# Patient Record
Sex: Female | Born: 1945 | Race: Black or African American | Hispanic: No | Smoking: Former smoker
Health system: Southern US, Community
[De-identification: ages and names within clinical notes are randomized; demographics above are authoritative.]

## PROBLEM LIST (undated history)

## (undated) DIAGNOSIS — K219 Gastro-esophageal reflux disease without esophagitis: Secondary | ICD-10-CM

## (undated) DIAGNOSIS — F5102 Adjustment insomnia: Secondary | ICD-10-CM

## (undated) DIAGNOSIS — E785 Hyperlipidemia, unspecified: Secondary | ICD-10-CM

## (undated) DIAGNOSIS — R000 Tachycardia, unspecified: Secondary | ICD-10-CM

## (undated) DIAGNOSIS — R7303 Prediabetes: Secondary | ICD-10-CM

## (undated) DIAGNOSIS — B9689 Other specified bacterial agents as the cause of diseases classified elsewhere: Secondary | ICD-10-CM

## (undated) DIAGNOSIS — N200 Calculus of kidney: Secondary | ICD-10-CM

## (undated) DIAGNOSIS — C50512 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 breast: Secondary | ICD-10-CM

## (undated) DIAGNOSIS — M810 Age-related osteoporosis without current pathological fracture: Secondary | ICD-10-CM

## (undated) DIAGNOSIS — L84 Corns and callosities: Secondary | ICD-10-CM

## (undated) DIAGNOSIS — I700 Atherosclerosis of aorta: Secondary | ICD-10-CM

## (undated) DIAGNOSIS — D1721 Benign lipomatous neoplasm of skin and subcutaneous tissue of right arm: Secondary | ICD-10-CM

## (undated) DIAGNOSIS — F419 Anxiety disorder, unspecified: Secondary | ICD-10-CM

## (undated) DIAGNOSIS — R591 Generalized enlarged lymph nodes: Secondary | ICD-10-CM

## (undated) DIAGNOSIS — I10 Essential (primary) hypertension: Secondary | ICD-10-CM

## (undated) DIAGNOSIS — J302 Other seasonal allergic rhinitis: Secondary | ICD-10-CM

## (undated) DIAGNOSIS — Q669 Congenital deformity of feet, unspecified, unspecified foot: Secondary | ICD-10-CM

## (undated) DIAGNOSIS — M1990 Unspecified osteoarthritis, unspecified site: Secondary | ICD-10-CM

## (undated) DIAGNOSIS — E119 Type 2 diabetes mellitus without complications: Secondary | ICD-10-CM

## (undated) DIAGNOSIS — Z923 Personal history of irradiation: Secondary | ICD-10-CM

## (undated) DIAGNOSIS — Z87442 Personal history of urinary calculi: Secondary | ICD-10-CM

## (undated) DIAGNOSIS — T7840XA Allergy, unspecified, initial encounter: Secondary | ICD-10-CM

## (undated) DIAGNOSIS — K635 Polyp of colon: Secondary | ICD-10-CM

## (undated) DIAGNOSIS — K222 Esophageal obstruction: Secondary | ICD-10-CM

## (undated) DIAGNOSIS — C50919 Malignant neoplasm of unspecified site of unspecified female breast: Secondary | ICD-10-CM

## (undated) HISTORY — DX: Unspecified osteoarthritis, unspecified site: M19.90

## (undated) HISTORY — DX: Allergy, unspecified, initial encounter: T78.40XA

## (undated) HISTORY — PX: INGUINAL HERNIA REPAIR: SUR1180

## (undated) HISTORY — DX: Hyperlipidemia, unspecified: E78.5

## (undated) HISTORY — PX: ABDOMINAL HYSTERECTOMY W/ PARTIAL VAGINACTOMY: SUR660

## (undated) HISTORY — DX: Malignant neoplasm of unspecified site of unspecified female breast: C50.919

## (undated) HISTORY — DX: Type 2 diabetes mellitus without complications: E11.9

## (undated) HISTORY — PX: POLYPECTOMY: SHX149

## (undated) HISTORY — PX: ABDOMINAL HYSTERECTOMY: SHX81

---

## 2006-11-18 ENCOUNTER — Ambulatory Visit: Payer: Self-pay

## 2006-11-27 ENCOUNTER — Ambulatory Visit: Payer: Self-pay

## 2007-06-02 ENCOUNTER — Ambulatory Visit: Payer: Self-pay

## 2007-11-19 ENCOUNTER — Emergency Department: Payer: Self-pay | Admitting: Emergency Medicine

## 2008-02-04 DIAGNOSIS — E7800 Pure hypercholesterolemia, unspecified: Secondary | ICD-10-CM | POA: Insufficient documentation

## 2009-01-31 ENCOUNTER — Ambulatory Visit: Payer: Self-pay

## 2010-05-08 ENCOUNTER — Ambulatory Visit: Payer: Self-pay

## 2011-05-12 ENCOUNTER — Ambulatory Visit: Payer: Self-pay | Admitting: Family Medicine

## 2012-05-27 ENCOUNTER — Ambulatory Visit: Payer: Self-pay | Admitting: Family Medicine

## 2013-04-11 DIAGNOSIS — Z87448 Personal history of other diseases of urinary system: Secondary | ICD-10-CM | POA: Insufficient documentation

## 2013-05-31 ENCOUNTER — Ambulatory Visit: Payer: Self-pay | Admitting: Family Medicine

## 2014-06-01 ENCOUNTER — Ambulatory Visit: Payer: Self-pay | Admitting: Family Medicine

## 2014-06-05 ENCOUNTER — Ambulatory Visit: Payer: Self-pay | Admitting: Family Medicine

## 2014-11-10 DIAGNOSIS — K635 Polyp of colon: Secondary | ICD-10-CM | POA: Insufficient documentation

## 2015-05-03 DIAGNOSIS — L84 Corns and callosities: Secondary | ICD-10-CM | POA: Insufficient documentation

## 2015-05-14 ENCOUNTER — Other Ambulatory Visit: Payer: Self-pay | Admitting: Family Medicine

## 2015-05-14 DIAGNOSIS — Z1231 Encounter for screening mammogram for malignant neoplasm of breast: Secondary | ICD-10-CM

## 2015-07-11 ENCOUNTER — Encounter: Payer: Self-pay | Admitting: *Deleted

## 2015-07-12 ENCOUNTER — Ambulatory Visit
Admission: RE | Admit: 2015-07-12 | Payer: Medicaid Other | Source: Ambulatory Visit | Admitting: Unknown Physician Specialty

## 2015-07-12 ENCOUNTER — Encounter: Admission: RE | Payer: Self-pay | Source: Ambulatory Visit

## 2015-07-12 HISTORY — DX: Essential (primary) hypertension: I10

## 2015-07-12 SURGERY — COLONOSCOPY
Anesthesia: General

## 2015-07-31 ENCOUNTER — Other Ambulatory Visit: Payer: Self-pay | Admitting: Family Medicine

## 2015-07-31 ENCOUNTER — Ambulatory Visit
Admission: RE | Admit: 2015-07-31 | Discharge: 2015-07-31 | Disposition: A | Discharge status: Home / Self Care | Payer: Medicare Other | Source: Ambulatory Visit | Attending: Family Medicine | Admitting: Family Medicine

## 2015-07-31 DIAGNOSIS — Z1231 Encounter for screening mammogram for malignant neoplasm of breast: Secondary | ICD-10-CM | POA: Diagnosis present

## 2015-08-07 ENCOUNTER — Other Ambulatory Visit: Payer: Self-pay | Admitting: Family Medicine

## 2015-08-07 DIAGNOSIS — Z13820 Encounter for screening for osteoporosis: Secondary | ICD-10-CM

## 2015-09-10 ENCOUNTER — Other Ambulatory Visit: Payer: Self-pay | Admitting: Family Medicine

## 2015-09-10 DIAGNOSIS — Z13820 Encounter for screening for osteoporosis: Secondary | ICD-10-CM

## 2015-09-14 ENCOUNTER — Other Ambulatory Visit: Payer: Self-pay | Admitting: Family Medicine

## 2015-09-14 DIAGNOSIS — Z13820 Encounter for screening for osteoporosis: Secondary | ICD-10-CM

## 2015-10-17 ENCOUNTER — Encounter: Payer: Self-pay | Admitting: *Deleted

## 2015-10-18 ENCOUNTER — Ambulatory Visit
Admission: RE | Admit: 2015-10-18 | Discharge: 2015-10-18 | Disposition: A | Discharge status: Home / Self Care | Payer: Medicare Other | Source: Ambulatory Visit | Attending: Unknown Physician Specialty | Admitting: Unknown Physician Specialty

## 2015-10-18 ENCOUNTER — Ambulatory Visit: Payer: Medicare Other | Admitting: Anesthesiology

## 2015-10-18 ENCOUNTER — Encounter: Payer: Self-pay | Admitting: *Deleted

## 2015-10-18 ENCOUNTER — Encounter
Admission: RE | Disposition: A | Discharge status: Home / Self Care | Payer: Self-pay | Source: Ambulatory Visit | Attending: Unknown Physician Specialty

## 2015-10-18 DIAGNOSIS — K640 First degree hemorrhoids: Secondary | ICD-10-CM | POA: Insufficient documentation

## 2015-10-18 DIAGNOSIS — D123 Benign neoplasm of transverse colon: Secondary | ICD-10-CM | POA: Diagnosis not present

## 2015-10-18 DIAGNOSIS — Z79899 Other long term (current) drug therapy: Secondary | ICD-10-CM | POA: Diagnosis not present

## 2015-10-18 DIAGNOSIS — Z86010 Personal history of colonic polyps: Secondary | ICD-10-CM | POA: Diagnosis present

## 2015-10-18 DIAGNOSIS — D122 Benign neoplasm of ascending colon: Secondary | ICD-10-CM | POA: Diagnosis not present

## 2015-10-18 DIAGNOSIS — I10 Essential (primary) hypertension: Secondary | ICD-10-CM | POA: Insufficient documentation

## 2015-10-18 DIAGNOSIS — K5730 Diverticulosis of large intestine without perforation or abscess without bleeding: Secondary | ICD-10-CM | POA: Diagnosis not present

## 2015-10-18 HISTORY — DX: Other seasonal allergic rhinitis: J30.2

## 2015-10-18 HISTORY — PX: COLONOSCOPY WITH PROPOFOL: SHX5780

## 2015-10-18 SURGERY — COLONOSCOPY WITH PROPOFOL
Anesthesia: General

## 2015-10-18 MED ORDER — PROPOFOL 10 MG/ML IV BOLUS
INTRAVENOUS | Status: DC | PRN
  Administered 2015-10-18: 10 mg via INTRAVENOUS
  Administered 2015-10-18: 30 mg via INTRAVENOUS

## 2015-10-18 MED ORDER — MIDAZOLAM HCL 5 MG/5ML IJ SOLN
INTRAMUSCULAR | Status: DC | PRN
  Administered 2015-10-18: 1 mg via INTRAVENOUS
  Administered 2015-10-18 (×2): 0.5 mg via INTRAVENOUS

## 2015-10-18 MED ORDER — SODIUM CHLORIDE 0.9 % IV SOLN
INTRAVENOUS | Status: DC
  Administered 2015-10-18: 1000 mL via INTRAVENOUS

## 2015-10-18 MED ORDER — SODIUM CHLORIDE 0.9 % IV SOLN: INTRAVENOUS | Status: DC

## 2015-10-18 MED ORDER — EPHEDRINE SULFATE 50 MG/ML IJ SOLN
INTRAMUSCULAR | Status: DC | PRN
  Administered 2015-10-18: 5 mg via INTRAVENOUS
  Administered 2015-10-18: 10 mg via INTRAVENOUS

## 2015-10-18 MED ORDER — PROPOFOL 500 MG/50ML IV EMUL
INTRAVENOUS | Status: DC | PRN
  Administered 2015-10-18: 100 ug/kg/min via INTRAVENOUS

## 2015-10-18 MED ORDER — LIDOCAINE HCL (PF) 2 % IJ SOLN
INTRAMUSCULAR | Status: DC | PRN
  Administered 2015-10-18: 50 mg

## 2015-10-18 MED ORDER — FENTANYL CITRATE (PF) 100 MCG/2ML IJ SOLN
INTRAMUSCULAR | Status: DC | PRN
  Administered 2015-10-18: 50 ug via INTRAVENOUS

## 2015-10-18 NOTE — H&P (Signed)
   Primary Care Physician:  Marguerita Merles, MD Primary Gastroenterologist:  Dr. Vira Agar  Pre-Procedure History & Physical: HPI:  Virginia Warner is a 69 y.o. female is here for an colonoscopy.   Past Medical History  Diagnosis Date  . Hypertension   . Seasonal allergies     Past Surgical History  Procedure Laterality Date  . Abdominal hysterectomy    . Polypectomy      Prior to Admission medications   Medication Sig Start Date End Date Taking? Authorizing Provider  amLODipine (NORVASC) 5 MG tablet Take 5 mg by mouth daily.   Yes Historical Provider, MD  aspirin EC 81 MG tablet Take 81 mg by mouth daily.   Yes Historical Provider, MD  fluticasone (FLONASE) 50 MCG/ACT nasal spray Place into both nostrils daily.   Yes Historical Provider, MD  geriatric multivitamins-minerals (ELDERTONIC/GEVRABON) ELIX Take 15 mLs by mouth daily.   Yes Historical Provider, MD  losartan-hydrochlorothiazide (HYZAAR) 50-12.5 MG per tablet Take 1 tablet by mouth daily.   Yes Historical Provider, MD    Allergies as of 08/20/2015 - Review Complete 07/31/2015  Allergen Reaction Noted  . Aspirin  07/11/2015  . Atorvastatin  07/11/2015    History reviewed. No pertinent family history.  Social History   Social History  . Marital Status: Divorced    Spouse Name: N/A  . Number of Children: N/A  . Years of Education: N/A   Occupational History  . Not on file.   Social History Main Topics  . Smoking status: Not on file  . Smokeless tobacco: Not on file  . Alcohol Use: Not on file  . Drug Use: Not on file  . Sexual Activity: Not on file   Other Topics Concern  . Not on file   Social History Narrative    Review of Systems: See HPI, otherwise negative ROS  Physical Exam: BP 141/76 mmHg  Pulse 105  Temp(Src) 99.5 F (37.5 C) (Tympanic)  Resp 16  Ht 5\' 5"  (1.651 m)  Wt 70.308 kg (155 lb)  BMI 25.79 kg/m2  SpO2 98% General:   Alert,  pleasant and cooperative in NAD Head:  Normocephalic  and atraumatic. Neck:  Supple; no masses or thyromegaly. Lungs:  Clear throughout to auscultation.    Heart:  Regular rate and rhythm. Abdomen:  Soft, nontender and nondistended. Normal bowel sounds, without guarding, and without rebound.   Neurologic:  Alert and  oriented x4;  grossly normal neurologically.  Impression/Plan: Virginia Warner is here for an colonoscopy to be performed for [facility] colon polyps  Risks, benefits, limitations, and alternatives regarding  colonoscopy have been reviewed with the patient.  Questions have been answered.  All parties agreeable.   Gaylyn Cheers, MD  10/18/2015, 2:03 PM

## 2015-10-18 NOTE — Anesthesia Preprocedure Evaluation (Signed)
Anesthesia Evaluation  Patient identified by MRN, date of birth, ID band Patient awake    Reviewed: Allergy & Precautions, H&P , NPO status , Patient's Chart, lab work & pertinent test results  History of Anesthesia Complications Negative for: history of anesthetic complications  Airway Mallampati: III  TM Distance: >3 FB Neck ROM: limited    Dental  (+) Poor Dentition, Missing, Upper Dentures   Pulmonary neg pulmonary ROS,    Pulmonary exam normal breath sounds clear to auscultation       Cardiovascular Exercise Tolerance: Good hypertension, (-) angina(-) Past MI and (-) DOE Normal cardiovascular exam Rhythm:regular Rate:Normal     Neuro/Psych negative neurological ROS  negative psych ROS   GI/Hepatic negative GI ROS, Neg liver ROS, neg GERD  ,  Endo/Other  negative endocrine ROS  Renal/GU negative Renal ROS  negative genitourinary   Musculoskeletal   Abdominal   Peds  Hematology negative hematology ROS (+)   Anesthesia Other Findings Past Medical History:   Hypertension                                                 Seasonal allergies                                          Past Surgical History:   ABDOMINAL HYSTERECTOMY                                        POLYPECTOMY                                                  BMI    Body Mass Index   25.79 kg/m 2      Reproductive/Obstetrics negative OB ROS                             Anesthesia Physical Anesthesia Plan  ASA: III  Anesthesia Plan: General   Post-op Pain Management:    Induction:   Airway Management Planned:   Additional Equipment:   Intra-op Plan:   Post-operative Plan:   Informed Consent: I have reviewed the patients History and Physical, chart, labs and discussed the procedure including the risks, benefits and alternatives for the proposed anesthesia with the patient or authorized representative who  has indicated his/her understanding and acceptance.   Dental Advisory Given  Plan Discussed with: Anesthesiologist, CRNA and Surgeon  Anesthesia Plan Comments:         Anesthesia Quick Evaluation

## 2015-10-18 NOTE — Anesthesia Postprocedure Evaluation (Signed)
  Anesthesia Post-op Note  Patient: Virginia Warner  Procedure(s) Performed: Procedure(s): COLONOSCOPY WITH PROPOFOL (N/A)  Anesthesia type:General  Patient location: PACU  Post pain: Pain level controlled  Post assessment: Post-op Vital signs reviewed, Patient's Cardiovascular Status Stable, Respiratory Function Stable, Patent Airway and No signs of Nausea or vomiting  Post vital signs: Reviewed and stable  Last Vitals:  Filed Vitals:   10/18/15 1510  BP: 106/63  Pulse: 81  Temp:   Resp: 13    Level of consciousness: awake, alert  and patient cooperative  Complications: No apparent anesthesia complications

## 2015-10-18 NOTE — Transfer of Care (Signed)
Immediate Anesthesia Transfer of Care Note  Patient: Virginia Warner  Procedure(s) Performed: Procedure(s): COLONOSCOPY WITH PROPOFOL (N/A)  Patient Location: PACU  Anesthesia Type:General  Level of Consciousness: sedated  Airway & Oxygen Therapy: Patient Spontanous Breathing and Patient connected to nasal cannula oxygen  Post-op Assessment: Report given to RN and Post -op Vital signs reviewed and stable  Post vital signs: Reviewed and stable  Last Vitals:  Filed Vitals:   10/18/15 1311  BP: 141/76  Pulse: 105  Temp: 37.5 C  Resp: 16    Complications: No apparent anesthesia complications

## 2015-10-18 NOTE — Op Note (Signed)
[facility] Gastroenterology Patient Name: Virginia Warner Procedure Date: 10/18/2015 2:03 PM MRN: [id number] Account #: [id number] Date of Birth: September 30, 1946 Admit Type: Outpatient Age: 69 Room: [facility] ENDO ROOM 1 Gender: Female Note Status: Finalized Procedure:         Colonoscopy Indications:       High risk colon cancer surveillance: Personal history of                     colonic polyps Providers:         Manya Silvas, MD Referring MD:      Marguerita Merles, MD (Referring MD) Medicines:         Propofol per Anesthesia Complications:     No immediate complications. Procedure:         Pre-Anesthesia Assessment:                    - After reviewing the risks and benefits, the patient was                     deemed in satisfactory condition to undergo the procedure.                    After obtaining informed consent, the colonoscope was                     passed under direct vision. Throughout the procedure, the                     patient's blood pressure, pulse, and oxygen saturations                     were monitored continuously. The Olympus PCF-H180AL                     colonoscope ( S#: [id number] ) was introduced through the                     anus and advanced to the the cecum, identified by                     appendiceal orifice and ileocecal valve. The colonoscopy                     was performed without difficulty. The patient tolerated                     the procedure well. The quality of the bowel preparation                     was good. Findings:      Two sessile polyps were found in the ascending colon. The polyps were       small in size. These polyps were removed with a jumbo cold forceps.       Resection and retrieval were complete.      A diminutive polyp was found at the hepatic flexure. The polyp was       sessile. The polyp was removed with a jumbo cold forceps. Resection and       retrieval were complete.      Many small-mouthed  diverticula were found in the sigmoid colon and in       the descending colon.      Internal hemorrhoids were found during endoscopy. The hemorrhoids were  small and Grade I (internal hemorrhoids that do not prolapse).      The exam was otherwise without abnormality. Impression:        - Two small polyps in the ascending colon. Resected and                     retrieved.                    - One diminutive polyp at the hepatic flexure. Resected                     and retrieved.                    - Diverticulosis in the sigmoid colon and in the                     descending colon.                    - Internal hemorrhoids.                    - The examination was otherwise normal. Recommendation:    - Await pathology results. Manya Silvas, MD 10/18/2015 2:39:35 PM This report has been signed electronically. Number of Addenda: 0 Note Initiated On: 10/18/2015 2:03 PM Scope Withdrawal Time: 0 hours 13 minutes 17 seconds  Total Procedure Duration: 0 hours 27 minutes 59 seconds       [facility]

## 2015-10-19 ENCOUNTER — Encounter: Payer: Self-pay | Admitting: Unknown Physician Specialty

## 2015-10-22 LAB — SURGICAL PATHOLOGY

## 2015-12-12 ENCOUNTER — Other Ambulatory Visit: Payer: Self-pay | Admitting: Family Medicine

## 2015-12-12 DIAGNOSIS — Z13820 Encounter for screening for osteoporosis: Secondary | ICD-10-CM

## 2016-01-04 DIAGNOSIS — Q6689 Other  specified congenital deformities of feet: Secondary | ICD-10-CM | POA: Insufficient documentation

## 2016-02-05 ENCOUNTER — Encounter: Payer: Self-pay | Admitting: Sports Medicine

## 2016-02-05 ENCOUNTER — Ambulatory Visit (INDEPENDENT_AMBULATORY_CARE_PROVIDER_SITE_OTHER): Payer: Medicare Other | Admitting: Sports Medicine

## 2016-02-05 DIAGNOSIS — L909 Atrophic disorder of skin, unspecified: Secondary | ICD-10-CM

## 2016-02-05 DIAGNOSIS — R29898 Other symptoms and signs involving the musculoskeletal system: Secondary | ICD-10-CM

## 2016-02-05 DIAGNOSIS — L603 Nail dystrophy: Secondary | ICD-10-CM | POA: Diagnosis not present

## 2016-02-05 DIAGNOSIS — M898X9 Other specified disorders of bone, unspecified site: Secondary | ICD-10-CM | POA: Diagnosis not present

## 2016-02-05 NOTE — Progress Notes (Signed)
Patient ID: Virginia Warner, female   DOB: Jan 25, 1946, 70 y.o.   MRN: [id number] Subjective: Virginia Warner is a 70 y.o. female patient seen today in office for advice on appearance of bilateral hallux nails, right foot bump, and fat pad feeling like its bunching up that is painless or symptom free. Patient denies history of Diabetes, Neuropathy, or Vascular disease. Patient has no other pedal complaints at this time.   Review of Systems  All other systems reviewed and are negative.  There are no active problems to display for this patient.  Current Outpatient Prescriptions on File Prior to Visit  Medication Sig Dispense Refill  . amLODipine (NORVASC) 5 MG tablet Take 5 mg by mouth daily.    Marland Kitchen aspirin EC 81 MG tablet Take 81 mg by mouth daily.    . fluticasone (FLONASE) 50 MCG/ACT nasal spray Place into both nostrils daily.    Marland Kitchen geriatric multivitamins-minerals (ELDERTONIC/GEVRABON) ELIX Take 15 mLs by mouth daily.    Marland Kitchen losartan-hydrochlorothiazide (HYZAAR) 50-12.5 MG per tablet Take 1 tablet by mouth daily.     No current facility-administered medications on file prior to visit.   Allergies  Allergen Reactions  . Aspirin Other (See Comments)    Cannot tolerate unless enteric coated  . Atorvastatin Other (See Comments)   Objective: Physical Exam  General: Well developed, nourished, no acute distress, awake, alert and oriented x 3  Vascular: Dorsalis pedis artery 2/4 bilateral, Posterior tibial artery 2/4 bilateral, skin temperature warm to warm proximal to distal bilateral lower extremities, no varicosities, pedal hair present bilateral.  Neurological: Gross sensation present via light touch bilateral.   Dermatological: Skin is warm, dry, and supple bilateral, Nails 1-10 are short mildly thick, and discolored with mild subungal debris at bilateral hallux nails, all other nails within normal limits, no webspace macerations present bilateral, no open lesions present bilateral, no  callus/corns/hyperkeratotic tissue present bilateral. No signs of infection bilateral.  Musculoskeletal: + midfoot bony exostosis on right with no pain to palpation and fat pad atrophy bilateral that is also asymptomatic. Muscular strength within normal limits without pain or limitation on range of motion. No pain with calf compression bilateral.  Assessment and Plan:  Problem List Items Addressed This Visit    None    Visit Diagnoses    Nail dystrophy    -  Primary    Fat pad atrophy of foot        asymptomatic    Bony exostosis        Right dorsal midfoot      -Examined patient.  -Discussed treatment options for dystrophic nails; patient states that she just wanted to make sure that it's not harmful and wants to continue with pedicures. Advised patient to be careful and to choose a spa that is sanitary and with trained technicians  -Recommend good supportive shoes and inserts daily for foot type and alternate lacing to avoid irritation at exostosis site on right -Patient to return in as needed for follow up evaluation or sooner if symptoms worsen.  Landis Martins, DPM

## 2016-02-05 NOTE — Progress Notes (Deleted)
   Subjective:    Patient ID: Virginia Warner, female    DOB: Sep 09, 1946, 70 y.o.   MRN: [id number]  HPI    Review of Systems  All other systems reviewed and are negative.      Objective:   Physical Exam        Assessment & Plan:

## 2016-02-05 NOTE — Patient Instructions (Signed)
[employer]

## 2016-07-16 ENCOUNTER — Other Ambulatory Visit: Payer: Self-pay | Admitting: Family Medicine

## 2016-07-16 DIAGNOSIS — Z1231 Encounter for screening mammogram for malignant neoplasm of breast: Secondary | ICD-10-CM

## 2016-07-31 ENCOUNTER — Ambulatory Visit: Payer: Medicare Other

## 2016-08-15 ENCOUNTER — Other Ambulatory Visit: Payer: Self-pay | Admitting: Family Medicine

## 2016-08-15 ENCOUNTER — Ambulatory Visit
Admission: RE | Admit: 2016-08-15 | Discharge: 2016-08-15 | Disposition: A | Discharge status: Home / Self Care | Payer: Medicare Other | Source: Ambulatory Visit | Attending: Family Medicine | Admitting: Family Medicine

## 2016-08-15 DIAGNOSIS — Z1231 Encounter for screening mammogram for malignant neoplasm of breast: Secondary | ICD-10-CM | POA: Insufficient documentation

## 2017-02-03 DIAGNOSIS — Z1389 Encounter for screening for other disorder: Secondary | ICD-10-CM | POA: Diagnosis not present

## 2017-02-03 DIAGNOSIS — Z0000 Encounter for general adult medical examination without abnormal findings: Secondary | ICD-10-CM | POA: Diagnosis not present

## 2017-02-04 ENCOUNTER — Other Ambulatory Visit: Payer: Self-pay | Admitting: Family Medicine

## 2017-02-04 DIAGNOSIS — Z13820 Encounter for screening for osteoporosis: Secondary | ICD-10-CM

## 2017-03-05 DIAGNOSIS — I10 Essential (primary) hypertension: Secondary | ICD-10-CM | POA: Diagnosis not present

## 2017-03-05 DIAGNOSIS — E782 Mixed hyperlipidemia: Secondary | ICD-10-CM | POA: Diagnosis not present

## 2017-03-17 ENCOUNTER — Ambulatory Visit: Payer: Medicare Other

## 2017-03-17 DIAGNOSIS — H40153 Residual stage of open-angle glaucoma, bilateral: Secondary | ICD-10-CM | POA: Diagnosis not present

## 2017-04-27 ENCOUNTER — Other Ambulatory Visit: Payer: Medicare Other

## 2017-07-09 DIAGNOSIS — I10 Essential (primary) hypertension: Secondary | ICD-10-CM | POA: Diagnosis not present

## 2017-07-09 DIAGNOSIS — E782 Mixed hyperlipidemia: Secondary | ICD-10-CM | POA: Diagnosis not present

## 2017-07-30 ENCOUNTER — Other Ambulatory Visit: Payer: Self-pay | Admitting: Family Medicine

## 2017-07-30 DIAGNOSIS — Z1231 Encounter for screening mammogram for malignant neoplasm of breast: Secondary | ICD-10-CM

## 2017-08-04 ENCOUNTER — Ambulatory Visit: Payer: Medicare Other

## 2017-08-24 ENCOUNTER — Encounter: Payer: Self-pay | Admitting: Medical Oncology

## 2017-08-24 ENCOUNTER — Emergency Department
Admission: EM | Admit: 2017-08-24 | Discharge: 2017-08-24 | Disposition: A | Discharge status: Home / Self Care | Payer: Medicare Other | Attending: Emergency Medicine | Admitting: Emergency Medicine

## 2017-08-24 DIAGNOSIS — I10 Essential (primary) hypertension: Secondary | ICD-10-CM | POA: Insufficient documentation

## 2017-08-24 DIAGNOSIS — J0100 Acute maxillary sinusitis, unspecified: Secondary | ICD-10-CM | POA: Diagnosis not present

## 2017-08-24 DIAGNOSIS — Z79899 Other long term (current) drug therapy: Secondary | ICD-10-CM | POA: Diagnosis not present

## 2017-08-24 DIAGNOSIS — R040 Epistaxis: Secondary | ICD-10-CM | POA: Diagnosis not present

## 2017-08-24 DIAGNOSIS — Z7982 Long term (current) use of aspirin: Secondary | ICD-10-CM | POA: Diagnosis not present

## 2017-08-24 MED ORDER — AZITHROMYCIN 250 MG PO TABS: ORAL_TABLET | ORAL | 0 refills | Status: AC

## 2017-08-24 NOTE — ED Provider Notes (Signed)
[facility] Emergency Department Provider Note   ____________________________________________   I have reviewed the triage vital signs and the nursing notes.   HISTORY  Chief Complaint Epistaxis     Virginia Warner is a 71 y.o. female Presents to the emergency department with maxillary sinus pressure and left nostril nosebleed. Patient reports using Flonase regularly. She notes since beginning Flonase the nostrils have become dry and  Irritated which resulted in more frequent nosebleeds. Over the last day patient has transitioned to saline nasal spray. Patient denies being on aspirin or blood thinners contributing to nosebleeds. Patient has a long history of seasonal allergies. Patient denies fever, chills, headache, vision changes, chest pain, chest tightness, shortness of breath, abdominal pain, nausea and vomiting.  Past Medical History:  Diagnosis Date  . Hypertension   . Seasonal allergies     There are no active problems to display for this patient.   Past Surgical History:  Procedure Laterality Date  . ABDOMINAL HYSTERECTOMY    . COLONOSCOPY WITH PROPOFOL N/A 10/18/2015   Procedure: COLONOSCOPY WITH PROPOFOL;  Surgeon: Manya Silvas, MD;  Location: [facility] ENDOSCOPY;  Service: Endoscopy;  Laterality: N/A;  . POLYPECTOMY      Prior to Admission medications   Medication Sig Start Date End Date Taking? Authorizing Provider  amLODipine (NORVASC) 5 MG tablet Take 5 mg by mouth daily.    [provider]  aspirin EC 81 MG tablet Take 81 mg by mouth daily.    [provider]  azithromycin (ZITHROMAX Z-PAK) 250 MG tablet Take 2 tablets (500 mg) on  Day 1,  followed by 1 tablet (250 mg) once daily on Days 2 through 5. 08/24/17 08/29/17  Meena Barrantes M, PA-C  fluticasone (FLONASE) 50 MCG/ACT nasal spray Place into both nostrils daily.    [provider]  geriatric multivitamins-minerals (ELDERTONIC/GEVRABON) ELIX Take 15 mLs by  mouth daily.    [provider]  losartan-hydrochlorothiazide (HYZAAR) 50-12.5 MG per tablet Take 1 tablet by mouth daily.    [provider]  polyethylene glycol powder (GLYCOLAX/MIRALAX) powder Take as directed for colon prep 01/29/15   [provider]    Allergies Aspirin and Atorvastatin  Family History  Problem Relation Age of Onset  . Cancer Brother     Social History Social History  Substance Use Topics  . Smoking status: Unknown If Ever Smoked  . Smokeless tobacco: Never Used  . Alcohol use Not on file    Review of Systems Constitutional: Negative for fever/chills Eyes: No visual changes. ENT:  Negative for sore throat and for difficulty swallowing. Left [place] nosebleed. Cardiovascular: Denies chest pain. Respiratory: Denies cough. Denies shortness of breath. Gastrointestinal: No abdominal pain.  No nausea, vomiting, diarrhea. Genitourinary: Negative for dysuria. Musculoskeletal: Negative for back pain. Skin: Negative for rash. Neurological: Negative for headaches.  Negative focal weakness or numbness. Negative for loss of consciousness. Able to ambulate. ____________________________________________   PHYSICAL EXAM:  VITAL SIGNS: ED Triage Vitals  Enc Vitals Group     BP 08/24/17 0847 (!) 168/97     Pulse Rate 08/24/17 0847 (!) 110     Resp 08/24/17 0847 18     Temp 08/24/17 0847 98.8 F (37.1 C)     Temp Source 08/24/17 0847 Oral     SpO2 08/24/17 0847 99 %     Weight 08/24/17 0848 140 lb (63.5 kg)     Height 08/24/17 0848 5\' 5"  (1.651 m)  Head Circumference --      Peak Flow --      Pain Score 08/24/17 1112 0     Pain Loc --      Pain Edu? --      Excl. in [place]? --     Constitutional: Alert and oriented. Well appearing and in no acute distress.  Eyes: Conjunctivae are normal. PERRL. EOMI  Head: Normocephalic and atraumatic. ENT:      Ears: Canals clear. TMs intact bilaterally.      Nose: No congestion/rhinnorhea. Left  nostril with dried blood. Mucosa erythematous and abraded from recent bleeding. Now hemostatic      Mouth/Throat: Mucous membranes are moist.  Neck:Supple. No thyromegaly. No stridor.  Cardiovascular: Normal rate, regular rhythm. Normal S1 and S2.  Good peripheral circulation. Respiratory: Normal respiratory effort without tachypnea or retractions. Lungs CTAB. No wheezes/rales/rhonchi. Good air entry to the bases with no decreased or absent breath sounds. Hematological/Lymphatic/Immunological: No cervical lymphadenopathy. Cardiovascular: Normal rate, regular rhythm. Normal distal pulses. Gastrointestinal: Bowel sounds 4 quadrants. Soft and nontender to palpation. No guarding or rigidity. No palpable masses. No distention. No CVA tenderness. Musculoskeletal: Nontender with normal range of motion in all extremities. Neurologic: Normal speech and language.  Skin:  Skin is warm, dry and intact. No rash noted. Psychiatric: Mood and affect are normal. Speech and behavior are normal. Patient exhibits appropriate insight and judgement.  ____________________________________________   LABS (all labs ordered are listed, but only abnormal results are displayed)  Labs Reviewed - No data to display ____________________________________________  EKG none ____________________________________________  RADIOLOGY none ____________________________________________   PROCEDURES  Procedure(s) performed: no    Critical Care performed: no ____________________________________________   INITIAL IMPRESSION / ASSESSMENT AND PLAN / ED COURSE  Pertinent labs & imaging results that were available during my care of the patient were reviewed by me and considered in my medical decision making (see chart for details).  Patient presents to emergency department with left nostril epistaxis and maxillary sinus pressure. History and physical exam findings are reassuring no further bleeding is present. Symptoms  are consistent with nasal irritation from nasal sinus products, in addition to, maxillary sinus pressure associated likely sinusitis. Patient will be given a prescription for azithromycin for an about a coverage and encourage to continue saline nasal to moisturize the nasal mucosa. Patient informed of clinical course, understand medical decision-making process, and agree with plan. Patient was advised to follow up with PCP and was also advised to return to the emergency department for symptoms that change or worsen.    ____________________________________________   FINAL CLINICAL IMPRESSION(S) / ED DIAGNOSES  Final diagnoses:  Epistaxis  Acute maxillary sinusitis, recurrence not specified       NEW MEDICATIONS STARTED DURING THIS VISIT:  Discharge Medication List as of 08/24/2017 11:06 AM    START taking these medications   Details  azithromycin (ZITHROMAX Z-PAK) 250 MG tablet Take 2 tablets (500 mg) on  Day 1,  followed by 1 tablet (250 mg) once daily on Days 2 through 5., Print         Note:  This document was prepared using Dragon voice recognition software and may include unintentional dictation errors.    Alric Quan 08/24/17 1630    Lavonia Drafts, MD 08/25/17 [phone]

## 2017-08-24 NOTE — ED Notes (Signed)
See triage note  States she has been having sinus drainage and pressure for several days  States she use her nasal spray and developed a nose bleed  No bleeding at present

## 2017-08-24 NOTE — ED Triage Notes (Signed)
Pt reports she has been having lots of sinus drainage and this am she used a nasal spray and afterwards had a left sided nose bleed. Bleeding has now stopped. Pt denies use of blood thinner.

## 2017-08-24 NOTE — Discharge Instructions (Signed)
Take medication as prescribed. Return to emergency department if symptoms worsen and follow-up with PCP as needed.   °

## 2017-09-14 DIAGNOSIS — E782 Mixed hyperlipidemia: Secondary | ICD-10-CM | POA: Diagnosis not present

## 2017-09-14 DIAGNOSIS — I10 Essential (primary) hypertension: Secondary | ICD-10-CM | POA: Diagnosis not present

## 2017-09-15 ENCOUNTER — Other Ambulatory Visit: Payer: Medicare Other

## 2017-09-23 DIAGNOSIS — H40153 Residual stage of open-angle glaucoma, bilateral: Secondary | ICD-10-CM | POA: Diagnosis not present

## 2017-10-21 ENCOUNTER — Other Ambulatory Visit: Payer: Medicare Other

## 2017-10-23 DIAGNOSIS — Z23 Encounter for immunization: Secondary | ICD-10-CM | POA: Diagnosis not present

## 2017-11-23 ENCOUNTER — Other Ambulatory Visit: Payer: Medicare Other

## 2017-11-23 ENCOUNTER — Ambulatory Visit
Admission: RE | Admit: 2017-11-23 | Discharge: 2017-11-23 | Disposition: A | Discharge status: Home / Self Care | Payer: Medicare Other | Source: Ambulatory Visit | Attending: Family Medicine | Admitting: Family Medicine

## 2017-11-23 DIAGNOSIS — Z1231 Encounter for screening mammogram for malignant neoplasm of breast: Secondary | ICD-10-CM | POA: Insufficient documentation

## 2017-12-14 ENCOUNTER — Ambulatory Visit: Payer: Self-pay | Admitting: Nurse Practitioner

## 2017-12-16 ENCOUNTER — Other Ambulatory Visit: Payer: Self-pay | Admitting: Nurse Practitioner

## 2017-12-16 MED ORDER — AMLODIPINE BESYLATE 5 MG PO TABS: 5.0000 mg | ORAL_TABLET | Freq: Every day | ORAL | 1 refills | Status: DC

## 2017-12-31 ENCOUNTER — Other Ambulatory Visit: Payer: Self-pay

## 2017-12-31 MED ORDER — LISINOPRIL-HYDROCHLOROTHIAZIDE 20-12.5 MG PO TABS: 1.0000 | ORAL_TABLET | Freq: Two times a day (BID) | ORAL | 3 refills | Status: DC

## 2018-01-18 ENCOUNTER — Ambulatory Visit (INDEPENDENT_AMBULATORY_CARE_PROVIDER_SITE_OTHER): Payer: Medicare Other | Admitting: Nurse Practitioner

## 2018-01-18 ENCOUNTER — Other Ambulatory Visit: Payer: Self-pay

## 2018-01-18 ENCOUNTER — Encounter: Payer: Self-pay | Admitting: Nurse Practitioner

## 2018-01-18 VITALS — BP 152/96 | HR 91 | Resp 16 | Ht 65.0 in | Wt 149.0 lb

## 2018-01-18 DIAGNOSIS — J019 Acute sinusitis, unspecified: Secondary | ICD-10-CM

## 2018-01-18 DIAGNOSIS — E782 Mixed hyperlipidemia: Secondary | ICD-10-CM | POA: Diagnosis not present

## 2018-01-18 DIAGNOSIS — I10 Essential (primary) hypertension: Secondary | ICD-10-CM | POA: Insufficient documentation

## 2018-01-18 MED ORDER — AZITHROMYCIN 250 MG PO TABS: ORAL_TABLET | ORAL | 0 refills | Status: DC

## 2018-01-18 MED ORDER — LOVASTATIN 20 MG PO TABS: 20.0000 mg | ORAL_TABLET | Freq: Every day | ORAL | 3 refills | Status: DC

## 2018-01-18 MED ORDER — LOSARTAN POTASSIUM-HCTZ 50-12.5 MG PO TABS: 1.0000 | ORAL_TABLET | Freq: Every day | ORAL | 3 refills | Status: DC

## 2018-01-18 MED ORDER — LISINOPRIL-HYDROCHLOROTHIAZIDE 20-12.5 MG PO TABS: 1.0000 | ORAL_TABLET | Freq: Two times a day (BID) | ORAL | 3 refills | Status: DC

## 2018-01-18 MED ORDER — AMLODIPINE BESYLATE 5 MG PO TABS: 5.0000 mg | ORAL_TABLET | Freq: Every day | ORAL | 3 refills | Status: DC

## 2018-01-18 NOTE — Progress Notes (Signed)
[facility] [place], [place] [address]  Internal MEDICINE  Office Visit Note  Patient Name: Virginia Warner  [id number]  [id number]  Date of Service: 01/18/2018  No chief complaint on file.   Hypertension  This is a chronic problem. The current episode started more than 1 month ago. The problem has been gradually improving since onset. The problem is controlled. Associated symptoms include headaches. There are no associated agents to hypertension. Risk factors for coronary artery disease include smoking/tobacco exposure. Past treatments include calcium channel blockers. The current treatment provides mild improvement. There are no compliance problems.   URI   The current episode started in the past 7 days. The problem has been unchanged. There has been no fever. Associated symptoms include congestion, coughing, headaches, rhinorrhea and a sore throat. Pertinent negatives include no nausea or vomiting. She has tried antihistamine (OTC cough suppressant.) for the symptoms. The treatment provided mild relief.    Pt is here for routine follow up.    Current Medication: Outpatient Encounter Medications as of 01/18/2018  Medication Sig Note  . amLODipine (NORVASC) 5 MG tablet Take 1 tablet (5 mg total) by mouth daily.   Marland Kitchen aspirin EC 81 MG tablet Take 81 mg by mouth daily.   . fluticasone (FLONASE) 50 MCG/ACT nasal spray Place into both nostrils daily.   Marland Kitchen geriatric multivitamins-minerals (ELDERTONIC/GEVRABON) ELIX Take 15 mLs by mouth daily.   Marland Kitchen latanoprost (XALATAN) 0.005 % ophthalmic solution 1 drop at bedtime.   Marland Kitchen lisinopril-hydrochlorothiazide (PRINZIDE,ZESTORETIC) 20-12.5 MG tablet Take 1 tablet by mouth 2 (two) times daily.   Marland Kitchen lovastatin (MEVACOR) 20 MG tablet Take 20 mg by mouth at bedtime.   Marland Kitchen losartan-hydrochlorothiazide (HYZAAR) 50-12.5 MG per tablet Take 1 tablet by mouth daily.   . polyethylene glycol powder (GLYCOLAX/MIRALAX) powder Take as directed for  colon prep 02/05/2016: Received from: [place]   No facility-administered encounter medications on file as of 01/18/2018.     Surgical History: Past Surgical History:  Procedure Laterality Date  . ABDOMINAL HYSTERECTOMY    . COLONOSCOPY WITH PROPOFOL N/A 10/18/2015   Procedure: COLONOSCOPY WITH PROPOFOL;  Surgeon: Manya Silvas, MD;  Location: [facility] ENDOSCOPY;  Service: Endoscopy;  Laterality: N/A;  . POLYPECTOMY      Medical History: Past Medical History:  Diagnosis Date  . Hyperlipidemia   . Hypertension   . Seasonal allergies     Family History: Family History  Problem Relation Age of Onset  . Cancer Brother   . Breast cancer Neg Hx     Social History   Socioeconomic History  . Marital status: Divorced    Spouse name: Not on file  . Number of children: Not on file  . Years of education: Not on file  . Highest education level: Not on file  Social Needs  . Financial resource strain: Not on file  . Food insecurity - worry: Not on file  . Food insecurity - inability: Not on file  . Transportation needs - medical: Not on file  . Transportation needs - non-medical: Not on file  Occupational History  . Not on file  Tobacco Use  . Smoking status: Former Research scientist (life sciences)  . Smokeless tobacco: Never Used  Substance and Sexual Activity  . Alcohol use: Yes    Alcohol/week: 0.0 oz    Comment: ocassionally   . Drug use: No  . Sexual activity: Not on file  Other Topics Concern  . Not on file  Social  History Narrative  . Not on file      Review of Systems  Constitutional: Negative for fatigue and fever.  HENT: Positive for congestion, rhinorrhea, sore throat and voice change.   Eyes: Negative.   Respiratory: Positive for cough.   Cardiovascular:       Blood pressure generally stable.   Gastrointestinal: Negative for constipation, nausea and vomiting.  Endocrine: Negative for cold intolerance, heat intolerance, polydipsia, polyphagia and polyuria.   Genitourinary: Negative for flank pain.  Musculoskeletal: Negative.   Allergic/Immunologic: Positive for environmental allergies.  Neurological: Positive for headaches.  Hematological: Negative.   Psychiatric/Behavioral: Negative.    Today's Vitals   01/18/18 1402  BP: (!) 152/96  Pulse: 91  Resp: 16  SpO2: 99%  Weight: 149 lb (67.6 kg)  Height: 5\' 5"  (1.651 m)    Physical Exam  Constitutional: She is oriented to person, place, and time. She appears well-developed and well-nourished. No distress.  HENT:  Head: Normocephalic and atraumatic.  Right Ear: External ear normal.  Left Ear: External ear normal.  Nose: Rhinorrhea present. Right sinus exhibits frontal sinus tenderness. Left sinus exhibits frontal sinus tenderness.  Mouth/Throat: Posterior oropharyngeal edema present. No oropharyngeal exudate.  Post-nasal drip present   Eyes: EOM are normal. Pupils are equal, round, and reactive to light.  Neck: Normal range of motion. Neck supple. No JVD present. No tracheal deviation present. No thyromegaly present.  Cardiovascular: Normal rate, regular rhythm and normal heart sounds. Exam reveals no gallop and no friction rub.  No murmur heard. Pulmonary/Chest: Effort normal and breath sounds normal. No respiratory distress. She has no wheezes. She has no rales. She exhibits no tenderness.  Abdominal: Soft. Bowel sounds are normal. There is no tenderness.  Musculoskeletal: Normal range of motion.  Lymphadenopathy:    She has no cervical adenopathy.  Neurological: She is alert and oriented to person, place, and time. No cranial nerve deficit.  Skin: Skin is warm and dry. She is not diaphoretic.  Psychiatric: She has a normal mood and affect. Her behavior is normal. Judgment and thought content normal.  Nursing note and vitals reviewed.   Assessment/Plan:  1. Acute non-recurrent sinusitis, unspecified location - azithromycin (ZITHROMAX) 250 MG tablet; z-pack - take by mouth as  directed for 5 days.  Dispense: 6 tablet; Refill: 0 Sample sinus rinse given to patient. Se should continue OTC delsmy BID when needed for cough.   2. Essential (primary) hypertension - amLODipine (NORVASC) 5 MG tablet; Take 1 tablet (5 mg total) by mouth daily.  Dispense: 30 tablet; Refill: 3 - losartan-hydrochlorothiazide (HYZAAR) 50-12.5 MG tablet; Take 1 tablet by mouth daily.  Dispense: 30 tablet; Refill: 3  3. Mixed hyperlipidemia - lovastatin (MEVACOR) 20 MG tablet; Take 1 tablet (20 mg total) by mouth at bedtime.  Dispense: 30 tablet; Refill: 3  Check fasting lipid panel prior to next visit and adjust as indicated.   She should return to the office in 4 months and sooner if needed   General Counseling: felisia balcom understanding of the findings of todays visit and agrees with plan of treatment. I have discussed any further diagnostic evaluation that may be needed or ordered today. We also reviewed her medications today. she has been encouraged to call the office with any questions or concerns that should arise related to todays visit.   This patient was seen by Leretha Pol, FNP- C in Collaboration with Dr [name] as a part of collaborative care agreement   Time spent:  [facility]

## 2018-01-26 ENCOUNTER — Other Ambulatory Visit: Payer: Self-pay | Admitting: Nurse Practitioner

## 2018-01-26 DIAGNOSIS — E782 Mixed hyperlipidemia: Secondary | ICD-10-CM | POA: Diagnosis not present

## 2018-01-26 DIAGNOSIS — R7301 Impaired fasting glucose: Secondary | ICD-10-CM | POA: Diagnosis not present

## 2018-01-26 DIAGNOSIS — I10 Essential (primary) hypertension: Secondary | ICD-10-CM | POA: Diagnosis not present

## 2018-01-27 LAB — CBC
Hematocrit: 37.2 % (ref 34.0–46.6)
Hemoglobin: 12.8 g/dL (ref 11.1–15.9)
MCH: 28.1 pg (ref 26.6–33.0)
MCHC: 34.4 g/dL (ref 31.5–35.7)
MCV: 82 fL (ref 79–97)
Platelets: 457 10*3/uL — ABNORMAL HIGH (ref 150–379)
RBC: 4.56 x10E6/uL (ref 3.77–5.28)
RDW: 14.3 % (ref 12.3–15.4)
WBC: 5.5 10*3/uL (ref 3.4–10.8)

## 2018-01-27 LAB — COMPREHENSIVE METABOLIC PANEL
ALBUMIN: 4.7 g/dL (ref 3.5–4.8)
ALT: 21 IU/L (ref 0–32)
AST: 17 IU/L (ref 0–40)
Albumin/Globulin Ratio: 1.9 (ref 1.2–2.2)
Alkaline Phosphatase: 57 IU/L (ref 39–117)
BILIRUBIN TOTAL: 0.4 mg/dL (ref 0.0–1.2)
BUN / CREAT RATIO: 17 (ref 12–28)
BUN: 12 mg/dL (ref 8–27)
CO2: 23 mmol/L (ref 20–29)
Calcium: 9.4 mg/dL (ref 8.7–10.3)
Chloride: 102 mmol/L (ref 96–106)
Creatinine, Ser: 0.69 mg/dL (ref 0.57–1.00)
GFR calc Af Amer: 101 mL/min/{1.73_m2} (ref >59)
GFR calc non Af Amer: 87 mL/min/{1.73_m2} (ref >59)
GLOBULIN, TOTAL: 2.5 g/dL (ref 1.5–4.5)
Glucose: 110 mg/dL — ABNORMAL HIGH (ref 65–99)
Potassium: 4.3 mmol/L (ref 3.5–5.2)
SODIUM: 143 mmol/L (ref 134–144)
Total Protein: 7.2 g/dL (ref 6.0–8.5)

## 2018-01-27 LAB — HGB A1C W/O EAG: Hgb A1c MFr Bld: 6.1 % — ABNORMAL HIGH (ref 4.8–5.6)

## 2018-01-27 LAB — LIPID PANEL W/O CHOL/HDL RATIO
Cholesterol, Total: 246 mg/dL — ABNORMAL HIGH (ref 100–199)
HDL: 44 mg/dL (ref >39)
LDL Calculated: 172 mg/dL — ABNORMAL HIGH (ref 0–99)
TRIGLYCERIDES: 149 mg/dL (ref 0–149)
VLDL Cholesterol Cal: 30 mg/dL (ref 5–40)

## 2018-01-27 LAB — T4, FREE: FREE T4: 1.08 ng/dL (ref 0.82–1.77)

## 2018-01-27 LAB — TSH: TSH: 1.95 u[IU]/mL (ref 0.450–4.500)

## 2018-01-28 ENCOUNTER — Other Ambulatory Visit: Payer: Self-pay

## 2018-01-28 MED ORDER — LISINOPRIL-HYDROCHLOROTHIAZIDE 20-12.5 MG PO TABS: 1.0000 | ORAL_TABLET | Freq: Two times a day (BID) | ORAL | 3 refills | Status: DC

## 2018-02-11 ENCOUNTER — Telehealth: Payer: Self-pay

## 2018-02-11 NOTE — Telephone Encounter (Signed)
-----   Message from Ronnell Freshwater, NP sent at 02/11/2018  8:40 AM EST ----- Ingeneral, her labs were good. Bad and total cholesterol were a little high. Make sure she is taking her cholesterol medication. Limit fried and fatty foods in her diet and increase regular exercise. We also checked diabetes, which she does not have, however, she is at risk of developing diabetes in the future. Watching her diet and increasing her exercise will also help control this. We can discuss labs in more detail at her next visit. thanks

## 2018-02-11 NOTE — Telephone Encounter (Signed)
Pt advised for labs see reminder

## 2018-02-17 DIAGNOSIS — H40153 Residual stage of open-angle glaucoma, bilateral: Secondary | ICD-10-CM | POA: Diagnosis not present

## 2018-04-01 ENCOUNTER — Other Ambulatory Visit: Payer: Self-pay | Admitting: Nurse Practitioner

## 2018-04-01 DIAGNOSIS — I10 Essential (primary) hypertension: Secondary | ICD-10-CM

## 2018-04-01 MED ORDER — AMLODIPINE BESYLATE 5 MG PO TABS: 5.0000 mg | ORAL_TABLET | Freq: Every day | ORAL | 3 refills | Status: DC

## 2018-04-20 ENCOUNTER — Ambulatory Visit: Payer: Self-pay | Admitting: Nurse Practitioner

## 2018-05-03 ENCOUNTER — Ambulatory Visit: Payer: Self-pay | Admitting: Nurse Practitioner

## 2018-05-27 ENCOUNTER — Ambulatory Visit: Payer: Self-pay | Admitting: Nurse Practitioner

## 2018-06-04 ENCOUNTER — Other Ambulatory Visit: Payer: Self-pay

## 2018-06-04 MED ORDER — LISINOPRIL-HYDROCHLOROTHIAZIDE 20-12.5 MG PO TABS: 1.0000 | ORAL_TABLET | Freq: Two times a day (BID) | ORAL | 3 refills | Status: DC

## 2018-06-15 ENCOUNTER — Ambulatory Visit (INDEPENDENT_AMBULATORY_CARE_PROVIDER_SITE_OTHER): Payer: Medicare Other | Admitting: Nurse Practitioner

## 2018-06-15 ENCOUNTER — Encounter: Payer: Self-pay | Admitting: Nurse Practitioner

## 2018-06-15 VITALS — BP 119/75 | HR 109 | Resp 16 | Ht 65.0 in | Wt 150.0 lb

## 2018-06-15 DIAGNOSIS — J019 Acute sinusitis, unspecified: Secondary | ICD-10-CM | POA: Diagnosis not present

## 2018-06-15 DIAGNOSIS — I10 Essential (primary) hypertension: Secondary | ICD-10-CM

## 2018-06-15 DIAGNOSIS — E782 Mixed hyperlipidemia: Secondary | ICD-10-CM

## 2018-06-15 MED ORDER — AZITHROMYCIN 250 MG PO TABS: ORAL_TABLET | ORAL | 0 refills | Status: DC

## 2018-06-15 NOTE — Progress Notes (Signed)
[facility] [place], [place] [address]  Internal MEDICINE  Office Visit Note  Patient Name: Virginia Warner  [id number]  [id number]  Date of Service: 07/05/2018    Pt is here for routine follow up.  Chief Complaint  Patient presents with  . Hypertension  . Sinusitis    Hypertension  This is a chronic problem. The current episode started more than 1 year ago. The problem is unchanged. The problem is controlled. Associated symptoms include headaches. Pertinent negatives include no chest pain or palpitations. There are no associated agents to hypertension. Risk factors for coronary artery disease include dyslipidemia and stress. Past treatments include ACE inhibitors, calcium channel blockers and diuretics. The current treatment provides moderate improvement. There are no compliance problems.         Current Medication: Outpatient Encounter Medications as of 06/15/2018  Medication Sig Note  . amLODipine (NORVASC) 5 MG tablet Take 1 tablet (5 mg total) by mouth daily.   Marland Kitchen aspirin EC 81 MG tablet Take 81 mg by mouth daily.   Marland Kitchen azithromycin (ZITHROMAX) 250 MG tablet z-pack - take by mouth as directed for 5 days.   . fluticasone (FLONASE) 50 MCG/ACT nasal spray Place into both nostrils daily.   Marland Kitchen geriatric multivitamins-minerals (ELDERTONIC/GEVRABON) ELIX Take 15 mLs by mouth daily.   Marland Kitchen latanoprost (XALATAN) 0.005 % ophthalmic solution 1 drop at bedtime.   Marland Kitchen lisinopril-hydrochlorothiazide (PRINZIDE,ZESTORETIC) 20-12.5 MG tablet Take 1 tablet by mouth 2 (two) times daily.   Marland Kitchen lovastatin (MEVACOR) 20 MG tablet Take 1 tablet (20 mg total) by mouth at bedtime.   . polyethylene glycol powder (GLYCOLAX/MIRALAX) powder Take as directed for colon prep 02/05/2016: Received from: [facility]  . [DISCONTINUED] azithromycin (ZITHROMAX) 250 MG tablet z-pack - take by mouth as directed for 5 days.    No facility-administered encounter medications on file as of  06/15/2018.     Surgical History: Past Surgical History:  Procedure Laterality Date  . ABDOMINAL HYSTERECTOMY    . COLONOSCOPY WITH PROPOFOL N/A 10/18/2015   Procedure: COLONOSCOPY WITH PROPOFOL;  Surgeon: Manya Silvas, MD;  Location: [facility] ENDOSCOPY;  Service: Endoscopy;  Laterality: N/A;  . POLYPECTOMY      Medical History: Past Medical History:  Diagnosis Date  . Hyperlipidemia   . Hypertension   . Seasonal allergies     Family History: Family History  Problem Relation Age of Onset  . Cancer Brother   . Breast cancer Neg Hx     Social History   Socioeconomic History  . Marital status: Divorced    Spouse name: Not on file  . Number of children: Not on file  . Years of education: Not on file  . Highest education level: Not on file  Occupational History  . Not on file  Social Needs  . Financial resource strain: Not on file  . Food insecurity:    Worry: Not on file    Inability: Not on file  . Transportation needs:    Medical: Not on file    Non-medical: Not on file  Tobacco Use  . Smoking status: Former Research scientist (life sciences)  . Smokeless tobacco: Never Used  Substance and Sexual Activity  . Alcohol use: Yes    Alcohol/week: 0.0 oz    Comment: ocassionally   . Drug use: No  . Sexual activity: Not on file  Lifestyle  . Physical activity:    Days per week: Not on file    Minutes per session:  Not on file  . Stress: Not on file  Relationships  . Social connections:    Talks on phone: Not on file    Gets together: Not on file    Attends religious service: Not on file    Active member of club or organization: Not on file    Attends meetings of clubs or organizations: Not on file    Relationship status: Not on file  . Intimate partner violence:    Fear of current or ex partner: Not on file    Emotionally abused: Not on file    Physically abused: Not on file    Forced sexual activity: Not on file  Other Topics Concern  . Not on file  Social History Narrative  .  Not on file      Review of Systems  Constitutional: Negative for fatigue, fever and unexpected weight change.  HENT: Positive for congestion and rhinorrhea. Negative for sore throat and voice change.   Eyes: Negative.   Respiratory: Negative for cough and wheezing.   Cardiovascular: Negative for chest pain and palpitations.       Blood pressure generally stable.   Gastrointestinal: Negative for constipation, nausea and vomiting.  Endocrine: Negative for cold intolerance, heat intolerance, polydipsia, polyphagia and polyuria.  Genitourinary: Negative for flank pain.  Musculoskeletal: Negative for arthralgias, back pain and myalgias.  Allergic/Immunologic: Positive for environmental allergies.  Neurological: Positive for headaches.  Hematological: Negative for adenopathy.  Psychiatric/Behavioral: The patient is nervous/anxious.     Today's Vitals   06/15/18 1113 06/15/18 1145  BP: (!) 145/82 119/75  Pulse: (!) 109   Resp: 16   SpO2: 96%   Weight: 150 lb (68 kg)   Height: 5\' 5"  (1.651 m)     Physical Exam  Constitutional: She is oriented to person, place, and time. She appears well-developed and well-nourished. No distress.  HENT:  Head: Normocephalic and atraumatic.  Right Ear: External ear normal.  Left Ear: External ear normal.  Nose: Rhinorrhea present. Right sinus exhibits frontal sinus tenderness. Left sinus exhibits frontal sinus tenderness.  Mouth/Throat: Posterior oropharyngeal edema present. No oropharyngeal exudate.  Post-nasal drip present   Eyes: Pupils are equal, round, and reactive to light. Conjunctivae and EOM are normal.  Neck: Normal range of motion. Neck supple. No JVD present. No tracheal deviation present. No thyromegaly present.  Cardiovascular: Normal rate, regular rhythm and normal heart sounds. Exam reveals no gallop and no friction rub.  No murmur heard. Pulmonary/Chest: Effort normal and breath sounds normal. No respiratory distress. She has no  wheezes. She has no rales. She exhibits no tenderness.  Abdominal: Soft. Bowel sounds are normal. There is no tenderness.  Musculoskeletal: Normal range of motion.  Lymphadenopathy:    She has no cervical adenopathy.  Neurological: She is alert and oriented to person, place, and time. No cranial nerve deficit.  Skin: Skin is warm and dry. She is not diaphoretic.  Psychiatric: She has a normal mood and affect. Her behavior is normal. Judgment and thought content normal.  Nursing note and vitals reviewed.  Assessment/Plan: 1. Acute non-recurrent sinusitis, unspecified location Start z-pack. Take as directed for 5 days. Rest. Use OTC medication to treat symptoms.  - azithromycin (ZITHROMAX) 250 MG tablet; z-pack - take by mouth as directed for 5 days.  Dispense: 6 tablet; Refill: 0  2. Essential (primary) hypertension Stable. Continue bp medication as prescribed   3. Mixed hyperlipidemia Continue lovastatin as prescribed.   General Counseling: Sandy Salaam understanding of  the findings of todays visit and agrees with plan of treatment. I have discussed any further diagnostic evaluation that may be needed or ordered today. We also reviewed her medications today. she has been encouraged to call the office with any questions or concerns that should arise related to todays visit.    Counseling:  This patient was seen by Leretha Pol, FNP- C in Collaboration with Dr [name] as a part of collaborative care agreement    Meds ordered this encounter  Medications  . azithromycin (ZITHROMAX) 250 MG tablet    Sig: z-pack - take by mouth as directed for 5 days.    Dispense:  6 tablet    Refill:  0    Order Specific Question:   Supervising Provider    Answer:   [name] [[id number]]    Time spent: 32 Minutes     Dr [name] Internal medicine

## 2018-07-05 DIAGNOSIS — J019 Acute sinusitis, unspecified: Secondary | ICD-10-CM | POA: Insufficient documentation

## 2018-07-28 DIAGNOSIS — H40153 Residual stage of open-angle glaucoma, bilateral: Secondary | ICD-10-CM | POA: Diagnosis not present

## 2018-08-02 ENCOUNTER — Other Ambulatory Visit: Payer: Self-pay

## 2018-08-02 DIAGNOSIS — I10 Essential (primary) hypertension: Secondary | ICD-10-CM

## 2018-08-02 MED ORDER — AMLODIPINE BESYLATE 5 MG PO TABS: 5.0000 mg | ORAL_TABLET | Freq: Every day | ORAL | 3 refills | Status: DC

## 2018-09-22 ENCOUNTER — Ambulatory Visit: Payer: Self-pay

## 2018-09-27 ENCOUNTER — Ambulatory Visit: Payer: Self-pay

## 2018-10-05 DIAGNOSIS — Z23 Encounter for immunization: Secondary | ICD-10-CM | POA: Diagnosis not present

## 2018-10-20 DIAGNOSIS — H40153 Residual stage of open-angle glaucoma, bilateral: Secondary | ICD-10-CM | POA: Diagnosis not present

## 2018-10-28 ENCOUNTER — Other Ambulatory Visit: Payer: Self-pay | Admitting: Family Medicine

## 2018-10-28 DIAGNOSIS — Z1231 Encounter for screening mammogram for malignant neoplasm of breast: Secondary | ICD-10-CM

## 2018-11-01 ENCOUNTER — Other Ambulatory Visit: Payer: Self-pay | Admitting: Nurse Practitioner

## 2018-11-01 DIAGNOSIS — Z1231 Encounter for screening mammogram for malignant neoplasm of breast: Secondary | ICD-10-CM

## 2018-11-01 DIAGNOSIS — E2839 Other primary ovarian failure: Secondary | ICD-10-CM

## 2018-11-15 ENCOUNTER — Other Ambulatory Visit: Payer: Self-pay

## 2018-11-15 MED ORDER — FLUTICASONE PROPIONATE 50 MCG/ACT NA SUSP: 2.0000 | Freq: Every day | NASAL | 3 refills | Status: DC

## 2018-11-29 ENCOUNTER — Other Ambulatory Visit: Payer: Self-pay

## 2018-11-29 MED ORDER — LISINOPRIL-HYDROCHLOROTHIAZIDE 20-12.5 MG PO TABS: 1.0000 | ORAL_TABLET | Freq: Two times a day (BID) | ORAL | 3 refills | Status: DC

## 2018-12-14 ENCOUNTER — Other Ambulatory Visit: Payer: Medicare Other

## 2018-12-24 ENCOUNTER — Ambulatory Visit (INDEPENDENT_AMBULATORY_CARE_PROVIDER_SITE_OTHER): Payer: Medicare Other | Admitting: Nurse Practitioner

## 2018-12-24 ENCOUNTER — Encounter: Payer: Self-pay | Admitting: Nurse Practitioner

## 2018-12-24 VITALS — BP 150/70 | HR 113 | Resp 16 | Ht 65.0 in | Wt 149.2 lb

## 2018-12-24 DIAGNOSIS — J0141 Acute recurrent pansinusitis: Secondary | ICD-10-CM

## 2018-12-24 DIAGNOSIS — I10 Essential (primary) hypertension: Secondary | ICD-10-CM

## 2018-12-24 DIAGNOSIS — R591 Generalized enlarged lymph nodes: Secondary | ICD-10-CM | POA: Diagnosis not present

## 2018-12-24 MED ORDER — AMOXICILLIN-POT CLAVULANATE 875-125 MG PO TABS: 1.0000 | ORAL_TABLET | Freq: Two times a day (BID) | ORAL | 0 refills | Status: DC

## 2018-12-24 NOTE — Progress Notes (Signed)
[facility] [place], [place] [address]  Internal MEDICINE  Office Visit Note  Patient Name: Virginia Warner  [id number]  [id number]  Date of Service: 12/24/2018    Pt is here for a sick visit.  Chief Complaint  Patient presents with  . Edema    around jaw line on the left side, no fever, scratchy throat  . Pain    legs go numb occasionally  . Ear Problem    left ear pain comes and go pt have had some sinus drainage recently     The patient is here for sick visit. She has been having nasal and sinus congestion, along with sore throat and headache for past few weeks. Today, she noted swelling in the neck, just below the left ear. This is slightly tender. Got her very worried. Blood pressure moderately elevated. She believes it is because she has been worrying about this all day. Denies fever, chills, body aches, nausea, or vomiting.       Current Medication:  Outpatient Encounter Medications as of 12/24/2018  Medication Sig Note  . amLODipine (NORVASC) 5 MG tablet Take 1 tablet (5 mg total) by mouth daily.   Marland Kitchen aspirin EC 81 MG tablet Take 81 mg by mouth daily.   . fluticasone (FLONASE) 50 MCG/ACT nasal spray Place 2 sprays into both nostrils daily.   Marland Kitchen geriatric multivitamins-minerals (ELDERTONIC/GEVRABON) ELIX Take 15 mLs by mouth daily.   Marland Kitchen latanoprost (XALATAN) 0.005 % ophthalmic solution 1 drop at bedtime.   Marland Kitchen lisinopril-hydrochlorothiazide (PRINZIDE,ZESTORETIC) 20-12.5 MG tablet Take 1 tablet by mouth 2 (two) times daily.   Marland Kitchen lovastatin (MEVACOR) 20 MG tablet Take 1 tablet (20 mg total) by mouth at bedtime.   . polyethylene glycol powder (GLYCOLAX/MIRALAX) powder Take as directed for colon prep 02/05/2016: Received from: [facility]  . amoxicillin-clavulanate (AUGMENTIN) 875-125 MG tablet Take 1 tablet by mouth 2 (two) times daily.   Marland Kitchen azithromycin (ZITHROMAX) 250 MG tablet z-pack - take by mouth as directed for 5 days. (Patient  not taking: Reported on 12/24/2018)    No facility-administered encounter medications on file as of 12/24/2018.       Medical History: Past Medical History:  Diagnosis Date  . Hyperlipidemia   . Hypertension   . Seasonal allergies    Today's Vitals   12/24/18 1145  BP: (!) 150/70  Pulse: (!) 113  Resp: 16  SpO2: 99%  Weight: 149 lb 3.2 oz (67.7 kg)  Height: 5\' 5"  (1.651 m)   Review of Systems  Constitutional: Negative for appetite change, chills, diaphoresis, fatigue and fever.  HENT: Positive for congestion, ear pain, postnasal drip, rhinorrhea, sinus pressure, sinus pain and sore throat. Negative for voice change.   Respiratory: Negative for cough, chest tightness and wheezing.   Cardiovascular: Negative for chest pain and palpitations.       Blood pressure elevated today.  Gastrointestinal: Negative for nausea and vomiting.  Endocrine: Negative.   Musculoskeletal: Negative.   Skin: Negative.   Allergic/Immunologic: Positive for environmental allergies.  Neurological: Positive for headaches.  Hematological: Positive for adenopathy.  Psychiatric/Behavioral: The patient is nervous/anxious.     Physical Exam Vitals signs and nursing note reviewed.  Constitutional:      General: She is not in acute distress.    Appearance: Normal appearance. She is well-developed. She is ill-appearing. She is not diaphoretic.  HENT:     Head: Normocephalic and atraumatic.     Right Ear: Ear canal  and external ear normal.     Left Ear: Tympanic membrane is erythematous and bulging.     Nose: Congestion and rhinorrhea present. Rhinorrhea is clear.     Right Turbinates: Swollen.     Left Turbinates: Swollen.     Right Sinus: Maxillary sinus tenderness and frontal sinus tenderness present.     Left Sinus: Maxillary sinus tenderness and frontal sinus tenderness present.     Mouth/Throat:     Pharynx: Posterior oropharyngeal erythema present. No oropharyngeal exudate.  Eyes:      Pupils: Pupils are equal, round, and reactive to light.  Neck:     Musculoskeletal: Normal range of motion and neck supple.     Thyroid: No thyromegaly.     Vascular: No JVD.     Trachea: No tracheal deviation.     Comments: Large, palpable preauricular lymph node present.  Cardiovascular:     Rate and Rhythm: Regular rhythm. Tachycardia present.     Heart sounds: Normal heart sounds. No murmur. No friction rub. No gallop.   Pulmonary:     Effort: Pulmonary effort is normal. No respiratory distress.     Breath sounds: Normal breath sounds. No wheezing or rales.  Chest:     Chest wall: No tenderness.  Abdominal:     General: Bowel sounds are normal.     Palpations: Abdomen is soft.     Tenderness: There is no abdominal tenderness.  Musculoskeletal: Normal range of motion.  Lymphadenopathy:     Cervical: Cervical adenopathy present.  Skin:    General: Skin is warm and dry.  Neurological:     General: No focal deficit present.     Mental Status: She is alert and oriented to person, place, and time.     Cranial Nerves: No cranial nerve deficit.  Psychiatric:        Attention and Perception: Attention and perception normal.        Mood and Affect: Mood is anxious.        Speech: Speech normal.        Behavior: Behavior normal.        Thought Content: Thought content normal.        Cognition and Memory: Cognition and memory normal.        Judgment: Judgment normal.   Assessment/Plan: 1. Acute recurrent pansinusitis Start augmentin 875mg  twice daily for 10 days. Rest and increase fluids. Recommend OTC medication to treat symptoms.  - amoxicillin-clavulanate (AUGMENTIN) 875-125 MG tablet; Take 1 tablet by mouth 2 (two) times daily.  Dispense: 20 tablet; Refill: 0  2. Lymphadenopathy Likely from infection. Reassess at visit next Monday.   3. Essential (primary) hypertension Generally stable. No changes in bp medication today.   General Counseling: Virginia Warner understanding  of the findings of todays visit and agrees with plan of treatment. I have discussed any further diagnostic evaluation that may be needed or ordered today. We also reviewed her medications today. she has been encouraged to call the office with any questions or concerns that should arise related to todays visit.    Counseling:  Rest and increase fluids. Continue using OTC medication to control symptoms.   This patient was seen by [place] with Dr [name] as a part of collaborative care agreement  Meds ordered this encounter  Medications  . amoxicillin-clavulanate (AUGMENTIN) 875-125 MG tablet    Sig: Take 1 tablet by mouth 2 (two) times daily.    Dispense:  20 tablet  Refill:  0    Order Specific Question:   Supervising Provider    Answer:   [name] [[id number]]    Time spent: 15 Minutes

## 2018-12-27 ENCOUNTER — Ambulatory Visit (INDEPENDENT_AMBULATORY_CARE_PROVIDER_SITE_OTHER): Payer: Medicare Other | Admitting: Nurse Practitioner

## 2018-12-27 ENCOUNTER — Encounter: Payer: Self-pay | Admitting: Nurse Practitioner

## 2018-12-27 VITALS — BP 142/78 | HR 104 | Resp 16 | Ht 65.0 in | Wt 148.8 lb

## 2018-12-27 DIAGNOSIS — E559 Vitamin D deficiency, unspecified: Secondary | ICD-10-CM

## 2018-12-27 DIAGNOSIS — J019 Acute sinusitis, unspecified: Secondary | ICD-10-CM

## 2018-12-27 DIAGNOSIS — R300 Dysuria: Secondary | ICD-10-CM | POA: Diagnosis not present

## 2018-12-27 DIAGNOSIS — E782 Mixed hyperlipidemia: Secondary | ICD-10-CM

## 2018-12-27 DIAGNOSIS — I10 Essential (primary) hypertension: Secondary | ICD-10-CM | POA: Diagnosis not present

## 2018-12-27 DIAGNOSIS — J309 Allergic rhinitis, unspecified: Secondary | ICD-10-CM | POA: Diagnosis not present

## 2018-12-27 DIAGNOSIS — Z0001 Encounter for general adult medical examination with abnormal findings: Secondary | ICD-10-CM | POA: Diagnosis not present

## 2018-12-27 NOTE — Progress Notes (Signed)
[facility] [place], [place] [address]  Internal MEDICINE  Office Visit Note  Patient Name: Virginia Warner  [id number]  [id number]  Date of Service: 12/27/2018  Chief Complaint  Patient presents with  . Medicare Wellness    6 month well visit     The patient is here for annual health maintenance exam. She does have high blood pressure. Much better today than at her last visit lat week. She has brought log of her blood pressures from over the weekend which are all around 120s/80s. She denies chest pain or pressure. She has no headache.  She was seen last week for uri. Treated with amoxicillin BID for 10 days. She states that congestion is better. Swollen lymph nodes are improved as well.   Pt is here for routine health maintenance examination  Current Medication: Outpatient Encounter Medications as of 12/27/2018  Medication Sig Note  . amLODipine (NORVASC) 5 MG tablet Take 1 tablet (5 mg total) by mouth daily.   Marland Kitchen amoxicillin-clavulanate (AUGMENTIN) 875-125 MG tablet Take 1 tablet by mouth 2 (two) times daily.   Marland Kitchen aspirin EC 81 MG tablet Take 81 mg by mouth daily.   Marland Kitchen azithromycin (ZITHROMAX) 250 MG tablet z-pack - take by mouth as directed for 5 days.   . fluticasone (FLONASE) 50 MCG/ACT nasal spray Place 2 sprays into both nostrils daily.   Marland Kitchen geriatric multivitamins-minerals (ELDERTONIC/GEVRABON) ELIX Take 15 mLs by mouth daily.   Marland Kitchen latanoprost (XALATAN) 0.005 % ophthalmic solution 1 drop at bedtime.   Marland Kitchen lisinopril-hydrochlorothiazide (PRINZIDE,ZESTORETIC) 20-12.5 MG tablet Take 1 tablet by mouth 2 (two) times daily.   Marland Kitchen lovastatin (MEVACOR) 20 MG tablet Take 1 tablet (20 mg total) by mouth at bedtime.   . polyethylene glycol powder (GLYCOLAX/MIRALAX) powder Take as directed for colon prep 02/05/2016: Received from: [place]   No facility-administered encounter medications on file as of 12/27/2018.     Surgical History: Past  Surgical History:  Procedure Laterality Date  . ABDOMINAL HYSTERECTOMY    . COLONOSCOPY WITH PROPOFOL N/A 10/18/2015   Procedure: COLONOSCOPY WITH PROPOFOL;  Surgeon: Manya Silvas, MD;  Location: [facility] ENDOSCOPY;  Service: Endoscopy;  Laterality: N/A;  . POLYPECTOMY      Medical History: Past Medical History:  Diagnosis Date  . Hyperlipidemia   . Hypertension   . Seasonal allergies     Family History: Family History  Problem Relation Age of Onset  . Cancer Brother   . Breast cancer Neg Hx       Review of Systems  Constitutional: Negative for activity change, fatigue, fever and unexpected weight change.  HENT: Positive for congestion and rhinorrhea. Negative for sore throat and voice change.        Improved since last visit .  Eyes: Negative.   Respiratory: Positive for cough. Negative for wheezing.   Cardiovascular: Negative for chest pain and palpitations.       Blood pressure generally stable.   Gastrointestinal: Negative for constipation, nausea and vomiting.  Endocrine: Negative for cold intolerance, heat intolerance, polydipsia, polyphagia and polyuria.  Genitourinary: Negative for flank pain.  Musculoskeletal: Negative for arthralgias, back pain and myalgias.  Skin: Negative for rash.  Allergic/Immunologic: Positive for environmental allergies.  Neurological: Positive for headaches.  Hematological: Negative for adenopathy.  Psychiatric/Behavioral: The patient is nervous/anxious.      Vital Signs: BP (!) 142/78 (BP Location: Left Arm, Patient Position: Sitting, Cuff Size: Normal)   Pulse (!) 104  Resp 16   Ht 5\' 5"  (1.651 m)   Wt 148 lb 12.8 oz (67.5 kg)   SpO2 98%   BMI 24.76 kg/m    Physical Exam Vitals signs and nursing note reviewed.  Constitutional:      General: She is not in acute distress.    Appearance: Normal appearance. She is well-developed. She is not ill-appearing or diaphoretic.  HENT:     Head: Normocephalic and atraumatic.      Right Ear: Ear canal and external ear normal.     Left Ear: Ear canal and external ear normal. Tympanic membrane is not erythematous or bulging.     Nose: No congestion or rhinorrhea.     Right Turbinates: Not swollen.     Left Turbinates: Not swollen.     Right Sinus: No maxillary sinus tenderness or frontal sinus tenderness.     Left Sinus: No maxillary sinus tenderness or frontal sinus tenderness.     Mouth/Throat:     Pharynx: No oropharyngeal exudate or posterior oropharyngeal erythema.  Eyes:     Pupils: Pupils are equal, round, and reactive to light.  Neck:     Musculoskeletal: Normal range of motion and neck supple.     Thyroid: No thyromegaly.     Vascular: No carotid bruit or JVD.     Trachea: No tracheal deviation.     Comments: Lymph node enlargement improved since visit last Friday.  Cardiovascular:     Rate and Rhythm: Normal rate and regular rhythm.     Pulses: Normal pulses.     Heart sounds: Normal heart sounds. No murmur. No friction rub. No gallop.   Pulmonary:     Effort: Pulmonary effort is normal. No respiratory distress.     Breath sounds: Normal breath sounds. No wheezing or rales.     Comments: Scant wheezing throughout the lung fields. Clear with cough.  Chest:     Chest wall: No tenderness.     Breasts:        Right: Normal. No swelling, bleeding, inverted nipple, mass, nipple discharge, skin change or tenderness.        Left: Normal. No swelling, bleeding, inverted nipple, mass, nipple discharge, skin change or tenderness.  Abdominal:     General: Bowel sounds are normal.     Palpations: Abdomen is soft.     Tenderness: There is no abdominal tenderness.  Musculoskeletal: Normal range of motion.  Lymphadenopathy:     Cervical: Cervical adenopathy present.  Skin:    General: Skin is warm and dry.  Neurological:     General: No focal deficit present.     Mental Status: She is alert and oriented to person, place, and time. Mental status is at baseline.      Cranial Nerves: No cranial nerve deficit.  Psychiatric:        Attention and Perception: Attention and perception normal.        Mood and Affect: Mood is anxious.        Speech: Speech normal.        Behavior: Behavior normal.        Thought Content: Thought content normal.        Cognition and Memory: Cognition and memory normal.        Judgment: Judgment normal.    Depression screen [facility] 2/9 12/27/2018 06/15/2018 01/18/2018  Decreased Interest 0 0 0  Down, Depressed, Hopeless 0 0 0  PHQ - 2 Score 0 0 0  Functional Status Survey: Is the patient deaf or have difficulty hearing?: No Does the patient have difficulty seeing, even when wearing glasses/contacts?: Yes(cataract) Does the patient have difficulty concentrating, remembering, or making decisions?: No Does the patient have difficulty walking or climbing stairs?: Yes(due to knee) Does the patient have difficulty dressing or bathing?: No Does the patient have difficulty doing errands alone such as visiting a doctor's office or shopping?: No  MMSE - [place] Exam 12/27/2018  Orientation to time 5  Orientation to Place 5  Registration 3  Attention/ Calculation 5  Recall 3  Language- name 2 objects 2  Language- repeat 1  Language- follow 3 step command 3  Language- read & follow direction 1  Write a sentence 1  Copy design 1  Total score 30    Fall Risk  12/27/2018 06/15/2018 01/18/2018  Falls in the past year? 0 No No   Assessment/Plan: 1. Encounter for general adult medical examination with abnormal findings Annual health maintenance exam today - CBC with Differential/Platelet - Comprehensive metabolic panel - T4, free - TSH  2. Essential (primary) hypertension Stable. Continue blood pressure medications as prescribed  - CBC with Differential/Platelet - Comprehensive metabolic panel - T4, free - TSH  3. Chronic allergic rhinitis Refer to Dr. Devona Konig for allergy testing and treatment.  -  Ambulatory referral to Pulmonology - Allergy Test  4. Mixed hyperlipidemia Check fasting lipid panel and treat as indicated . - Lipid panel  5. Acute non-recurrent sinusitis, unspecified location Improving. Continue antibiotics until complete.   6. Vitamin D deficiency - Vitamin D 1,25 dihydroxy  General Counseling: Manika verbalizes understanding of the findings of todays visit and agrees with plan of treatment. I have discussed any further diagnostic evaluation that may be needed or ordered today. We also reviewed her medications today. she has been encouraged to call the office with any questions or concerns that should arise related to todays visit.    Counseling:  Hypertension Counseling:   The following hypertensive lifestyle modification were recommended and discussed:  1. Limiting alcohol intake to less than 1 oz/day of ethanol:(24 oz of beer or 8 oz of wine or 2 oz of 100-proof whiskey). 2. Take baby ASA 81 mg daily. 3. Importance of regular aerobic exercise and losing weight. 4. Reduce dietary saturated fat and cholesterol intake for overall cardiovascular health. 5. Maintaining adequate dietary potassium, calcium, and magnesium intake. 6. Regular monitoring of the blood pressure. 7. Reduce sodium intake to less than 100 mmol/day (less than 2.3 gm of sodium or less than 6 gm of sodium choride)   This patient was seen by [place] with Dr [name] as a part of collaborative care agreement  Orders Placed This Encounter  Procedures  . Allergy Test  . CBC with Differential/Platelet  . Comprehensive metabolic panel  . T4, free  . TSH  . Lipid panel  . Vitamin D 1,25 dihydroxy  . Ambulatory referral to Pulmonology     Time spent: [place], MD  Internal Medicine

## 2018-12-27 NOTE — Addendum Note (Signed)
Addended by: Radene Knee on: 12/27/2018 09:31 AM   Modules accepted: Orders

## 2018-12-28 ENCOUNTER — Other Ambulatory Visit: Payer: Self-pay

## 2018-12-28 DIAGNOSIS — I10 Essential (primary) hypertension: Secondary | ICD-10-CM

## 2018-12-28 MED ORDER — AMLODIPINE BESYLATE 5 MG PO TABS: 5.0000 mg | ORAL_TABLET | Freq: Every day | ORAL | 3 refills | Status: DC

## 2019-01-03 ENCOUNTER — Ambulatory Visit
Admission: RE | Admit: 2019-01-03 | Discharge: 2019-01-03 | Disposition: A | Discharge status: Home / Self Care | Payer: Medicare Other | Source: Ambulatory Visit | Attending: Nurse Practitioner | Admitting: Nurse Practitioner

## 2019-01-03 DIAGNOSIS — Z1231 Encounter for screening mammogram for malignant neoplasm of breast: Secondary | ICD-10-CM | POA: Insufficient documentation

## 2019-01-06 ENCOUNTER — Other Ambulatory Visit: Payer: Self-pay | Admitting: Nurse Practitioner

## 2019-01-06 DIAGNOSIS — R7309 Other abnormal glucose: Secondary | ICD-10-CM | POA: Diagnosis not present

## 2019-01-06 DIAGNOSIS — E0781 Sick-euthyroid syndrome: Secondary | ICD-10-CM | POA: Diagnosis not present

## 2019-01-06 DIAGNOSIS — I10 Essential (primary) hypertension: Secondary | ICD-10-CM | POA: Diagnosis not present

## 2019-01-06 DIAGNOSIS — Z0001 Encounter for general adult medical examination with abnormal findings: Secondary | ICD-10-CM | POA: Diagnosis not present

## 2019-01-06 DIAGNOSIS — Z13220 Encounter for screening for lipoid disorders: Secondary | ICD-10-CM | POA: Diagnosis not present

## 2019-01-06 DIAGNOSIS — D691 Qualitative platelet defects: Secondary | ICD-10-CM | POA: Diagnosis not present

## 2019-01-06 DIAGNOSIS — R7301 Impaired fasting glucose: Secondary | ICD-10-CM | POA: Diagnosis not present

## 2019-01-06 DIAGNOSIS — R5383 Other fatigue: Secondary | ICD-10-CM | POA: Diagnosis not present

## 2019-01-07 LAB — LIPID PANEL W/O CHOL/HDL RATIO
CHOLESTEROL TOTAL: 230 mg/dL — AB (ref 100–199)
HDL: 48 mg/dL (ref >39)
LDL Calculated: 155 mg/dL — ABNORMAL HIGH (ref 0–99)
Triglycerides: 137 mg/dL (ref 0–149)
VLDL Cholesterol Cal: 27 mg/dL (ref 5–40)

## 2019-01-07 LAB — CBC
Hematocrit: 38.5 % (ref 34.0–46.6)
Hemoglobin: 12.6 g/dL (ref 11.1–15.9)
MCH: 27.8 pg (ref 26.6–33.0)
MCHC: 32.7 g/dL (ref 31.5–35.7)
MCV: 85 fL (ref 79–97)
Platelets: 480 10*3/uL — ABNORMAL HIGH (ref 150–450)
RBC: 4.54 x10E6/uL (ref 3.77–5.28)
RDW: 13.1 % (ref 11.7–15.4)
WBC: 5.5 10*3/uL (ref 3.4–10.8)

## 2019-01-07 LAB — COMPREHENSIVE METABOLIC PANEL
ALT: 21 IU/L (ref 0–32)
AST: 18 IU/L (ref 0–40)
Albumin/Globulin Ratio: 1.9 (ref 1.2–2.2)
Albumin: 4.7 g/dL (ref 3.5–4.8)
Alkaline Phosphatase: 53 IU/L (ref 39–117)
BUN/Creatinine Ratio: 12 (ref 12–28)
BUN: 8 mg/dL (ref 8–27)
Bilirubin Total: 0.4 mg/dL (ref 0.0–1.2)
CALCIUM: 9.7 mg/dL (ref 8.7–10.3)
CO2: 26 mmol/L (ref 20–29)
Chloride: 102 mmol/L (ref 96–106)
Creatinine, Ser: 0.66 mg/dL (ref 0.57–1.00)
GFR calc Af Amer: 102 mL/min/{1.73_m2} (ref >59)
GFR calc non Af Amer: 89 mL/min/{1.73_m2} (ref >59)
Globulin, Total: 2.5 g/dL (ref 1.5–4.5)
Glucose: 106 mg/dL — ABNORMAL HIGH (ref 65–99)
Potassium: 3.8 mmol/L (ref 3.5–5.2)
Sodium: 144 mmol/L (ref 134–144)
Total Protein: 7.2 g/dL (ref 6.0–8.5)

## 2019-01-07 LAB — T4, FREE: Free T4: 1.21 ng/dL (ref 0.82–1.77)

## 2019-01-07 LAB — TSH: TSH: 1.65 u[IU]/mL (ref 0.450–4.500)

## 2019-01-07 LAB — HGB A1C W/O EAG: Hgb A1c MFr Bld: 6 % — ABNORMAL HIGH (ref 4.8–5.6)

## 2019-01-07 LAB — VITAMIN D 25 HYDROXY (VIT D DEFICIENCY, FRACTURES): Vit D, 25-Hydroxy: 31.3 ng/mL (ref 30.0–100.0)

## 2019-01-07 LAB — T3: T3, Total: 121 ng/dL (ref 71–180)

## 2019-01-13 ENCOUNTER — Ambulatory Visit (INDEPENDENT_AMBULATORY_CARE_PROVIDER_SITE_OTHER): Payer: Medicare Other | Admitting: Internal Medicine

## 2019-01-13 ENCOUNTER — Encounter: Payer: Self-pay | Admitting: Internal Medicine

## 2019-01-13 VITALS — BP 128/82 | HR 108 | Resp 16 | Ht 65.0 in | Wt 149.0 lb

## 2019-01-13 DIAGNOSIS — J309 Allergic rhinitis, unspecified: Secondary | ICD-10-CM | POA: Diagnosis not present

## 2019-01-13 DIAGNOSIS — J301 Allergic rhinitis due to pollen: Secondary | ICD-10-CM | POA: Diagnosis not present

## 2019-01-13 DIAGNOSIS — R0602 Shortness of breath: Secondary | ICD-10-CM | POA: Diagnosis not present

## 2019-01-13 DIAGNOSIS — J329 Chronic sinusitis, unspecified: Secondary | ICD-10-CM

## 2019-01-13 NOTE — Patient Instructions (Signed)

## 2019-01-13 NOTE — Progress Notes (Signed)
[facility] [place], [place] [address]  Pulmonary Sleep Medicine   Office Visit Note  Patient Name: Virginia Warner DOB: 03-12-1946 MRN [id number]  Date of Service: 01/13/2019  Complaints/HPI: allergies wheeze former smoker quit 12 years ago patient was referred for evaluation of allergies.  She has a lot of sneezing rhinitis.  Seems to be mostly seasonal.  The patient states that she gets congestion.  She does experience some wheezing also.  Patient has not been tested for allergies.  I suggested that we do get this evaluated to see if indeed there are allergies and then also discussed the treatment options that would be available to her once the studies were done and confirmed.  Denies having any headaches there is no chest pains no palpitations.  She gets a cough.  She does also experience shortness of breath no constant wheeze but occasionally she does admit to having wheezing.  She does have some drainage  ROS  General: (-) fever, (-) chills, (-) night sweats, (-) weakness Skin: (-) rashes, (-) itching,. Eyes: (-) visual changes, (-) redness, (-) itching. Nose and Sinuses: +nasal stuffiness or itchiness, +postnasal drip, (-) nosebleeds, (-) sinus trouble. Mouth and Throat: (-) sore throat, (-) hoarseness. Neck: (-) swollen glands, (-) enlarged thyroid, (-) neck pain. Respiratory: + cough, (-) bloody sputum, + shortness of breath, - wheezing. Cardiovascular: - ankle swelling, (-) chest pain. Lymphatic: (-) lymph node enlargement. Neurologic: (-) numbness, (-) tingling. Psychiatric: (-) anxiety, (-) depression   Current Medication: Outpatient Encounter Medications as of 01/13/2019  Medication Sig Note  . amLODipine (NORVASC) 5 MG tablet Take 1 tablet (5 mg total) by mouth daily.   Marland Kitchen amoxicillin-clavulanate (AUGMENTIN) 875-125 MG tablet Take 1 tablet by mouth 2 (two) times daily.   Marland Kitchen aspirin EC 81 MG tablet Take 81 mg by mouth daily.   Marland Kitchen azithromycin  (ZITHROMAX) 250 MG tablet z-pack - take by mouth as directed for 5 days.   . fluticasone (FLONASE) 50 MCG/ACT nasal spray Place 2 sprays into both nostrils daily.   Marland Kitchen geriatric multivitamins-minerals (ELDERTONIC/GEVRABON) ELIX Take 15 mLs by mouth daily.   Marland Kitchen latanoprost (XALATAN) 0.005 % ophthalmic solution 1 drop at bedtime.   Marland Kitchen lisinopril-hydrochlorothiazide (PRINZIDE,ZESTORETIC) 20-12.5 MG tablet Take 1 tablet by mouth 2 (two) times daily.   Marland Kitchen lovastatin (MEVACOR) 20 MG tablet Take 1 tablet (20 mg total) by mouth at bedtime.   . polyethylene glycol powder (GLYCOLAX/MIRALAX) powder Take as directed for colon prep 02/05/2016: Received from: [place]   No facility-administered encounter medications on file as of 01/13/2019.     Surgical History: Past Surgical History:  Procedure Laterality Date  . ABDOMINAL HYSTERECTOMY    . COLONOSCOPY WITH PROPOFOL N/A 10/18/2015   Procedure: COLONOSCOPY WITH PROPOFOL;  Surgeon: Manya Silvas, MD;  Location: [facility] ENDOSCOPY;  Service: Endoscopy;  Laterality: N/A;  . POLYPECTOMY      Medical History: Past Medical History:  Diagnosis Date  . Hyperlipidemia   . Hypertension   . Seasonal allergies     Family History: Family History  Problem Relation Age of Onset  . Cancer Brother   . Breast cancer Neg Hx     Social History: Social History   Socioeconomic History  . Marital status: Divorced    Spouse name: Not on file  . Number of children: Not on file  . Years of education: Not on file  . Highest education level: Not on file  Occupational History  .  Not on file  Social Needs  . Financial resource strain: Not on file  . Food insecurity:    Worry: Not on file    Inability: Not on file  . Transportation needs:    Medical: Not on file    Non-medical: Not on file  Tobacco Use  . Smoking status: Former Research scientist (life sciences)  . Smokeless tobacco: Never Used  Substance and Sexual Activity  . Alcohol use: Yes    Alcohol/week: 0.0  standard drinks    Comment: ocassionally   . Drug use: No  . Sexual activity: Not on file  Lifestyle  . Physical activity:    Days per week: Not on file    Minutes per session: Not on file  . Stress: Not on file  Relationships  . Social connections:    Talks on phone: Not on file    Gets together: Not on file    Attends religious service: Not on file    Active member of club or organization: Not on file    Attends meetings of clubs or organizations: Not on file    Relationship status: Not on file  . Intimate partner violence:    Fear of current or ex partner: Not on file    Emotionally abused: Not on file    Physically abused: Not on file    Forced sexual activity: Not on file  Other Topics Concern  . Not on file  Social History Narrative  . Not on file    Vital Signs: Blood pressure 128/82, pulse (!) 108, resp. rate 16, height 5\' 5"  (1.651 m), weight 149 lb (67.6 kg), SpO2 96 %.  Examination: General Appearance: The patient is well-developed, well-nourished, and in no distress. Skin: Gross inspection of skin unremarkable. Head: normocephalic, no gross deformities. Eyes: no gross deformities noted. ENT: ears appear grossly normal no exudates. Neck: Supple. No thyromegaly. No LAD. Respiratory: no rhonchi noted at this time. Cardiovascular: Normal S1 and S2 without murmur or rub. Extremities: No cyanosis. pulses are equal. Neurologic: Alert and oriented. No involuntary movements.  LABS: Recent Results (from the past 2160 hour(s))  Comprehensive metabolic panel     Status: Abnormal   Collection Time: 01/06/19  8:30 AM  Result Value Ref Range   Glucose 106 (H) 65 - 99 mg/dL   BUN 8 8 - 27 mg/dL   Creatinine, Ser 0.66 0.57 - 1.00 mg/dL   GFR calc non Af Amer 89 >59 mL/min/1.73   GFR calc Af Amer 102 >59 mL/min/1.73   BUN/Creatinine Ratio 12 12 - 28   Sodium 144 134 - 144 mmol/L   Potassium 3.8 3.5 - 5.2 mmol/L   Chloride 102 96 - 106 mmol/L   CO2 26 20 - 29 mmol/L    Calcium 9.7 8.7 - 10.3 mg/dL   Total Protein 7.2 6.0 - 8.5 g/dL   Albumin 4.7 3.5 - 4.8 g/dL    Comment:     **Effective January 17, 2019 Albumin reference**       interval will be changing to:              Age                Female          Female           0 -  7 days        3.6 - 4.9      3.6 - 4.9  8 - 30 days        3.4 - 4.7      3.4 - 4.7           1 -  6 month       3.7 - 4.8      3.7 - 4.8    7 months -  2 years       3.9 - 5.0      3.9 - 5.0           3 -  5 years       4.0 - 5.0      4.0 - 5.0           6 - 12 years       4.1 - 5.0      4.0 - 5.0          13 - 30 years       4.1 - 5.2      3.9 - 5.0          31 - 50 years       4.0 - 5.0      3.8 - 4.8          51 - 60 years       3.8 - 4.9      3.8 - 4.9          61 - 70 years       3.8 - 4.8      3.8 - 4.8          71 - 80 years       3.7 - 4.7      3.7 - 4.7          81 - 89 years       3.6 - 4.6      3.6 - 4.6              >89 years       3.5 - 4.6      3.5 - 4.6    Globulin, Total 2.5 1.5 - 4.5 g/dL   Albumin/Globulin Ratio 1.9 1.2 - 2.2   Bilirubin Total 0.4 0.0 - 1.2 mg/dL   Alkaline Phosphatase 53 39 - 117 IU/L   AST 18 0 - 40 IU/L   ALT 21 0 - 32 IU/L  CBC     Status: Abnormal   Collection Time: 01/06/19  8:30 AM  Result Value Ref Range   WBC 5.5 3.4 - 10.8 x10E3/uL   RBC 4.54 3.77 - 5.28 x10E6/uL   Hemoglobin 12.6 11.1 - 15.9 g/dL   Hematocrit 38.5 34.0 - 46.6 %   MCV 85 79 - 97 fL   MCH 27.8 26.6 - 33.0 pg   MCHC 32.7 31.5 - 35.7 g/dL   RDW 13.1 11.7 - 15.4 %    Comment:               **Please note reference interval change**   Platelets 480 (H) 150 - 450 x10E3/uL  Lipid Panel w/o Chol/HDL Ratio     Status: Abnormal   Collection Time: 01/06/19  8:30 AM  Result Value Ref Range   Cholesterol, Total 230 (H) 100 - 199 mg/dL   Triglycerides 137 0 - 149 mg/dL   HDL 48 >39 mg/dL   VLDL Cholesterol Cal 27 5 - 40 mg/dL   LDL Calculated 155 (H) 0 - 99 mg/dL  Hgb A1c w/o eAG  Status: Abnormal    Collection Time: 01/06/19  8:30 AM  Result Value Ref Range   Hgb A1c MFr Bld 6.0 (H) 4.8 - 5.6 %    Comment:          Prediabetes: 5.7 - 6.4          Diabetes: >6.4          Glycemic control for adults with diabetes: <7.0   T4, free     Status: None   Collection Time: 01/06/19  8:30 AM  Result Value Ref Range   Free T4 1.21 0.82 - 1.77 ng/dL  TSH     Status: None   Collection Time: 01/06/19  8:30 AM  Result Value Ref Range   TSH 1.650 0.450 - 4.500 uIU/mL  VITAMIN D 25 Hydroxy (Vit-D Deficiency, Fractures)     Status: None   Collection Time: 01/06/19  8:30 AM  Result Value Ref Range   Vit D, 25-Hydroxy 31.3 30.0 - 100.0 ng/mL    Comment: Vitamin D deficiency has been defined by the [place] and an Endocrine Society practice guideline as a level of serum 25-OH vitamin D less than 20 ng/mL (1,2). The Endocrine Society went on to further define vitamin D insufficiency as a level between 21 and 29 ng/mL (2). 1. IOM (Institute of Medicine). 2010. Dietary reference    intakes for calcium and D. [place]: The    [employer]. 2. Holick MF, [place], Bischoff-Ferrari HA, et al.    Evaluation, treatment, and prevention of vitamin D    deficiency: an Endocrine Society clinical practice    guideline. JCEM. 2011 Jul; 96(7):1911-30.   T3     Status: None   Collection Time: 01/06/19  8:30 AM  Result Value Ref Range   T3, Total 121 71 - 180 ng/dL    Radiology: Mm 3d Screen Breast Bilateral  Result Date: 01/03/2019 CLINICAL DATA:  Screening. EXAM: DIGITAL SCREENING BILATERAL MAMMOGRAM WITH TOMO AND CAD COMPARISON:  Previous exam(s). ACR Breast Density Category b: There are scattered areas of fibroglandular density. FINDINGS: There are no findings suspicious for malignancy. Images were processed with CAD. IMPRESSION: No mammographic evidence of malignancy. A result letter of this screening mammogram will be mailed directly to the patient. RECOMMENDATION:  Screening mammogram in one year. (Code:SM-B-01Y) BI-RADS CATEGORY  1: Negative. Electronically Signed   By: Fidela Salisbury M.D.   On: 01/03/2019 12:55    No results found.  Mm 3d Screen Breast Bilateral  Result Date: 01/03/2019 CLINICAL DATA:  Screening. EXAM: DIGITAL SCREENING BILATERAL MAMMOGRAM WITH TOMO AND CAD COMPARISON:  Previous exam(s). ACR Breast Density Category b: There are scattered areas of fibroglandular density. FINDINGS: There are no findings suspicious for malignancy. Images were processed with CAD. IMPRESSION: No mammographic evidence of malignancy. A result letter of this screening mammogram will be mailed directly to the patient. RECOMMENDATION: Screening mammogram in one year. (Code:SM-B-01Y) BI-RADS CATEGORY  1: Negative. Electronically Signed   By: Fidela Salisbury M.D.   On: 01/03/2019 12:55      Assessment and Plan: Patient Active Problem List   Diagnosis Date Noted  . Encounter for general adult medical examination with abnormal findings 12/27/2018  . Chronic allergic rhinitis 12/27/2018  . Lymphadenopathy 12/24/2018  . Acute non-recurrent sinusitis 07/05/2018  . Essential (primary) hypertension 01/18/2018  . Mixed hyperlipidemia 01/18/2018    1. Allergic rhinitis appears to be a chronic issue she does have significant symptoms I think she would benefit  from allergy testing once this is done we will have a better idea of exactly what is she allergic to them and then we can approach treatment accordingly.  The risks and benefits of the allergy test were discussed with her at length. 2. Recurring sinusitis again this may be related to above so we will once we get the allergy testing done then hopefully if she is started on immunotherapy that this would result in improvement of her general symptoms. 3. Shortness of breath she may actually also benefit from evaluation of pulmonary functions to make sure that she does not have a reversible airway component to her  symptoms.  General Counseling: I have discussed the findings of the evaluation and examination with Vaughan Basta.  I have also discussed any further diagnostic evaluation thatmay be needed or ordered today. Ornella verbalizes understanding of the findings of todays visit. We also reviewed her medications today and discussed drug interactions and side effects including but not limited excessive drowsiness and altered mental states. We also discussed that there is always a risk not just to her but also people around her. she has been encouraged to call the office with any questions or concerns that should arise related to todays visit.    Time spent: 40 minutes  I have personally obtained a history, examined the patient, evaluated laboratory and imaging results, formulated the assessment and plan and placed orders.    Allyne Gee, MD [facility] Pulmonary and Critical Care Sleep medicine

## 2019-01-31 ENCOUNTER — Ambulatory Visit (INDEPENDENT_AMBULATORY_CARE_PROVIDER_SITE_OTHER): Payer: Medicare Other | Admitting: Adult Health

## 2019-01-31 ENCOUNTER — Encounter: Payer: Self-pay | Admitting: Adult Health

## 2019-01-31 VITALS — BP 173/85 | HR 128 | Temp 96.1°F | Resp 16 | Ht 65.0 in | Wt 149.0 lb

## 2019-01-31 DIAGNOSIS — J309 Allergic rhinitis, unspecified: Secondary | ICD-10-CM | POA: Diagnosis not present

## 2019-01-31 DIAGNOSIS — R000 Tachycardia, unspecified: Secondary | ICD-10-CM | POA: Diagnosis not present

## 2019-01-31 DIAGNOSIS — I10 Essential (primary) hypertension: Secondary | ICD-10-CM

## 2019-01-31 NOTE — Progress Notes (Signed)
[facility] [place],  [address]  Internal MEDICINE  Office Visit Note  Patient Name: Virginia Warner  [id number]  [id number]  Date of Service: 02/21/2019  Chief Complaint  Patient presents with  . Cough  . Allergies  . Tachycardia     HPI Pt is here for a sick visit.  Patient is here reporting rhinorrhea, cough, sinus pressure, eye itching, tachycardia as well as hypertension.  Patient reports some dyspnea on exertion and some chest tightness at times.  Patient is very anxious upon exam in room.  Will get EKG at this time.     Current Medication:  Outpatient Encounter Medications as of 01/31/2019  Medication Sig Note  . amLODipine (NORVASC) 5 MG tablet Take 1 tablet (5 mg total) by mouth daily.   Marland Kitchen amoxicillin-clavulanate (AUGMENTIN) 875-125 MG tablet Take 1 tablet by mouth 2 (two) times daily.   Marland Kitchen aspirin EC 81 MG tablet Take 81 mg by mouth daily.   Marland Kitchen azithromycin (ZITHROMAX) 250 MG tablet z-pack - take by mouth as directed for 5 days.   . fluticasone (FLONASE) 50 MCG/ACT nasal spray Place 2 sprays into both nostrils daily.   Marland Kitchen geriatric multivitamins-minerals (ELDERTONIC/GEVRABON) ELIX Take 15 mLs by mouth daily.   Marland Kitchen latanoprost (XALATAN) 0.005 % ophthalmic solution 1 drop at bedtime.   Marland Kitchen lisinopril-hydrochlorothiazide (PRINZIDE,ZESTORETIC) 20-12.5 MG tablet Take 1 tablet by mouth 2 (two) times daily.   Marland Kitchen lovastatin (MEVACOR) 20 MG tablet Take 1 tablet (20 mg total) by mouth at bedtime.   . polyethylene glycol powder (GLYCOLAX/MIRALAX) powder Take as directed for colon prep 02/05/2016: Received from: [place]   No facility-administered encounter medications on file as of 01/31/2019.       Medical History: Past Medical History:  Diagnosis Date  . Hyperlipidemia   . Hypertension   . Seasonal allergies      Vital Signs: BP (!) 173/85   Pulse (!) 128   Temp (!) 96.1 F (35.6 C)   Resp 16   Ht 5\' 5"  (1.651 m)    Wt 149 lb (67.6 kg)   SpO2 97%   BMI 24.79 kg/m    Review of Systems  Constitutional: Negative for chills, fatigue and unexpected weight change.  HENT: Negative for congestion, rhinorrhea, sneezing and sore throat.   Eyes: Negative for photophobia, pain and redness.  Respiratory: Negative for cough, chest tightness and shortness of breath.   Cardiovascular: Negative for chest pain and palpitations.  Gastrointestinal: Negative for abdominal pain, constipation, diarrhea, nausea and vomiting.  Endocrine: Negative.   Genitourinary: Negative for dysuria and frequency.  Musculoskeletal: Negative for arthralgias, back pain, joint swelling and neck pain.  Skin: Negative for rash.  Allergic/Immunologic: Negative.   Neurological: Negative for tremors and numbness.  Hematological: Negative for adenopathy. Does not bruise/bleed easily.  Psychiatric/Behavioral: Negative for behavioral problems and sleep disturbance. The patient is not nervous/anxious.     Physical Exam Vitals signs and nursing note reviewed.  Constitutional:      General: She is not in acute distress.    Appearance: She is well-developed. She is not diaphoretic.  HENT:     Head: Normocephalic and atraumatic.     Mouth/Throat:     Pharynx: No oropharyngeal exudate.  Eyes:     Pupils: Pupils are equal, round, and reactive to light.  Neck:     Musculoskeletal: Normal range of motion and neck supple.     Thyroid: No thyromegaly.  Vascular: No JVD.     Trachea: No tracheal deviation.  Cardiovascular:     Rate and Rhythm: Normal rate and regular rhythm.     Heart sounds: Normal heart sounds. No murmur. No friction rub. No gallop.   Pulmonary:     Effort: Pulmonary effort is normal. No respiratory distress.     Breath sounds: Normal breath sounds. No wheezing or rales.  Chest:     Chest wall: No tenderness.  Abdominal:     Palpations: Abdomen is soft.     Tenderness: There is no abdominal tenderness. There is no  guarding.  Musculoskeletal: Normal range of motion.  Lymphadenopathy:     Cervical: No cervical adenopathy.  Skin:    General: Skin is warm and dry.  Neurological:     Mental Status: She is alert and oriented to person, place, and time.     Cranial Nerves: No cranial nerve deficit.  Psychiatric:        Behavior: Behavior normal.        Thought Content: Thought content normal.        Judgment: Judgment normal.    Assessment/Plan: 1. Essential (primary) hypertension Patient's blood pressure elevated however I believe it is due to being in the office as well as her anxiety and taking of O2 medications for her allergies.  Will have patient take her blood pressure over next day or 2 days if it is different at home.  2. Rapid heart rate Patient tachycardic however very anxious this time.  Continue to monitor future visits.  Will have patient call with blood sugar and pulse rates over the next 2 days.  EKG normal by my read.  I believe at this time her rapid heart rate is due to anxiety and OT medication she has been taking for her chronic allergic rhinitis.  - EKG 12-Lead  3. Chronic allergic rhinitis Continue using medications as prescribed.  General Counseling: yacine droz understanding of the findings of todays visit and agrees with plan of treatment. I have discussed any further diagnostic evaluation that may be needed or ordered today. We also reviewed her medications today. she has been encouraged to call the office with any questions or concerns that should arise related to todays visit.   Orders Placed This Encounter  Procedures  . EKG 12-Lead    No orders of the defined types were placed in this encounter.   Time spent: 25 Minutes  This patient was seen by Orson Gear AGNP-C in Collaboration with Dr [name] as a part of collaborative care agreement.  Kendell Bane AGNP-C Internal Medicine

## 2019-02-01 ENCOUNTER — Other Ambulatory Visit: Payer: Medicare Other

## 2019-02-01 ENCOUNTER — Telehealth: Payer: Self-pay | Admitting: Adult Health

## 2019-02-14 ENCOUNTER — Ambulatory Visit: Payer: Self-pay | Admitting: Internal Medicine

## 2019-02-16 ENCOUNTER — Ambulatory Visit: Payer: Self-pay | Admitting: Adult Health

## 2019-02-21 ENCOUNTER — Encounter: Payer: Self-pay | Admitting: Adult Health

## 2019-02-21 ENCOUNTER — Ambulatory Visit (INDEPENDENT_AMBULATORY_CARE_PROVIDER_SITE_OTHER): Payer: Medicare Other | Admitting: Adult Health

## 2019-02-21 VITALS — BP 158/83 | HR 116 | Temp 96.7°F | Resp 16 | Ht 65.0 in | Wt 149.0 lb

## 2019-02-21 DIAGNOSIS — J301 Allergic rhinitis due to pollen: Secondary | ICD-10-CM

## 2019-02-21 DIAGNOSIS — R000 Tachycardia, unspecified: Secondary | ICD-10-CM

## 2019-02-21 DIAGNOSIS — K219 Gastro-esophageal reflux disease without esophagitis: Secondary | ICD-10-CM | POA: Diagnosis not present

## 2019-02-21 DIAGNOSIS — I10 Essential (primary) hypertension: Secondary | ICD-10-CM

## 2019-02-21 MED ORDER — ESOMEPRAZOLE MAGNESIUM 20 MG PO CPDR: 20.0000 mg | DELAYED_RELEASE_CAPSULE | Freq: Every day | ORAL | 1 refills | Status: DC

## 2019-02-21 NOTE — Progress Notes (Signed)
[facility] [place],  [address]  Internal MEDICINE  Office Visit Note  Patient Name: Virginia Warner  [id number]  [id number]  Date of Service: 02/21/2019  Chief Complaint  Patient presents with  . Hypertension    Pt pulse was still high and also Bp high and stated that she had glass of wine at lunch  . Sinusitis    HPI  Pt is here for follow up on BP.  She reports she gets very anxious just from the idea of coming to the doctor.  She was so nervous today she had a glass of wine at lunch.  Her BP initially was 158/83.  Rechecked it was found to be 150/70. Her pulse was initially 116, and now is 90 bpm.  Patient is very anxious about coming to the doctor's office for no apparent reason.  At last visit she was hypertensive also and called the next day after checking her blood pressure at home with a normal result.  I encouraged her to take her blood pressure at [place] twice a day and document this in a log and bring to her next visit.  She is adamant that her pressure is fine at home and we will verify log.   Current Medication: Outpatient Encounter Medications as of 02/21/2019  Medication Sig Note  . amLODipine (NORVASC) 5 MG tablet Take 1 tablet (5 mg total) by mouth daily.   Marland Kitchen amoxicillin-clavulanate (AUGMENTIN) 875-125 MG tablet Take 1 tablet by mouth 2 (two) times daily.   Marland Kitchen aspirin EC 81 MG tablet Take 81 mg by mouth daily.   Marland Kitchen azithromycin (ZITHROMAX) 250 MG tablet z-pack - take by mouth as directed for 5 days.   . fluticasone (FLONASE) 50 MCG/ACT nasal spray Place 2 sprays into both nostrils daily.   Marland Kitchen geriatric multivitamins-minerals (ELDERTONIC/GEVRABON) ELIX Take 15 mLs by mouth daily.   Marland Kitchen latanoprost (XALATAN) 0.005 % ophthalmic solution 1 drop at bedtime.   Marland Kitchen lisinopril-hydrochlorothiazide (PRINZIDE,ZESTORETIC) 20-12.5 MG tablet Take 1 tablet by mouth 2 (two) times daily.   Marland Kitchen lovastatin (MEVACOR) 20 MG tablet Take 1 tablet (20 mg total) by mouth  at bedtime.   . polyethylene glycol powder (GLYCOLAX/MIRALAX) powder Take as directed for colon prep 02/05/2016: Received from: [facility]  . esomeprazole (NEXIUM) 20 MG capsule Take 1 capsule (20 mg total) by mouth daily at 12 noon.    No facility-administered encounter medications on file as of 02/21/2019.     Surgical History: Past Surgical History:  Procedure Laterality Date  . ABDOMINAL HYSTERECTOMY    . COLONOSCOPY WITH PROPOFOL N/A 10/18/2015   Procedure: COLONOSCOPY WITH PROPOFOL;  Surgeon: Manya Silvas, MD;  Location: [facility] ENDOSCOPY;  Service: Endoscopy;  Laterality: N/A;  . POLYPECTOMY      Medical History: Past Medical History:  Diagnosis Date  . Hyperlipidemia   . Hypertension   . Seasonal allergies     Family History: Family History  Problem Relation Age of Onset  . Cancer Brother   . Breast cancer Neg Hx     Social History   Socioeconomic History  . Marital status: Divorced    Spouse name: Not on file  . Number of children: Not on file  . Years of education: Not on file  . Highest education level: Not on file  Occupational History  . Not on file  Social Needs  . Financial resource strain: Not on file  . Food insecurity:    Worry: Not  on file    Inability: Not on file  . Transportation needs:    Medical: Not on file    Non-medical: Not on file  Tobacco Use  . Smoking status: Former Research scientist (life sciences)  . Smokeless tobacco: Never Used  Substance and Sexual Activity  . Alcohol use: Yes    Alcohol/week: 0.0 standard drinks    Comment: ocassionally   . Drug use: No  . Sexual activity: Not on file  Lifestyle  . Physical activity:    Days per week: Not on file    Minutes per session: Not on file  . Stress: Not on file  Relationships  . Social connections:    Talks on phone: Not on file    Gets together: Not on file    Attends religious service: Not on file    Active member of club or organization: Not on file    Attends meetings of  clubs or organizations: Not on file    Relationship status: Not on file  . Intimate partner violence:    Fear of current or ex partner: Not on file    Emotionally abused: Not on file    Physically abused: Not on file    Forced sexual activity: Not on file  Other Topics Concern  . Not on file  Social History Narrative  . Not on file      Review of Systems  Constitutional: Negative for chills, fatigue and unexpected weight change.  HENT: Negative for congestion, rhinorrhea, sneezing and sore throat.   Eyes: Negative for photophobia, pain and redness.  Respiratory: Negative for cough, chest tightness and shortness of breath.   Cardiovascular: Negative for chest pain and palpitations.  Gastrointestinal: Negative for abdominal pain, constipation, diarrhea, nausea and vomiting.  Endocrine: Negative.   Genitourinary: Negative for dysuria and frequency.  Musculoskeletal: Negative for arthralgias, back pain, joint swelling and neck pain.  Skin: Negative for rash.  Allergic/Immunologic: Negative.   Neurological: Negative for tremors and numbness.  Hematological: Negative for adenopathy. Does not bruise/bleed easily.  Psychiatric/Behavioral: Negative for behavioral problems and sleep disturbance. The patient is not nervous/anxious.     Vital Signs: BP (!) 158/83   Pulse (!) 116   Temp (!) 96.7 F (35.9 C)   Resp 16   Ht 5\' 5"  (1.651 m)   Wt 149 lb (67.6 kg)   SpO2 99%   BMI 24.79 kg/m    Physical Exam Vitals signs and nursing note reviewed.  Constitutional:      General: She is not in acute distress.    Appearance: She is well-developed. She is not diaphoretic.  HENT:     Head: Normocephalic and atraumatic.     Mouth/Throat:     Pharynx: No oropharyngeal exudate.  Eyes:     Pupils: Pupils are equal, round, and reactive to light.  Neck:     Musculoskeletal: Normal range of motion and neck supple.     Thyroid: No thyromegaly.     Vascular: No JVD.     Trachea: No  tracheal deviation.  Cardiovascular:     Rate and Rhythm: Normal rate and regular rhythm.     Heart sounds: Normal heart sounds. No murmur. No friction rub. No gallop.   Pulmonary:     Effort: Pulmonary effort is normal. No respiratory distress.     Breath sounds: Normal breath sounds. No wheezing or rales.  Chest:     Chest wall: No tenderness.  Abdominal:     Palpations: Abdomen is soft.  Tenderness: There is no abdominal tenderness. There is no guarding.  Musculoskeletal: Normal range of motion.  Lymphadenopathy:     Cervical: No cervical adenopathy.  Skin:    General: Skin is warm and dry.  Neurological:     Mental Status: She is alert and oriented to person, place, and time.     Cranial Nerves: No cranial nerve deficit.  Psychiatric:        Behavior: Behavior normal.        Thought Content: Thought content normal.        Judgment: Judgment normal.    Assessment/Plan: 1. Non-seasonal allergic rhinitis due to pollen Patient continues to report allergy symptoms.  Encourage patient to continue to use over-the-counter medications such as Allegra to help with her sinus issues as a result of pollen.  She will return to clinic if she gets a fever or any other signs and symptoms of infection.  2. Gastroesophageal reflux disease without esophagitis Patient will start taking Nexium for GERD symptoms.  We discussed that the symptoms could be contributing to some cough and sore throat.  Follow-up as scheduled and we will see how her results of the medication are doing. - esomeprazole (NEXIUM) 20 MG capsule; Take 1 capsule (20 mg total) by mouth daily at 12 noon.  Dispense: 30 capsule; Refill: 1  3. Rapid heart rate Patient tachycardic at this visit.  Initially she is 116 bpm however after sitting in exam room for a while and talking with me her pulse went down to 90.  Will be to monitor future.  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  4. Essential (primary) hypertension As above patient's been over 158/83.  Slightly elevated, most likely due to coming into the office.  Patient is getting normal blood pressures at home and we will address via a log at her next visit.  General Counseling: davona kinoshita understanding of the findings of todays visit and agrees with plan of treatment. I have discussed any further diagnostic evaluation that may be needed or ordered today. We also reviewed her medications today. she has been encouraged to call the office with any questions or concerns that should arise related to todays visit.    No orders of the defined types were placed in this encounter.   Meds ordered this encounter  Medications  . esomeprazole (NEXIUM) 20 MG capsule    Sig: Take 1 capsule (20 mg total) by mouth daily at 12 noon.    Dispense:  30 capsule    Refill:  1    Time spent: 25 Minutes   This patient was seen by Orson Gear AGNP-C in Collaboration with Dr [name] as a part of collaborative care agreement     Kendell Bane AGNP-C Internal medicine

## 2019-02-21 NOTE — Patient Instructions (Signed)
Allergies, Adult  An allergy means that your body reacts to something that bothers it (allergen). It is not a normal reaction. This can happen from something that you:   Eat.   Breathe in.   Touch.  You can have an allergy (be allergic) to:   Outdoor things, like:  ? Pollen.  ? Grass.  ? Weeds.   Indoor things, like:  ? Dust.  ? Smoke.  ? Pet dander.   Foods.   Medicines.   Things that bother your skin, like:  ? Detergents.  ? Chemicals.  ? Latex.   Perfume.   Bugs.  An allergy cannot spread from person to person (is not contagious).  Follow these instructions at home:               Stay away from things that you know you are allergic to.   If you have allergies to things in the air, wash out your nose each day. Do it with one of these:  ? A salt-water (saline) spray.  ? A container (neti pot).   Take over-the-counter and prescription medicines only as told by your doctor.   Keep all follow-up visits as told by your doctor. This is important.   If you are at risk for a very bad allergy reaction (anaphylaxis), keep an auto-injector with you all the time. This is called an epinephrine injection.  ? This is pre-measured medicine with a needle. You can put it into your skin by yourself.  ? Right after you have a very bad allergy reaction, you or a person with you must give the medicine in less than a few minutes. This is an emergency.   If you have ever had a very bad allergy reaction, wear a medical alert bracelet or necklace. Your very bad allergy should be written on it.  Contact a health care provider if:   Your symptoms do not get better with treatment.  Get help right away if:   You have symptoms of a very bad allergy reaction. These include:  ? A swollen mouth, tongue, or throat.  ? Pain or tightness in your chest.  ? Trouble breathing.  ? Being short of breath.  ? Dizziness.  ? Fainting.  ? Very bad pain in your belly (abdomen).  ? Throwing up (vomiting).  ? Watery poop  (diarrhea).  Summary   An allergy means that your body reacts to something that bothers it (allergen). It is not a normal reaction.   Stay away from things that make your body react.   Take over-the-counter and prescription medicines only as told by your doctor.   If you are at risk for a very bad allergy reaction, carry an auto-injector (epinephrine injection) all the time. Also, wear a medical alert bracelet or necklace so people know about your allergy.  This information is not intended to replace advice given to you by your health care provider. Make sure you discuss any questions you have with your health care provider.  Document Released: 04/11/2013 Document Revised: 03/30/2017 Document Reviewed: 03/30/2017  Elsevier Interactive Patient Education  2019 Elsevier Inc[employer]

## 2019-02-22 DIAGNOSIS — H40153 Residual stage of open-angle glaucoma, bilateral: Secondary | ICD-10-CM | POA: Diagnosis not present

## 2019-03-02 ENCOUNTER — Inpatient Hospital Stay: Admission: RE | Admit: 2019-03-02 | Payer: Medicare Other | Source: Ambulatory Visit

## 2019-03-15 ENCOUNTER — Other Ambulatory Visit: Payer: Self-pay | Admitting: Adult Health

## 2019-03-15 DIAGNOSIS — K219 Gastro-esophageal reflux disease without esophagitis: Secondary | ICD-10-CM

## 2019-03-15 DIAGNOSIS — I10 Essential (primary) hypertension: Secondary | ICD-10-CM

## 2019-03-15 MED ORDER — LISINOPRIL-HYDROCHLOROTHIAZIDE 20-12.5 MG PO TABS: 1.0000 | ORAL_TABLET | Freq: Two times a day (BID) | ORAL | 3 refills | Status: DC

## 2019-03-15 MED ORDER — AMLODIPINE BESYLATE 5 MG PO TABS: 5.0000 mg | ORAL_TABLET | Freq: Every day | ORAL | 3 refills | Status: DC

## 2019-03-15 MED ORDER — FLUTICASONE PROPIONATE 50 MCG/ACT NA SUSP: 2.0000 | Freq: Every day | NASAL | 3 refills | Status: DC

## 2019-04-13 ENCOUNTER — Other Ambulatory Visit: Payer: Medicare Other

## 2019-06-20 ENCOUNTER — Inpatient Hospital Stay: Admission: RE | Admit: 2019-06-20 | Payer: Medicare Other | Source: Ambulatory Visit

## 2019-06-27 ENCOUNTER — Ambulatory Visit: Payer: Self-pay | Admitting: Nurse Practitioner

## 2019-07-15 ENCOUNTER — Ambulatory Visit: Payer: Medicare Other | Admitting: Nurse Practitioner

## 2019-07-15 ENCOUNTER — Other Ambulatory Visit: Payer: Self-pay

## 2019-07-18 ENCOUNTER — Ambulatory Visit: Payer: Medicare Other | Admitting: Adult Health

## 2019-07-18 ENCOUNTER — Other Ambulatory Visit: Payer: Self-pay

## 2019-07-18 ENCOUNTER — Encounter: Payer: Self-pay | Admitting: Adult Health

## 2019-07-18 DIAGNOSIS — K219 Gastro-esophageal reflux disease without esophagitis: Secondary | ICD-10-CM | POA: Diagnosis not present

## 2019-07-18 DIAGNOSIS — J301 Allergic rhinitis due to pollen: Secondary | ICD-10-CM

## 2019-07-18 DIAGNOSIS — I10 Essential (primary) hypertension: Secondary | ICD-10-CM

## 2019-07-18 NOTE — Progress Notes (Signed)
[facility] [place], [place] [address]  Internal MEDICINE  Telephone Visit  Patient Name: Virginia Warner  [id number]  [id number]  Date of Service: 07/18/2019  I connected with the patient at 1540 by telephone and verified the patients identity using two identifiers.   I discussed the limitations, risks, security and privacy concerns of performing an evaluation and management service by telephone and the availability of in person appointments. I also discussed with the patient that there may be a patient responsible charge related to the service.  The patient expressed understanding and agrees to proceed.    Chief Complaint  Patient presents with  . Telephone Screen  . Hypertension  . Sinusitis  . Telephone Assessment    HPI  PT is seen via telephone for htn.  She reports her bp has been good at home.  She also has not had any episodes of tachycardia. She has a little anxiety surrounding the covid situation.  She is also reporting allergy symptoms that are controlled with allegra.  She is also using Flonase.  She says if she misses a few doses her sinus pain and pressure returns.      Current Medication: Outpatient Encounter Medications as of 07/18/2019  Medication Sig Note  . amLODipine (NORVASC) 5 MG tablet Take 1 tablet (5 mg total) by mouth daily.   Marland Kitchen aspirin EC 81 MG tablet Take 81 mg by mouth daily.   Marland Kitchen esomeprazole (NEXIUM) 20 MG capsule TAKE 1 CAPSULE EVERY DAY AT 12 NOON   . fluticasone (FLONASE) 50 MCG/ACT nasal spray Place 2 sprays into both nostrils daily.   Marland Kitchen geriatric multivitamins-minerals (ELDERTONIC/GEVRABON) ELIX Take 15 mLs by mouth daily.   Marland Kitchen latanoprost (XALATAN) 0.005 % ophthalmic solution 1 drop at bedtime.   Marland Kitchen lisinopril-hydrochlorothiazide (PRINZIDE,ZESTORETIC) 20-12.5 MG tablet Take 1 tablet by mouth 2 (two) times daily.   Marland Kitchen lovastatin (MEVACOR) 20 MG tablet Take 1 tablet (20 mg total) by mouth at bedtime.   . [DISCONTINUED]  amoxicillin-clavulanate (AUGMENTIN) 875-125 MG tablet Take 1 tablet by mouth 2 (two) times daily. (Patient not taking: Reported on 07/18/2019)   . [DISCONTINUED] azithromycin (ZITHROMAX) 250 MG tablet z-pack - take by mouth as directed for 5 days. (Patient not taking: Reported on 07/18/2019)   . [DISCONTINUED] polyethylene glycol powder (GLYCOLAX/MIRALAX) powder Take as directed for colon prep 02/05/2016: Received from: [place]   No facility-administered encounter medications on file as of 07/18/2019.     Surgical History: Past Surgical History:  Procedure Laterality Date  . ABDOMINAL HYSTERECTOMY    . COLONOSCOPY WITH PROPOFOL N/A 10/18/2015   Procedure: COLONOSCOPY WITH PROPOFOL;  Surgeon: Manya Silvas, MD;  Location: [facility] ENDOSCOPY;  Service: Endoscopy;  Laterality: N/A;  . POLYPECTOMY      Medical History: Past Medical History:  Diagnosis Date  . Hyperlipidemia   . Hypertension   . Seasonal allergies     Family History: Family History  Problem Relation Age of Onset  . Cancer Brother   . Breast cancer Neg Hx     Social History   Socioeconomic History  . Marital status: Divorced    Spouse name: Not on file  . Number of children: Not on file  . Years of education: Not on file  . Highest education level: Not on file  Occupational History  . Not on file  Social Needs  . Financial resource strain: Not on file  . Food insecurity    Worry: Not on  file    Inability: Not on file  . Transportation needs    Medical: Not on file    Non-medical: Not on file  Tobacco Use  . Smoking status: Former Research scientist (life sciences)  . Smokeless tobacco: Never Used  Substance and Sexual Activity  . Alcohol use: Yes    Alcohol/week: 0.0 standard drinks    Comment: ocassionally   . Drug use: No  . Sexual activity: Not on file  Lifestyle  . Physical activity    Days per week: Not on file    Minutes per session: Not on file  . Stress: Not on file  Relationships  . Social  Herbalist on phone: Not on file    Gets together: Not on file    Attends religious service: Not on file    Active member of club or organization: Not on file    Attends meetings of clubs or organizations: Not on file    Relationship status: Not on file  . Intimate partner violence    Fear of current or ex partner: Not on file    Emotionally abused: Not on file    Physically abused: Not on file    Forced sexual activity: Not on file  Other Topics Concern  . Not on file  Social History Narrative  . Not on file      Review of Systems  Constitutional: Negative for chills, fatigue and unexpected weight change.  HENT: Negative for congestion, rhinorrhea, sneezing and sore throat.   Eyes: Negative for photophobia, pain and redness.  Respiratory: Negative for cough, chest tightness and shortness of breath.   Cardiovascular: Negative for chest pain and palpitations.  Gastrointestinal: Negative for abdominal pain, constipation, diarrhea, nausea and vomiting.  Endocrine: Negative.   Genitourinary: Negative for dysuria and frequency.  Musculoskeletal: Negative for arthralgias, back pain, joint swelling and neck pain.  Skin: Negative for rash.  Allergic/Immunologic: Negative.   Neurological: Negative for tremors and numbness.  Hematological: Negative for adenopathy. Does not bruise/bleed easily.  Psychiatric/Behavioral: Negative for behavioral problems and sleep disturbance. The patient is not nervous/anxious.     Vital Signs: There were no vitals taken for this visit.   Observation/Objective: Well appearing, NAD noted at this time.    Assessment/Plan: 1. Essential (primary) hypertension Stable, continue current regimen.   2. Gastroesophageal reflux disease without esophagitis Continue GERD medicine.   3. Non-seasonal allergic rhinitis due to pollen Stable, continue Flonase and allegra.   General Counseling: delila kuklinski understanding of the findings of  today's phone visit and agrees with plan of treatment. I have discussed any further diagnostic evaluation that may be needed or ordered today. We also reviewed her medications today. she has been encouraged to call the office with any questions or concerns that should arise related to todays visit.    No orders of the defined types were placed in this encounter.   No orders of the defined types were placed in this encounter.   Time spent: [facility] Internal medicine

## 2019-08-08 ENCOUNTER — Other Ambulatory Visit: Payer: Self-pay | Admitting: Nurse Practitioner

## 2019-08-08 DIAGNOSIS — I10 Essential (primary) hypertension: Secondary | ICD-10-CM

## 2019-08-08 MED ORDER — LISINOPRIL-HYDROCHLOROTHIAZIDE 20-12.5 MG PO TABS: 1.0000 | ORAL_TABLET | Freq: Two times a day (BID) | ORAL | 5 refills | Status: DC

## 2019-08-08 MED ORDER — AMLODIPINE BESYLATE 5 MG PO TABS: 5.0000 mg | ORAL_TABLET | Freq: Every day | ORAL | 5 refills | Status: DC

## 2019-09-07 NOTE — Telephone Encounter (Signed)
DONE

## 2019-09-08 ENCOUNTER — Ambulatory Visit: Payer: Medicare Other | Admitting: Adult Health

## 2019-09-13 ENCOUNTER — Ambulatory Visit (INDEPENDENT_AMBULATORY_CARE_PROVIDER_SITE_OTHER): Payer: Medicare Other | Admitting: Nurse Practitioner

## 2019-09-13 ENCOUNTER — Encounter: Payer: Self-pay | Admitting: Nurse Practitioner

## 2019-09-13 ENCOUNTER — Other Ambulatory Visit: Payer: Self-pay

## 2019-09-13 VITALS — BP 140/70 | HR 102 | Temp 97.7°F | Resp 16 | Ht 65.0 in | Wt 143.0 lb

## 2019-09-13 DIAGNOSIS — K219 Gastro-esophageal reflux disease without esophagitis: Secondary | ICD-10-CM

## 2019-09-13 DIAGNOSIS — E782 Mixed hyperlipidemia: Secondary | ICD-10-CM

## 2019-09-13 DIAGNOSIS — I10 Essential (primary) hypertension: Secondary | ICD-10-CM

## 2019-09-13 NOTE — Progress Notes (Signed)
[facility] [place], [place] [address]  Internal MEDICINE the [facility] Office Visit Note  Patient Name: Virginia Warner  [id number]  [id number]  Date of Service: 09/22/2019  Chief Complaint  Patient presents with  . Hypertension  . Hyperlipidemia  . Quality Metric Gaps    pt has not gotten her dexa scan    The patient is here for follow up visit. Blood pressure is a bit elevated today. She states that her blood pressure is generally elevated when she first gets to the office, but at home, the blood pressure is generally normal. She states that she worries a little bit more now. She has had several friends who have passed away recently. She states that she is worrying about every little thing which is popping up now. She has a few bruises which are showing up and they are in normal places without other abnormalities.       Current Medication: Outpatient Encounter Medications as of 09/13/2019  Medication Sig Note  . amLODipine (NORVASC) 5 MG tablet Take 1 tablet (5 mg total) by mouth daily.   Marland Kitchen aspirin EC 81 MG tablet Take 81 mg by mouth daily.   Marland Kitchen esomeprazole (NEXIUM) 20 MG capsule TAKE 1 CAPSULE EVERY DAY AT 12 NOON   . fluticasone (FLONASE) 50 MCG/ACT nasal spray Place 2 sprays into both nostrils daily.   Marland Kitchen geriatric multivitamins-minerals (ELDERTONIC/GEVRABON) ELIX Take 15 mLs by mouth daily.   Marland Kitchen latanoprost (XALATAN) 0.005 % ophthalmic solution 1 drop at bedtime.   Marland Kitchen lisinopril-hydrochlorothiazide (ZESTORETIC) 20-12.5 MG tablet Take 1 tablet by mouth 2 (two) times daily.   . [DISCONTINUED] lovastatin (MEVACOR) 20 MG tablet Take 1 tablet (20 mg total) by mouth at bedtime. 09/13/2019: patient has not been taking this for some time. hurt her legs and muscles.    No facility-administered encounter medications on file as of 09/13/2019.     Surgical History: Past Surgical History:  Procedure Laterality Date  . ABDOMINAL HYSTERECTOMY    . COLONOSCOPY WITH  PROPOFOL N/A 10/18/2015   Procedure: COLONOSCOPY WITH PROPOFOL;  Surgeon: Manya Silvas, MD;  Location: [facility] ENDOSCOPY;  Service: Endoscopy;  Laterality: N/A;  . POLYPECTOMY      Medical History: Past Medical History:  Diagnosis Date  . Hyperlipidemia   . Hypertension   . Seasonal allergies     Family History: Family History  Problem Relation Age of Onset  . Cancer Brother   . Breast cancer Neg Hx     Social History   Socioeconomic History  . Marital status: Divorced    Spouse name: Not on file  . Number of children: Not on file  . Years of education: Not on file  . Highest education level: Not on file  Occupational History  . Not on file  Social Needs  . Financial resource strain: Not on file  . Food insecurity    Worry: Not on file    Inability: Not on file  . Transportation needs    Medical: Not on file    Non-medical: Not on file  Tobacco Use  . Smoking status: Former Research scientist (life sciences)  . Smokeless tobacco: Never Used  Substance and Sexual Activity  . Alcohol use: Yes    Alcohol/week: 0.0 standard drinks    Comment: ocassionally   . Drug use: No  . Sexual activity: Not on file  Lifestyle  . Physical activity    Days per week: Not on file    Minutes per  session: Not on file  . Stress: Not on file  Relationships  . Social Herbalist on phone: Not on file    Gets together: Not on file    Attends religious service: Not on file    Active member of club or organization: Not on file    Attends meetings of clubs or organizations: Not on file    Relationship status: Not on file  . Intimate partner violence    Fear of current or ex partner: Not on file    Emotionally abused: Not on file    Physically abused: Not on file    Forced sexual activity: Not on file  Other Topics Concern  . Not on file  Social History Narrative  . Not on file      Review of Systems  Constitutional: Negative for chills, fatigue and unexpected weight change.  HENT:  Negative for congestion, rhinorrhea, sneezing and sore throat.   Respiratory: Negative for cough, chest tightness and shortness of breath.   Cardiovascular: Negative for chest pain and palpitations.  Gastrointestinal: Negative for abdominal pain, constipation, diarrhea, nausea and vomiting.  Endocrine: Negative for cold intolerance, heat intolerance, polydipsia and polyuria.  Musculoskeletal: Negative for arthralgias, back pain, joint swelling and neck pain.  Skin: Negative for rash.  Allergic/Immunologic: Negative.   Neurological: Negative for tremors and numbness.  Hematological: Negative for adenopathy. Does not bruise/bleed easily.  Psychiatric/Behavioral: Negative for behavioral problems and sleep disturbance. The patient is not nervous/anxious.     Today's Vitals   09/13/19 1440  BP: 140/70  Pulse: (!) 102  Resp: 16  Temp: 97.7 F (36.5 C)  SpO2: 98%  Weight: 143 lb (64.9 kg)  Height: 5\' 5"  (1.651 m)   Body mass index is 23.8 kg/m.  Physical Exam Vitals signs and nursing note reviewed.  Constitutional:      General: She is not in acute distress.    Appearance: Normal appearance. She is well-developed. She is not diaphoretic.  HENT:     Head: Normocephalic and atraumatic.     Mouth/Throat:     Pharynx: No oropharyngeal exudate.  Eyes:     Pupils: Pupils are equal, round, and reactive to light.  Neck:     Musculoskeletal: Normal range of motion and neck supple.     Thyroid: No thyromegaly.     Vascular: No carotid bruit or JVD.     Trachea: No tracheal deviation.  Cardiovascular:     Rate and Rhythm: Normal rate and regular rhythm.     Heart sounds: Normal heart sounds. No murmur. No friction rub. No gallop.   Pulmonary:     Effort: Pulmonary effort is normal. No respiratory distress.     Breath sounds: Wheezing present. No rales.     Comments: Scant wheezing present throughout the lower lung fields.  Chest:     Chest wall: No tenderness.  Abdominal:      Palpations: Abdomen is soft.  Musculoskeletal: Normal range of motion.  Lymphadenopathy:     Cervical: No cervical adenopathy.  Skin:    General: Skin is warm and dry.  Neurological:     Mental Status: She is alert and oriented to person, place, and time.     Cranial Nerves: No cranial nerve deficit.  Psychiatric:        Behavior: Behavior normal.        Thought Content: Thought content normal.        Judgment: Judgment normal.  Assessment/Plan: 1. Essential (primary) hypertension Stable. Continue bp medication as prescribed   2. Gastroesophageal reflux disease without esophagitis May take nexium as prescribed   3. Mixed hyperlipidemia Patient controlling cholesterol through diet and exercise. Stopped lovastatin because it was causing joint and muscle pain.   General Counseling: sylinda contino understanding of the findings of todays visit and agrees with plan of treatment. I have discussed any further diagnostic evaluation that may be needed or ordered today. We also reviewed her medications today. she has been encouraged to call the office with any questions or concerns that should arise related to todays visit.   Hypertension Counseling:   The following hypertensive lifestyle modification were recommended and discussed:  1. Limiting alcohol intake to less than 1 oz/day of ethanol:(24 oz of beer or 8 oz of wine or 2 oz of 100-proof whiskey). 2. Take baby ASA 81 mg daily. 3. Importance of regular aerobic exercise and losing weight. 4. Reduce dietary saturated fat and cholesterol intake for overall cardiovascular health. 5. Maintaining adequate dietary potassium, calcium, and magnesium intake. 6. Regular monitoring of the blood pressure. 7. Reduce sodium intake to less than 100 mmol/day (less than 2.3 gm of sodium or less than 6 gm of sodium choride)   This patient was seen by [place] with Dr [name] as a part of collaborative care  agreement  Time spent: 25 Minutes      Dr [name] Internal medicine

## 2019-09-22 DIAGNOSIS — K219 Gastro-esophageal reflux disease without esophagitis: Secondary | ICD-10-CM | POA: Insufficient documentation

## 2019-10-12 DIAGNOSIS — Z23 Encounter for immunization: Secondary | ICD-10-CM | POA: Diagnosis not present

## 2019-12-28 ENCOUNTER — Telehealth: Payer: Self-pay

## 2019-12-28 NOTE — Telephone Encounter (Signed)
CONFIRMED AND SCREENED FOR 01-02-20 OV.

## 2020-01-02 ENCOUNTER — Ambulatory Visit (INDEPENDENT_AMBULATORY_CARE_PROVIDER_SITE_OTHER): Payer: Medicare Other | Admitting: Nurse Practitioner

## 2020-01-02 ENCOUNTER — Encounter: Payer: Self-pay | Admitting: Nurse Practitioner

## 2020-01-02 VITALS — Ht 65.0 in

## 2020-01-02 DIAGNOSIS — E782 Mixed hyperlipidemia: Secondary | ICD-10-CM | POA: Diagnosis not present

## 2020-01-02 DIAGNOSIS — J019 Acute sinusitis, unspecified: Secondary | ICD-10-CM | POA: Diagnosis not present

## 2020-01-02 DIAGNOSIS — J309 Allergic rhinitis, unspecified: Secondary | ICD-10-CM

## 2020-01-02 DIAGNOSIS — I10 Essential (primary) hypertension: Secondary | ICD-10-CM | POA: Diagnosis not present

## 2020-01-02 DIAGNOSIS — K219 Gastro-esophageal reflux disease without esophagitis: Secondary | ICD-10-CM

## 2020-01-02 MED ORDER — LISINOPRIL-HYDROCHLOROTHIAZIDE 20-12.5 MG PO TABS: 1.0000 | ORAL_TABLET | Freq: Two times a day (BID) | ORAL | 3 refills | Status: DC

## 2020-01-02 MED ORDER — FLUTICASONE PROPIONATE 50 MCG/ACT NA SUSP: 2.0000 | Freq: Every day | NASAL | 3 refills | Status: DC

## 2020-01-02 MED ORDER — AMLODIPINE BESYLATE 5 MG PO TABS: 5.0000 mg | ORAL_TABLET | Freq: Every day | ORAL | 5 refills | Status: DC

## 2020-01-02 MED ORDER — ESOMEPRAZOLE MAGNESIUM 20 MG PO CPDR: 20.0000 mg | DELAYED_RELEASE_CAPSULE | Freq: Every day | ORAL | 1 refills | Status: DC

## 2020-01-02 MED ORDER — AMOXICILLIN 875 MG PO TABS: 875.0000 mg | ORAL_TABLET | Freq: Two times a day (BID) | ORAL | 0 refills | Status: DC

## 2020-01-02 NOTE — Progress Notes (Signed)
[facility] [place], [place] [address]  Internal MEDICINE  Telephone Visit  Patient Name: Virginia Warner  [id number]  [id number]  Date of Service: 01/02/2020  I connected with the patient at 9:57am by telephone and verified the patients identity using two identifiers.   I discussed the limitations, risks, security and privacy concerns of performing an evaluation and management service by telephone and the availability of in person appointments. I also discussed with the patient that there may be a patient responsible charge related to the service.  The patient expressed understanding and agrees to proceed.    Chief Complaint  Patient presents with  . Hypertension  . Allergic Rhinitis     req abx, one side of nose is congested     The patient has been contacted via telephone for follow up visit due to concerns for spread of novel coronavirus. The patient presents for follow up visit. The patient states her blood pressure is doing pretty well. She states that the last time she took her blood pressure, it was 112/77. She states she worries quite a bit, especially since onset of COVID 19. She feels very isolated. Has not had anyone in her home for the past six months.  She states that her sinuses have been acting up. She has noted this has worsened over the past few weeks. Right nare is also more narrow than the left. She has had dry eye on the right eye. She states that she has a lot of post nasal drip. She has scratchy throat and headache.       Current Medication: Outpatient Encounter Medications as of 01/02/2020  Medication Sig  . aspirin EC 81 MG tablet Take 81 mg by mouth daily.  Marland Kitchen geriatric multivitamins-minerals (ELDERTONIC/GEVRABON) ELIX Take 15 mLs by mouth daily.  Marland Kitchen latanoprost (XALATAN) 0.005 % ophthalmic solution 1 drop at bedtime.  . [DISCONTINUED] amLODipine (NORVASC) 5 MG tablet Take 1 tablet (5 mg total) by mouth daily.  . [DISCONTINUED]  esomeprazole (NEXIUM) 20 MG capsule TAKE 1 CAPSULE EVERY DAY AT 12 NOON  . [DISCONTINUED] fluticasone (FLONASE) 50 MCG/ACT nasal spray Place 2 sprays into both nostrils daily.  . [DISCONTINUED] lisinopril-hydrochlorothiazide (ZESTORETIC) 20-12.5 MG tablet Take 1 tablet by mouth 2 (two) times daily.  Marland Kitchen amLODipine (NORVASC) 5 MG tablet Take 1 tablet (5 mg total) by mouth daily.  Marland Kitchen amoxicillin (AMOXIL) 875 MG tablet Take 1 tablet (875 mg total) by mouth 2 (two) times daily.  Marland Kitchen esomeprazole (NEXIUM) 20 MG capsule Take 1 capsule (20 mg total) by mouth daily.  . fluticasone (FLONASE) 50 MCG/ACT nasal spray Place 2 sprays into both nostrils daily.  Marland Kitchen lisinopril-hydrochlorothiazide (ZESTORETIC) 20-12.5 MG tablet Take 1 tablet by mouth 2 (two) times daily.   No facility-administered encounter medications on file as of 01/02/2020.    Surgical History: Past Surgical History:  Procedure Laterality Date  . ABDOMINAL HYSTERECTOMY    . COLONOSCOPY WITH PROPOFOL N/A 10/18/2015   Procedure: COLONOSCOPY WITH PROPOFOL;  Surgeon: Manya Silvas, MD;  Location: [facility] ENDOSCOPY;  Service: Endoscopy;  Laterality: N/A;  . POLYPECTOMY      Medical History: Past Medical History:  Diagnosis Date  . Hyperlipidemia   . Hypertension   . Seasonal allergies     Family History: Family History  Problem Relation Age of Onset  . Cancer Brother   . Breast cancer Neg Hx     Social History   Socioeconomic History  . Marital status: Divorced  Spouse name: Not on file  . Number of children: Not on file  . Years of education: Not on file  . Highest education level: Not on file  Occupational History  . Not on file  Tobacco Use  . Smoking status: Former Research scientist (life sciences)  . Smokeless tobacco: Never Used  Substance and Sexual Activity  . Alcohol use: Yes    Alcohol/week: 0.0 standard drinks    Comment: ocassionally   . Drug use: No  . Sexual activity: Not on file  Other Topics Concern  . Not on file  Social  History Narrative  . Not on file   Social Determinants of Health   Financial Resource Strain:   . Difficulty of Paying Living Expenses: Not on file  Food Insecurity:   . Worried About Charity fundraiser in the Last Year: Not on file  . Ran Out of Food in the Last Year: Not on file  Transportation Needs:   . Lack of Transportation (Medical): Not on file  . Lack of Transportation (Non-Medical): Not on file  Physical Activity:   . Days of Exercise per Week: Not on file  . Minutes of Exercise per Session: Not on file  Stress:   . Feeling of Stress : Not on file  Social Connections:   . Frequency of Communication with Friends and Family: Not on file  . Frequency of Social Gatherings with Friends and Family: Not on file  . Attends Religious Services: Not on file  . Active Member of Clubs or Organizations: Not on file  . Attends Archivist Meetings: Not on file  . Marital Status: Not on file  Intimate Partner Violence:   . Fear of Current or Ex-Partner: Not on file  . Emotionally Abused: Not on file  . Physically Abused: Not on file  . Sexually Abused: Not on file      Review of Systems  Constitutional: Positive for fatigue. Negative for chills, fever and unexpected weight change.  HENT: Positive for congestion, postnasal drip, rhinorrhea, sinus pressure and sore throat. Negative for sneezing.   Respiratory: Negative for cough, chest tightness, shortness of breath and wheezing.   Cardiovascular: Negative for chest pain and palpitations.  Gastrointestinal: Negative for abdominal pain, constipation, diarrhea, nausea and vomiting.  Musculoskeletal: Negative for arthralgias, back pain, joint swelling and neck pain.  Skin: Negative for rash.  Allergic/Immunologic: Positive for environmental allergies.  Neurological: Negative for dizziness, tremors, numbness and headaches.  Hematological: Negative for adenopathy. Does not bruise/bleed easily.  Psychiatric/Behavioral:  Negative for behavioral problems (Depression), dysphoric mood, sleep disturbance and suicidal ideas. The patient is nervous/anxious.     Today's Vitals   01/02/20 0941  Height: 5\' 5"  (1.651 m)   Body mass index is 23.8 kg/m.  Observation/Objective:   The patient is alert and oriented. She is pleasant and answers all questions appropriately. Breathing is non-labored. She is in no acute distress at this time.    Assessment/Plan:  1. Acute non-recurrent sinusitis, unspecified location Start amoxicillin 875mg  twice daily for 10 days. Rest and increase fluids. Recommend use of IOTC medication as needed and as indicated  - amoxicillin (AMOXIL) 875 MG tablet; Take 1 tablet (875 mg total) by mouth 2 (two) times daily.  Dispense: 20 tablet; Refill: 0  2. Chronic allergic rhinitis Continue OTC allegra. Use flonase and saline nasal sprays daily.  - fluticasone (FLONASE) 50 MCG/ACT nasal spray; Place 2 sprays into both nostrils daily.  Dispense: 16 g; Refill: 3  3. Essential (  primary) hypertension Stable. Continue bp medication as prescribed  - amLODipine (NORVASC) 5 MG tablet; Take 1 tablet (5 mg total) by mouth daily.  Dispense: 30 tablet; Refill: 5 - lisinopril-hydrochlorothiazide (ZESTORETIC) 20-12.5 MG tablet; Take 1 tablet by mouth 2 (two) times daily.  Dispense: 180 tablet; Refill: 3  4. Gastroesophageal reflux disease without esophagitis Take nexium 20mg  as needed and as indicated.  - esomeprazole (NEXIUM) 20 MG capsule; Take 1 capsule (20 mg total) by mouth daily.  Dispense: 90 capsule; Refill: 1  General Counseling: Jaysa verbalizes understanding of the findings of today's phone visit and agrees with plan of treatment. I have discussed any further diagnostic evaluation that may be needed or ordered today. We also reviewed her medications today. she has been encouraged to call the office with any questions or concerns that should arise related to todays visit.   Hypertension  Counseling:   The following hypertensive lifestyle modification were recommended and discussed:  1. Limiting alcohol intake to less than 1 oz/day of ethanol:(24 oz of beer or 8 oz of wine or 2 oz of 100-proof whiskey). 2. Take baby ASA 81 mg daily. 3. Importance of regular aerobic exercise and losing weight. 4. Reduce dietary saturated fat and cholesterol intake for overall cardiovascular health. 5. Maintaining adequate dietary potassium, calcium, and magnesium intake. 6. Regular monitoring of the blood pressure. 7. Reduce sodium intake to less than 100 mmol/day (less than 2.3 gm of sodium or less than 6 gm of sodium choride)   This patient was seen by [place] with Dr [name] as a part of collaborative care agreement  Meds ordered this encounter  Medications  . amoxicillin (AMOXIL) 875 MG tablet    Sig: Take 1 tablet (875 mg total) by mouth 2 (two) times daily.    Dispense:  20 tablet    Refill:  0    Order Specific Question:   Supervising Provider    Answer:   [name] [id number]  . amLODipine (NORVASC) 5 MG tablet    Sig: Take 1 tablet (5 mg total) by mouth daily.    Dispense:  30 tablet    Refill:  5    Order Specific Question:   Supervising Provider    Answer:   [name] [id number]  . esomeprazole (NEXIUM) 20 MG capsule    Sig: Take 1 capsule (20 mg total) by mouth daily.    Dispense:  90 capsule    Refill:  1    PT REQUESTS 90 DAY SUPPLIES PLEASE, [place] YOU    Order Specific Question:   Supervising Provider    Answer:   [name] [id number]  . fluticasone (FLONASE) 50 MCG/ACT nasal spray    Sig: Place 2 sprays into both nostrils daily.    Dispense:  16 g    Refill:  3    Order Specific Question:   Supervising Provider    Answer:   [name] [id number]  . lisinopril-hydrochlorothiazide (ZESTORETIC) 20-12.5 MG tablet    Sig: Take 1 tablet by mouth 2 (two) times daily.    Dispense:  180 tablet    Refill:  3    Order Specific Question:    Supervising Provider    Answer:   [name] [id number]    Time spent: 60 Minutes    Dr [name] Internal medicine

## 2020-02-06 ENCOUNTER — Ambulatory Visit: Payer: Medicare Other | Admitting: Nurse Practitioner

## 2020-02-13 ENCOUNTER — Ambulatory Visit: Payer: Medicare Other | Admitting: Nurse Practitioner

## 2020-02-14 ENCOUNTER — Ambulatory Visit: Payer: Medicare Other | Admitting: Nurse Practitioner

## 2020-02-27 DIAGNOSIS — Z23 Encounter for immunization: Secondary | ICD-10-CM | POA: Diagnosis not present

## 2020-03-01 ENCOUNTER — Ambulatory Visit (INDEPENDENT_AMBULATORY_CARE_PROVIDER_SITE_OTHER): Payer: Medicare Other | Admitting: Nurse Practitioner

## 2020-03-01 ENCOUNTER — Other Ambulatory Visit: Payer: Self-pay

## 2020-03-01 ENCOUNTER — Encounter: Payer: Self-pay | Admitting: Nurse Practitioner

## 2020-03-01 VITALS — BP 152/90 | HR 105 | Temp 97.8°F | Resp 16 | Ht 65.0 in | Wt 143.4 lb

## 2020-03-01 DIAGNOSIS — Z1231 Encounter for screening mammogram for malignant neoplasm of breast: Secondary | ICD-10-CM | POA: Diagnosis not present

## 2020-03-01 DIAGNOSIS — I10 Essential (primary) hypertension: Secondary | ICD-10-CM | POA: Diagnosis not present

## 2020-03-01 DIAGNOSIS — E782 Mixed hyperlipidemia: Secondary | ICD-10-CM

## 2020-03-01 DIAGNOSIS — R300 Dysuria: Secondary | ICD-10-CM | POA: Diagnosis not present

## 2020-03-01 DIAGNOSIS — Z0001 Encounter for general adult medical examination with abnormal findings: Secondary | ICD-10-CM | POA: Diagnosis not present

## 2020-03-01 DIAGNOSIS — Z13820 Encounter for screening for osteoporosis: Secondary | ICD-10-CM

## 2020-03-01 NOTE — Progress Notes (Signed)
[facility] [place], [place] [address]  Internal MEDICINE  Office Visit Note  Patient Name: Virginia Warner  [id number]  NM:[id number]  Date of Service: 03/04/2020   Pt is here for routine health maintenance examination  Chief Complaint  Patient presents with  . Medicare Wellness  . Hypertension    stopped taking bp meds for 2 weeks due to bp being low   . Gastroesophageal Reflux  . Sinusitis  . Quality Metric Gaps    dexa scan and pna vacc      The patient is here for health maintenance exam. Blood pressure is moderately elevated. She has cut back lisinopril/HCTZ 20/12.5mg  to once daily. She continues to take amlodipine 5mg  daily. At home, her blood pressure was running under [id number] with diastolic pressure around 60. She has not concerns or complaints other than blood pressure. She is due to have routine, fasting labs. She is also due to have bone density test and mammogram. She needs to have pneumonia vaccine, however, she just got first [place] 19 vaccine and is due to have second one later this month. She needs to wait to get this until after she finishes the covid vaccine series.     Current Medication: Outpatient Encounter Medications as of 03/01/2020  Medication Sig  . amLODipine (NORVASC) 5 MG tablet Take 1 tablet (5 mg total) by mouth daily.  Marland Kitchen amoxicillin (AMOXIL) 875 MG tablet Take 1 tablet (875 mg total) by mouth 2 (two) times daily.  Marland Kitchen aspirin EC 81 MG tablet Take 81 mg by mouth daily.  Marland Kitchen esomeprazole (NEXIUM) 20 MG capsule Take 1 capsule (20 mg total) by mouth daily.  . fluticasone (FLONASE) 50 MCG/ACT nasal spray Place 2 sprays into both nostrils daily.  Marland Kitchen geriatric multivitamins-minerals (ELDERTONIC/GEVRABON) ELIX Take 15 mLs by mouth daily.  Marland Kitchen latanoprost (XALATAN) 0.005 % ophthalmic solution 1 drop at bedtime.  Marland Kitchen lisinopril-hydrochlorothiazide (ZESTORETIC) 20-12.5 MG tablet Take 1 tablet by mouth 2 (two) times daily.   No  facility-administered encounter medications on file as of 03/01/2020.    Surgical History: Past Surgical History:  Procedure Laterality Date  . ABDOMINAL HYSTERECTOMY    . COLONOSCOPY WITH PROPOFOL N/A 10/18/2015   Procedure: COLONOSCOPY WITH PROPOFOL;  Surgeon: Manya Silvas, MD;  Location: [facility] ENDOSCOPY;  Service: Endoscopy;  Laterality: N/A;  . POLYPECTOMY      Medical History: Past Medical History:  Diagnosis Date  . Hyperlipidemia   . Hypertension   . Seasonal allergies     Family History: Family History  Problem Relation Age of Onset  . Cancer Brother   . Breast cancer Neg Hx       Review of Systems  Constitutional: Negative for activity change, chills, fatigue and unexpected weight change.  HENT: Negative for congestion, postnasal drip, rhinorrhea, sneezing and sore throat.   Respiratory: Negative for cough, chest tightness and shortness of breath.   Cardiovascular: Negative for chest pain and palpitations.       Blood pressure moderately elevated at beginning of visit.   Gastrointestinal: Negative for abdominal pain, constipation, diarrhea, nausea and vomiting.  Endocrine: Negative for cold intolerance, heat intolerance, polydipsia and polyuria.  Genitourinary: Negative for dysuria and frequency.  Musculoskeletal: Negative for arthralgias, back pain, joint swelling and neck pain.  Skin: Negative for rash.  Allergic/Immunologic: Negative for environmental allergies.  Neurological: Negative for dizziness, tremors, numbness and headaches.  Hematological: Negative for adenopathy. Does not bruise/bleed easily.  Psychiatric/Behavioral: Negative for behavioral problems (  Depression), dysphoric mood, sleep disturbance and suicidal ideas. The patient is nervous/anxious.      Today's Vitals   03/01/20 1442  BP: (!) 152/90  Pulse: (!) 105  Resp: 16  Temp: 97.8 F (36.6 C)  SpO2: 99%  Weight: 143 lb 6.4 oz (65 kg)  Height: 5\' 5"  (1.651 m)   Body mass index is  23.86 kg/m.  Physical Exam Vitals and nursing note reviewed.  Constitutional:      General: She is not in acute distress.    Appearance: Normal appearance. She is well-developed. She is not diaphoretic.  HENT:     Head: Normocephalic and atraumatic.     Nose: Nose normal.     Mouth/Throat:     Pharynx: No oropharyngeal exudate.  Eyes:     Conjunctiva/sclera: Conjunctivae normal.     Pupils: Pupils are equal, round, and reactive to light.  Neck:     Thyroid: No thyromegaly.     Vascular: No carotid bruit or JVD.     Trachea: No tracheal deviation.  Cardiovascular:     Rate and Rhythm: Normal rate and regular rhythm.     Pulses: Normal pulses.     Heart sounds: Normal heart sounds. No murmur. No friction rub. No gallop.   Pulmonary:     Effort: Pulmonary effort is normal. No respiratory distress.     Breath sounds: Normal breath sounds. No wheezing or rales.  Chest:     Chest wall: No tenderness.  Abdominal:     General: Bowel sounds are normal.     Palpations: Abdomen is soft.     Tenderness: There is no abdominal tenderness.  Musculoskeletal:        General: Normal range of motion.     Cervical back: Normal range of motion and neck supple.  Lymphadenopathy:     Cervical: No cervical adenopathy.  Skin:    General: Skin is warm and dry.  Neurological:     General: No focal deficit present.     Mental Status: She is alert and oriented to person, place, and time.     Cranial Nerves: No cranial nerve deficit.  Psychiatric:        Mood and Affect: Mood normal.        Behavior: Behavior normal.        Thought Content: Thought content normal.        Judgment: Judgment normal.    Depression screen [facility] 2/9 03/01/2020 01/02/2020 07/18/2019 12/27/2018 06/15/2018  Decreased Interest 0 0 0 0 0  Down, Depressed, Hopeless 0 0 0 0 0  PHQ - 2 Score 0 0 0 0 0    Functional Status Survey: Is the patient deaf or have difficulty hearing?: No Does the patient have difficulty seeing,  even when wearing glasses/contacts?: No Does the patient have difficulty concentrating, remembering, or making decisions?: No Does the patient have difficulty walking or climbing stairs?: No Does the patient have difficulty dressing or bathing?: No Does the patient have difficulty doing errands alone such as visiting a doctor's office or shopping?: No  MMSE - [place] Exam 03/01/2020 12/27/2018  Orientation to time 5 5  Orientation to Place 5 5  Registration 3 3  Attention/ Calculation 5 5  Recall 3 3  Language- name 2 objects 2 2  Language- repeat 1 1  Language- follow 3 step command 3 3  Language- read & follow direction 1 1  Write a sentence 1 1  Copy design 1  1  Total score 30 30    Fall Risk  03/01/2020 01/02/2020 07/18/2019 12/27/2018 06/15/2018  Falls in the past year? 0 0 0 0 No  Number falls in past yr: - - 0 - -  Injury with Fall? - - 0 - -      LABS: Recent Results (from the past 2160 hour(s))  UA/M w/rflx Culture, Routine     Status: None   Collection Time: 03/01/20 12:00 AM   Specimen: Urine   URINE  Result Value Ref Range   Specific Gravity, UA 1.006 1.005 - 1.030   pH, UA 7.5 5.0 - 7.5   Color, UA Yellow Yellow   Appearance Ur Clear Clear   Leukocytes,UA Negative Negative   Protein,UA Negative Negative/Trace   Glucose, UA Negative Negative   Ketones, UA Negative Negative   RBC, UA Negative Negative   Bilirubin, UA Negative Negative   Urobilinogen, Ur 0.2 0.2 - 1.0 mg/dL   Nitrite, UA Negative Negative   Microscopic Examination Comment     Comment: Microscopic follows if indicated.   Microscopic Examination See below:     Comment: Microscopic was indicated and was performed.   Urinalysis Reflex Comment     Comment: This specimen will not reflex to a Urine Culture.  Microscopic Examination     Status: None   Collection Time: 03/01/20 12:00 AM   URINE  Result Value Ref Range   WBC, UA 0-5 0 - 5 /hpf   RBC None seen 0 - 2 /hpf   Epithelial Cells  (non renal) 0-10 0 - 10 /hpf   Casts None seen None seen /lpf   Mucus, UA Present Not Estab.   Bacteria, UA None seen None seen/Few    Assessment/Plan:  1. Encounter for general adult medical examination with abnormal findings Annual health maintenance exam today.   2. Essential (primary) hypertension Generally stable. Continue bp medication as prescribed. Patient will monitor blood pressure closely at home.   3. Mixed hyperlipidemia Check fasting lipid panel and treat as indicated   4. Encounter for screening mammogram for malignant neoplasm of breast Screening mammogram to be ordered.   5. Screening for osteoporosis Bone density test ordered   6. Dysuria - UA/M w/rflx Culture, Routine  General Counseling: Sandy Salaam understanding of the findings of todays visit and agrees with plan of treatment. I have discussed any further diagnostic evaluation that may be needed or ordered today. We also reviewed her medications today. she has been encouraged to call the office with any questions or concerns that should arise related to todays visit.    Counseling:  Hypertension Counseling:   The following hypertensive lifestyle modification were recommended and discussed:  1. Limiting alcohol intake to less than 1 oz/day of ethanol:(24 oz of beer or 8 oz of wine or 2 oz of 100-proof whiskey). 2. Take baby ASA 81 mg daily. 3. Importance of regular aerobic exercise and losing weight. 4. Reduce dietary saturated fat and cholesterol intake for overall cardiovascular health. 5. Maintaining adequate dietary potassium, calcium, and magnesium intake. 6. Regular monitoring of the blood pressure. 7. Reduce sodium intake to less than 100 mmol/day (less than 2.3 gm of sodium or less than 6 gm of sodium choride)   This patient was seen by [place] with Dr [name] as a part of collaborative care agreement  Orders Placed This Encounter  Procedures  . Microscopic  Examination  . UA/M w/rflx Culture, Routine  Total time spent: 16 Minutes  Time spent includes review of chart, medications, test results, and follow up plan with the patient.     [name], MD  Internal Medicine

## 2020-03-02 LAB — UA/M W/RFLX CULTURE, ROUTINE
Bilirubin, UA: NEGATIVE
Glucose, UA: NEGATIVE
Ketones, UA: NEGATIVE
Leukocytes,UA: NEGATIVE
Nitrite, UA: NEGATIVE
Protein,UA: NEGATIVE
RBC, UA: NEGATIVE
Specific Gravity, UA: 1.006 (ref 1.005–1.030)
Urobilinogen, Ur: 0.2 mg/dL (ref 0.2–1.0)
pH, UA: 7.5 (ref 5.0–7.5)

## 2020-03-02 LAB — MICROSCOPIC EXAMINATION
Bacteria, UA: NONE SEEN
Casts: NONE SEEN /lpf
RBC, Urine: NONE SEEN /hpf (ref 0–2)

## 2020-03-04 DIAGNOSIS — R300 Dysuria: Secondary | ICD-10-CM | POA: Insufficient documentation

## 2020-03-04 DIAGNOSIS — Z13820 Encounter for screening for osteoporosis: Secondary | ICD-10-CM | POA: Insufficient documentation

## 2020-03-04 DIAGNOSIS — Z1231 Encounter for screening mammogram for malignant neoplasm of breast: Secondary | ICD-10-CM | POA: Insufficient documentation

## 2020-03-05 ENCOUNTER — Other Ambulatory Visit
Admission: RE | Admit: 2020-03-05 | Discharge: 2020-03-05 | Disposition: A | Discharge status: Home / Self Care | Payer: Medicare Other | Source: Ambulatory Visit | Attending: Nurse Practitioner | Admitting: Nurse Practitioner

## 2020-03-05 ENCOUNTER — Other Ambulatory Visit: Payer: Self-pay | Admitting: Nurse Practitioner

## 2020-03-05 DIAGNOSIS — E782 Mixed hyperlipidemia: Secondary | ICD-10-CM | POA: Diagnosis not present

## 2020-03-05 DIAGNOSIS — Z0001 Encounter for general adult medical examination with abnormal findings: Secondary | ICD-10-CM | POA: Diagnosis not present

## 2020-03-05 DIAGNOSIS — E0781 Sick-euthyroid syndrome: Secondary | ICD-10-CM | POA: Insufficient documentation

## 2020-03-05 DIAGNOSIS — I10 Essential (primary) hypertension: Secondary | ICD-10-CM | POA: Diagnosis not present

## 2020-03-05 DIAGNOSIS — E559 Vitamin D deficiency, unspecified: Secondary | ICD-10-CM | POA: Diagnosis not present

## 2020-03-05 DIAGNOSIS — M810 Age-related osteoporosis without current pathological fracture: Secondary | ICD-10-CM

## 2020-03-05 DIAGNOSIS — Z13820 Encounter for screening for osteoporosis: Secondary | ICD-10-CM

## 2020-03-05 DIAGNOSIS — Z1231 Encounter for screening mammogram for malignant neoplasm of breast: Secondary | ICD-10-CM

## 2020-03-05 LAB — CBC WITH DIFFERENTIAL/PLATELET
Abs Immature Granulocytes: 0.01 10*3/uL (ref 0.00–0.07)
Basophils Absolute: 0.1 10*3/uL (ref 0.0–0.1)
Basophils Relative: 1 %
Eosinophils Absolute: 0.2 10*3/uL (ref 0.0–0.5)
Eosinophils Relative: 3 %
HCT: 41 % (ref 36.0–46.0)
Hemoglobin: 13.3 g/dL (ref 12.0–15.0)
Immature Granulocytes: 0 %
Lymphocytes Relative: 44 %
Lymphs Abs: 2.4 10*3/uL (ref 0.7–4.0)
MCH: 28.3 pg (ref 26.0–34.0)
MCHC: 32.4 g/dL (ref 30.0–36.0)
MCV: 87.2 fL (ref 80.0–100.0)
Monocytes Absolute: 0.5 10*3/uL (ref 0.1–1.0)
Monocytes Relative: 10 %
Neutro Abs: 2.3 10*3/uL (ref 1.7–7.7)
Neutrophils Relative %: 42 %
Platelets: 426 10*3/uL — ABNORMAL HIGH (ref 150–400)
RBC: 4.7 MIL/uL (ref 3.87–5.11)
RDW: 13.8 % (ref 11.5–15.5)
WBC: 5.6 10*3/uL (ref 4.0–10.5)
nRBC: 0 % (ref 0.0–0.2)

## 2020-03-05 LAB — COMPREHENSIVE METABOLIC PANEL
ALT: 32 U/L (ref 0–44)
AST: 25 U/L (ref 15–41)
Albumin: 4 g/dL (ref 3.5–5.0)
Alkaline Phosphatase: 45 U/L (ref 38–126)
Anion gap: 7 (ref 5–15)
BUN: 8 mg/dL (ref 8–23)
CO2: 29 mmol/L (ref 22–32)
Calcium: 8.8 mg/dL — ABNORMAL LOW (ref 8.9–10.3)
Chloride: 102 mmol/L (ref 98–111)
Creatinine, Ser: 0.49 mg/dL (ref 0.44–1.00)
GFR calc Af Amer: >60 mL/min (ref >60)
GFR calc non Af Amer: >60 mL/min (ref >60)
Glucose, Bld: 101 mg/dL — ABNORMAL HIGH (ref 70–99)
Potassium: 3.5 mmol/L (ref 3.5–5.1)
Sodium: 138 mmol/L (ref 135–145)
Total Bilirubin: 0.7 mg/dL (ref 0.3–1.2)
Total Protein: 7.3 g/dL (ref 6.5–8.1)

## 2020-03-05 LAB — LIPID PANEL
Cholesterol: 216 mg/dL — ABNORMAL HIGH (ref 0–200)
HDL: 58 mg/dL (ref >40)
LDL Cholesterol: 128 mg/dL — ABNORMAL HIGH (ref 0–99)
Total CHOL/HDL Ratio: 3.7 RATIO
Triglycerides: 149 mg/dL (ref <150)
VLDL: 30 mg/dL (ref 0–40)

## 2020-03-05 LAB — TSH: TSH: 1.543 u[IU]/mL (ref 0.350–4.500)

## 2020-03-05 LAB — T4, FREE: Free T4: 0.68 ng/dL (ref 0.61–1.12)

## 2020-03-05 LAB — VITAMIN D 25 HYDROXY (VIT D DEFICIENCY, FRACTURES): Vit D, 25-Hydroxy: 32.7 ng/mL (ref 30–100)

## 2020-03-05 NOTE — Progress Notes (Signed)
Overall, these look good. Waiting for remainder of results.

## 2020-03-06 ENCOUNTER — Telehealth: Payer: Self-pay

## 2020-03-06 LAB — HCV INTERPRETATION

## 2020-03-06 LAB — HCV AB W REFLEX TO QUANT PCR: HCV Ab: <0.1 s/co ratio (ref 0.0–0.9)

## 2020-03-06 NOTE — Progress Notes (Signed)
Please let the patient know that, overall, her labs look good. Total and LDL cholesterol were just a little high. This can be better controlled through diet and exercise. I also recommend she take calcium supplement every day. Other labs were great. Thanks.

## 2020-03-06 NOTE — Telephone Encounter (Signed)
Pt was notified.  

## 2020-03-06 NOTE — Telephone Encounter (Signed)
-----   Message from Ronnell Freshwater, NP sent at 03/06/2020  8:22 AM EST ----- Please let the patient know that, overall, her labs look good. Total and LDL cholesterol were just a little high. This can be better controlled through diet and exercise. I also recommend she take calcium supplement every day. Other labs were great.  Thanks.

## 2020-03-07 ENCOUNTER — Other Ambulatory Visit: Payer: Self-pay | Admitting: Nurse Practitioner

## 2020-03-07 DIAGNOSIS — Z1231 Encounter for screening mammogram for malignant neoplasm of breast: Secondary | ICD-10-CM

## 2020-03-27 DIAGNOSIS — Z23 Encounter for immunization: Secondary | ICD-10-CM | POA: Diagnosis not present

## 2020-05-02 ENCOUNTER — Other Ambulatory Visit: Payer: Medicare Other

## 2020-05-05 DIAGNOSIS — R319 Hematuria, unspecified: Secondary | ICD-10-CM | POA: Diagnosis not present

## 2020-05-05 DIAGNOSIS — R350 Frequency of micturition: Secondary | ICD-10-CM | POA: Diagnosis not present

## 2020-05-07 ENCOUNTER — Ambulatory Visit (INDEPENDENT_AMBULATORY_CARE_PROVIDER_SITE_OTHER): Payer: Medicare Other | Admitting: Adult Health

## 2020-05-07 ENCOUNTER — Other Ambulatory Visit: Payer: Self-pay

## 2020-05-07 ENCOUNTER — Encounter: Payer: Self-pay | Admitting: Adult Health

## 2020-05-07 VITALS — BP 166/79 | HR 105 | Temp 98.8°F | Resp 16 | Ht 65.0 in | Wt 140.8 lb

## 2020-05-07 DIAGNOSIS — N3001 Acute cystitis with hematuria: Secondary | ICD-10-CM | POA: Diagnosis not present

## 2020-05-07 DIAGNOSIS — R300 Dysuria: Secondary | ICD-10-CM

## 2020-05-07 LAB — POCT URINALYSIS DIPSTICK
Bilirubin, UA: NEGATIVE
Glucose, UA: NEGATIVE
Ketones, UA: NEGATIVE
Nitrite, UA: NEGATIVE
Protein, UA: NEGATIVE
Spec Grav, UA: <=1.005 — AB (ref 1.010–1.025)
Urobilinogen, UA: 0.2 E.U./dL
pH, UA: 7 (ref 5.0–8.0)

## 2020-05-07 MED ORDER — SULFAMETHOXAZOLE-TRIMETHOPRIM 400-80 MG PO TABS: 1.0000 | ORAL_TABLET | Freq: Two times a day (BID) | ORAL | 0 refills | Status: DC

## 2020-05-07 NOTE — Progress Notes (Signed)
[facility] [place], [place] [address]  Internal MEDICINE  Office Visit Note  Patient Name: Virginia Warner  [id number]  NM:[id number]  Date of Service: 05/07/2020  Chief Complaint  Patient presents with  . Urinary Tract Infection    went to walk in saturday and had urinary frequency and blood in urine, pt was prescribed sulfameth/trimethoprim, pt states shes been drinking a lot beer since the pandemic, pt states physician stated that there are no "cells" in reading      HPI  Pt reports 2 days ago she noticed blood in her urine.  She went to the walk in clinic and was prescribed bactrim BID x 3 days.  Her urine dip today shows large RBC, and small leukocytes. She is currently on Bactrim.  She denies any visible blood at this time.  She denies any pain or dysuria.    Current Medication: Outpatient Encounter Medications as of 05/07/2020  Medication Sig  . amLODipine (NORVASC) 5 MG tablet Take 1 tablet (5 mg total) by mouth daily.  Marland Kitchen amoxicillin (AMOXIL) 875 MG tablet Take 1 tablet (875 mg total) by mouth 2 (two) times daily.  Marland Kitchen aspirin EC 81 MG tablet Take 81 mg by mouth daily.  Marland Kitchen esomeprazole (NEXIUM) 20 MG capsule Take 1 capsule (20 mg total) by mouth daily.  . fluticasone (FLONASE) 50 MCG/ACT nasal spray Place 2 sprays into both nostrils daily.  Marland Kitchen geriatric multivitamins-minerals (ELDERTONIC/GEVRABON) ELIX Take 15 mLs by mouth daily.  Marland Kitchen latanoprost (XALATAN) 0.005 % ophthalmic solution 1 drop at bedtime.  Marland Kitchen lisinopril-hydrochlorothiazide (ZESTORETIC) 20-12.5 MG tablet Take 1 tablet by mouth 2 (two) times daily.   No facility-administered encounter medications on file as of 05/07/2020.    Surgical History: Past Surgical History:  Procedure Laterality Date  . ABDOMINAL HYSTERECTOMY    . COLONOSCOPY WITH PROPOFOL N/A 10/18/2015   Procedure: COLONOSCOPY WITH PROPOFOL;  Surgeon: Manya Silvas, MD;  Location: [facility] ENDOSCOPY;  Service: Endoscopy;  Laterality:  N/A;  . POLYPECTOMY      Medical History: Past Medical History:  Diagnosis Date  . Hyperlipidemia   . Hypertension   . Seasonal allergies     Family History: Family History  Problem Relation Age of Onset  . Cancer Brother   . Breast cancer Neg Hx     Social History   Socioeconomic History  . Marital status: Divorced    Spouse name: Not on file  . Number of children: Not on file  . Years of education: Not on file  . Highest education level: Not on file  Occupational History  . Not on file  Tobacco Use  . Smoking status: Former Research scientist (life sciences)  . Smokeless tobacco: Never Used  Substance and Sexual Activity  . Alcohol use: Yes    Alcohol/week: 7.0 standard drinks    Types: 7 Cans of beer per week    Comment: everyday  . Drug use: No  . Sexual activity: Not on file  Other Topics Concern  . Not on file  Social History Narrative  . Not on file   Social Determinants of Health   Financial Resource Strain:   . Difficulty of Paying Living Expenses:   Food Insecurity:   . Worried About Charity fundraiser in the Last Year:   . Arboriculturist in the Last Year:   Transportation Needs:   . Film/video editor (Medical):   Marland Kitchen Lack of Transportation (Non-Medical):   Physical Activity:   .  Days of Exercise per Week:   . Minutes of Exercise per Session:   Stress:   . Feeling of Stress :   Social Connections:   . Frequency of Communication with Friends and Family:   . Frequency of Social Gatherings with Friends and Family:   . Attends Religious Services:   . Active Member of Clubs or Organizations:   . Attends Archivist Meetings:   Marland Kitchen Marital Status:   Intimate Partner Violence:   . Fear of Current or Ex-Partner:   . Emotionally Abused:   Marland Kitchen Physically Abused:   . Sexually Abused:       Review of Systems  Constitutional: Negative for chills, fatigue and unexpected weight change.  HENT: Negative for congestion, rhinorrhea, sneezing and sore throat.   Eyes:  Negative for photophobia, pain and redness.  Respiratory: Negative for cough, chest tightness and shortness of breath.   Cardiovascular: Negative for chest pain and palpitations.  Gastrointestinal: Negative for abdominal pain, constipation, diarrhea, nausea and vomiting.  Endocrine: Negative.   Genitourinary: Negative for dysuria and frequency.  Musculoskeletal: Negative for arthralgias, back pain, joint swelling and neck pain.  Skin: Negative for rash.  Allergic/Immunologic: Negative.   Neurological: Negative for tremors and numbness.  Hematological: Negative for adenopathy. Does not bruise/bleed easily.  Psychiatric/Behavioral: Negative for behavioral problems and sleep disturbance. The patient is not nervous/anxious.     Vital Signs: BP (!) 166/79   Pulse (!) 105   Temp 98.8 F (37.1 C)   Resp 16   Ht 5\' 5"  (1.651 m)   Wt 140 lb 12.8 oz (63.9 kg)   SpO2 98%   BMI 23.43 kg/m    Physical Exam Vitals and nursing note reviewed.  Constitutional:      General: She is not in acute distress.    Appearance: She is well-developed. She is not diaphoretic.  HENT:     Head: Normocephalic and atraumatic.     Mouth/Throat:     Pharynx: No oropharyngeal exudate.  Eyes:     Pupils: Pupils are equal, round, and reactive to light.  Neck:     Thyroid: No thyromegaly.     Vascular: No JVD.     Trachea: No tracheal deviation.  Cardiovascular:     Rate and Rhythm: Normal rate and regular rhythm.     Heart sounds: Normal heart sounds. No murmur. No friction rub. No gallop.   Pulmonary:     Effort: Pulmonary effort is normal. No respiratory distress.     Breath sounds: Normal breath sounds. No wheezing or rales.  Chest:     Chest wall: No tenderness.  Abdominal:     Palpations: Abdomen is soft.     Tenderness: There is no abdominal tenderness. There is no guarding.  Musculoskeletal:        General: Normal range of motion.     Cervical back: Normal range of motion and neck supple.   Lymphadenopathy:     Cervical: No cervical adenopathy.  Skin:    General: Skin is warm and dry.  Neurological:     Mental Status: She is alert and oriented to person, place, and time.     Cranial Nerves: No cranial nerve deficit.  Psychiatric:        Behavior: Behavior normal.        Thought Content: Thought content normal.        Judgment: Judgment normal.    Assessment/Plan: 1. Acute cystitis with hematuria Complete Bactrim as ordered. Recheck  urine for blood at next visit.   2. Dysuria - POCT Urinalysis Dipstick  General Counseling: Talah verbalizes understanding of the findings of todays visit and agrees with plan of treatment. I have discussed any further diagnostic evaluation that may be needed or ordered today. We also reviewed her medications today. she has been encouraged to call the office with any questions or concerns that should arise related to todays visit.    Orders Placed This Encounter  Procedures  . POCT Urinalysis Dipstick    No orders of the defined types were placed in this encounter.   Time spent: 25 Minutes   This patient was seen by Orson Gear AGNP-C in Collaboration with Dr [name] as a part of collaborative care agreement     Kendell Bane AGNP-C Internal medicine

## 2020-05-11 ENCOUNTER — Encounter: Payer: Self-pay | Admitting: Nurse Practitioner

## 2020-05-11 ENCOUNTER — Ambulatory Visit (INDEPENDENT_AMBULATORY_CARE_PROVIDER_SITE_OTHER): Payer: Medicare Other | Admitting: Nurse Practitioner

## 2020-05-11 VITALS — BP 155/75 | HR 99 | Temp 97.4°F | Resp 16 | Ht 65.0 in | Wt 140.0 lb

## 2020-05-11 DIAGNOSIS — R319 Hematuria, unspecified: Secondary | ICD-10-CM | POA: Insufficient documentation

## 2020-05-11 DIAGNOSIS — N390 Urinary tract infection, site not specified: Secondary | ICD-10-CM | POA: Diagnosis not present

## 2020-05-11 DIAGNOSIS — R300 Dysuria: Secondary | ICD-10-CM | POA: Diagnosis not present

## 2020-05-11 DIAGNOSIS — F5102 Adjustment insomnia: Secondary | ICD-10-CM | POA: Diagnosis not present

## 2020-05-11 LAB — POCT URINALYSIS DIPSTICK
Bilirubin, UA: NEGATIVE
Glucose, UA: NEGATIVE
Ketones, UA: NEGATIVE
Leukocytes, UA: NEGATIVE
Nitrite, UA: NEGATIVE
Protein, UA: NEGATIVE
Spec Grav, UA: 1.015 (ref 1.010–1.025)
Urobilinogen, UA: 0.2 E.U./dL
pH, UA: 7 (ref 5.0–8.0)

## 2020-05-11 MED ORDER — CEPHALEXIN 500 MG PO CAPS: 500.0000 mg | ORAL_CAPSULE | Freq: Three times a day (TID) | ORAL | 0 refills | Status: DC

## 2020-05-11 MED ORDER — TRAZODONE HCL 50 MG PO TABS: ORAL_TABLET | ORAL | 2 refills | Status: DC

## 2020-05-11 NOTE — Progress Notes (Signed)
[facility] [place], [place] [address]  Internal MEDICINE  Office Visit Note  Patient Name: Virginia Warner  [id number]  [id number]  Date of Service: 05/11/2020  Chief Complaint  Patient presents with  . Acute Visit    blood in urintation      The patient presents for acute visit. She continues to have blood in the urine as well as urinary frequency. She was seen in Urgent Care this past Saturday. Was told that there was no problem with urine but was put on bactrim. When blood did not resolve, she came in office and was seen. She had large amount blood and small WBC. She was to continue on Bactrim. She thought this was getting better. She finished the antibiotics. She was moderately active since then. States that she has lots of energy. She continues to have blood in the urine and a great deal of urinary frequency. She knows blood is coming from urinary tract. She did have hysterectomy at age 74. She denies abdominal pain. She does have moderate lower back pain which is intermittent. Denies nausea, vomiting, or fever. When she had CPE, her renal functions were normal and there was no blood or abnormalities in her urine. She does state that in recent weeks she has been drinking more beet than usual. States that she was drinking four to five cans of beer per day. Since these symptoms started, she has stopped drinking beer and is now drinking a lot of water and cranberry juice.  The patient does state that she is having a lot of nervous energy. She is having trouble sleeping. She is getting up frequently to use the bathroom, but she is also having trouble falling asleep and staying asleep.  Pt is here for a sick visit.     Current Medication:  Outpatient Encounter Medications as of 05/11/2020  Medication Sig  . amLODipine (NORVASC) 5 MG tablet Take 1 tablet (5 mg total) by mouth daily.  Marland Kitchen amoxicillin (AMOXIL) 875 MG tablet Take 1 tablet (875 mg total) by mouth 2  (two) times daily.  Marland Kitchen aspirin EC 81 MG tablet Take 81 mg by mouth daily.  Marland Kitchen esomeprazole (NEXIUM) 20 MG capsule Take 1 capsule (20 mg total) by mouth daily.  . fluticasone (FLONASE) 50 MCG/ACT nasal spray Place 2 sprays into both nostrils daily.  Marland Kitchen geriatric multivitamins-minerals (ELDERTONIC/GEVRABON) ELIX Take 15 mLs by mouth daily.  Marland Kitchen latanoprost (XALATAN) 0.005 % ophthalmic solution 1 drop at bedtime.  Marland Kitchen lisinopril-hydrochlorothiazide (ZESTORETIC) 20-12.5 MG tablet Take 1 tablet by mouth 2 (two) times daily.  Marland Kitchen sulfamethoxazole-trimethoprim (BACTRIM) 400-80 MG tablet Take 1 tablet by mouth 2 (two) times daily.  . cephALEXin (KEFLEX) 500 MG capsule Take 1 capsule (500 mg total) by mouth 3 (three) times daily.  . traZODone (DESYREL) 50 MG tablet Take 1/2 to 1 tablet po QHS prn anxiety/insomnia   No facility-administered encounter medications on file as of 05/11/2020.      Medical History: Past Medical History:  Diagnosis Date  . Hyperlipidemia   . Hypertension   . Seasonal allergies      Today's Vitals   05/11/20 1022  BP: (!) 155/75  Pulse: 99  Resp: 16  Temp: (!) 97.4 F (36.3 C)  SpO2: 98%  Weight: 140 lb (63.5 kg)  Height: 5\' 5"  (1.651 m)   Body mass index is 23.3 kg/m.  Review of Systems  Constitutional: Negative for chills, fatigue and unexpected weight change.  HENT: Negative for  congestion, postnasal drip, rhinorrhea, sneezing and sore throat.   Respiratory: Negative for cough, chest tightness and shortness of breath.   Cardiovascular: Negative for chest pain and palpitations.       Elevated blood pressure in the office  Gastrointestinal: Negative for abdominal pain, constipation, diarrhea, nausea and vomiting.  Endocrine: Negative for cold intolerance, heat intolerance, polydipsia and polyuria.  Genitourinary: Positive for flank pain, frequency and hematuria. Negative for dysuria.  Musculoskeletal: Negative for arthralgias, back pain, joint swelling and neck  pain.  Skin: Negative for rash.  Allergic/Immunologic: Negative for environmental allergies.  Neurological: Negative for dizziness, tremors, numbness and headaches.  Hematological: Negative for adenopathy. Does not bruise/bleed easily.  Psychiatric/Behavioral: Positive for sleep disturbance. Negative for behavioral problems (Depression) and suicidal ideas. The patient is nervous/anxious.     Physical Exam Vitals and nursing note reviewed.  Constitutional:      General: She is not in acute distress.    Appearance: Normal appearance. She is well-developed. She is not diaphoretic.  HENT:     Head: Normocephalic and atraumatic.     Mouth/Throat:     Pharynx: No oropharyngeal exudate.  Eyes:     Pupils: Pupils are equal, round, and reactive to light.  Neck:     Thyroid: No thyromegaly.     Vascular: No JVD.     Trachea: No tracheal deviation.  Cardiovascular:     Rate and Rhythm: Normal rate and regular rhythm.     Heart sounds: Normal heart sounds. No murmur. No friction rub. No gallop.   Pulmonary:     Effort: Pulmonary effort is normal. No respiratory distress.     Breath sounds: Normal breath sounds. No wheezing or rales.  Chest:     Chest wall: No tenderness.  Abdominal:     General: Bowel sounds are normal.     Palpations: Abdomen is soft.     Tenderness: There is no abdominal tenderness.  Genitourinary:    Comments: Urine sample positive for large blood.  Musculoskeletal:        General: Normal range of motion.     Cervical back: Normal range of motion and neck supple.  Lymphadenopathy:     Cervical: No cervical adenopathy.  Skin:    General: Skin is warm and dry.  Neurological:     Mental Status: She is alert and oriented to person, place, and time.     Cranial Nerves: No cranial nerve deficit.  Psychiatric:        Attention and Perception: Attention and perception normal.        Mood and Affect: Affect normal. Mood is anxious.        Speech: Speech normal.         Behavior: Behavior normal. Behavior is cooperative.        Thought Content: Thought content normal.        Cognition and Memory: Cognition and memory normal.        Judgment: Judgment normal.    Assessment/Plan: 1. Urinary tract infection with hematuria, site unspecified Urine sample still positive for large blood. Will start cephalexin 500mg  TID for next 10 days. Recommend she take with food. She should increase water intake. Recommend cranberry juice as well. Avoid alcohol. Send urine for culture and sensitivity and adjust antibiotics as indicated.  - cephALEXin (KEFLEX) 500 MG capsule; Take 1 capsule (500 mg total) by mouth 3 (three) times daily.  Dispense: 30 capsule; Refill: 0  2. Hematuria, unspecified type Persistent blood in  urine. Will get renal ultrasound for further evaluation.  - US Renal; Future  3. Adjustment insomnia Start trazodone 50mg , taking 1/2 to 1 tablet at bedtime as needed. Advised her to avoid alcohol when taking this medication.  - traZODone (DESYREL) 50 MG tablet; Take 1/2 to 1 tablet po QHS prn anxiety/insomnia  Dispense: 30 tablet; Refill: 2  4. Dysuria - POCT Urinalysis Dipstick - CULTURE, URINE COMPREHENSIVE  General Counseling: Virginia Warner verbalizes understanding of the findings of todays visit and agrees with plan of treatment. I have discussed any further diagnostic evaluation that may be needed or ordered today. We also reviewed her medications today. she has been encouraged to call the office with any questions or concerns that should arise related to todays visit.    Counseling:  This patient was seen by Leretha Pol FNP Collaboration with Dr [name] as a part of collaborative care agreement  Orders Placed This Encounter  Procedures  . CULTURE, URINE COMPREHENSIVE  . US Renal  . POCT Urinalysis Dipstick    Meds ordered this encounter  Medications  . cephALEXin (KEFLEX) 500 MG capsule    Sig: Take 1 capsule (500 mg total) by mouth 3  (three) times daily.    Dispense:  30 capsule    Refill:  0    Order Specific Question:   Supervising Provider    Answer:   [name] [id number]  . traZODone (DESYREL) 50 MG tablet    Sig: Take 1/2 to 1 tablet po QHS prn anxiety/insomnia    Dispense:  30 tablet    Refill:  2    Order Specific Question:   Supervising Provider    Answer:   [name] [id number]    Time spent: 45 Minutes

## 2020-05-14 LAB — CULTURE, URINE COMPREHENSIVE

## 2020-05-14 NOTE — Progress Notes (Signed)
Patient started on keflex TID at time of visit. Waiting on results of culture and sensitivity.

## 2020-05-15 NOTE — Progress Notes (Signed)
Patient scheduled for US renal 06/01/2020

## 2020-05-25 DIAGNOSIS — H2513 Age-related nuclear cataract, bilateral: Secondary | ICD-10-CM | POA: Diagnosis not present

## 2020-05-25 DIAGNOSIS — H40003 Preglaucoma, unspecified, bilateral: Secondary | ICD-10-CM | POA: Diagnosis not present

## 2020-05-29 ENCOUNTER — Ambulatory Visit
Admission: RE | Admit: 2020-05-29 | Discharge: 2020-05-29 | Disposition: A | Discharge status: Home / Self Care | Payer: Medicare Other | Source: Ambulatory Visit | Attending: Nurse Practitioner | Admitting: Nurse Practitioner

## 2020-05-29 DIAGNOSIS — Z1231 Encounter for screening mammogram for malignant neoplasm of breast: Secondary | ICD-10-CM

## 2020-05-29 DIAGNOSIS — M810 Age-related osteoporosis without current pathological fracture: Secondary | ICD-10-CM

## 2020-05-29 NOTE — Progress Notes (Signed)
Patient to have additional images

## 2020-05-29 NOTE — Progress Notes (Signed)
Osteoporosis. Will discuss with patient at visit 06/08/2020

## 2020-05-30 ENCOUNTER — Other Ambulatory Visit: Payer: Self-pay | Admitting: Nurse Practitioner

## 2020-05-30 DIAGNOSIS — R928 Other abnormal and inconclusive findings on diagnostic imaging of breast: Secondary | ICD-10-CM

## 2020-05-30 DIAGNOSIS — N6320 Unspecified lump in the left breast, unspecified quadrant: Secondary | ICD-10-CM

## 2020-06-01 ENCOUNTER — Other Ambulatory Visit: Payer: Self-pay

## 2020-06-01 ENCOUNTER — Ambulatory Visit (INDEPENDENT_AMBULATORY_CARE_PROVIDER_SITE_OTHER): Payer: Medicare Other

## 2020-06-01 DIAGNOSIS — R319 Hematuria, unspecified: Secondary | ICD-10-CM | POA: Diagnosis not present

## 2020-06-06 ENCOUNTER — Telehealth: Payer: Self-pay

## 2020-06-06 NOTE — Telephone Encounter (Signed)
Confirmed and screened for 06-08-20 ov.

## 2020-06-06 NOTE — Progress Notes (Signed)
Two small, non-obstructing stones and complicated cyst all in left kidney. Refer to urology at visit 06/08/2020.

## 2020-06-07 ENCOUNTER — Ambulatory Visit
Admission: RE | Admit: 2020-06-07 | Discharge: 2020-06-07 | Disposition: A | Discharge status: Home / Self Care | Payer: Medicare Other | Source: Ambulatory Visit | Attending: Nurse Practitioner | Admitting: Nurse Practitioner

## 2020-06-07 ENCOUNTER — Other Ambulatory Visit: Payer: Self-pay | Admitting: Nurse Practitioner

## 2020-06-07 DIAGNOSIS — R928 Other abnormal and inconclusive findings on diagnostic imaging of breast: Secondary | ICD-10-CM

## 2020-06-07 DIAGNOSIS — N6320 Unspecified lump in the left breast, unspecified quadrant: Secondary | ICD-10-CM | POA: Insufficient documentation

## 2020-06-07 DIAGNOSIS — N6489 Other specified disorders of breast: Secondary | ICD-10-CM | POA: Diagnosis not present

## 2020-06-08 ENCOUNTER — Ambulatory Visit (INDEPENDENT_AMBULATORY_CARE_PROVIDER_SITE_OTHER): Payer: Medicare Other | Admitting: Nurse Practitioner

## 2020-06-08 ENCOUNTER — Encounter: Payer: Self-pay | Admitting: Nurse Practitioner

## 2020-06-08 ENCOUNTER — Other Ambulatory Visit: Payer: Self-pay

## 2020-06-08 ENCOUNTER — Ambulatory Visit
Admission: RE | Admit: 2020-06-08 | Discharge: 2020-06-08 | Disposition: A | Discharge status: Home / Self Care | Payer: Medicare Other | Attending: Nurse Practitioner | Admitting: Nurse Practitioner

## 2020-06-08 ENCOUNTER — Ambulatory Visit
Admission: RE | Admit: 2020-06-08 | Discharge: 2020-06-08 | Disposition: A | Discharge status: Home / Self Care | Payer: Medicare Other | Source: Ambulatory Visit | Attending: Nurse Practitioner | Admitting: Nurse Practitioner

## 2020-06-08 VITALS — BP 122/70 | HR 97 | Resp 16 | Ht 65.0 in | Wt 141.0 lb

## 2020-06-08 DIAGNOSIS — M25552 Pain in left hip: Secondary | ICD-10-CM | POA: Insufficient documentation

## 2020-06-08 DIAGNOSIS — N200 Calculus of kidney: Secondary | ICD-10-CM | POA: Diagnosis not present

## 2020-06-08 DIAGNOSIS — M810 Age-related osteoporosis without current pathological fracture: Secondary | ICD-10-CM

## 2020-06-08 DIAGNOSIS — R928 Other abnormal and inconclusive findings on diagnostic imaging of breast: Secondary | ICD-10-CM

## 2020-06-08 DIAGNOSIS — R319 Hematuria, unspecified: Secondary | ICD-10-CM

## 2020-06-08 DIAGNOSIS — S79912A Unspecified injury of left hip, initial encounter: Secondary | ICD-10-CM | POA: Diagnosis not present

## 2020-06-08 MED ORDER — METHYLPREDNISOLONE 4 MG PO TBPK: ORAL_TABLET | ORAL | 0 refills | Status: DC

## 2020-06-08 MED ORDER — ALENDRONATE SODIUM 70 MG PO TABS: 70.0000 mg | ORAL_TABLET | ORAL | 3 refills | Status: DC

## 2020-06-08 NOTE — Progress Notes (Signed)
Highly suggestive of malignancy

## 2020-06-08 NOTE — Progress Notes (Signed)
Suspicious mass in left breast.

## 2020-06-08 NOTE — Progress Notes (Signed)
[facility] [place], [place] [address]  Internal MEDICINE  Office Visit Note  Patient Name: Virginia Warner  [id number]  [id number]  Date of Service: 06/17/2020  Chief Complaint  Patient presents with  . Follow-up    review ultrasound   . Hypertension    The patient is here for follow up of renal ultrasound. She has had persistent hematuria. Renal ultrasound shows two stones in left stone - one measuring 1.1cm in diameter the second is 0.8cm in diameter. She also has complex cyst in left kidney measuring 0.8cm in diameter. She has persistent hematuria.  Recent mammogram is showing mass in left breast which is highly suspicious for malignancy. She will have a biopsy for further evaluation Bone density wa also done showing generalized osteoporosis.  Did have a fall a few weeks ago. She did fall onto the left hip. This is causing her a great deal of pain. Hard to walk, especially when she has been sitting for a long period of time.       Current Medication: Outpatient Encounter Medications as of 06/08/2020  Medication Sig  . amLODipine (NORVASC) 5 MG tablet Take 1 tablet (5 mg total) by mouth daily.  Marland Kitchen aspirin EC 81 MG tablet Take 81 mg by mouth daily.  . cephALEXin (KEFLEX) 500 MG capsule Take 1 capsule (500 mg total) by mouth 3 (three) times daily.  Marland Kitchen esomeprazole (NEXIUM) 20 MG capsule Take 1 capsule (20 mg total) by mouth daily.  . fluticasone (FLONASE) 50 MCG/ACT nasal spray Place 2 sprays into both nostrils daily.  Marland Kitchen geriatric multivitamins-minerals (ELDERTONIC/GEVRABON) ELIX Take 15 mLs by mouth daily.  Marland Kitchen latanoprost (XALATAN) 0.005 % ophthalmic solution 1 drop at bedtime.  Marland Kitchen lisinopril-hydrochlorothiazide (ZESTORETIC) 20-12.5 MG tablet Take 1 tablet by mouth 2 (two) times daily.  . traZODone (DESYREL) 50 MG tablet Take 1/2 to 1 tablet po QHS prn anxiety/insomnia  . alendronate (FOSAMAX) 70 MG tablet Take 1 tablet (70 mg total) by mouth once a week.  Take with a full glass of water on an empty stomach.  . methylPREDNISolone (MEDROL) 4 MG TBPK tablet Take by mouth as directed for 6 days  . [DISCONTINUED] amoxicillin (AMOXIL) 875 MG tablet Take 1 tablet (875 mg total) by mouth 2 (two) times daily. (Patient not taking: Reported on 06/08/2020)  . [DISCONTINUED] sulfamethoxazole-trimethoprim (BACTRIM) 400-80 MG tablet Take 1 tablet by mouth 2 (two) times daily. (Patient not taking: Reported on 06/08/2020)   No facility-administered encounter medications on file as of 06/08/2020.    Surgical History: Past Surgical History:  Procedure Laterality Date  . ABDOMINAL HYSTERECTOMY    . BREAST BIOPSY Left 06/12/2020   [place] Bx, Ribbon Clip, path pending   . COLONOSCOPY WITH PROPOFOL N/A 10/18/2015   Procedure: COLONOSCOPY WITH PROPOFOL;  Surgeon: Manya Silvas, MD;  [facility]: [facility] END[facility]COPY;  Service: Endoscopy;  Laterality: N/A;  . POLYPECTOMY      Medical History: Past Medical History:  Diagnosis Date  . Hyperlipidemia   . Hypertension   . Seasonal allergies     Family History: Family History  Problem Relation Age of Onset  . Cancer Brother   . Breast cancer Neg Hx     Social History   Socioeconomic History  . Marital status: Divorced    Spouse name: Not on file  . Number of children: Not on file  . Years of education: Not on file  . Highest education level: Not on file  Occupational History  .  Not on file  Tobacco Use  . Smoking status: Former Research scientist (life sciences)  . Smokeless tobacco: Never Used  Vaping Use  . Vaping Use: Never used  Substance and Sexual Activity  . Alcohol use: Yes    Alcohol/week: 7.0 standard drinks    Types: 7 Cans of beer per week    Comment: everyday  . Drug use: No  . Sexual activity: Not on file  Other Topics Concern  . Not on file  Social History Narrative  . Not on file   Social Determinants of Health   Financial Resource Strain:   . Difficulty of Paying Living Expenses:   Food Insecurity:   .  Worried About Charity fundraiser in the Last Year:   . Arboriculturist in the Last Year:   Transportation Needs:   . Film/video editor (Medical):   Marland Kitchen Lack of Transportation (Non-Medical):   Physical Activity:   . Days of Exercise per Week:   . Minutes of Exercise per Session:   Stress:   . Feeling of Stress :   Social Connections:   . Frequency of Communication with Friends and Family:   . Frequency of Social Gatherings with Friends and Family:   . Attends Religious Services:   . Active Member of Clubs or Organizations:   . Attends Archivist Meetings:   Marland Kitchen Marital Status:   Intimate Partner Violence:   . Fear of Current or Ex-Partner:   . Emotionally Abused:   Marland Kitchen Physically Abused:   . Sexually Abused:       Review of Systems  Constitutional: Negative for activity change, chills, fatigue and unexpected weight change.  HENT: Negative for congestion, postnasal drip, rhinorrhea, sneezing and sore throat.   Respiratory: Negative for cough, chest tightness and shortness of breath.   Cardiovascular: Negative for chest pain and palpitations.  Gastrointestinal: Negative for abdominal pain, constipation, diarrhea, nausea and vomiting.  Endocrine: Negative for cold intolerance, heat intolerance, polydipsia and polyuria.  Genitourinary: Positive for flank pain, frequency and hematuria. Negative for dysuria.  Musculoskeletal: Positive for arthralgias. Negative for back pain, joint swelling and neck pain.       Left hip pain.   Skin: Negative for rash.  Allergic/Immunologic: Negative for environmental allergies.  Neurological: Negative for dizziness, tremors, numbness and headaches.  Hematological: Negative for adenopathy. Does not bruise/bleed easily.  Psychiatric/Behavioral: Positive for sleep disturbance. Negative for behavioral problems (Depression) and suicidal ideas. The patient is nervous/anxious.     Today's Vitals   06/08/20 0920  BP: 122/70  Pulse: 97  Resp:  16  SpO2: 96%  Weight: 141 lb (64 kg)  Height: 5\' 5"  (1.651 m)   Body mass index is 23.46 kg/m.  Physical Exam Vitals and nursing note reviewed.  Constitutional:      General: She is not in acute distress.    Appearance: Normal appearance. She is well-developed. She is not diaphoretic.  HENT:     Head: Normocephalic and atraumatic.     Mouth/Throat:     Pharynx: No oropharyngeal exudate.  Eyes:     Pupils: Pupils are equal, round, and reactive to light.  Neck:     Thyroid: No thyromegaly.     Vascular: No JVD.     Trachea: No tracheal deviation.  Cardiovascular:     Rate and Rhythm: Normal rate and regular rhythm.     Heart sounds: Normal heart sounds. No murmur heard.  No friction rub. No gallop.   Pulmonary:  Effort: Pulmonary effort is normal. No respiratory distress.     Breath sounds: Normal breath sounds. No wheezing or rales.  Chest:     Chest wall: No tenderness.  Abdominal:     Palpations: Abdomen is soft.  Musculoskeletal:        General: Normal range of motion.     Cervical back: Normal range of motion and neck supple.     Comments: Left hip tenderness. Worse with sitting or standing for long periods of time. There are no palpable abnormalities or deformities noted.   Lymphadenopathy:     Cervical: No cervical adenopathy.  Skin:    General: Skin is warm and dry.  Neurological:     Mental Status: She is alert and oriented to person, place, and time.     Cranial Nerves: No cranial nerve deficit.  Psychiatric:        Attention and Perception: Attention and perception normal.        Mood and Affect: Affect normal. Mood is anxious.        Speech: Speech normal.        Behavior: Behavior normal. Behavior is cooperative.        Thought Content: Thought content normal.        Cognition and Memory: Cognition and memory normal.        Judgment: Judgment normal.    Assessment/Plan: 1. Hematuria, unspecified type Persistent hematuria. Reviewed ultrasound of  kidneys and bladder with the patient. Shows two renal calculi and complicated renal cyst in left kidney. Refer to urology for further evaluation and treatment.  - Ambulatory referral to Urology  2. Renal calculi Reviewed ultrasound of kidneys and bladder with the patient. Shows two renal calculi and complicated renal cyst in left kidney. Refer to urology for further evaluation and treatment.  - Ambulatory referral to Urology  3. Age-related osteoporosis without current pathological fracture Reviewed bone density showing osteoporosis. Start fosamax 70mg  weekly. Retest in two years.  - alendronate (FOSAMAX) 70 MG tablet; Take 1 tablet (70 mg total) by mouth once a week. Take with a full glass of water on an empty stomach.  Dispense: 4 tablet; Refill: 3  4. Left hip pain Start medrol dose pack. Take as directed for 6 days. Will get x-ray of left hip for further evaluation and treatment.  - methylPREDNISolone (MEDROL) 4 MG TBPK tablet; Take by mouth as directed for 6 days  Dispense: 21 tablet; Refill: 0 - DG Hip Unilat W OR W/O Pelvis 2-3 Views Left; Future  5. Abnormal mammogram of left breast Reviewed mammogram and left breast ultrasound showing suspicious mass of left breast at 7 o'clock position. Strongly encouraged her to go for biopsy appointment for further evaluation.   General Counseling: vinetta brach understanding of the findings of todays visit and agrees with plan of treatment. I have discussed any further diagnostic evaluation that may be needed or ordered today. We also reviewed her medications today. she has been encouraged to call the office with any questions or concerns that should arise related to todays visit.  This patient was seen by Leretha Pol FNP Collaboration with Dr [name] as a part of collaborative care agreement  Orders Placed This Encounter  Procedures  . DG Hip Unilat W OR W/O Pelvis 2-3 Views Left  . Ambulatory referral to Urology    Meds ordered  this encounter  Medications  . alendronate (FOSAMAX) 70 MG tablet    Sig: Take 1 tablet (70 mg total)  by mouth once a week. Take with a full glass of water on an empty stomach.    Dispense:  4 tablet    Refill:  3    Order Specific Question:   Supervising Provider    Answer:   [name] [[id number]]  . methylPREDNISolone (MEDROL) 4 MG TBPK tablet    Sig: Take by mouth as directed for 6 days    Dispense:  21 tablet    Refill:  0    Order Specific Question:   Supervising Provider    Answer:   [name] [[id number]]    Total time spent: 30 Minutes Time spent includes review of chart, medications, test results, and follow up plan with the patient.      Dr [name] Internal medicine

## 2020-06-12 ENCOUNTER — Ambulatory Visit
Admission: RE | Admit: 2020-06-12 | Discharge: 2020-06-12 | Disposition: A | Discharge status: Home / Self Care | Payer: Medicare Other | Source: Ambulatory Visit | Attending: Nurse Practitioner | Admitting: Nurse Practitioner

## 2020-06-12 DIAGNOSIS — N6320 Unspecified lump in the left breast, unspecified quadrant: Secondary | ICD-10-CM | POA: Diagnosis present

## 2020-06-12 DIAGNOSIS — R928 Other abnormal and inconclusive findings on diagnostic imaging of breast: Secondary | ICD-10-CM

## 2020-06-12 DIAGNOSIS — N6324 Unspecified lump in the left breast, lower inner quadrant: Secondary | ICD-10-CM | POA: Diagnosis not present

## 2020-06-12 HISTORY — PX: BREAST BIOPSY: SHX20

## 2020-06-14 ENCOUNTER — Ambulatory Visit: Payer: Medicare Other | Admitting: Nurse Practitioner

## 2020-06-14 ENCOUNTER — Encounter: Payer: Self-pay | Admitting: *Deleted

## 2020-06-14 DIAGNOSIS — C50912 Malignant neoplasm of unspecified site of left female breast: Secondary | ICD-10-CM

## 2020-06-14 NOTE — Progress Notes (Signed)
Called patient to establish navigation services.  Patient states she is depressed and lives alone.  She does not have any children.  Has her cousins for support.  I have scheduled her to see Dr. Dahlia Byes on Monday at 1:45 and Dr. Janese Banks on Tuesday.  Offered support.  Will give educational literature at that time.

## 2020-06-15 ENCOUNTER — Ambulatory Visit: Payer: Medicare Other | Admitting: Nurse Practitioner

## 2020-06-17 DIAGNOSIS — M25552 Pain in left hip: Secondary | ICD-10-CM | POA: Insufficient documentation

## 2020-06-17 DIAGNOSIS — R928 Other abnormal and inconclusive findings on diagnostic imaging of breast: Secondary | ICD-10-CM | POA: Insufficient documentation

## 2020-06-17 DIAGNOSIS — N200 Calculus of kidney: Secondary | ICD-10-CM | POA: Insufficient documentation

## 2020-06-17 DIAGNOSIS — M810 Age-related osteoporosis without current pathological fracture: Secondary | ICD-10-CM | POA: Insufficient documentation

## 2020-06-18 ENCOUNTER — Other Ambulatory Visit: Payer: Self-pay

## 2020-06-18 ENCOUNTER — Encounter: Payer: Self-pay | Admitting: Surgery

## 2020-06-18 ENCOUNTER — Ambulatory Visit (INDEPENDENT_AMBULATORY_CARE_PROVIDER_SITE_OTHER): Payer: Medicare Other | Admitting: Surgery

## 2020-06-18 VITALS — BP 119/79 | HR 111 | Temp 93.2°F | Ht 65.0 in | Wt 136.4 lb

## 2020-06-18 DIAGNOSIS — C50912 Malignant neoplasm of unspecified site of left female breast: Secondary | ICD-10-CM

## 2020-06-18 NOTE — Patient Instructions (Addendum)
Dr.Pabon discussed with patient the two different treatment options at today's visit. Dr.Pabon is awaiting patient's results from the Mammogram.  Below I have attached the diagnosis and the two different options as far as the treatment. Patient will follow up with Dr.Pabon on Monday 06/25/20 at 2:00pm.  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 It is the earliest form of breast cancer. The abnormal cells are located only in the tubes that carry milk to the nipple (milk ducts) and have not spread to other areas. What are the causes? The exact cause of ductal carcinoma in situ is not known. What increases the risk? The following factors increase the risk of developing ductal carcinoma in situ:  Being older than 74 years of age.  Being female.  Having a family history of breast cancer.  Current or past hormone use, such as: ? Using birth control. ? Taking hormone therapy after menopause.  Starting menopause after age 40.  A personal history of: ? Breast cancer. ? Dense breasts. ? Radiation treatments to the breasts or chest area. ? Having the BRCA1 and BRCA2 genes.  Drinking more than 1 alcoholic beverage a day.  Starting your menstrual periods before age 15.  Having never been pregnant or having your first child after age 85.  Having never breastfed.  Having an inactive (sedentary) lifestyle.  Exposure to the drug DES, which was given to pregnant women from the 1940s to the 1970s. What are the signs or symptoms? Ductal carcinoma in situ does not cause any symptoms. How is this diagnosed? Ductal carcinoma in situ is usually discovered during a routine X-ray of the breasts to check for abnormal changes (mammogram). To diagnose the condition, your health care provider may do an ultrasound and remove a tissue sample from your breast so it can be examined under a microscope (breast biopsy). Your health care provider may  also remove one or more lymph nodes from under your arm to check if the abnormal cells have spread to your lymph nodes (sentinel lymph node biopsy). Lymph nodes are part of the body's disease-fighting (immune) system. They are located throughout the body. The lymph nodes under the arms are usually the first place where abnormal cells spread. How is this treated? Ductal carcinoma in situ treatment may include:  A lumpectomy. This is surgery to remove the area of abnormal cells, along with a ring of normal tissue. This may also be called breast-conserving surgery.  Simple mastectomy. This is surgery to remove breast tissue, the nipple, and the circle of colored tissue around the nipple (areola). Sometimes, one or more lymph nodes from under the arm are also removed and tested for cancer cells.  Preventive mastectomy. This is the removal of both breasts. This is usually done only if you have a very high risk of developing breast cancer.  Radiation. This is the use of high-energy rays to kill cancer cells.  Medicines (hormone therapy) to keep the abnormal cells from spreading. Follow these instructions at home:  Take over-the-counter and prescription medicines only as told by your health care provider.  Eat a healthy diet. A healthy diet includes lots of fruits and vegetables, low-fat dairy products, lean meats, and fiber. ? Make sure half your plate is filled with fruits or vegetables. ? Choose high-fiber foods such as whole-grain breads and cereals.  Limit alcohol intake to no more than 1 drink a day for women (no drinks if you are  pregnant) and 2 drinks a day for men. One drink equals 12 oz of beer, 5 oz of wine, or 1 oz of hard liquor.  Do not use any products that contain nicotine or tobacco, such as cigarettes and e-cigarettes. If you need help quitting, ask your health care provider.  Keep all follow-up visits as told by your health care provider. This is important. Where to find more  information  American Cancer Society: www.cancer.[place]: www.cancer.gov Contact a health care provider if:  You have a fever.  You notice a new lump in either breast or under your arm.  You have any symptoms or changes that concern you. Get help right away if:  You have chest pain or trouble breathing. Summary  Ductal carcinoma in situ is the presence of abnormal cells in the breast. It is the earliest form of breast cancer.  The exact cause of ductal carcinoma in situ is not known. The risk increases with age and with current or past hormone use.  To diagnose the condition, your health care provider will remove a tissue sample from your breast so it can be examined under a microscope (breast biopsy). This information is not intended to replace advice given to you by your health care provider. Make sure you discuss any questions you have with your health care provider. Document Revised: 11/27/2017 Document Reviewed: 09/22/2017 Elsevier Patient Education  2020 [employer].  Total or Modified Radical Mastectomy A total mastectomy and a modified radical mastectomy are surgeries that are done as part of treatment for breast cancer. You will have one of those types of surgery. Both types involve removing a breast.  In a total mastectomy (simple mastectomy), all breast tissue including the nipple will be removed.  In a modified radical mastectomy, lymph nodes under the arm will be removed along with the breast and nipple. Some of the lining over the muscle tissues under the breast may also be removed. These procedures may also be used to help prevent breast cancer. A preventive (prophylactic) mastectomy may be done if you are at an increased risk of breast cancer due to harmful changes (mutations) in certain genes (BRCA genes). In that case, the procedure involves removing both of your breasts. This can reduce your risk of developing breast cancer in the  future. For a transgender person, a total mastectomy may be done as part of a surgical transition from female to female. Let your health care provider know about:  Any allergies you have.  All medicines you are taking, including vitamins, herbs, eye drops, creams, and over-the-counter medicines.  Any problems you or family members have had with anesthetic medicines.  Any blood disorders you have.  Any surgeries you have had.  Any medical conditions you have.  Whether you are pregnant or may be pregnant. What are the risks? Generally, this is a safe procedure. However, problems may occur, including:  Pain.  Infection.  Bleeding.  Allergic reactions to medicines.  Scar tissue.  Chest numbness on the side of the surgery.  Fluid buildup under the skin flaps where your breast was removed (seroma).  Sensation of throbbing or tingling.  Stress or sadness from losing your breast. If you have the lymph nodes under your arm removed, you may have arm swelling, weakness, or numbness on the same side of your body as your surgery. What happens before the procedure? Staying hydrated Follow instructions from your health care provider about hydration, which may include:  Up to  2 hours before the procedure - you may continue to drink clear liquids, such as water, clear fruit juice, black coffee, and plain tea. Eating and drinking restrictions Follow instructions from your health care provider about eating and drinking, which may include:  8 hours before the procedure - stop eating heavy meals or foods such as meat, fried foods, or fatty foods.  6 hours before the procedure - stop eating light meals or foods, such as toast or cereal.  6 hours before the procedure - stop drinking milk or drinks that contain milk.  2 hours before the procedure - stop drinking clear liquids. Medicines  Ask your health care provider about: ? Changing or stopping your regular medicines. This is  especially important if you are taking diabetes medicines or blood thinners. ? Taking medicines such as aspirin and ibuprofen. These medicines can thin your blood. Do not take these medicines unless your health care provider tells you to take them. ? Taking over-the-counter medicines, vitamins, herbs, and supplements.  Your health care team may give you antibiotic medicine to help prevent infection. General instructions  You may be checked for extra fluid around your lymph nodes (lymphedema).  Plan to have someone take you home from the hospital or clinic.  Plan to have a responsible adult care for you for at least 24 hours after you leave the hospital or clinic. This is important.  Ask your health care provider how your surgical site will be marked or identified.  You may be asked to shower with a germ-killing soap. What happens during the procedure?   To lower your risk of infection: ? Your health care team will wash or sanitize their hands. ? Your skin will be washed with soap.  An IV will be inserted into one of your veins.  You will be given a medicine to make you fall asleep (general anesthetic).  A wide incision will be made around your nipple. The skin and nipple inside the incision will be removed along with all breast tissue.  If you are having a modified radical mastectomy: ? The lining over your chest muscles will be removed. ? The incision may be extended to reach the lymph nodes under your arm, or a second incision may be made. ? Lymph nodes will be removed.  Breast tissue and lymph nodes that are removed will be sent to the lab for testing.  You may have a drainage tube inserted into your incision to collect fluid that builds up after surgery. This tube will be connected to a suction bulb on the outside of your body to remove the fluid.  Your incision or incisions will be closed with stitches (sutures).  A bandage (dressing) will be placed over your breast area.  If lymph nodes were removed, a dressing will also be placed under your arm. The procedure may vary among health care providers and hospitals. What happens after the procedure?  Your blood pressure, heart rate, breathing rate, and blood oxygen level will be monitored until the medicines you were given have worn off.  You will be given pain medicine as needed.  You will be encouraged to get up and walk as soon as you can.  Your IV can be removed when you are able to eat and drink.  You may have a drainage tube in place for 2-3 days to prevent a collection of blood (hematoma) from developing in the breast area. You will be given instructions about caring for the drain before you  go home.  A pressure bandage may be applied for 1-2 days to prevent bleeding or swelling. Ask your health care provider how to care for your pressure bandage at home. Summary  In a total mastectomy (simple mastectomy), all breast tissue including the nipple will be removed. In a modified radical mastectomy, the lymph nodes under the arm will be removed along with the breast and nipple.  Before the procedure, follow instructions from your health care provider about eating and drinking, and ask about changing or stopping your regular medicines.  You will be given a medicine to make you fall asleep (general anesthetic) during the procedure. This information is not intended to replace advice given to you by your health care provider. Make sure you discuss any questions you have with your health care provider. Document Revised: 02/18/2019 Document Reviewed: 09/18/2017 Elsevier Patient Education  [place]  A lumpectomy, sometimes called a partial mastectomy, is surgery to remove a cancerous tumor or mass (the lump) from a breast. It is a form of breast-conserving or breast-preservation surgery. This means that the cancerous tissue is removed but the breast remains intact. During a lumpectomy, the  portion of the breast that contains the tumor is removed. Some normal tissue around the lump may be taken out to make sure that all of the tumor has been removed. Lymph nodes under your arm may also be removed and tested to find out if the cancer has spread. Lymph nodes are part of the body's disease-fighting system (immune system) and are usually the first place where breast cancer spreads. Tell a health care provider about:  Any allergies you have.  All medicines you are taking, including vitamins, herbs, eye drops, creams, and over-the-counter medicines.  Any problems you or family members have had with anesthetic medicines.  Any blood disorders you have.  Any surgeries you have had.  Any medical conditions you have.  Whether you are pregnant or may be pregnant. What are the risks? Generally, this is a safe procedure. However, problems may occur, including:  Bleeding.  Infection.  Allergic reaction to medicines.  Pain, swelling, weakness, or numbness in the arm on the side of your surgery.  Temporary swelling.  Change in the shape of the breast, particularly if a large portion is removed.  Scar tissue that forms at the surgical site and feels hard to the touch.  Blood clots. What happens before the procedure? Staying hydrated Follow instructions from your health care provider about hydration, which may include:  Up to 2 hours before the procedure - you may continue to drink clear liquids, such as water, clear fruit juice, black coffee, and plain tea.  Eating and drinking restrictions Follow instructions from your health care provider about eating and drinking, which may include:  8 hours before the procedure - stop eating heavy meals or foods, such as meat, fried foods, or fatty foods.  6 hours before the procedure - stop eating light meals or foods, such as toast or cereal.  6 hours before the procedure - stop drinking milk or drinks that contain milk.  2 hours  before the procedure - stop drinking clear liquids. Medicines Ask your health care provider about:  Changing or stopping your regular medicines. This is especially important if you are taking diabetes medicines or blood thinners.  Taking medicines such as aspirin and ibuprofen. These medicines can thin your blood. Do not take these medicines unless your health care provider tells you to take  them.  Taking over-the-counter medicines, vitamins, herbs, and supplements. General instructions  Prior to surgery, your health care provider may do a procedure to locate and mark the tumor area in your breast (localization). This will help guide your surgeon to where the incision will be made. This may be done with: ? Imaging, such as a mammogram, ultrasound, or MRI. ? Insertion of a small wire, clip, or seed, or an implant that will reflect a radar signal.  You may have screening tests or exams to get baseline measurements of your arm. These can be compared to measurements done after surgery to monitor for swelling (lymphedema) that can develop after having lymph nodes removed.  Ask your health care provider: ? How your surgery site will be marked. ? What steps will be taken to help prevent infection. These may include:  Washing skin with a germ-killing soap.  Taking antibiotic medicine.  Plan to have someone take you home from the hospital or clinic.  Plan to have a responsible adult care for you for at least 24 hours after you leave the hospital or clinic. This is important. What happens during the procedure?   An IV will be inserted into one of your veins.  You will be given one or more of the following: ? A medicine to help you relax (sedative). ? A medicine to numb the area (local anesthetic). ? A medicine to make you fall asleep (general anesthetic).  Your health care provider will use a kind of electric scalpel that uses heat to reduce bleeding (electrocautery knife). A curved  incision that follows the natural curve of your breast will be made. This type of incision will allow for minimal scarring and better healing.  The tumor will be removed along with some of the tissue around it. This will be sent to the lab for testing. Your health care provider may also remove lymph nodes at this time if needed.  If the tumor is close to the muscles over your chest, some muscle tissue may also be removed.  A small drain tube may be inserted into your breast area or armpit to collect fluid that may build up after surgery. This tube will be connected to a suction bulb on the outside of your body to remove the fluid.  The incision will be closed with stitches (sutures).  A bandage (dressing) may be placed over the incision. The procedure may vary among health care providers and hospitals. What happens after the procedure?  Your blood pressure, heart rate, breathing rate, and blood oxygen level will be monitored until you leave the hospital or clinic.  You will be given medicine for pain as needed.  Your IV will be removed when you are able to eat and drink by mouth.  You will be encouraged to get up and walk as soon as you can. This is important to improve blood flow and breathing. Ask for help if you feel weak or unsteady.  You may have: ? A drain tube in place for 2-3 days to prevent a collection of blood (hematoma) from developing in the breast. You will be given instructions about caring for the drain before you go home. ? A pressure bandage applied for 1-2 days to prevent bleeding or swelling. Your pressure bandage may look like a thick piece of fabric or an elastic wrap. Ask your health care provider how to care for your bandage at home.  You may be given a tight sleeve to wear over your arm  on the side of your surgery. You should wear this sleeve as told by your health care provider.  Do not drive for 24 hours if you were given a sedative during your  procedure. Summary  A lumpectomy, sometimes called a partial mastectomy, is surgery to remove a cancerous tumor or mass (the lump) from a breast.  During a lumpectomy, the portion of the breast that contains the tumor is removed. Lymph nodes under your arm may also be removed and tested to find out if the cancer has spread.  Plan to have someone take you home from the hospital or clinic.  You may have a drain tube in place for 2-3 days to prevent a collection of blood (hematoma) from developing in the breast. You will be given instructions about caring for the drain before you go home. This information is not intended to replace advice given to you by your health care provider. Make sure you discuss any questions you have with your health care provider. Document Revised: 06/20/2019 Document Reviewed: 06/20/2019 Elsevier Patient Education  [place].

## 2020-06-18 NOTE — Progress Notes (Signed)
Patient ID: Virginia Warner, female   DOB: 1946-11-03, 74 y.o.   MRN: [id number]  HPI Virginia Warner is a 74 y.o. 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  She was found to have an abnormality on the left side on mammogram.  Please note that I have personally reviewed the images.  She underwent core needle biopsy of the left breast under ultrasound guidance.  Pathology reviewed and discussed with the patient in detail.  Still pending markers.  No evidence of axillary involvement.  Patient measured 7 mm.  CBC and CMP were completely normal. She has a history of abdominal hysterectomy.  Family did have breast cyst on the right side several years ago. She is able to perform more than 4 METS of activity without any shortness of breath or chest pain. He did have birth control therapy.  She had a partial hysterectomy when she was 29.  He did have causing an aunt with a history of breast cancer.  Her menarche was at age 14.  Reports that this was findings on her mammogram and she did not have any symptoms  HPI  Past Medical History:  Diagnosis Date  . Hyperlipidemia   . Hypertension   . Seasonal allergies     Past Surgical History:  Procedure Laterality Date  . ABDOMINAL HYSTERECTOMY    . BREAST BIOPSY Left 06/12/2020   [place] Bx, Ribbon Clip, path pending   . COLONOSCOPY WITH PROPOFOL N/A 10/18/2015   Procedure: COLONOSCOPY WITH PROPOFOL;  Surgeon: Manya Silvas, MD;  Location: [facility] ENDOSCOPY;  Service: Endoscopy;  Laterality: N/A;  . POLYPECTOMY      Family History  Problem Relation Age of Onset  . Cancer Brother   . Breast cancer Neg Hx     Social History Social History   Tobacco Use  . Smoking status: Former Research scientist (life sciences)  . Smokeless tobacco: Never Used  Vaping Use  . Vaping Use: Never used  Substance Use Topics  . Alcohol use: Yes    Alcohol/week: 7.0 standard drinks    Types: 7 Cans of beer per week    Comment: everyday  . Drug use: No     Allergies  Allergen Reactions  . Aspirin Other (See Comments)    Cannot tolerate unless enteric coated  . Atorvastatin Other (See Comments)    Current Outpatient Medications  Medication Sig Dispense Refill  . amLODipine (NORVASC) 5 MG tablet Take 1 tablet (5 mg total) by mouth daily. 30 tablet 5  . fluticasone (FLONASE) 50 MCG/ACT nasal spray Place 2 sprays into both nostrils daily. 16 g 3  . geriatric multivitamins-minerals (ELDERTONIC/GEVRABON) ELIX Take 15 mLs by mouth daily.    Marland Kitchen latanoprost (XALATAN) 0.005 % ophthalmic solution 1 drop at bedtime.    Marland Kitchen lisinopril-hydrochlorothiazide (ZESTORETIC) 20-12.5 MG tablet Take 1 tablet by mouth 2 (two) times daily. 180 tablet 3  . alendronate (FOSAMAX) 70 MG tablet Take 1 tablet (70 mg total) by mouth once a week. Take with a full glass of water on an empty stomach. (Patient not taking: Reported on 06/18/2020) 4 tablet 3  . aspirin EC 81 MG tablet Take 81 mg by mouth daily. (Patient not taking: Reported on 06/18/2020)    . cephALEXin (KEFLEX) 500 MG capsule Take 1 capsule (500 mg total) by mouth 3 (three) times daily. (Patient not taking: Reported on 06/18/2020) 30 capsule 0  . esomeprazole (NEXIUM) 20 MG capsule Take 1 capsule (20 mg total) by  mouth daily. (Patient not taking: Reported on 06/18/2020) 90 capsule 1  . methylPREDNISolone (MEDROL) 4 MG TBPK tablet Take by mouth as directed for 6 days (Patient not taking: Reported on 06/18/2020) 21 tablet 0  . traZODone (DESYREL) 50 MG tablet Take 1/2 to 1 tablet po QHS prn anxiety/insomnia (Patient not taking: Reported on 06/18/2020) 30 tablet 2   No current facility-administered medications for this visit.     Review of Systems Full ROS  was asked and was negative except for the information on the HPI  Physical Exam Blood pressure 119/79, pulse (!) 111, temperature (!) 93.2 F (34 C), temperature source Temporal, height 5\' 5"  (1.651 m), weight 136 lb 6.4 oz (61.9 kg), SpO2 95  %. CONSTITUTIONAL: NAD. EYES: Pupils are equal, round, , Sclera are non-icteric. EARS, NOSE, MOUTH AND THROAT: He is wearing a mask hearing is intact to voice. LYMPH NODES:  Lymph nodes in the neck are normal. RESPIRATORY:  Lungs are clear. There is normal respiratory effort, with equal breath sounds bilaterally, and without pathologic use of accessory muscles. CARDIOVASCULAR: Heart is regular without murmurs, gallops, or rubs. BREAST: There is no evidence of palpable masses.  There is evidence of ecchymosis of the left breast and there is tenderness to palpation in the left breast due to her recent biopsy.  There is no evidence of lymphadenopathy on either breast.  There is no evidence of infection related to the biopsy site GI: The abdomen is soft, nontender, and nondistended. There are no palpable masses. There is no hepatosplenomegaly. There are normal bowel sounds in all quadrants. GU: Rectal deferred.   MUSCULOSKELETAL: Normal muscle strength and tone. No cyanosis or edema.   SKIN: Turgor is good and there are no pathologic skin lesions or ulcers. NEUROLOGIC: Motor and sensation is grossly normal. Cranial nerves are grossly intact. PSYCH:  Oriented to person, place and time. Affect is normal.  Data Reviewed  I have personally reviewed the patient's imaging, laboratory findings and medical records.    Assessment/Plan 74 year old female recently diagnosed with invasive mammary carcinoma of the left breast located on the inner lower quadrant.  Pending ER/PR Her receptors. I had an extensive discussion with the patient regarding the diagnosis of breast cancer.  I did explain to her that this is a multimodal therapy that will require coordination of multiple doctors. She Does have an appointment with oncology tomorrow.  Markers are pending at this time.  I did discuss with her regarding surgical options to include simple mastectomy versus lumpectomy and radiation therapy.  She is interested in  breast conservation surgery however wishes to think about it and also wants to talk to Dr. Janese Banks tomorrow. SHe did have a lot of questions and was very nervous.  I provided positive reinforcement and some cognitive behavioral therapy. I discussed with her that this is definitely a treatable disease .  SHe is very appreciative We will see her next week and at that time we will determine next steps.  I think that since it is early upcoming surgery probably be in her best interest depending on final pathology  Time spent with the patient was 60 minutes, with more than 50% of the time spent in face-to-face education, counseling and care coordination.     Caroleen Hamman, MD FACS General Surgeon 06/18/2020, 2:36 PM

## 2020-06-19 ENCOUNTER — Other Ambulatory Visit: Payer: Self-pay

## 2020-06-19 ENCOUNTER — Encounter: Payer: Self-pay | Admitting: *Deleted

## 2020-06-19 ENCOUNTER — Inpatient Hospital Stay: Payer: Medicare Other | Attending: Oncology | Admitting: Oncology

## 2020-06-19 ENCOUNTER — Encounter: Payer: Self-pay | Admitting: Oncology

## 2020-06-19 ENCOUNTER — Inpatient Hospital Stay: Payer: Medicare Other

## 2020-06-19 VITALS — BP 133/77 | HR 106 | Temp 97.6°F | Resp 16 | Wt 136.0 lb

## 2020-06-19 DIAGNOSIS — I10 Essential (primary) hypertension: Secondary | ICD-10-CM | POA: Diagnosis not present

## 2020-06-19 DIAGNOSIS — M47816 Spondylosis without myelopathy or radiculopathy, lumbar region: Secondary | ICD-10-CM | POA: Diagnosis not present

## 2020-06-19 DIAGNOSIS — E782 Mixed hyperlipidemia: Secondary | ICD-10-CM | POA: Diagnosis not present

## 2020-06-19 DIAGNOSIS — Z7982 Long term (current) use of aspirin: Secondary | ICD-10-CM | POA: Insufficient documentation

## 2020-06-19 DIAGNOSIS — C50512 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 breast: Secondary | ICD-10-CM | POA: Insufficient documentation

## 2020-06-19 DIAGNOSIS — Z7952 Long term (current) use of systemic steroids: Secondary | ICD-10-CM | POA: Diagnosis not present

## 2020-06-19 DIAGNOSIS — Z79899 Other long term (current) drug therapy: Secondary | ICD-10-CM | POA: Insufficient documentation

## 2020-06-19 DIAGNOSIS — Z87891 Personal history of nicotine dependence: Secondary | ICD-10-CM | POA: Diagnosis not present

## 2020-06-19 DIAGNOSIS — K219 Gastro-esophageal reflux disease without esophagitis: Secondary | ICD-10-CM | POA: Insufficient documentation

## 2020-06-19 DIAGNOSIS — Z7189 Other specified counseling: Secondary | ICD-10-CM | POA: Insufficient documentation

## 2020-06-19 LAB — SURGICAL PATHOLOGY

## 2020-06-19 NOTE — Progress Notes (Signed)
Met patient today during her initial medical oncology consult with Dr. Janese Banks.  Patient's ER/PR Her2 results are not available today. Patient is to proceed with surgery and follow up with Dr. Janese Banks in 3 weeks.  Gave patient breast cancer educational literature, "My Breast Cancer Treatment Handbook" by Josephine Igo, RN.  She is to call with any questions or needs.

## 2020-06-19 NOTE — Progress Notes (Signed)
Hematology/Oncology Consult note [facility] Telephone:([phone] [phone]  Patient Care Team: Ronnell Freshwater, NP as PCP - General (Family Medicine) Rico Junker, RN as Registered Nurse   Name of the patient: Virginia Warner  [id number]  Jan 29, 1946    Reason for referral-history of breast cancer   Referring physician-Heather Junius Creamer NP  Date of visit: 06/19/20   History of presenting illness- Patient is a 74 year old female with a past medical history significant for osteoporosis hematuria who recently underwent a screening bilateral mammogram on 05/29/2020 which showed a possible mass in the left breast.  This was followed by diagnostic mammogram and ultrasound which showed a 7 x 7 x 4 mm mass 3 cm from the nipple at the 7 o'clock position.  Normal-appearing left axillary lymph nodes.  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  ER/PR and HER-2 is currently pending.  Overall grade 2.  Patient had a partial hysterectomy at the age of 37.  Menarche at the age of 50.  Maternal aunt with breast cancer.  ECOG PS- 1  Pain scale- 0   Review of systems- Review of Systems  Constitutional: Negative for chills, fever, malaise/fatigue and weight loss.  HENT: Negative for congestion, ear discharge and nosebleeds.   Eyes: Negative for blurred vision.  Respiratory: Negative for cough, hemoptysis, sputum production, shortness of breath and wheezing.   Cardiovascular: Negative for chest pain, palpitations, orthopnea and claudication.  Gastrointestinal: Negative for abdominal pain, blood in stool, constipation, diarrhea, heartburn, melena, nausea and vomiting.  Genitourinary: Negative for dysuria, flank pain, frequency, hematuria and urgency.  Musculoskeletal: Negative for back pain, joint pain and myalgias.  Skin: Negative for rash.  Neurological: Negative for dizziness, tingling, focal  weakness, seizures, weakness and headaches.  Endo/Heme/Allergies: Does not bruise/bleed easily.  Psychiatric/Behavioral: Negative for depression and suicidal ideas. The patient does not have insomnia.     Allergies  Allergen Reactions  . Aspirin Other (See Comments)    Cannot tolerate unless enteric coated  . Atorvastatin Other (See Comments)    Patient Active Problem List   Diagnosis Date Noted  . Renal calculi 06/17/2020  . Age-related osteoporosis without current pathological fracture 06/17/2020  . Left hip pain 06/17/2020  . Abnormal mammogram of left breast 06/17/2020  . Urinary tract infection with hematuria 05/11/2020  . Hematuria 05/11/2020  . Adjustment insomnia 05/11/2020  . Encounter for screening mammogram for malignant neoplasm of breast 03/04/2020  . Screening for osteoporosis 03/04/2020  . Dysuria 03/04/2020  . Gastroesophageal reflux disease without esophagitis 09/22/2019  . Encounter for general adult medical examination with abnormal findings 12/27/2018  . Chronic allergic rhinitis 12/27/2018  . Lymphadenopathy 12/24/2018  . Acute non-recurrent sinusitis 07/05/2018  . Essential (primary) hypertension 01/18/2018  . Mixed hyperlipidemia 01/18/2018     Past Medical History:  Diagnosis Date  . Hyperlipidemia   . Hypertension   . Seasonal allergies      Past Surgical History:  Procedure Laterality Date  . ABDOMINAL HYSTERECTOMY    . BREAST BIOPSY Left 06/12/2020   [place] Bx, Ribbon Clip, path pending   . COLONOSCOPY WITH PROPOFOL N/A 10/18/2015   Procedure: COLONOSCOPY WITH PROPOFOL;  Surgeon: Manya Silvas, MD;  Location: [facility] ENDOSCOPY;  Service: Endoscopy;  Laterality: N/A;  . POLYPECTOMY      Social History   Socioeconomic History  . Marital status: Divorced    Spouse name: Not on file  . Number of children:  Not on file  . Years of education: Not on file  . Highest education level: Not on file  Occupational History  . Not on file  Tobacco  Use  . Smoking status: Former Research scientist (life sciences)  . Smokeless tobacco: Never Used  Vaping Use  . Vaping Use: Never used  Substance and Sexual Activity  . Alcohol use: Yes    Alcohol/week: 7.0 standard drinks    Types: 7 Cans of beer per week    Comment: everyday  . Drug use: No  . Sexual activity: Not on file  Other Topics Concern  . Not on file  Social History Narrative  . Not on file   Social Determinants of Health   Financial Resource Strain:   . Difficulty of Paying Living Expenses:   Food Insecurity:   . Worried About Charity fundraiser in the Last Year:   . Arboriculturist in the Last Year:   Transportation Needs:   . Film/video editor (Medical):   Marland Kitchen Lack of Transportation (Non-Medical):   Physical Activity:   . Days of Exercise per Week:   . Minutes of Exercise per Session:   Stress:   . Feeling of Stress :   Social Connections:   . Frequency of Communication with Friends and Family:   . Frequency of Social Gatherings with Friends and Family:   . Attends Religious Services:   . Active Member of Clubs or Organizations:   . Attends Archivist Meetings:   Marland Kitchen Marital Status:   Intimate Partner Violence:   . Fear of Current or Ex-Partner:   . Emotionally Abused:   Marland Kitchen Physically Abused:   . Sexually Abused:      Family History  Problem Relation Age of Onset  . Cancer Brother   . Breast cancer Neg Hx      Current Outpatient Medications:  .  alendronate (FOSAMAX) 70 MG tablet, Take 1 tablet (70 mg total) by mouth once a week. Take with a full glass of water on an empty stomach. (Patient not taking: Reported on 06/18/2020), Disp: 4 tablet, Rfl: 3 .  amLODipine (NORVASC) 5 MG tablet, Take 1 tablet (5 mg total) by mouth daily., Disp: 30 tablet, Rfl: 5 .  aspirin EC 81 MG tablet, Take 81 mg by mouth daily. (Patient not taking: Reported on 06/18/2020), Disp: , Rfl:  .  cephALEXin (KEFLEX) 500 MG capsule, Take 1 capsule (500 mg total) by mouth 3 (three) times daily.  (Patient not taking: Reported on 06/18/2020), Disp: 30 capsule, Rfl: 0 .  esomeprazole (NEXIUM) 20 MG capsule, Take 1 capsule (20 mg total) by mouth daily. (Patient not taking: Reported on 06/18/2020), Disp: 90 capsule, Rfl: 1 .  fluticasone (FLONASE) 50 MCG/ACT nasal spray, Place 2 sprays into both nostrils daily., Disp: 16 g, Rfl: 3 .  geriatric multivitamins-minerals (ELDERTONIC/GEVRABON) ELIX, Take 15 mLs by mouth daily., Disp: , Rfl:  .  latanoprost (XALATAN) 0.005 % ophthalmic solution, 1 drop at bedtime., Disp: , Rfl:  .  lisinopril-hydrochlorothiazide (ZESTORETIC) 20-12.5 MG tablet, Take 1 tablet by mouth 2 (two) times daily., Disp: 180 tablet, Rfl: 3 .  methylPREDNISolone (MEDROL) 4 MG TBPK tablet, Take by mouth as directed for 6 days (Patient not taking: Reported on 06/18/2020), Disp: 21 tablet, Rfl: 0 .  traZODone (DESYREL) 50 MG tablet, Take 1/2 to 1 tablet po QHS prn anxiety/insomnia (Patient not taking: Reported on 06/18/2020), Disp: 30 tablet, Rfl: 2   Physical exam:  Vitals:  06/19/20 1531  BP: 133/77  Pulse: (!) 106  Resp: 16  Temp: 97.6 F (36.4 C)  TempSrc: Tympanic  Weight: 136 lb (61.7 kg)   Physical Exam Constitutional:      General: She is not in acute distress. Cardiovascular:     Rate and Rhythm: Normal rate and regular rhythm.     Heart sounds: Normal heart sounds.  Pulmonary:     Effort: Pulmonary effort is normal.     Breath sounds: Normal breath sounds.  Abdominal:     General: Bowel sounds are normal.     Palpations: Abdomen is soft.  Skin:    General: Skin is warm and dry.  Neurological:     Mental Status: She is alert and oriented to person, place, and time.   Breast exam performed in sitting and lying down position.  No palpable bilateral axillary adenopathy.  No palpable masses in the right breast.  No palpable masses in the left breast.  Induration and ecchymosis noted at the site of biopsy.    CMP Latest Ref Rng & Units 03/05/2020  Glucose 70  - 99 mg/dL 101(H)  BUN 8 - 23 mg/dL 8  Creatinine 0.44 - 1.00 mg/dL 0.49  Sodium 135 - 145 mmol/L 138  Potassium 3.5 - 5.1 mmol/L 3.5  Chloride 98 - 111 mmol/L 102  CO2 22 - 32 mmol/L 29  Calcium 8.9 - 10.3 mg/dL 8.8(L)  Total Protein 6.5 - 8.1 g/dL 7.3  Total Bilirubin 0.3 - 1.2 mg/dL 0.7  Alkaline Phos 38 - 126 U/L 45  AST 15 - 41 U/L 25  ALT 0 - 44 U/L 32   CBC Latest Ref Rng & Units 03/05/2020  WBC 4.0 - 10.5 K/uL 5.6  Hemoglobin 12.0 - 15.0 g/dL 13.3  Hematocrit 36 - 46 % 41.0  Platelets 150 - 400 K/uL 426(H)    No images are attached to the encounter.  DG Bone Density  Result Date: 05/29/2020 EXAM: DUAL X-RAY ABSORPTIOMETRY (DXA) FOR BONE MINERAL DENSITY IMPRESSION: Your patient Terrance Lanahan completed a BMD test on 05/29/2020 using the [employer] (software version: 14.10) manufactured by [employer]. The following summarizes the results of our evaluation. Technologist: vlm PATIENT BIOGRAPHICAL: Name: Sami, Roes Patient ID: [id number] Birth Date: 02/02/1946 Height:     62.5 in. Gender: Female Exam Date: 05/29/2020 Weight:     136.0 lbs. Indications: Height Loss, Hysterectomy, Oophorectomy Unilateral Fractures: Treatments: calcium w/ vit D DENSITOMETRY RESULTS: Site         Region      Measured Date Measured Age WHO Classification Young Adult T-score BMD         %Change vs. Previous Significant Change (*) DualFemur Total Right 05/29/[age].3 Osteoporosis -2.6 0.679 g/cm2 - - Left Forearm Radius 33% 05/29/2020 74.3 Osteopenia -1.9 0.708 g/cm2 - - ASSESSMENT: The BMD measured at Femur Total Right is 0.679 g/cm2 with a T-score of -2.6. This patient is considered OSTEOPOROTIC according to [facility]) criteria. Lumbar spine was not utilized due to scoliosis. The scan quality was limited due to patient mobility. World Pharmacologist [facility]) criteria for post-menopausal, Caucasian Women: Normal:                   T-score at or above -1 SD  Osteopenia/low bone mass: T-score between -1 and -2.5 SD Osteoporosis:             T-score at or below -2.5 SD RECOMMENDATIONS: 1. All  patients should optimize calcium and vitamin D intake. 2. Consider FDA-approved medical therapies in postmenopausal women and men aged 47 years and older, based on the following: a. A hip or vertebral(clinical or morphometric) fracture b. T-score < -2.5 at the femoral neck or spine after appropriate evaluation to exclude secondary causes c. Low bone mass (T-score between -1.0 and -2.5 at the femoral neck or spine) and a 10-year probability of a hip fracture > 3% or a 10-year probability of a major osteoporosis-related fracture > 20% based on the [place]-adapted WHO algorithm 3. Clinician judgment and/or patient preferences may indicate treatment for people with 10-year fracture probabilities above or below these levels FOLLOW-UP: People with diagnosed cases of osteoporosis or at high risk for fracture should have regular bone mineral density tests. For patients eligible for Medicare, routine testing is allowed once every 2 years. The testing frequency can be increased to one year for patients who have rapidly progressing disease, those who are receiving or discontinuing medical therapy to restore bone mass, or have additional risk factors. I have reviewed this report, and agree with the above findings. Mark A. Thornton Papas, M.D. [facility] Electronically Signed   By: Lavonia Dana M.D.   On: 05/29/2020 12:04   US BREAST LTD UNI LEFT INC AXILLA  Result Date: 06/07/2020 CLINICAL DATA:  Screening recall for a possible left breast mass. EXAM: DIGITAL DIAGNOSTIC LEFT MAMMOGRAM WITH CAD AND TOMO ULTRASOUND LEFT BREAST COMPARISON:  Previous exam(s). ACR Breast Density Category b: There are scattered areas of fibroglandular density. FINDINGS: Spot compression tomosynthesis images reveal a persistent irregular mass/focal asymmetry in the lower inner posterior left breast. Mammographic  images were processed with CAD. Ultrasound targeted to the left breast at 7 o'clock, 3 cm from the nipple demonstrates a subtle shadowing mass measuring 7 x 4 x 7 mm. There is a prominent blood vessel running adjacent to the mass. Ultrasound of the left axilla demonstrates multiple normal-appearing lymph nodes. IMPRESSION: 1.  There is a suspicious mass in the left breast at 7 o'clock. 2.  No evidence of left axillary lymphadenopathy. RECOMMENDATION: Ultrasound guided biopsy is recommended for the left breast mass. I have discussed the findings and recommendations with the patient. If applicable, a reminder letter will be sent to the patient regarding the next appointment. BI-RADS CATEGORY  5: Highly suggestive of malignancy. Electronically Signed   By: Ammie Ferrier M.D.   On: 06/07/2020 14:01   MM DIAG BREAST TOMO UNI LEFT  Result Date: 06/07/2020 CLINICAL DATA:  Screening recall for a possible left breast mass. EXAM: DIGITAL DIAGNOSTIC LEFT MAMMOGRAM WITH CAD AND TOMO ULTRASOUND LEFT BREAST COMPARISON:  Previous exam(s). ACR Breast Density Category b: There are scattered areas of fibroglandular density. FINDINGS: Spot compression tomosynthesis images reveal a persistent irregular mass/focal asymmetry in the lower inner posterior left breast. Mammographic images were processed with CAD. Ultrasound targeted to the left breast at 7 o'clock, 3 cm from the nipple demonstrates a subtle shadowing mass measuring 7 x 4 x 7 mm. There is a prominent blood vessel running adjacent to the mass. Ultrasound of the left axilla demonstrates multiple normal-appearing lymph nodes. IMPRESSION: 1.  There is a suspicious mass in the left breast at 7 o'clock. 2.  No evidence of left axillary lymphadenopathy. RECOMMENDATION: Ultrasound guided biopsy is recommended for the left breast mass. I have discussed the findings and recommendations with the patient. If applicable, a reminder letter will be sent to the patient regarding the  next appointment. BI-RADS  CATEGORY  5: Highly suggestive of malignancy. Electronically Signed   By: Ammie Ferrier M.D.   On: 06/07/2020 14:01   MM 3D SCREEN BREAST BILATERAL  Result Date: 05/29/2020 CLINICAL DATA:  Screening. EXAM: DIGITAL SCREENING BILATERAL MAMMOGRAM WITH TOMO AND CAD COMPARISON:  Previous exam(s). ACR Breast Density Category b: There are scattered areas of fibroglandular density. FINDINGS: In the left breast, a possible mass warrants further evaluation. In the right breast, no findings suspicious for malignancy. Images were processed with CAD. IMPRESSION: Further evaluation is suggested for possible mass in the left breast. RECOMMENDATION: Diagnostic mammogram and possibly ultrasound of the left breast. (Code:[id number]) The patient will be contacted regarding the findings, and additional imaging will be scheduled. BI-RADS CATEGORY  0: Incomplete. Need additional imaging evaluation and/or prior mammograms for comparison. Electronically Signed   By: Dorise Bullion III M.D   On: 05/29/2020 16:42   MM CLIP PLACEMENT LEFT  Result Date: 06/12/2020 CLINICAL DATA:  Post ultrasound-guided biopsy of a mass in the left breast at the 7 o'clock position. EXAM: DIAGNOSTIC LEFT MAMMOGRAM POST ULTRASOUND BIOPSY COMPARISON:  Previous exams. FINDINGS: Mammographic images were obtained following ultrasound guided biopsy of a mass in the left breast at the 7 o'clock position. A ribbon shaped biopsy marking clip is present and located at the anterior margin of the biopsied mass in the left breast at the 7 o'clock position. There is post biopsy change/hematoma obscuring visualization of the mass on the ML tomograms. IMPRESSION: Ribbon shaped biopsy marking clip at anterior margin of biopsied mass in the left breast at the 7 o'clock position. Final Assessment: Post Procedure Mammograms for Marker Placement Electronically Signed   By: Everlean Alstrom M.D.   On: 06/12/2020 09:47   DG Hip Unilat W OR W/O  Pelvis 2-3 Views Left  Result Date: 06/08/2020 CLINICAL DATA:  Left hip pain after fall. EXAM: DG HIP (WITH OR WITHOUT PELVIS) 2-3V LEFT COMPARISON:  None. FINDINGS: Hip joints and SI joints are symmetric and unremarkable. No acute bony abnormality. Specifically, no fracture, subluxation, or dislocation. Degenerative changes and scoliosis seen in the visualized lower lumbar spine. IMPRESSION: No acute bony abnormality. Electronically Signed   By: Rolm Baptise M.D.   On: 06/08/2020 21:05   [place] LT BREAST BX W LOC DEV 1ST LESION IMG BX SPEC US GUIDE  Addendum Date: 06/14/2020   ADDENDUM REPORT: 06/14/2020 13:15 ADDENDUM: PATHOLOGY revealed: A. BREAST, LEFT 7:00 3 CM FN; ULTRASOUND-GUIDED BIOPSY: - INVASIVE MAMMARY CARCINOMA WITH MUCINOUS AND MICROPAPILLARY FEATURES. 5 mm in this sample. Grade 2. Ductal carcinoma in situ: Not identified. Lymphovascular invasion: Not identified. Pathology results are CONCORDANT with imaging findings, per Dr. Everlean Alstrom. Pathology results and recommendations below were discussed with patient by telephone on 06/13/2020. Patient reported biopsy site doing well with slight tenderness at the site. Post biopsy care instructions were reviewed and questions were answered. Patient was instructed to call [facility] if any concerns or questions arise related to the biopsy. Recommendation: Surgical referral. Request for surgical referral was relayed to [place] and Tanya Nones RN at [facility] by Electa Sniff RN on 06/13/2020. Addendum by Electa Sniff RN on 06/14/2020. Electronically Signed   By: Everlean Alstrom M.D.   On: 06/14/2020 13:15   Result Date: 06/14/2020 CLINICAL DATA:  74 year old female with a suspicious mass in the left breast at the 7 o'clock position. EXAM: ULTRASOUND GUIDED LEFT BREAST CORE NEEDLE BIOPSY COMPARISON:  Previous exam(s). PROCEDURE: I met with the  patient and we discussed the procedure of ultrasound-guided biopsy,  including benefits and alternatives. We discussed the high likelihood of a successful procedure. We discussed the risks of the procedure, including infection, bleeding, tissue injury, clip migration, and inadequate sampling. Informed written consent was given. The usual time-out protocol was performed immediately prior to the procedure. Lesion quadrant: Lower inner Using sterile technique and 1% Lidocaine as local anesthetic, under direct ultrasound visualization, a 14 gauge spring-loaded device was used to perform biopsy of the mass in the left breast at the 7 o'clock position using a medial to lateral approach. At the conclusion of the procedure a ribbon shaped tissue marker clip was deployed into the biopsy cavity. Follow up 2 view mammogram was performed and dictated separately. IMPRESSION: Ultrasound guided biopsy of the mass in the left breast at the 7 position. No apparent complications. Electronically Signed: By: Everlean Alstrom M.D. On: 06/12/2020 09:48    Assessment and plan- Patient is a 74 y.o. 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  Overall she has a small 7 mm grade 2 invasive mammary carcinoma.  ER/PR and HER-2 status is currently pending.  No abnormal appearing left axillary lymph nodes.  At this time I would recommend upfront lumpectomy and sentinel lymph node biopsy.  I will see her back after surgery to discuss final pathology results and further management.  If she has ER/PR positive and HER-2 negative tumor there would be role for endocrine therapy for 5 years.  She would also benefit from postlumpectomy radiation. Treatment will be given with a curative intent   Thank you for this kind referral and the opportunity to participate in the care of this patient   Visit Diagnosis 1. Malignant neoplasm of  lower-outer quadrant of left female breast, unspecified estrogen receptor status ([place])   2. Goals of care, counseling/discussion     Dr. Randa Evens, MD, MPH [facility] [phone] 06/23/2020 7:17 AM

## 2020-06-19 NOTE — Progress Notes (Signed)
Pt with left breast cancer and she is hear to see what to do about it , she is anxious about dx. And

## 2020-06-21 ENCOUNTER — Telehealth: Payer: Self-pay

## 2020-06-21 NOTE — Telephone Encounter (Signed)
BILLED MISSED APPOINTMENT FEE 06/15/2020

## 2020-06-25 ENCOUNTER — Ambulatory Visit (INDEPENDENT_AMBULATORY_CARE_PROVIDER_SITE_OTHER): Payer: Medicare Other | Admitting: Surgery

## 2020-06-25 ENCOUNTER — Encounter: Payer: Self-pay | Admitting: Surgery

## 2020-06-25 ENCOUNTER — Other Ambulatory Visit: Payer: Self-pay | Admitting: Surgery

## 2020-06-25 ENCOUNTER — Other Ambulatory Visit: Payer: Self-pay

## 2020-06-25 VITALS — BP 147/88 | HR 99 | Temp 97.9°F | Ht 65.0 in | Wt 138.0 lb

## 2020-06-25 DIAGNOSIS — C50012 Malignant neoplasm of nipple and areola, left female breast: Secondary | ICD-10-CM

## 2020-06-25 DIAGNOSIS — C50912 Malignant neoplasm of unspecified site of left female breast: Secondary | ICD-10-CM | POA: Diagnosis not present

## 2020-06-25 NOTE — Patient Instructions (Signed)
We have spoken today about removing a lump in your breast. This will be done by Dr. Dahlia Byes at [facility].  You will most likely be able to leave the hospital several hours after your surgery. Rarely, a patient needs to stay over night but this is a possibility.  Plan to tenatively be off work for 1-2 weeks following the surgery and may return with approximately 4 more weeks of a lifting restriction, no greater than 15 lbs.  Our surgery scheduler will contact you to look at surgery dates and go over information.    Lumpectomy A lumpectomy is a form of "breast conserving" or "breast preservation" surgery. It may also be referred to as a partial mastectomy. During a lumpectomy, the portion of the breast that contains the cancerous tumor or breast mass (the lump) is removed. Some normal tissue around the lump may also be removed to make sure all of the tumor has been removed.  LET [facility] CARE PROVIDER KNOW ABOUT:  Any allergies you have.  All medicines you are taking, including vitamins, herbs, eye drops, creams, and over-the-counter medicines.  Previous problems you or members of your family have had with the use of anesthetics.  Any blood disorders you have.  Previous surgeries you have had.  Medical conditions you have. RISKS AND COMPLICATIONS Generally, this is a safe procedure. However, problems can occur and include:  Bleeding.  Infection.  Pain.  Temporary swelling.  Change in the shape of the breast, particularly if a large portion is removed. BEFORE THE PROCEDURE  Ask your health care provider about changing or stopping your regular medicines. This is especially important if you are taking diabetes medicines or blood thinners.  Do not eat or drink anything after midnight on the night before the procedure or as directed by your health care provider. Ask your health care provider if you can take a sip of water with any approved medicines.  On the day of surgery, your health  care provider will use a mammogram or ultrasound to locate and mark the tumor in your breast. These markings on your breast will show where the cut (incision) will be made. PROCEDURE   An IV tube will be put into one of your veins.  You may be given medicine to help you relax before the surgery (sedative). You will be given one of the following:  A medicine that numbs the area (local anesthetic).  A medicine that makes you fall asleep (general anesthetic).  Your health care provider will use a kind of electric scalpel that uses heat to minimize bleeding (electrocautery knife).  A curved incision (like a smile or frown) that follows the natural curve of your breast is made, to allow for minimal scarring and better healing.  The tumor will be removed with some of the surrounding tissue. This will be sent to the lab for analysis. Your health care provider may also remove your lymph nodes at this time if needed.  Sometimes, but not always, a rubber tube called a drain will be surgically inserted into your breast area or armpit to collect excess fluid that may accumulate in the space where the tumor was. This drain is connected to a plastic bulb on the outside of your body. This drain creates suction to help remove the fluid.  The incisions will be closed with stitches (sutures).  A bandage may be placed over the incisions. AFTER THE PROCEDURE  You will be taken to the recovery area.  You will be  given medicine for pain.  A small rubber drain may be placed in the breast for 2-3 days to prevent a collection of blood (hematoma) from developing in the breast. You will be given instructions on caring for the drain before you go home.  A pressure bandage (dressing) will be applied for 1-2 days to prevent bleeding. Ask your health care provider how to care for your bandage at home.   This information is not intended to replace advice given to you by your health care provider. Make sure you  discuss any questions you have with your health care provider.   Document Released: 01/26/2007 Document Revised: 01/05/2015 Document Reviewed: 05/20/2013 Elsevier Interactive Patient Education [employer].

## 2020-06-26 NOTE — H&P (View-Only) (Signed)
Outpatient Surgical Follow Up  06/26/2020  Virginia Warner is an 74 y.o. female.   Chief Complaint  Patient presents with  . Follow-up    DCIS    HPI: Virginia Warner is a 74 y.o. 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  She was found to have an abnormality on the left side on mammogram.  Please note that I have personally reviewed the images.  She underwent core needle biopsy of the left breast under ultrasound guidance.  Pathology reviewed and discussed with the patient in detail.  Triple negative breast cancer..  No evidence of axillary involvement.  Patient measured 7 mm.  CBC and CMP were completely normal. She has a history of abdominal hysterectomy.  Family did have breast cyst on the right side several years ago. She is able to perform more than 4 METS of activity without any shortness of breath or chest pain. Most recent biopsy shows evidence of ER/PR positive and HER-2 negative.  She is interested in breast conservation therapy.  Case  discussed with Dr. Janese Banks  Past Medical History:  Diagnosis Date  . Allergy   . Breast cancer ([place])   . Hyperlipidemia   . Hypertension   . Seasonal allergies     Past Surgical History:  Procedure Laterality Date  . ABDOMINAL HYSTERECTOMY     partial  . BREAST BIOPSY Left 06/12/2020   [place] Bx, Ribbon Clip, path pending   . COLONOSCOPY WITH PROPOFOL N/A 10/18/2015   Procedure: COLONOSCOPY WITH PROPOFOL;  Surgeon: Manya Silvas, MD;  Location: [facility] ENDOSCOPY;  Service: Endoscopy;  Laterality: N/A;  . INGUINAL HERNIA REPAIR     age 36's  . POLYPECTOMY      Family History  Problem Relation Age of Onset  . Cancer Brother   . Breast cancer Paternal Grandmother     Social History:  reports that she quit smoking about 12 years ago. She has never used smokeless tobacco. She reports current alcohol use. She reports that she does not use drugs.  Allergies:  Allergies  Allergen Reactions  .  Aspirin Other (See Comments)    Cannot tolerate unless enteric coated  . Atorvastatin Other (See Comments)    Medications reviewed.    ROS Full ROS performed and is otherwise negative other than what is stated in HPI   BP (!) 147/88   Pulse 99   Temp 97.9 F (36.6 C)   Ht _0  (1.651 m)   Wt 138 lb (62.6 kg)   SpO2 94%   BMI 22.96 kg/m   Physical Exam Vitals and nursing note reviewed. Exam conducted with a chaperone present.  Constitutional:      General: She is not in acute distress.    Appearance: Normal appearance. She is normal weight.  Eyes:     General: No scleral icterus.       Right eye: No discharge.        Left eye: No discharge.  Cardiovascular:     Rate and Rhythm: Normal rate and regular rhythm.     Heart sounds: No murmur heard.   Pulmonary:     Effort: Pulmonary effort is normal. No respiratory distress.     Breath sounds: Normal breath sounds. No stridor. No wheezing or rhonchi.     Comments: BREAST: Left breast with previous biopsy scar, resolving hematoma.  I am unable to palpate any definitive masses in either breast.  No evidence of lymphadenopathy. Abdominal:  General: Abdomen is flat. There is no distension.     Palpations: Abdomen is soft. There is no mass.     Tenderness: There is no abdominal tenderness. There is no guarding or rebound.     Hernia: No hernia is present.  Genitourinary:    General: Normal vulva.     Rectum: Normal.  Musculoskeletal:        General: Normal range of motion.     Cervical back: Normal range of motion and neck supple. No rigidity or tenderness.  Skin:    General: Skin is warm and dry.     Capillary Refill: Capillary refill takes less than 2 seconds.  Neurological:     General: No focal deficit present.     Mental Status: She is alert and oriented to person, place, and time.  Psychiatric:        Mood and Affect: Mood normal.        Behavior: Behavior normal.        Thought Content: Thought content  normal.        Judgment: Judgment normal.      Assessment/Plan: 74 year old female with a recently diagnosed left breast cancer.  Discussed with the patient detail the options from a surgical perspective.  She chooses to proceed with left lumpectomy and sentinel lymph node biopsy.  Procedure discussed with the patient in detail.  Risks, benefits and possible complications including but not limited to: Bleeding, infection, seromas, lymphedema, nerve injuries, re-excision ( positive margins) and possible re interventions.  She understands and wished to proceed.  Greater than 50% of the 45 minutes  visit was spent in counseling/coordination of care   Caroleen Hamman, MD [facility]

## 2020-06-26 NOTE — Progress Notes (Signed)
Outpatient Surgical Follow Up  06/26/2020  Virginia Warner is an 74 y.o. female.   Chief Complaint  Patient presents with  . Follow-up    DCIS    HPI: Virginia Warner is a 74 y.o. 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  She was found to have an abnormality on the left side on mammogram.  Please note that I have personally reviewed the images.  She underwent core needle biopsy of the left breast under ultrasound guidance.  Pathology reviewed and discussed with the patient in detail.  Triple negative breast cancer..  No evidence of axillary involvement.  Patient measured 7 mm.  CBC and CMP were completely normal. She has a history of abdominal hysterectomy.  Family did have breast cyst on the right side several years ago. She is able to perform more than 4 METS of activity without any shortness of breath or chest pain. Most recent biopsy shows evidence of ER/PR positive and HER-2 negative.  She is interested in breast conservation therapy.  Case  discussed with Dr. Janese Banks  Past Medical History:  Diagnosis Date  . Allergy   . Breast cancer ([place])   . Hyperlipidemia   . Hypertension   . Seasonal allergies     Past Surgical History:  Procedure Laterality Date  . ABDOMINAL HYSTERECTOMY     partial  . BREAST BIOPSY Left 06/12/2020   [place] Bx, Ribbon Clip, path pending   . COLONOSCOPY WITH PROPOFOL N/A 10/18/2015   Procedure: COLONOSCOPY WITH PROPOFOL;  Surgeon: Manya Silvas, MD;  Location: [facility] ENDOSCOPY;  Service: Endoscopy;  Laterality: N/A;  . INGUINAL HERNIA REPAIR     age 36's  . POLYPECTOMY      Family History  Problem Relation Age of Onset  . Cancer Brother   . Breast cancer Paternal Grandmother     Social History:  reports that she quit smoking about 12 years ago. She has never used smokeless tobacco. She reports current alcohol use. She reports that she does not use drugs.  Allergies:  Allergies  Allergen Reactions  .  Aspirin Other (See Comments)    Cannot tolerate unless enteric coated  . Atorvastatin Other (See Comments)    Medications reviewed.    ROS Full ROS performed and is otherwise negative other than what is stated in HPI   BP (!) 147/88   Pulse 99   Temp 97.9 F (36.6 C)   Ht _0  (1.651 m)   Wt 138 lb (62.6 kg)   SpO2 94%   BMI 22.96 kg/m   Physical Exam Vitals and nursing note reviewed. Exam conducted with a chaperone present.  Constitutional:      General: She is not in acute distress.    Appearance: Normal appearance. She is normal weight.  Eyes:     General: No scleral icterus.       Right eye: No discharge.        Left eye: No discharge.  Cardiovascular:     Rate and Rhythm: Normal rate and regular rhythm.     Heart sounds: No murmur heard.   Pulmonary:     Effort: Pulmonary effort is normal. No respiratory distress.     Breath sounds: Normal breath sounds. No stridor. No wheezing or rhonchi.     Comments: BREAST: Left breast with previous biopsy scar, resolving hematoma.  I am unable to palpate any definitive masses in either breast.  No evidence of lymphadenopathy. Abdominal:  General: Abdomen is flat. There is no distension.     Palpations: Abdomen is soft. There is no mass.     Tenderness: There is no abdominal tenderness. There is no guarding or rebound.     Hernia: No hernia is present.  Genitourinary:    General: Normal vulva.     Rectum: Normal.  Musculoskeletal:        General: Normal range of motion.     Cervical back: Normal range of motion and neck supple. No rigidity or tenderness.  Skin:    General: Skin is warm and dry.     Capillary Refill: Capillary refill takes less than 2 seconds.  Neurological:     General: No focal deficit present.     Mental Status: She is alert and oriented to person, place, and time.  Psychiatric:        Mood and Affect: Mood normal.        Behavior: Behavior normal.        Thought Content: Thought content  normal.        Judgment: Judgment normal.      Assessment/Plan: 74 year old female with a recently diagnosed left breast cancer.  Discussed with the patient detail the options from a surgical perspective.  She chooses to proceed with left lumpectomy and sentinel lymph node biopsy.  Procedure discussed with the patient in detail.  Risks, benefits and possible complications including but not limited to: Bleeding, infection, seromas, lymphedema, nerve injuries, re-excision ( positive margins) and possible re interventions.  She understands and wished to proceed.  Greater than 50% of the 45 minutes  visit was spent in counseling/coordination of care   Caroleen Hamman, MD [facility]

## 2020-06-27 ENCOUNTER — Telehealth: Payer: Self-pay | Admitting: Surgery

## 2020-06-27 NOTE — Telephone Encounter (Signed)
Pt has been advised of Pre-Admission date/time, COVID Testing date and Surgery date.  Surgery Date: 07/10/20 Preadmission Testing Date: 07/04/20 (phone 8a-1p) Covid Testing Date: 07/06/20 - patient advised to go to the [facility] between 8a-1p   Patient also having SLN Bx done prior to surgery same day to arrive at 10:15 am at the [facility]  Patient voices understanding with all.

## 2020-06-29 ENCOUNTER — Telehealth: Payer: Self-pay

## 2020-06-29 NOTE — Telephone Encounter (Signed)
Lmom to confirm and screen for 07-03-20 ov. 

## 2020-07-03 ENCOUNTER — Ambulatory Visit (INDEPENDENT_AMBULATORY_CARE_PROVIDER_SITE_OTHER): Payer: Medicare Other | Admitting: Nurse Practitioner

## 2020-07-03 ENCOUNTER — Encounter: Payer: Self-pay | Admitting: Nurse Practitioner

## 2020-07-03 ENCOUNTER — Other Ambulatory Visit: Payer: Self-pay

## 2020-07-03 VITALS — BP 135/74 | HR 101 | Temp 97.6°F | Resp 16 | Ht 65.0 in | Wt 144.8 lb

## 2020-07-03 DIAGNOSIS — M25552 Pain in left hip: Secondary | ICD-10-CM | POA: Diagnosis not present

## 2020-07-03 DIAGNOSIS — F5102 Adjustment insomnia: Secondary | ICD-10-CM | POA: Diagnosis not present

## 2020-07-03 DIAGNOSIS — I10 Essential (primary) hypertension: Secondary | ICD-10-CM | POA: Diagnosis not present

## 2020-07-03 DIAGNOSIS — C50512 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 breast: Secondary | ICD-10-CM

## 2020-07-03 NOTE — Progress Notes (Signed)
[facility] [place], [place] [address]  Internal MEDICINE  Office Visit Note  Patient Name: Virginia Warner  [id number]  [id number]  Date of Service: 07/03/2020  Chief Complaint  Patient presents with  . Follow-up  . Hyperlipidemia  . Hypertension    The patient is here for follow up visit. She did have x-ray of her left hip. She does have degenerative changes and scoliosis. No acute abnormalities noted of hip. She was diagnosed with kidney stones and was referred to urology at her last visit. She is holding off on further evaluation from urology as she was also diagnosed with breast cancer of left breast. Is scheduled to have lumpectomy on this upcoming Tuesday. She will have to have radiation radiation after surgery. She may also need to take hormone receptor blocker for five to ten years after that. She states she will deal with kidney stones after she is finished with treatments for breast cancer.   Her blood pressure is better at today's visit than it has been in some time. She is very worried about surgery and treatment. She does have prescription for trazodone which she can take as needed to help with anxiety and insomnia. She has not taken this yet, but plans to start taking this on Sunday night.  Continues to have intermittent left hip. 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       Current Medication: Outpatient Encounter Medications as of 07/03/2020  Medication Sig Note  . acetaminophen (TYLENOL) 500 MG tablet Take 500 mg by mouth daily as needed for moderate pain.   Marland Kitchen alendronate (FOSAMAX) 70 MG tablet Take 1 tablet (70 mg total) by mouth once a week. Take with a full glass of water on an empty stomach. (Patient not taking: Reported on 06/18/2020)   . amLODipine (NORVASC) 5 MG tablet Take 1 tablet (5 mg total) by mouth daily.   Marland Kitchen aspirin EC 81 MG tablet Take 81 mg by mouth daily.    . calcium  carbonate (TUMS - DOSED IN MG ELEMENTAL CALCIUM) 500 MG chewable tablet Chew 1-2 tablets by mouth daily as needed for indigestion or heartburn.   . Calcium Carbonate-Vitamin D (CALTRATE 600+D PO) Take 1 tablet by mouth daily.   Marland Kitchen esomeprazole (NEXIUM) 20 MG capsule Take 1 capsule (20 mg total) by mouth daily. (Patient not taking: Reported on 06/18/2020)   . fexofenadine (ALLEGRA) 180 MG tablet Take 180 mg by mouth daily.   . fluticasone (FLONASE) 50 MCG/ACT nasal spray Place 2 sprays into both nostrils daily. (Patient taking differently: Place 2 sprays into both nostrils daily as needed for allergies. )   . latanoprost (XALATAN) 0.005 % ophthalmic solution Place 1 drop into both eyes daily.  06/26/2020: Pt states she uses whenever not always at night  . lisinopril-hydrochlorothiazide (ZESTORETIC) 20-12.5 MG tablet Take 1 tablet by mouth 2 (two) times daily. (Patient taking differently: Take 1 tablet by mouth in the morning and at bedtime. )   . methylPREDNISolone (MEDROL) 4 MG TBPK tablet Take by mouth as directed for 6 days (Patient not taking: Reported on 06/18/2020)   . Multiple Vitamin (MULTIVITAMIN WITH MINERALS) TABS tablet Take 1 tablet by mouth daily. 06/26/2020: Pt ran out, needs to get more  . traZODone (DESYREL) 50 MG tablet Take 1/2 to 1 tablet po QHS prn anxiety/insomnia (Patient taking differently: Take 25-50 mg by mouth at bedtime as needed (anxiety / sleep). )  06/26/2020: Hasnt started   No facility-administered encounter medications on file as of 07/03/2020.    Surgical History: Past Surgical History:  Procedure Laterality Date  . ABDOMINAL HYSTERECTOMY     partial  . BREAST BIOPSY Left 06/12/2020   [place] Bx, Ribbon Clip, path pending   . COLONOSCOPY WITH PROPOFOL N/A 10/18/2015   Procedure: COLONOSCOPY WITH PROPOFOL;  Surgeon: Manya Silvas, MD;  Location: [facility] ENDOSCOPY;  Service: Endoscopy;  Laterality: N/A;  . INGUINAL HERNIA REPAIR     age 51's  . POLYPECTOMY      Medical  History: Past Medical History:  Diagnosis Date  . Allergy   . Breast cancer ([place])   . Hyperlipidemia   . Hypertension   . Seasonal allergies     Family History: Family History  Problem Relation Age of Onset  . Cancer Brother   . Breast cancer Paternal Grandmother     Social History   Socioeconomic History  . Marital status: Divorced    Spouse name: Not on file  . Number of children: Not on file  . Years of education: Not on file  . Highest education level: Not on file  Occupational History  . Not on file  Tobacco Use  . Smoking status: Former Smoker    Quit date: 11/2007    Years since quitting: 12.6  . Smokeless tobacco: Never Used  Vaping Use  . Vaping Use: Never used  Substance and Sexual Activity  . Alcohol use: Yes    Alcohol/week: 0.0 standard drinks    Comment: everyday either beer or wine  . Drug use: No  . Sexual activity: Not Currently  Other Topics Concern  . Not on file  Social History Narrative  . Not on file   Social Determinants of Health   Financial Resource Strain:   . Difficulty of Paying Living Expenses:   Food Insecurity:   . Worried About Charity fundraiser in the Last Year:   . Arboriculturist in the Last Year:   Transportation Needs:   . Film/video editor (Medical):   Marland Kitchen Lack of Transportation (Non-Medical):   Physical Activity:   . Days of Exercise per Week:   . Minutes of Exercise per Session:   Stress:   . Feeling of Stress :   Social Connections:   . Frequency of Communication with Friends and Family:   . Frequency of Social Gatherings with Friends and Family:   . Attends Religious Services:   . Active Member of Clubs or Organizations:   . Attends Archivist Meetings:   Marland Kitchen Marital Status:   Intimate Partner Violence:   . Fear of Current or Ex-Partner:   . Emotionally Abused:   Marland Kitchen Physically Abused:   . Sexually Abused:       Review of Systems  Constitutional: Negative for activity change, chills,  fatigue and unexpected weight change.  HENT: Negative for congestion, postnasal drip, rhinorrhea, sneezing and sore throat.   Respiratory: Negative for cough, chest tightness and shortness of breath.   Cardiovascular: Negative for chest pain and palpitations.  Gastrointestinal: Negative for abdominal pain, constipation, diarrhea, nausea and vomiting.  Endocrine: Negative for cold intolerance, heat intolerance, polydipsia and polyuria.  Genitourinary: Positive for hematuria. Negative for dysuria, flank pain and frequency.  Musculoskeletal: Positive for arthralgias. Negative for back pain, joint swelling and neck pain.       Left hip pain. Hurts more with rainy weather.   Skin: Negative for rash.  Allergic/Immunologic: Negative for environmental allergies.  Neurological: Negative for dizziness, tremors, numbness and headaches.  Hematological: Negative for adenopathy. Does not bruise/bleed easily.  Psychiatric/Behavioral: Positive for sleep disturbance. Negative for behavioral problems (Depression) and suicidal ideas. The patient is nervous/anxious.     Today's Vitals   07/03/20 1340  BP: 135/74  Pulse: (!) 101  Resp: 16  Temp: 97.6 F (36.4 C)  SpO2: 98%  Weight: 144 lb 12.8 oz (65.7 kg)  Height: 5\' 5"  (1.651 m)   Body mass index is 24.1 kg/m.  Physical Exam Vitals and nursing note reviewed.  Constitutional:      General: She is not in acute distress.    Appearance: Normal appearance. She is well-developed. She is not diaphoretic.  HENT:     Head: Normocephalic and atraumatic.     Mouth/Throat:     Pharynx: No oropharyngeal exudate.  Eyes:     Pupils: Pupils are equal, round, and reactive to light.  Neck:     Thyroid: No thyromegaly.     Vascular: No JVD.     Trachea: No tracheal deviation.  Cardiovascular:     Rate and Rhythm: Normal rate and regular rhythm.     Heart sounds: Normal heart sounds. No murmur heard.  No friction rub. No gallop.   Pulmonary:     Effort:  Pulmonary effort is normal. No respiratory distress.     Breath sounds: Normal breath sounds. No wheezing or rales.  Chest:     Chest wall: No tenderness.  Abdominal:     Palpations: Abdomen is soft.  Musculoskeletal:        General: Normal range of motion.     Cervical back: Normal range of motion and neck supple.     Comments: Left hip tenderness. Worse with sitting or standing for long periods of time. There are no palpable abnormalities or deformities noted.   Lymphadenopathy:     Cervical: No cervical adenopathy.  Skin:    General: Skin is warm and dry.  Neurological:     Mental Status: She is alert and oriented to person, place, and time.     Cranial Nerves: No cranial nerve deficit.  Psychiatric:        Attention and Perception: Attention and perception normal.        Mood and Affect: Affect normal. Mood is anxious.        Speech: Speech normal.        Behavior: Behavior normal. Behavior is cooperative.        Thought Content: Thought content normal.        Cognition and Memory: Cognition and memory normal.        Judgment: Judgment normal.    Assessment/Plan: 1. Left hip pain Reviewed x-ray results with patient showing mild degenerative arthritis of lumbar spine with no acute abnormalities. Will continue to monitor. Was prescribed medrol dose pack to reduce inflammation. Will hold this for now, as she is getting ready to have surgery to remove malignancy from left breat.   2. Essential (primary) hypertension Stable. No changes to blood pressure medication today.   3. 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 breast, unspecified estrogen receptor status ([place]) Scheduled to have surgery to remove tumor from left breast this coming Tuesday. May require radiation following surgery. Will monitor closely.   4. Adjustment insomnia Take trazodone as needed and as prescribed for anxiety/insomnia.   General Counseling: zaydee aina understanding of the  findings of todays visit and agrees  with plan of treatment. I have discussed any further diagnostic evaluation that may be needed or ordered today. We also reviewed her medications today. she has been encouraged to call the office with any questions or concerns that should arise related to todays visit.  This patient was seen by Leretha Pol FNP Collaboration with Dr [name] as a part of collaborative care agreement  Total time spent: 25 Minutes   Time spent includes review of chart, medications, test results, and follow up plan with the patient.      Dr [name] Internal medicine

## 2020-07-04 ENCOUNTER — Other Ambulatory Visit
Admission: RE | Admit: 2020-07-04 | Discharge: 2020-07-04 | Disposition: A | Discharge status: Home / Self Care | Payer: Medicare Other | Source: Ambulatory Visit | Attending: Surgery | Admitting: Surgery

## 2020-07-04 ENCOUNTER — Ambulatory Visit
Admission: RE | Admit: 2020-07-04 | Discharge: 2020-07-04 | Disposition: A | Discharge status: Home / Self Care | Payer: Medicare Other | Source: Ambulatory Visit | Attending: Surgery | Admitting: Surgery

## 2020-07-04 DIAGNOSIS — C50012 Malignant neoplasm of nipple and areola, left female breast: Secondary | ICD-10-CM | POA: Diagnosis not present

## 2020-07-04 HISTORY — PX: BREAST LUMPECTOMY: SHX2

## 2020-07-04 NOTE — Patient Instructions (Signed)
COVID TESTING Date: July 06, 2020 Testing site:  [place] [address] Thru Hours:  0:34 am - 1:00 pm Once you are tested, you are asked to stay quarantined (avoiding public places) until after your surgery.   Your procedure is scheduled on: July 10, 2020 TUESDAY REPORT TO MEDICAL MALL AT 10:15 AM  REMEMBER: Instructions that are not followed completely may result in serious medical risk, up to and including death; or upon the discretion of your surgeon and anesthesiologist your surgery may need to be rescheduled.  Do not eat food after midnight the night before surgery.  No gum chewing, lozengers or hard candies.  You may however, drink CLEAR liquids up to 2 hours before you are scheduled to arrive for your surgery. Do not drink anything within 2 hours of your scheduled arrival time.  Clear liquids include: - water  - apple juice without pulp - gatorade (not RED) - black coffee or tea (Do NOT add milk or creamers to the coffee or tea) Do NOT drink anything that is not on this list.  Type 1 and Type 2 diabetics should only drink water.   TAKE THESE MEDICATIONS THE MORNING OF SURGERY WITH A SIP OF WATER: AMLODIPINE ALLEGRA   USE FLONASE  Follow recommendations from Cardiologist, Pulmonologist or PCP regarding stopping Aspirin, Coumadin, Plavix, Eliquis, Pradaxa, or Pletal. STOP ASPIRIN  Stop Anti-inflammatories (NSAIDS) such as Advil, Aleve, Ibuprofen, Motrin, Naproxen, Naprosyn and Aspirin based products such as Excedrin, Goodys Powder, BC Powder. (May take Tylenol or Acetaminophen if needed.)  Stop ANY OVER THE COUNTER supplements until after surgery.  (May continue Vitamin D, Vitamin B, and multivitamin.)  No Alcohol for 24 hours before or after surgery.  No Smoking including e-cigarettes for 24 hours prior to surgery.  No chewable tobacco products for at least 6 hours prior to surgery.  No nicotine patches on the day of surgery.  Do  not use any "recreational" drugs for at least a week prior to your surgery.  Please be advised that the combination of cocaine and anesthesia may have negative outcomes, up to and including death. If you test positive for cocaine, your surgery will be cancelled.  On the morning of surgery brush your teeth with toothpaste and water, you may rinse your mouth with mouthwash if you wish. Do not swallow any toothpaste or mouthwash.  Do not wear jewelry, make-up, hairpins, clips or nail polish.  Do not wear lotions, powders, or perfumes OR DEODORANT  Do not shave 48 hours prior to surgery.   Contact lenses, hearing aids and dentures may not be worn into surgery.  Do not bring valuables to the hospital. [facility] is not responsible for any missing/lost belongings or valuables.   Use CHG Soap  as directed on instruction sheet.  Notify your doctor if there is any change in your medical condition (cold, fever, infection).  Wear comfortable clothing (specific to your surgery type) to the hospital.  Plan for stool softeners for home use; pain medications have a tendency to cause constipation. You can also help prevent constipation by eating foods high in fiber such as fruits and vegetables and drinking plenty of fluids as your diet allows.  After surgery, you can help prevent lung complications by doing breathing exercises.  Take deep breaths and cough every 1-2 hours. Your doctor may order a device called an Incentive Spirometer to help you take deep breaths. When coughing or sneezing, hold a pillow firmly against your  incision with both hands. This is called "splinting." Doing this helps protect your incision. It also decreases belly discomfort.  If you are being discharged the day of surgery, you will not be allowed to drive home. You will need a responsible adult (18 years or older) to drive you home and stay with you that night.   If you are taking public transportation, you will need to  have a responsible adult (18 years or older) with you. Please confirm with your physician that it is acceptable to use public transportation.   Please call the [facility]. at [phone] if you have any questions about these instructions.  Visitation Policy:  Patients undergoing a surgery or procedure may have one family member or support person with them as long as that person is not COVID-19 positive or experiencing its symptoms.  That person may remain in the waiting area during the procedure.  Children under 72 years of age may have both parents or legal guardians with them during their procedure.  Inpatient Visitation Update:   Two designated support people may visit a patient during visiting hours 7 am to 8 pm. It must be the same two designated people for the duration of the patient stay. The visitors may come and go during the day, and there is no switching out to have different visitors. A mask must be worn at all times, including in the patient room.  Children under 2 years of age:  a total of 4 designated visitors for the child's entire stay are allowed. Only 2 in the room at a time and only one staying overnight at a time. The overnight guest can now rotate during the child's hospital stay.  As a reminder, masks are still required for all [place] team members, patients and visitors in all [place] facilities.   Systemwide, no visitors 17 or younger.

## 2020-07-06 ENCOUNTER — Other Ambulatory Visit: Payer: Self-pay

## 2020-07-06 ENCOUNTER — Encounter
Admission: RE | Admit: 2020-07-06 | Discharge: 2020-07-06 | Disposition: A | Discharge status: Home / Self Care | Payer: Medicare Other | Source: Ambulatory Visit | Attending: Surgery | Admitting: Surgery

## 2020-07-06 DIAGNOSIS — Z20822 Contact with and (suspected) exposure to covid-19: Secondary | ICD-10-CM | POA: Insufficient documentation

## 2020-07-06 DIAGNOSIS — I10 Essential (primary) hypertension: Secondary | ICD-10-CM | POA: Diagnosis not present

## 2020-07-06 DIAGNOSIS — Z01818 Encounter for other preprocedural examination: Secondary | ICD-10-CM | POA: Diagnosis present

## 2020-07-06 LAB — BASIC METABOLIC PANEL
Anion gap: 12 (ref 5–15)
BUN: 8 mg/dL (ref 8–23)
CO2: 26 mmol/L (ref 22–32)
Calcium: 9.4 mg/dL (ref 8.9–10.3)
Chloride: 98 mmol/L (ref 98–111)
Creatinine, Ser: 0.61 mg/dL (ref 0.44–1.00)
GFR calc Af Amer: >60 mL/min (ref >60)
GFR calc non Af Amer: >60 mL/min (ref >60)
Glucose, Bld: 101 mg/dL — ABNORMAL HIGH (ref 70–99)
Potassium: 3 mmol/L — ABNORMAL LOW (ref 3.5–5.1)
Sodium: 136 mmol/L (ref 135–145)

## 2020-07-06 NOTE — Progress Notes (Addendum)
  [facility] Perioperative Services: Pre-Admission/Anesthesia Testing  Abnormal Lab Notification    Date: 07/06/20  Name: VASILISA VORE MRN:   [id number]  Re: Abnormal labs noted during PAT appointment   Provider Notified: Jules Husbands, MD Notification mode: Routed via [facility]   ABNORMAL LAB VALUE(S):  K+ 3.0 mmol/L  Notes: Patient on thiazide diuretic therapy. Scheduled for lumpectomy on 07/10/2020. K+ needs to be optimized. Forwarding to performing surgeon. Will plan on rechecking K+ on the morning of surgery.   Honor Loh, MSN, APRN, FNP-C, CEN [facility]  Peri-operative Services Nurse Practitioner Phone: [phone] 07/06/20 12:38 PM

## 2020-07-07 LAB — SARS CORONAVIRUS 2 (TAT 6-24 HRS): SARS Coronavirus 2: NEGATIVE

## 2020-07-10 ENCOUNTER — Ambulatory Visit
Admission: RE | Admit: 2020-07-10 | Discharge: 2020-07-10 | Disposition: A | Discharge status: Home / Self Care | Payer: Medicare Other | Source: Ambulatory Visit | Attending: Surgery | Admitting: Surgery

## 2020-07-10 ENCOUNTER — Ambulatory Visit
Admission: RE | Admit: 2020-07-10 | Discharge: 2020-07-10 | Disposition: A | Discharge status: Home / Self Care | Payer: Medicare Other | Attending: Surgery | Admitting: Surgery

## 2020-07-10 ENCOUNTER — Encounter: Payer: Self-pay | Admitting: Surgery

## 2020-07-10 ENCOUNTER — Encounter
Admission: RE | Disposition: A | Discharge status: Home / Self Care | Payer: Self-pay | Source: Home / Self Care | Attending: Surgery

## 2020-07-10 ENCOUNTER — Ambulatory Visit: Payer: Medicare Other | Admitting: Urgent Care

## 2020-07-10 ENCOUNTER — Other Ambulatory Visit: Payer: Self-pay

## 2020-07-10 DIAGNOSIS — C50912 Malignant neoplasm of unspecified site of left female breast: Secondary | ICD-10-CM

## 2020-07-10 DIAGNOSIS — E785 Hyperlipidemia, unspecified: Secondary | ICD-10-CM | POA: Insufficient documentation

## 2020-07-10 DIAGNOSIS — C50512 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 breast: Secondary | ICD-10-CM | POA: Diagnosis not present

## 2020-07-10 DIAGNOSIS — Z170 Estrogen receptor positive status [ER+]: Secondary | ICD-10-CM | POA: Diagnosis not present

## 2020-07-10 DIAGNOSIS — C50012 Malignant neoplasm of nipple and areola, left female breast: Secondary | ICD-10-CM

## 2020-07-10 DIAGNOSIS — Z90710 Acquired absence of both cervix and uterus: Secondary | ICD-10-CM | POA: Insufficient documentation

## 2020-07-10 DIAGNOSIS — K219 Gastro-esophageal reflux disease without esophagitis: Secondary | ICD-10-CM | POA: Diagnosis not present

## 2020-07-10 DIAGNOSIS — Z803 Family history of malignant neoplasm of breast: Secondary | ICD-10-CM | POA: Diagnosis not present

## 2020-07-10 DIAGNOSIS — Z809 Family history of malignant neoplasm, unspecified: Secondary | ICD-10-CM | POA: Insufficient documentation

## 2020-07-10 DIAGNOSIS — Z886 Allergy status to analgesic agent status: Secondary | ICD-10-CM | POA: Insufficient documentation

## 2020-07-10 DIAGNOSIS — Z888 Allergy status to other drugs, medicaments and biological substances status: Secondary | ICD-10-CM | POA: Diagnosis not present

## 2020-07-10 DIAGNOSIS — I10 Essential (primary) hypertension: Secondary | ICD-10-CM | POA: Diagnosis not present

## 2020-07-10 DIAGNOSIS — Z87891 Personal history of nicotine dependence: Secondary | ICD-10-CM | POA: Diagnosis not present

## 2020-07-10 DIAGNOSIS — C50312 Malignant neoplasm of lower-inner quadrant of left female breast: Secondary | ICD-10-CM | POA: Diagnosis not present

## 2020-07-10 HISTORY — PX: BREAST LUMPECTOMY,RADIO FREQ LOCALIZER,AXILLARY SENTINEL LYMPH NODE BIOPSY: SHX6900

## 2020-07-10 LAB — POCT I-STAT, CHEM 8
BUN: 9 mg/dL (ref 8–23)
Calcium, Ion: 1.21 mmol/L (ref 1.15–1.40)
Chloride: 101 mmol/L (ref 98–111)
Creatinine, Ser: 0.6 mg/dL (ref 0.44–1.00)
Glucose, Bld: 110 mg/dL — ABNORMAL HIGH (ref 70–99)
HCT: 45 % (ref 36.0–46.0)
Hemoglobin: 15.3 g/dL — ABNORMAL HIGH (ref 12.0–15.0)
Potassium: 3.2 mmol/L — ABNORMAL LOW (ref 3.5–5.1)
Sodium: 142 mmol/L (ref 135–145)
TCO2: 30 mmol/L (ref 22–32)

## 2020-07-10 SURGERY — BREAST LUMPECTOMY,RADIO FREQ LOCALIZER,AXILLARY SENTINEL LYMPH NODE BIOPSY
Anesthesia: General | Laterality: Left

## 2020-07-10 MED ORDER — HYDROCODONE-ACETAMINOPHEN 5-325 MG PO TABS: 1.0000 | ORAL_TABLET | Freq: Four times a day (QID) | ORAL | 0 refills | Status: DC | PRN

## 2020-07-10 MED ORDER — DEXAMETHASONE SODIUM PHOSPHATE 10 MG/ML IJ SOLN
INTRAMUSCULAR | Status: DC | PRN
  Administered 2020-07-10: 10 mg via INTRAVENOUS

## 2020-07-10 MED ORDER — LIDOCAINE HCL (CARDIAC) PF 100 MG/5ML IV SOSY
PREFILLED_SYRINGE | INTRAVENOUS | Status: DC | PRN
  Administered 2020-07-10: 40 mg via INTRAVENOUS
  Administered 2020-07-10: 60 mg via INTRAVENOUS

## 2020-07-10 MED ORDER — ACETAMINOPHEN 10 MG/ML IV SOLN
INTRAVENOUS | Status: DC | PRN
  Administered 2020-07-10: 1000 mg via INTRAVENOUS

## 2020-07-10 MED ORDER — FENTANYL CITRATE (PF) 100 MCG/2ML IJ SOLN: 25.0000 ug | INTRAMUSCULAR | Status: DC | PRN

## 2020-07-10 MED ORDER — ONDANSETRON HCL 4 MG/2ML IJ SOLN
INTRAMUSCULAR | Status: DC | PRN
  Administered 2020-07-10: 4 mg via INTRAVENOUS

## 2020-07-10 MED ORDER — ACETAMINOPHEN 10 MG/ML IV SOLN
INTRAVENOUS | Status: AC
  Filled 2020-07-10: qty 100

## 2020-07-10 MED ORDER — GABAPENTIN 300 MG PO CAPS
ORAL_CAPSULE | ORAL | Status: AC
  Administered 2020-07-10: 300 mg via ORAL
  Filled 2020-07-10: qty 1

## 2020-07-10 MED ORDER — ONDANSETRON HCL 4 MG/2ML IJ SOLN: 4.0000 mg | Freq: Once | INTRAMUSCULAR | Status: DC | PRN

## 2020-07-10 MED ORDER — ISOSULFAN BLUE 1 % ~~LOC~~ SOLN
SUBCUTANEOUS | Status: AC
  Filled 2020-07-10: qty 5

## 2020-07-10 MED ORDER — CELECOXIB 200 MG PO CAPS
ORAL_CAPSULE | ORAL | Status: AC
  Administered 2020-07-10: 200 mg via ORAL
  Filled 2020-07-10: qty 1

## 2020-07-10 MED ORDER — GLYCOPYRROLATE 0.2 MG/ML IJ SOLN
INTRAMUSCULAR | Status: AC
  Filled 2020-07-10: qty 1

## 2020-07-10 MED ORDER — MIDAZOLAM HCL 2 MG/2ML IJ SOLN
INTRAMUSCULAR | Status: DC | PRN
  Administered 2020-07-10 (×2): 1 mg via INTRAVENOUS

## 2020-07-10 MED ORDER — HYDROCODONE-ACETAMINOPHEN 5-325 MG PO TABS
1.0000 | ORAL_TABLET | Freq: Once | ORAL | Status: AC
  Administered 2020-07-10: 1 via ORAL

## 2020-07-10 MED ORDER — FENTANYL CITRATE (PF) 100 MCG/2ML IJ SOLN
INTRAMUSCULAR | Status: AC
  Filled 2020-07-10: qty 2

## 2020-07-10 MED ORDER — CHLORHEXIDINE GLUCONATE CLOTH 2 % EX PADS: 6.0000 | MEDICATED_PAD | Freq: Once | CUTANEOUS | Status: DC

## 2020-07-10 MED ORDER — DEXAMETHASONE SODIUM PHOSPHATE 10 MG/ML IJ SOLN
INTRAMUSCULAR | Status: AC
  Filled 2020-07-10: qty 1

## 2020-07-10 MED ORDER — GABAPENTIN 300 MG PO CAPS: 300.0000 mg | ORAL_CAPSULE | ORAL | Status: AC

## 2020-07-10 MED ORDER — ACETAMINOPHEN 500 MG PO TABS
ORAL_TABLET | ORAL | Status: AC
  Administered 2020-07-10: 1000 mg via ORAL
  Filled 2020-07-10: qty 2

## 2020-07-10 MED ORDER — CHLORHEXIDINE GLUCONATE 0.12 % MT SOLN: 15.0000 mL | Freq: Once | OROMUCOSAL | Status: AC

## 2020-07-10 MED ORDER — PROPOFOL 10 MG/ML IV BOLUS
INTRAVENOUS | Status: DC | PRN
  Administered 2020-07-10: 30 mg via INTRAVENOUS
  Administered 2020-07-10: 130 mg via INTRAVENOUS

## 2020-07-10 MED ORDER — ONDANSETRON HCL 4 MG/2ML IJ SOLN
INTRAMUSCULAR | Status: AC
  Filled 2020-07-10: qty 2

## 2020-07-10 MED ORDER — CHLORHEXIDINE GLUCONATE 0.12 % MT SOLN
OROMUCOSAL | Status: AC
  Administered 2020-07-10: 15 mL via OROMUCOSAL
  Filled 2020-07-10: qty 15

## 2020-07-10 MED ORDER — FENTANYL CITRATE (PF) 100 MCG/2ML IJ SOLN
INTRAMUSCULAR | Status: DC | PRN
  Administered 2020-07-10: 25 ug via INTRAVENOUS
  Administered 2020-07-10: 50 ug via INTRAVENOUS
  Administered 2020-07-10: 25 ug via INTRAVENOUS
  Administered 2020-07-10: 50 ug via INTRAVENOUS

## 2020-07-10 MED ORDER — BUPIVACAINE-EPINEPHRINE 0.25% -1:200000 IJ SOLN
INTRAMUSCULAR | Status: DC | PRN
  Administered 2020-07-10: 30 mL

## 2020-07-10 MED ORDER — HYDROCODONE-ACETAMINOPHEN 5-325 MG PO TABS
ORAL_TABLET | ORAL | Status: AC
  Filled 2020-07-10: qty 1

## 2020-07-10 MED ORDER — METHYLENE BLUE 0.5 % INJ SOLN
INTRAVENOUS | Status: AC
  Filled 2020-07-10: qty 10

## 2020-07-10 MED ORDER — PROPOFOL 10 MG/ML IV BOLUS
INTRAVENOUS | Status: AC
  Filled 2020-07-10: qty 20

## 2020-07-10 MED ORDER — LIDOCAINE HCL (PF) 2 % IJ SOLN
INTRAMUSCULAR | Status: AC
  Filled 2020-07-10: qty 5

## 2020-07-10 MED ORDER — SEVOFLURANE IN SOLN
RESPIRATORY_TRACT | Status: AC
  Filled 2020-07-10: qty 250

## 2020-07-10 MED ORDER — CEFAZOLIN SODIUM-DEXTROSE 2-4 GM/100ML-% IV SOLN
2.0000 g | INTRAVENOUS | Status: AC
  Administered 2020-07-10: 2 g via INTRAVENOUS

## 2020-07-10 MED ORDER — CELECOXIB 200 MG PO CAPS: 200.0000 mg | ORAL_CAPSULE | ORAL | Status: AC

## 2020-07-10 MED ORDER — LACTATED RINGERS IV SOLN: INTRAVENOUS | Status: DC

## 2020-07-10 MED ORDER — FAMOTIDINE 20 MG PO TABS
ORAL_TABLET | ORAL | Status: AC
  Administered 2020-07-10: 20 mg via ORAL
  Filled 2020-07-10: qty 1

## 2020-07-10 MED ORDER — CEFAZOLIN SODIUM-DEXTROSE 2-4 GM/100ML-% IV SOLN
INTRAVENOUS | Status: AC
  Filled 2020-07-10: qty 100

## 2020-07-10 MED ORDER — TECHNETIUM TC 99M SULFUR COLLOID FILTERED
1.0000 | Freq: Once | INTRAVENOUS | Status: AC | PRN
  Administered 2020-07-10: 0.712 via INTRADERMAL

## 2020-07-10 MED ORDER — ACETAMINOPHEN 500 MG PO TABS: 1000.0000 mg | ORAL_TABLET | ORAL | Status: AC

## 2020-07-10 MED ORDER — FAMOTIDINE 20 MG PO TABS: 20.0000 mg | ORAL_TABLET | Freq: Once | ORAL | Status: AC

## 2020-07-10 MED ORDER — BUPIVACAINE-EPINEPHRINE (PF) 0.25% -1:200000 IJ SOLN
INTRAMUSCULAR | Status: AC
  Filled 2020-07-10: qty 30

## 2020-07-10 MED ORDER — ISOSULFAN BLUE 1 % ~~LOC~~ SOLN
SUBCUTANEOUS | Status: DC | PRN
  Administered 2020-07-10: 5 mg via SUBCUTANEOUS

## 2020-07-10 MED ORDER — ORAL CARE MOUTH RINSE: 15.0000 mL | Freq: Once | OROMUCOSAL | Status: AC

## 2020-07-10 MED ORDER — MIDAZOLAM HCL 2 MG/2ML IJ SOLN
INTRAMUSCULAR | Status: AC
  Filled 2020-07-10: qty 2

## 2020-07-10 SURGICAL SUPPLY — 43 items
APPLIER CLIP 9.375 SM OPEN (CLIP)
BLADE SURG 15 STRL LF DISP TIS (BLADE) ×1 IMPLANT
BLADE SURG 15 STRL SS (BLADE) ×2
CANISTER SUCT 1200ML W/VALVE (MISCELLANEOUS) ×3 IMPLANT
CHLORAPREP W/TINT 26 (MISCELLANEOUS) ×3 IMPLANT
CLIP APPLIE 9.375 SM OPEN (CLIP) IMPLANT
CNTNR SPEC 2.5X3XGRAD LEK (MISCELLANEOUS)
CONT SPEC 4OZ STER OR WHT (MISCELLANEOUS)
CONTAINER SPEC 2.5X3XGRAD LEK (MISCELLANEOUS) IMPLANT
COVER WAND RF STERILE (DRAPES) ×3 IMPLANT
DERMABOND ADVANCED (GAUZE/BANDAGES/DRESSINGS) ×2
DERMABOND ADVANCED .7 DNX12 (GAUZE/BANDAGES/DRESSINGS) ×1 IMPLANT
DEVICE DUBIN SPECIMEN MAMMOGRA (MISCELLANEOUS) ×3 IMPLANT
DRAPE LAPAROTOMY TRNSV 106X77 (MISCELLANEOUS) ×3 IMPLANT
ELECT CAUTERY BLADE 6.4 (BLADE) ×3 IMPLANT
ELECT REM PT RETURN 9FT ADLT (ELECTROSURGICAL) ×3
ELECTRODE REM PT RTRN 9FT ADLT (ELECTROSURGICAL) ×1 IMPLANT
GLOVE BIO SURGEON STRL SZ7 (GLOVE) ×3 IMPLANT
GLOVE INDICATOR 7.5 STRL GRN (GLOVE) ×3 IMPLANT
GOWN STRL REUS W/ TWL LRG LVL3 (GOWN DISPOSABLE) ×2 IMPLANT
GOWN STRL REUS W/TWL LRG LVL3 (GOWN DISPOSABLE) ×4
KIT MARKER MARGIN INK (KITS) IMPLANT
KIT TURNOVER KIT A (KITS) ×3 IMPLANT
LABEL OR SOLS (LABEL) ×3 IMPLANT
MARGIN MAP 10MM (MISCELLANEOUS) IMPLANT
MARKER MARGIN CORRECT CLIP (MARKER) IMPLANT
NEEDLE FILTER BLUNT 18X 1/2SAF (NEEDLE) ×2
NEEDLE FILTER BLUNT 18X1 1/2 (NEEDLE) ×1 IMPLANT
NEEDLE HYPO 22GX1.5 SAFETY (NEEDLE) ×3 IMPLANT
PACK BASIN MINOR (MISCELLANEOUS) ×3 IMPLANT
SET LOCALIZER 20 PROBE US (MISCELLANEOUS) IMPLANT
SLEVE PROBE SENORX GAMMA FIND (MISCELLANEOUS) ×3 IMPLANT
SPONGE KITTNER 5P (MISCELLANEOUS) ×3 IMPLANT
SPONGE LAP 18X18 RF (DISPOSABLE) ×3 IMPLANT
SUT MNCRL 4-0 (SUTURE) ×4
SUT MNCRL 4-0 27XMFL (SUTURE) ×2
SUT SILK 2 0 SH (SUTURE) IMPLANT
SUT VIC AB 3-0 SH 27 (SUTURE) ×4
SUT VIC AB 3-0 SH 27X BRD (SUTURE) ×2 IMPLANT
SUTURE MNCRL 4-0 27XMF (SUTURE) ×2 IMPLANT
SYR 10ML LL (SYRINGE) ×3 IMPLANT
SYR 20ML LL LF (SYRINGE) ×3 IMPLANT
WATER STERILE IRR 1000ML POUR (IV SOLUTION) ×3 IMPLANT

## 2020-07-10 NOTE — Discharge Instructions (Addendum)
AMBULATORY SURGERY  °DISCHARGE INSTRUCTIONS ° ° °1) The drugs that you were given will stay in your system until tomorrow so for the next 24 hours you should not: ° °A) Drive an automobile °B) Make any legal decisions °C) Drink any alcoholic beverage ° ° °2) You may resume regular meals tomorrow.  Today it is better to start with liquids and gradually work up to solid foods. ° °You may eat anything you prefer, but it is better to start with liquids, then soup and crackers, and gradually work up to solid foods. ° ° °3) Please notify your doctor immediately if you have any unusual bleeding, trouble breathing, redness and pain at the surgery site, drainage, fever, or pain not relieved by medication. ° ° ° °4) Additional Instructions: ° ° ° ° ° ° ° °Please contact your physician with any problems or Same Day Surgery at [phone], Monday through Friday 6 am to 4 pm, or [place] [facility] [place] Main number at [phone].AMBULATORY SURGERY  °DISCHARGE INSTRUCTIONS ° ° °5) The drugs that you were given will stay in your system until tomorrow so for the next 24 hours you should not: ° °D) Drive an automobile °E) Make any legal decisions °F) Drink any alcoholic beverage ° ° °6) You may resume regular meals tomorrow.  Today it is better to start with liquids and gradually work up to solid foods. ° °You may eat anything you prefer, but it is better to start with liquids, then soup and crackers, and gradually work up to solid foods. ° ° °7) Please notify your doctor immediately if you have any unusual bleeding, trouble breathing, redness and pain at the surgery site, drainage, fever, or pain not relieved by medication. ° ° ° °8) Additional Instructions: ° ° ° ° ° ° ° °Please contact your physician with any problems or Same Day Surgery at [phone], Monday through Friday 6 am to 4 pm, or [place] [facility] [place] Main number at [phone]. °

## 2020-07-10 NOTE — Transfer of Care (Signed)
Immediate Anesthesia Transfer of Care Note  Patient: Virginia Warner  Procedure(s) Performed: Procedure(s): BREAST LUMPECTOMY,RADIO FREQ LOCALIZER,AXILLARY SENTINEL LYMPH NODE BIOPSY (Left)  Patient Location: PACU  Anesthesia Type:General  Level of Consciousness: sedated  Airway & Oxygen Therapy: Patient Spontanous Breathing and Patient connected to face mask oxygen  Post-op Assessment: Report given to RN and Post -op Vital signs reviewed and stable  Post vital signs: Reviewed and stable  Last Vitals:  Vitals:   07/10/20 1135 07/10/20 1511  BP: (!) 161/90 95/60  Pulse: (!) 101 70  Resp: 16 11  Temp: 36.6 C   SpO2: 483% 50%    Complications: No apparent anesthesia complications

## 2020-07-10 NOTE — Op Note (Signed)
Pre-operative Diagnosis: Left Breast Cancer Invasive mammary Ca    Post-operative Diagnosis: Same   Surgeon: Caroleen Hamman,  MD FACS  Anesthesia: GETA  Procedure: Left Partial mastectomy with margins, RFA localizer guided, sentinel node biopsy Tissue transfer encompassing 63 cm2 to filled the cavity.   Findings: Clip and lesion within xray specimen  Estimated Blood Loss: Minimal         Drains: None         Specimens: partial mastectomy with label ( paint), Sentinel node x2       Complications: none                 Condition: Stable  Procedure Details  The patient was seen again in the Holding Room. The benefits, complications, treatment options, and expected outcomes were discussed with the patient. The risks of bleeding, infection, recurrence of symptoms, failure to resolve symptoms, hematoma, seroma, open wound, cosmetic deformity, and the need for further surgery were discussed.  The patient was taken to Operating Room, identified as Virginia Warner and the procedure verified.  A Time Out was held and the above information confirmed. Prior to the induction of general anesthesia, antibiotic prophylaxis was administered. VTE prophylaxis was in place. Appropriate anesthesia was then administered and tolerated well. The chest was prepped with Chloraprep and draped in the sterile fashion. The patient was positioned in the supine position.  Isosulfan blue dye was injected periareolar early under aseptic conditions.  Using the hand-held probe an area of high counts was identified in the axilla, an incision was made and direction by the probe aided in dissection of a lymph node . I clipped the small lymphatic channels in the standard fashion. A total of 2 nodes that were both hot and blue were excised.   Attention was turned to the RFA localization site where an elliptical  incision was made encompassing the middle of the mark site . Dissection around the target to perform a partial  mastectomy with adequate margins was performed. This was done with electrocautery and sharp dissection. Hemostasis was with electrocautery. Deep margins was the pectoralis major. The specimen was oriented using paint in the standard fashion.  Intraoperative x-ray of the specimen showed clip and marker within the excised specimen.  I performed an advancement flap by elevating the breast tissue from the pectoralis muscle circumferentially.  The area of mobilization was 9 x 7 cm.  I then closed the defect in multiple layers with 2-0 Vicryl and 3-0 Vicryl  Once assuring that hemostasis was adequate the wound was closed with  4-0 subcuticular Monocryl sutures.  The axillary wound was closed in a similar fashion. Dermabond was used to coat the skin.  Patient was taken to the recovery room in stable condition   Specimen xray showed excellent margins and clip and lesion within specimen   Caroleen Hamman, MD, FACS

## 2020-07-10 NOTE — Anesthesia Preprocedure Evaluation (Signed)
Anesthesia Evaluation  Patient identified by MRN, date of birth, ID band Patient awake    Reviewed: Allergy & Precautions, H&P , NPO status , Patient's Chart, lab work & pertinent test results  History of Anesthesia Complications Negative for: history of anesthetic complications  Airway Mallampati: III  TM Distance: >3 FB Neck ROM: limited    Dental  (+) Poor Dentition, Missing, Upper Dentures   Pulmonary neg pulmonary ROS, Patient abstained from smoking., former smoker,    Pulmonary exam normal breath sounds clear to auscultation       Cardiovascular Exercise Tolerance: Good hypertension, (-) angina(-) Past MI and (-) DOE Normal cardiovascular exam Rhythm:regular Rate:Normal     Neuro/Psych negative neurological ROS  negative psych ROS   GI/Hepatic negative GI ROS, Neg liver ROS, neg GERD  ,  Endo/Other  negative endocrine ROS  Renal/GU negative Renal ROS  negative genitourinary   Musculoskeletal   Abdominal   Peds  Hematology negative hematology ROS (+)   Anesthesia Other Findings Past Medical History:   Hypertension                                                 Seasonal allergies                                          Past Surgical History:   ABDOMINAL HYSTERECTOMY                                        POLYPECTOMY                                                  BMI    Body Mass Index   25.79 kg/m 2      Reproductive/Obstetrics negative OB ROS                             Anesthesia Physical  Anesthesia Plan  ASA: III  Anesthesia Plan: General   Post-op Pain Management:    Induction:   PONV Risk Score and Plan:   Airway Management Planned: LMA and Oral ETT  Additional Equipment:   Intra-op Plan:   Post-operative Plan: Extubation in OR  Informed Consent: I have reviewed the patients History and Physical, chart, labs and discussed the procedure including the  risks, benefits and alternatives for the proposed anesthesia with the patient or authorized representative who has indicated his/her understanding and acceptance.     Dental Advisory Given  Plan Discussed with: Anesthesiologist, CRNA and Surgeon  Anesthesia Plan Comments:         Anesthesia Quick Evaluation

## 2020-07-10 NOTE — Anesthesia Postprocedure Evaluation (Signed)
Anesthesia Post Note  Patient: Virginia Warner  Procedure(s) Performed: BREAST LUMPECTOMY,RADIO FREQ LOCALIZER,AXILLARY SENTINEL LYMPH NODE BIOPSY (Left )  Patient location during evaluation: PACU Anesthesia Type: General Level of consciousness: awake and alert and oriented Pain management: pain level controlled Vital Signs Assessment: post-procedure vital signs reviewed and stable Respiratory status: spontaneous breathing Cardiovascular status: blood pressure returned to baseline Anesthetic complications: no   No complications documented.   Last Vitals:  Vitals:   07/10/20 1135 07/10/20 1511  BP: (!) 161/90 95/60  Pulse: (!) 101 70  Resp: 16 11  Temp: 36.6 C (!) 36 C  SpO2: 100% 99%    Last Pain:  Vitals:   07/10/20 1511  TempSrc:   PainSc: 0-No pain                 Shadara Lopez

## 2020-07-10 NOTE — Anesthesia Procedure Notes (Signed)
Procedure Name: LMA Insertion Date/Time: 07/10/2020 1:16 PM Performed by: Jonna Clark, CRNA Pre-anesthesia Checklist: Patient identified, Patient being monitored, Timeout performed, Emergency Drugs available and Suction available Patient Re-evaluated:Patient Re-evaluated prior to induction Oxygen Delivery Method: Circle system utilized Preoxygenation: Pre-oxygenation with 100% oxygen Induction Type: IV induction Ventilation: Mask ventilation without difficulty LMA: LMA inserted LMA Size: 4.0 Tube type: Oral Number of attempts: 1 Placement Confirmation: positive ETCO2 and breath sounds checked- equal and bilateral Tube secured with: Tape Dental Injury: Teeth and Oropharynx as per pre-operative assessment

## 2020-07-10 NOTE — Interval H&P Note (Signed)
History and Physical Interval Note:  07/10/2020 11:22 AM  Virginia Warner  has presented today for surgery, with the diagnosis of Breast Cancer.  The various methods of treatment have been discussed with the patient and family. After consideration of risks, benefits and other options for treatment, the patient has consented to  Procedure(s): [place] (Left) as a surgical intervention.  The patient's history has been reviewed, patient examined, no change in status, stable for surgery.  I have reviewed the patient's chart and labs.  Questions were answered to the patient's satisfaction.     [place]

## 2020-07-11 ENCOUNTER — Telehealth: Payer: Self-pay | Admitting: Surgery

## 2020-07-11 ENCOUNTER — Encounter: Payer: Self-pay | Admitting: Surgery

## 2020-07-11 NOTE — Telephone Encounter (Signed)
Patient calls, had surgery breast lumpectomy with Dr. Dahlia Byes on 07/10/20.  She states that she is having a little bit of redness at the surgical site with swelling.  She is concerned with the swelling.  She states that she has been outside working some and not sure if that has irritated the area.  Please call.  Thank you.

## 2020-07-11 NOTE — Telephone Encounter (Signed)
Called patient back, she states that she is resting in bed and she placed ice on the area and the swelling has gone down. Pt denies f, chills, drainage to site. Advised pt since she just had sx yesterday she needs to take it easy. Suggested to continue putting ice on it. Patient states that she is going to stop taking the narcotic medication. Advised pt that she can stop taking it if she needs to , and can take ibuprofen and tylenol as needed. Pt voiced understanding and has no further concerns.

## 2020-07-12 ENCOUNTER — Other Ambulatory Visit: Payer: Self-pay | Admitting: Anatomic Pathology & Clinical Pathology

## 2020-07-12 ENCOUNTER — Encounter: Payer: Self-pay | Admitting: Oncology

## 2020-07-12 ENCOUNTER — Inpatient Hospital Stay: Payer: Medicare Other | Attending: Oncology | Admitting: Oncology

## 2020-07-12 ENCOUNTER — Other Ambulatory Visit: Payer: Self-pay

## 2020-07-12 VITALS — BP 135/84 | HR 78 | Temp 98.7°F | Resp 16 | Ht 65.0 in | Wt 140.8 lb

## 2020-07-12 DIAGNOSIS — I10 Essential (primary) hypertension: Secondary | ICD-10-CM | POA: Insufficient documentation

## 2020-07-12 DIAGNOSIS — C50512 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 breast: Secondary | ICD-10-CM | POA: Diagnosis not present

## 2020-07-12 DIAGNOSIS — Z170 Estrogen receptor positive status [ER+]: Secondary | ICD-10-CM | POA: Insufficient documentation

## 2020-07-12 DIAGNOSIS — Z7189 Other specified counseling: Secondary | ICD-10-CM | POA: Diagnosis not present

## 2020-07-12 DIAGNOSIS — M810 Age-related osteoporosis without current pathological fracture: Secondary | ICD-10-CM | POA: Insufficient documentation

## 2020-07-12 DIAGNOSIS — C50912 Malignant neoplasm of unspecified site of left female breast: Secondary | ICD-10-CM | POA: Diagnosis not present

## 2020-07-12 DIAGNOSIS — Z79899 Other long term (current) drug therapy: Secondary | ICD-10-CM | POA: Diagnosis not present

## 2020-07-12 DIAGNOSIS — Z7952 Long term (current) use of systemic steroids: Secondary | ICD-10-CM | POA: Diagnosis not present

## 2020-07-12 DIAGNOSIS — Z87891 Personal history of nicotine dependence: Secondary | ICD-10-CM | POA: Insufficient documentation

## 2020-07-12 DIAGNOSIS — E785 Hyperlipidemia, unspecified: Secondary | ICD-10-CM | POA: Insufficient documentation

## 2020-07-12 LAB — SURGICAL PATHOLOGY

## 2020-07-12 NOTE — Progress Notes (Signed)
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  This was followed by diagnostic mammogram and ultrasound which showed a 7 x 7 x 4 mm mass 3 cm from the nipple at the 7 o'clock position.  Normal-appearing left axillary lymph nodes.  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  ER strongly positive greater than 90%, PR 1 to 10% positive and HER-2 negative  Patient underwent lumpectomy and sentinel lymph node biopsy on 07/10/2020  Interval history-patient feels over the site of lumpectomy but denies other complaints at this time1  ECOG PS- 1 Pain scale-3  Review of systems- Review of Systems  Constitutional: Positive for malaise/fatigue. Negative for chills, fever and weight loss.  HENT: Negative for congestion, ear discharge and nosebleeds.   Eyes: Negative for blurred vision.  Respiratory: Negative for cough, hemoptysis, sputum production, shortness of breath and wheezing.   Cardiovascular: Negative for chest pain, palpitations, orthopnea and claudication.  Gastrointestinal: Negative for abdominal pain, blood in stool,  constipation, diarrhea, heartburn, melena, nausea and vomiting.  Genitourinary: Negative for dysuria, flank pain, frequency, hematuria and urgency.  Musculoskeletal: Negative for back pain, joint pain and myalgias.  Skin: Negative for rash.  Neurological: Negative for dizziness, tingling, focal weakness, seizures, weakness and headaches.  Endo/Heme/Allergies: Does not bruise/bleed easily.  Psychiatric/Behavioral: Negative for depression and suicidal ideas. The patient does not have insomnia.        Allergies  Allergen Reactions   Aspirin Nausea And Vomiting    Cannot tolerate unless enteric coated   Atorvastatin Other (See Comments)    Bone pain     Past Medical History:  Diagnosis Date   Allergy    Breast cancer ([place])    Hyperlipidemia    Hypertension    Seasonal allergies      Past Surgical History:  Procedure Laterality Date   ABDOMINAL HYSTERECTOMY     partial   BREAST BIOPSY Left 06/12/2020   [place] Bx, Ribbon Clip, path pending    BREAST LUMPECTOMY,RADIO FREQ [place] BIOPSY Left 07/10/2020   Procedure: BREAST LUMPECTOMY,RADIO FREQ LOCALIZER,AXILLARY SENTINEL LYMPH NODE BIOPSY;  Surgeon: Jules Husbands, MD;  Location: [facility] ORS;  Service: General;  Laterality: Left;   COLONOSCOPY WITH PROPOFOL N/A 10/18/2015   Procedure: COLONOSCOPY WITH PROPOFOL;  Surgeon: Manya Silvas, MD;  Location: [facility] ENDOSCOPY;  Service: Endoscopy;  Laterality: N/A;   INGUINAL HERNIA REPAIR     age 49's   POLYPECTOMY      Social History   Socioeconomic History   Marital status: Divorced    Spouse name: Not on file   Number of children: Not on file   Years of education: Not on file   Highest education level: Not on file  Occupational History   Not on file  Tobacco Use   Smoking status: Former Smoker    Quit date: 11/2007    Years since quitting: 12.6   Smokeless tobacco: Never Used  Scientific laboratory technician Use: Never used  Substance  and Sexual Activity   Alcohol use: Not Currently    Alcohol/week: 0.0 standard drinks   Drug use: No   Sexual activity: Not Currently  Other Topics Concern   Not on file  Social History Narrative   Not on file   Social Determinants of Health   Financial Resource Strain:    Difficulty of Paying Living Expenses:   Food Insecurity:    Worried About Charity fundraiser in the Last Year:    Arboriculturist in the Last Year:   Transportation Needs:    Film/video editor (Medical):    Lack of Transportation (Non-Medical):   Physical Activity:    Days of Exercise per Week:    Minutes of Exercise per Session:   Stress:    Feeling of Stress :   Social Connections:    Frequency of Communication with Friends and Family:    Frequency of Social Gatherings with Friends and Family:    Attends Religious Services:    Active Member of Clubs or Organizations:    Attends Music therapist:    Marital Status:   Intimate Partner Violence:    Fear of Current or Ex-Partner:    Emotionally Abused:    Physically Abused:    Sexually Abused:     Family History  Problem Relation Age of Onset   Cancer Brother    Breast cancer Paternal Grandmother      Current Outpatient Medications:    amLODipine (NORVASC) 5 MG tablet, Take 1 tablet (5 mg total) by mouth daily., Disp: 30 tablet, Rfl: 5   calcium carbonate (TUMS - DOSED IN MG ELEMENTAL CALCIUM) 500 MG chewable tablet, Chew 1-2 tablets by mouth daily as needed for indigestion or heartburn., Disp: , Rfl:    fexofenadine (ALLEGRA) 180 MG tablet, Take 180 mg by mouth daily., Disp: , Rfl:    HYDROcodone-acetaminophen (NORCO/VICODIN) 5-325 MG tablet, Take 1-2 tablets by mouth every 6 (six) hours as needed for moderate pain., Disp: 20 tablet, Rfl: 0   latanoprost (XALATAN) 0.005 % ophthalmic solution, Place 1 drop into both eyes daily. , Disp: , Rfl:    lisinopril-hydrochlorothiazide (ZESTORETIC) 20-12.5 MG  tablet, Take 1 tablet by mouth 2 (two) times daily. (Patient taking differently: Take 1 tablet by mouth in the morning and at bedtime. ), Disp: 180 tablet, Rfl: 3   acetaminophen (TYLENOL) 500 MG tablet, Take 500 mg by mouth daily as needed for moderate pain. (Patient not taking: Reported on 07/12/2020), Disp: , Rfl:    alendronate (FOSAMAX) 70 MG tablet, Take 1 tablet (70 mg total) by mouth once a week. Take with a full glass of water on an empty stomach. (Patient not taking: Reported on 06/18/2020), Disp: 4 tablet, Rfl: 3   aspirin EC 81 MG tablet, Take 81 mg by mouth daily.  (Patient not taking: Reported on 07/12/2020), Disp: , Rfl:    Calcium Carbonate-Vitamin D (CALTRATE 600+D PO), Take 1 tablet by mouth daily. (Patient not taking: Reported on 07/12/2020), Disp: , Rfl:    esomeprazole (NEXIUM) 20 MG capsule, Take 1 capsule (20 mg total) by mouth daily. (Patient not taking: Reported on 06/18/2020), Disp: 90 capsule, Rfl: 1   fluticasone (FLONASE) 50  MCG/ACT nasal spray, Place 2 sprays into both nostrils daily. (Patient not taking: Reported on 07/12/2020), Disp: 16 g, Rfl: 3   methylPREDNISolone (MEDROL) 4 MG TBPK tablet, Take by mouth as directed for 6 days (Patient not taking: Reported on 06/18/2020), Disp: 21 tablet, Rfl: 0   Multiple Vitamin (MULTIVITAMIN WITH MINERALS) TABS tablet, Take 1 tablet by mouth daily. (Patient not taking: Reported on 07/12/2020), Disp: , Rfl:    traZODone (DESYREL) 50 MG tablet, Take 1/2 to 1 tablet po QHS prn anxiety/insomnia (Patient not taking: Reported on 07/12/2020), Disp: 30 tablet, Rfl: 2  Physical exam:  Vitals:   07/12/20 0948  BP: 135/84  Pulse: 78  Resp: 16  Temp: 98.7 F (37.1 C)  TempSrc: Tympanic  Weight: 140 lb 12.8 oz (63.9 kg)  Height: '5\' 5"'  (1.651 m)   Physical Exam Constitutional:      General: She is not in acute distress. Pulmonary:     Effort: Pulmonary effort is normal.  Skin:    General: Skin is warm and dry.  Neurological:      Mental Status: She is alert and oriented to person, place, and time.   Patient is s/p left lumpectomy and sentinel lymph node biopsy.  There is mild inflammation post surgery which is expected.  No evidence of Wound infection.  CMP Latest Ref Rng & Units 07/10/2020  Glucose 70 - 99 mg/dL 110(H)  BUN 8 - 23 mg/dL 9  Creatinine 0.44 - 1.00 mg/dL 0.60  Sodium 135 - 145 mmol/L 142  Potassium 3.5 - 5.1 mmol/L 3.2(L)  Chloride 98 - 111 mmol/L 101  CO2 22 - 32 mmol/L -  Calcium 8.9 - 10.3 mg/dL -  Total Protein 6.5 - 8.1 g/dL -  Total Bilirubin 0.3 - 1.2 mg/dL -  Alkaline Phos 38 - 126 U/L -  AST 15 - 41 U/L -  ALT 0 - 44 U/L -   CBC Latest Ref Rng & Units 07/10/2020  WBC 4.0 - 10.5 K/uL -  Hemoglobin 12.0 - 15.0 g/dL 15.3(H)  Hematocrit 36 - 46 % 45.0  Platelets 150 - 400 K/uL -    No images are attached to the encounter.  NM SENTINEL NODE INJECTION  Result Date: 07/10/2020 CLINICAL DATA:  Left breast cancer. EXAM: NUCLEAR MEDICINE BREAST LYMPHOSCINTIGRAPHY TECHNIQUE: Intradermal injection of radiopharmaceutical was performed at the 12 o'clock, 3 o'clock, 6 o'clock, and 9 o'clock positions around the left nipple. The patient was then sent to the operating room where the sentinel node(s) were identified and removed by the surgeon. RADIOPHARMACEUTICALS:  Total of 1 mCi Millipore-filtered Technetium-59msulfur colloid, injected in four aliquots of 0.25 mCi each. IMPRESSION: Uncomplicated intradermal injection of a total of 1 mCi Technetium-968mulfur colloid for purposes of sentinel node identification. Electronically Signed   By: GlAletta Edouard.D.   On: 07/10/2020 12:14   MM Breast Surgical Specimen  Result Date: 07/10/2020 CLINICAL DATA:  Evaluate surgical specimen following lumpectomy for LEFT breast cancer. EXAM: SPECIMEN RADIOGRAPH OF THE LEFT BREAST COMPARISON:  Previous exam(s). FINDINGS: Status post excision of the LEFT breast. The radiofrequency device and RIBBON biopsy marker clip  are present and completely intact. IMPRESSION: Specimen radiograph of the LEFT breast. Electronically Signed   By: JeMargarette Canada.D.   On: 07/10/2020 14:26   MM LT RADIO FREQUENCY TAG LOC MAMMO GUIDE  Result Date: 07/04/2020 CLINICAL DATA:  Recently diagnosed LEFT breast carcinoma scheduled for lumpectomy requiring preoperative radiofrequency localization. EXAM: MAMMOGRAPHIC GUIDED RADIOFREQUENCY DEVICE LOCALIZATION  OF THE LEFTBREAST COMPARISON:  Previous exam(s) FINDINGS: Patient presents for radiofrequency device localization prior to lumpectomy. I met with the patient and we discussed the procedure of radiofrequency device localization including benefits and alternatives. We discussed the high likelihood of a successful procedure. We discussed the risks of the procedure including infection, bleeding, tissue injury and further surgery. Informed, written consent was given. The usual time-out protocol was performed immediately prior to the procedure. Using mammographic guidance, sterile technique, 1% lidocaine as local anesthesia, a radiofrequency tag was used to localize the biopsy clip within the inner LEFT breast using a medial approach. The follow-up mammogram images confirm that the RF device is in the expected location and are marked for Dr. Dahlia Byes. The patient tolerated the procedure well and was released from the [facility]. IMPRESSION: Radiofrequency device localization of the LEFT breast. No apparent complications. Electronically Signed   By: Franki Cabot M.D.   On: 07/04/2020 16:32     Assessment and plan- Patient is a 74 y.o. female with clinical prognostic stage I acT1b cN0 cM0 ER/PR positive HER-2/neu negative left breast invasive mammary carcinoma s/p lumpectomy and sentinel lymph node biopsy  Final pathology results are not back yet.  Discussed with the patient the utility of sending Oncotype testing since her tumor was greater than or equal to 5 mm grade 2 determine if she would benefit  from adjuvant chemotherapy.  Discussed what Oncotype testing results is and how the results are interpreted.  Also discussed that at her age if patient does not wish to consider chemotherapy at all then we do not have to send Oncotype testing.  Patient does not want to take chemotherapy if that was an option and I will therefore not be sending of Oncotype testing  I will see her back on 08/09/2020 to discuss final pathology results.  She will also see Dr. Donella Stade on the same day to discuss adjuvant radiation therapy.  Discussed with the patient given that she has ER positive disease she would benefit from adjuvant endocrine therapy.She recently had a bone density scan on 05/29/2020 which showed osteoporosis with a mild T score of -2.6 at the right femur.  She is currently on calcium and vitamin D as well as about to start Fosamax.  I am inclined to start her on an aromatase inhibitor upon completion of radiation therapy and keep a close eye on her bone density scan.  Treatment will be given with a curative intent   Visit Diagnosis 1. Goals of care, counseling/discussion   2. 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 breast, unspecified estrogen receptor status ([place])      Dr. Randa Evens, MD, MPH [facility] at [facility] [phone] 07/12/2020 1:31 PM

## 2020-07-12 NOTE — Progress Notes (Signed)
Pt here to get results of surgery and plan. She is sore at surgery site

## 2020-07-13 ENCOUNTER — Telehealth (INDEPENDENT_AMBULATORY_CARE_PROVIDER_SITE_OTHER): Payer: Self-pay | Admitting: Surgery

## 2020-07-13 DIAGNOSIS — Z09 Encounter for follow-up examination after completed treatment for conditions other than malignant neoplasm: Secondary | ICD-10-CM

## 2020-07-13 NOTE — Telephone Encounter (Signed)
Called pt , updated her on path results. Negative margins and Lymph nodes are negative. She is doing very well otherwise and has no concerns.

## 2020-07-19 ENCOUNTER — Institutional Professional Consult (permissible substitution): Payer: Medicare Other | Admitting: Radiation Oncology

## 2020-07-19 ENCOUNTER — Ambulatory Visit: Payer: Medicare Other | Admitting: Oncology

## 2020-07-25 ENCOUNTER — Ambulatory Visit (INDEPENDENT_AMBULATORY_CARE_PROVIDER_SITE_OTHER): Payer: Self-pay | Admitting: Surgery

## 2020-07-25 ENCOUNTER — Encounter: Payer: Self-pay | Admitting: Surgery

## 2020-07-25 ENCOUNTER — Other Ambulatory Visit: Payer: Self-pay

## 2020-07-25 VITALS — BP 185/105 | HR 114 | Temp 98.0°F | Resp 12 | Wt 138.8 lb

## 2020-07-25 DIAGNOSIS — Z09 Encounter for follow-up examination after completed treatment for conditions other than malignant neoplasm: Secondary | ICD-10-CM

## 2020-07-25 NOTE — Patient Instructions (Addendum)
Our office will contact you around September 2021 to schedule your follow up appointment for October 2021.   Call the office if you have any questions or concerns.   Lumpectomy, Care After This sheet gives you information about how to care for yourself after your procedure. Your health care provider may also give you more specific instructions. If you have problems or questions, contact your health care provider. What can I expect after the procedure? After the procedure, it is common to have:  Breast swelling.  Breast tenderness.  Stiffness in your arm or shoulder.  A change in the shape and feel of your breast.  Scar tissue that feels hard to the touch in the area where the lump was removed. Follow these instructions at home: Medicines  Take over-the-counter and prescription medicines only as told by your health care provider.  If you were prescribed an antibiotic medicine, take it as told by your health care provider. Do not stop taking the antibiotic even if you start to feel better.  Ask your health care provider if the medicine prescribed to you: ? Requires you to avoid driving or using heavy machinery. ? Can cause constipation. You may need to take these actions to prevent or treat constipation:  Drink enough fluid to keep your urine pale yellow.  Take over-the-counter or prescription medicines.  Eat foods that are high in fiber, such as beans, whole grains, and fresh fruits and vegetables.  Limit foods that are high in fat and processed sugars, such as fried or sweet foods. Incision care      Follow instructions from your health care provider about how to take care of your incision. Make sure you: ? Wash your hands with soap and water before and after you change your bandage (dressing). If soap and water are not available, use hand sanitizer. ? Change your dressing as told by your health care provider. ? Leave stitches (sutures), skin glue, or adhesive strips in  place. These skin closures may need to stay in place for 2 weeks or longer. If adhesive strip edges start to loosen and curl up, you may trim the loose edges. Do not remove adhesive strips completely unless your health care provider tells you to do that.  Check your incision area every day for signs of infection. Check for: ? More redness, swelling, or pain. ? Fluid or blood. ? Warmth. ? Pus or a bad smell.  Keep your dressing clean and dry.  If you were sent home with a surgical drain in place, follow instructions from your health care provider about emptying it. Bathing  Do not take baths, swim, or use a hot tub until your health care provider approves.  Ask your health care provider if you may take showers. You may only be allowed to take sponge baths. Activity  Rest as told by your health care provider.  Avoid sitting for a long time without moving. Get up to take short walks every 1-2 hours. This is important to improve blood flow and breathing. Ask for help if you feel weak or unsteady.  Return to your normal activities as told by your health care provider. Ask your health care provider what activities are safe for you.  Be careful to avoid any activities that could cause an injury to your arm on the side of your surgery.  Do not lift anything that is heavier than 10 lb (4.5 kg), or the limit that you are told, until your health care provider says  that it is safe. Avoid lifting with the arm that is on the side of your surgery.  Do not carry heavy objects on your shoulder on the side of your surgery.  Do exercises to keep your shoulder and arm from getting stiff and swollen. Talk with your health care provider about which exercises are safe for you. General instructions  Wear a supportive bra as told by your health care provider.  Raise (elevate) your arm above the level of your heart while you are sitting or lying down.  Do not wear tight jewelry on your arm, wrist, or  fingers on the side of your surgery.  Keep all follow-up visits as told by your health care provider. This is important. ? You may need to be screened for extra fluid around the lymph nodes and swelling in the breast and arm (lymphedema). Follow instructions from your health care provider about how often you should be checked.  If you had any lymph nodes removed during your procedure, be sure to tell all of your health care providers. This is important information to share before you are involved in certain procedures, such as having blood tests or having your blood pressure taken. Contact a health care provider if:  You develop a rash.  You have a fever.  Your pain medicine is not working.  You have swelling, weakness, or numbness in your arm that does not improve after a few weeks.  You have new swelling in your breast.  You have any of these signs of infection: ? More redness, swelling, or pain in your incision area. ? Fluid or blood coming from your incision. ? Warmth coming from the incision area. ? Pus or a bad smell coming from your incision. Get help right away if you have:  Very bad pain in your breast or arm.  Swelling in your legs or arms.  Redness, warmth, or pain in your leg or arm.  Chest pain.  Difficulty breathing. Summary  After the procedure, it is common to have breast tenderness, swelling in your breast, and stiffness in your arm and shoulder.  Follow instructions from your health care provider about how to take care of your incision.  Do not lift anything that is heavier than 10 lb (4.5 kg), or the limit that you are told, until your health care provider says that it is safe. Avoid lifting with the arm that is on the side of your surgery.  If you had any lymph nodes removed during your procedure, be sure to tell all of your health care providers. This is important information to share before you are involved in certain procedures, such as having blood  tests or having your blood pressure taken. This information is not intended to replace advice given to you by your health care provider. Make sure you discuss any questions you have with your health care provider. Document Revised: 06/20/2019 Document Reviewed: 06/20/2019 Elsevier Patient Education  Virginia Warner.

## 2020-07-26 ENCOUNTER — Encounter: Payer: Self-pay | Admitting: Surgery

## 2020-07-26 NOTE — Progress Notes (Signed)
Virginia Warner 74 year old female status post lumpectomy on the left breast with sentinel lymph node biopsy for invasive mammary carcinoma.  ER/PR positive HER-2 negative.  Biopsy results discussed with the patient in detail.  Negative margins negative lymph nodes SHe is doing well otherwise and no complaints. Physical exam: No acute distress.  Awake and alert.  Breast incision healing well without infection.  Axillary incision with a little bit of induration with no infection no evidence of lymphedema.  A/P doing very well.  No surgical issues at this time.  We will need to complete radiation therapy as well as hormonal therapy.  We will see her back in a few months.

## 2020-08-07 ENCOUNTER — Encounter: Payer: Self-pay | Admitting: Nurse Practitioner

## 2020-08-07 ENCOUNTER — Other Ambulatory Visit: Payer: Self-pay

## 2020-08-07 ENCOUNTER — Ambulatory Visit (INDEPENDENT_AMBULATORY_CARE_PROVIDER_SITE_OTHER): Payer: Medicare Other | Admitting: Nurse Practitioner

## 2020-08-07 VITALS — BP 173/90 | HR 100 | Temp 97.5°F | Resp 16 | Ht 65.0 in | Wt 141.0 lb

## 2020-08-07 DIAGNOSIS — B9689 Other specified bacterial agents as the cause of diseases classified elsewhere: Secondary | ICD-10-CM

## 2020-08-07 DIAGNOSIS — N760 Acute vaginitis: Secondary | ICD-10-CM | POA: Diagnosis not present

## 2020-08-07 MED ORDER — METRONIDAZOLE 0.75 % VA GEL: 1.0000 | Freq: Every day | VAGINAL | 0 refills | Status: DC

## 2020-08-07 NOTE — Progress Notes (Signed)
[facility] [place], [place] [address]  Internal MEDICINE  Office Visit Note  Patient Name: Virginia Warner  [id number]  [id number]  Date of Service: 08/22/2020   Pt is here for a sick visit.  Chief Complaint  Patient presents with  . Acute Visit    kidney stones, discharge     The patient is herr for acute visit. Today, she states that she has brown colored vaginal discharge. She has had some mild vaginal itching and irritation. This has been going on for today only. She denies abdominal pain. She denies pain with urination, frequency, or urgency with urination.        Current Medication:  Outpatient Encounter Medications as of 08/07/2020  Medication Sig Note  . acetaminophen (TYLENOL) 500 MG tablet Take 500 mg by mouth daily as needed for moderate pain.    Marland Kitchen alendronate (FOSAMAX) 70 MG tablet Take 1 tablet (70 mg total) by mouth once a week. Take with a full glass of water on an empty stomach.   Marland Kitchen aspirin EC 81 MG tablet Take 81 mg by mouth daily.    . calcium carbonate (TUMS - DOSED IN MG ELEMENTAL CALCIUM) 500 MG chewable tablet Chew 1-2 tablets by mouth daily as needed for indigestion or heartburn. (Patient not taking: Reported on 08/10/2020)   . Calcium Carbonate-Vitamin D (CALTRATE 600+D PO) Take 1 tablet by mouth daily.  (Patient not taking: Reported on 08/10/2020)   . esomeprazole (NEXIUM) 20 MG capsule Take 1 capsule (20 mg total) by mouth daily. (Patient not taking: Reported on 08/10/2020)   . fexofenadine (ALLEGRA) 180 MG tablet Take 180 mg by mouth daily.   . fluticasone (FLONASE) 50 MCG/ACT nasal spray Place 2 sprays into both nostrils daily.   Marland Kitchen latanoprost (XALATAN) 0.005 % ophthalmic solution Place 1 drop into both eyes daily.  06/26/2020: Pt states she uses whenever not always at night  . lisinopril-hydrochlorothiazide (ZESTORETIC) 20-12.5 MG tablet Take 1 tablet by mouth 2 (two) times daily. (Patient taking differently: Take 1 tablet by  mouth in the morning and at bedtime. ) 08/10/2020: Pt takes only 1  a  day  . methylPREDNISolone (MEDROL) 4 MG TBPK tablet Take by mouth as directed for 6 days (Patient not taking: Reported on 08/10/2020)   . Multiple Vitamin (MULTIVITAMIN WITH MINERALS) TABS tablet Take 1 tablet by mouth daily.  06/26/2020: Pt ran out, needs to get more  . traZODone (DESYREL) 50 MG tablet Take 1/2 to 1 tablet po QHS prn anxiety/insomnia (Patient not taking: Reported on 08/10/2020) 06/26/2020: Hasnt started  . [DISCONTINUED] amLODipine (NORVASC) 5 MG tablet Take 1 tablet (5 mg total) by mouth daily.   . metroNIDAZOLE (METROGEL) 0.75 % vaginal gel Place 1 Applicatorful vaginally at bedtime. (Patient not taking: Reported on 08/10/2020) 08/10/2020: Pt never started the med   No facility-administered encounter medications on file as of 08/07/2020.      Medical History: Past Medical History:  Diagnosis Date  . Allergy   . Arthritis   . Breast cancer ([place])   . Hyperlipidemia   . Hypertension   . Seasonal allergies     Today's Vitals   08/07/20 1120  BP: (!) 173/90  Pulse: 100  Resp: 16  Temp: (!) 97.5 F (36.4 C)  SpO2: 96%  Weight: 141 lb (64 kg)  Height: 5\' 5"  (1.651 m)   Body mass index is 23.46 kg/m.  Review of Systems  Constitutional: Negative for chills, fatigue and unexpected weight  change.  HENT: Negative for congestion, postnasal drip, rhinorrhea, sneezing and sore throat.   Respiratory: Negative for cough, chest tightness, shortness of breath and wheezing.   Cardiovascular: Negative for chest pain and palpitations.       Blood pressure elevated today.  Gastrointestinal: Negative for abdominal pain, constipation, diarrhea, nausea and vomiting.  Genitourinary: Positive for vaginal discharge. Negative for dysuria, frequency and urgency.  Musculoskeletal: Negative for arthralgias, back pain, joint swelling and neck pain.  Skin: Negative for rash.  Neurological: Negative.  Negative for tremors  and numbness.  Hematological: Negative for adenopathy. Does not bruise/bleed easily.  Psychiatric/Behavioral: Negative for behavioral problems (Depression), sleep disturbance and suicidal ideas. The patient is not nervous/anxious.     Physical Exam Vitals and nursing note reviewed.  Constitutional:      General: She is not in acute distress.    Appearance: Normal appearance. She is well-developed. She is not diaphoretic.  HENT:     Head: Normocephalic and atraumatic.     Mouth/Throat:     Pharynx: No oropharyngeal exudate.  Eyes:     Pupils: Pupils are equal, round, and reactive to light.  Neck:     Thyroid: No thyromegaly.     Vascular: No JVD.     Trachea: No tracheal deviation.  Cardiovascular:     Rate and Rhythm: Normal rate and regular rhythm.     Heart sounds: Normal heart sounds. No murmur heard.  No friction rub. No gallop.   Pulmonary:     Effort: Pulmonary effort is normal. No respiratory distress.     Breath sounds: No wheezing or rales.  Chest:     Chest wall: No tenderness.  Abdominal:     General: Bowel sounds are normal.     Palpations: Abdomen is soft.  Musculoskeletal:        General: Normal range of motion.     Cervical back: Normal range of motion and neck supple.  Lymphadenopathy:     Cervical: No cervical adenopathy.  Skin:    General: Skin is warm and dry.  Neurological:     Mental Status: She is alert and oriented to person, place, and time.     Cranial Nerves: No cranial nerve deficit.  Psychiatric:        Attention and Perception: Attention and perception normal.        Mood and Affect: Affect normal. Mood is anxious.        Speech: Speech normal.        Behavior: Behavior normal.        Thought Content: Thought content normal.        Cognition and Memory: Cognition and memory normal.        Judgment: Judgment normal.   Assessment/Plan:  1. Bacterial vaginitis Start metronidazole vaginal gel. Use one applicatorful nightly for next seven  nights. Advised patient to contact the office for persistent or worsening symptoms. She voiced understanding and agreement  - metroNIDAZOLE (METROGEL) 0.75 % vaginal gel; Place 1 Applicatorful vaginally at bedtime. (Patient not taking: Reported on 08/10/2020)  Dispense: 70 g; Refill: 0   General Counseling: Saskia verbalizes understanding of the findings of todays visit and agrees with plan of treatment. I have discussed any further diagnostic evaluation that may be needed or ordered today. We also reviewed her medications today. she has been encouraged to call the office with any questions or concerns that should arise related to todays visit.    Counseling:   This patient was  seen by Leretha Pol FNP Collaboration with Dr [name] as a part of collaborative care agreement  Meds ordered this encounter  Medications  . metroNIDAZOLE (METROGEL) 0.75 % vaginal gel    Sig: Place 1 Applicatorful vaginally at bedtime.    Dispense:  70 g    Refill:  0    Order Specific Question:   Supervising Provider    Answer:   [name] [[id number]]    Time spent: 25 Minutes

## 2020-08-10 ENCOUNTER — Inpatient Hospital Stay: Payer: Medicare Other | Attending: Oncology | Admitting: Oncology

## 2020-08-10 ENCOUNTER — Ambulatory Visit
Admission: RE | Admit: 2020-08-10 | Discharge: 2020-08-10 | Disposition: A | Discharge status: Home / Self Care | Payer: Medicare Other | Source: Ambulatory Visit | Attending: Radiation Oncology | Admitting: Radiation Oncology

## 2020-08-10 ENCOUNTER — Ambulatory Visit: Payer: Medicare Other | Admitting: Oncology

## 2020-08-10 ENCOUNTER — Other Ambulatory Visit: Payer: Self-pay

## 2020-08-10 ENCOUNTER — Encounter: Payer: Self-pay | Admitting: Oncology

## 2020-08-10 VITALS — BP 136/79 | HR 103 | Temp 98.1°F | Resp 16 | Ht 65.0 in | Wt 141.4 lb

## 2020-08-10 DIAGNOSIS — Z79899 Other long term (current) drug therapy: Secondary | ICD-10-CM | POA: Insufficient documentation

## 2020-08-10 DIAGNOSIS — M129 Arthropathy, unspecified: Secondary | ICD-10-CM | POA: Insufficient documentation

## 2020-08-10 DIAGNOSIS — Z809 Family history of malignant neoplasm, unspecified: Secondary | ICD-10-CM | POA: Insufficient documentation

## 2020-08-10 DIAGNOSIS — C50512 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 breast: Secondary | ICD-10-CM | POA: Diagnosis not present

## 2020-08-10 DIAGNOSIS — M810 Age-related osteoporosis without current pathological fracture: Secondary | ICD-10-CM | POA: Diagnosis not present

## 2020-08-10 DIAGNOSIS — Z7982 Long term (current) use of aspirin: Secondary | ICD-10-CM | POA: Insufficient documentation

## 2020-08-10 DIAGNOSIS — Z780 Asymptomatic menopausal state: Secondary | ICD-10-CM | POA: Diagnosis not present

## 2020-08-10 DIAGNOSIS — Z87891 Personal history of nicotine dependence: Secondary | ICD-10-CM | POA: Insufficient documentation

## 2020-08-10 DIAGNOSIS — I10 Essential (primary) hypertension: Secondary | ICD-10-CM | POA: Insufficient documentation

## 2020-08-10 DIAGNOSIS — Z7189 Other specified counseling: Secondary | ICD-10-CM

## 2020-08-10 DIAGNOSIS — Z803 Family history of malignant neoplasm of breast: Secondary | ICD-10-CM | POA: Insufficient documentation

## 2020-08-10 DIAGNOSIS — E785 Hyperlipidemia, unspecified: Secondary | ICD-10-CM | POA: Insufficient documentation

## 2020-08-10 DIAGNOSIS — Z90710 Acquired absence of both cervix and uterus: Secondary | ICD-10-CM | POA: Diagnosis not present

## 2020-08-10 DIAGNOSIS — Z170 Estrogen receptor positive status [ER+]: Secondary | ICD-10-CM | POA: Diagnosis not present

## 2020-08-10 MED ORDER — LETROZOLE 2.5 MG PO TABS
2.5000 mg | ORAL_TABLET | Freq: Every day | ORAL | 1 refills | Status: DC
Start: 2020-08-10 — End: 2020-10-30

## 2020-08-10 NOTE — Progress Notes (Signed)
Pt here for breast cancer. Pt taking fosamax but feels that once she started that she did not need to take her calcium and vit d. She is eating very good, drinking fluids. Eating a lot of ice cream. She has reg. BM. Has some arthritis pain today in right leg/hip. She wakes up a lot during night but has med if she needs it

## 2020-08-10 NOTE — Progress Notes (Signed)
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 This was followed by diagnostic mammogram and ultrasound which showed a 7 x 7 x 4 mm mass 3 cm from the nipple at the 7 o'clock position. Normal-appearing left axillary lymph nodes. 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  ER strongly positive greater than 90%, PR 1 to 10% positive and HER-2 negative  Patient underwent lumpectomy and sentinel lymph node biopsy on 07/10/2020.  Final pathology showed 8 mm grade 1 invasive mucinous carcinoma with negative margins.  Sentinel lymph nodes were negative for malignancy.  Interval history-patient is currently doing well post surgery and denies any complaints at this time  ECOG PS- 1 Pain scale- 0   Review of systems- Review of Systems  Constitutional: Positive for malaise/fatigue. Negative for chills, fever and weight loss.  HENT: Negative for congestion, ear discharge and nosebleeds.   Eyes: Negative for blurred vision.  Respiratory: Negative for cough, hemoptysis, sputum production, shortness of breath and wheezing.     Cardiovascular: Negative for chest pain, palpitations, orthopnea and claudication.  Gastrointestinal: Negative for abdominal pain, blood in stool, constipation, diarrhea, heartburn, melena, nausea and vomiting.  Genitourinary: Negative for dysuria, flank pain, frequency, hematuria and urgency.  Musculoskeletal: Negative for back pain, joint pain and myalgias.  Skin: Negative for rash.  Neurological: Negative for dizziness, tingling, focal weakness, seizures, weakness and headaches.  Endo/Heme/Allergies: Does not bruise/bleed easily.  Psychiatric/Behavioral: Negative for depression and suicidal ideas. The patient does not have insomnia.       Allergies  Allergen Reactions  . Aspirin Nausea And Vomiting    Cannot tolerate unless enteric coated  . Atorvastatin Other (See Comments)    Bone pain     Past Medical History:  Diagnosis Date  . Allergy   . Arthritis   . Breast cancer ([place])   . Hyperlipidemia   . Hypertension   . Seasonal allergies      Past Surgical History:  Procedure Laterality Date  . ABDOMINAL HYSTERECTOMY     partial  . BREAST BIOPSY Left 06/12/2020   [place] Bx, Ribbon Clip, path pending   . BREAST LUMPECTOMY,RADIO FREQ LOCALIZER,AXILLARY SENTINEL LYMPH NODE BIOPSY Left 07/10/2020   Procedure: BREAST LUMPECTOMY,RADIO FREQ LOCALIZER,AXILLARY SENTINEL LYMPH NODE BIOPSY;  Surgeon: Jules Husbands, MD;  Location: [facility] ORS;  Service: General;  Laterality: Left;  . COLONOSCOPY WITH PROPOFOL N/A 10/18/2015   Procedure: COLONOSCOPY WITH PROPOFOL;  Surgeon: Manya Silvas, MD;  Location: [facility] ENDOSCOPY;  Service: Endoscopy;  Laterality: N/A;  . INGUINAL HERNIA REPAIR     age 30's  . POLYPECTOMY      Social History   Socioeconomic History  . Marital status: Divorced    Spouse name: Not on file  .  Number of children: Not on file  . Years of education: Not on file  . Highest education level: Not on file  Occupational History  . Not on file  Tobacco Use  .  Smoking status: Former Smoker    Quit date: 11/2007    Years since quitting: 12.7  . Smokeless tobacco: Never Used  Vaping Use  . Vaping Use: Never used  Substance and Sexual Activity  . Alcohol use: Not Currently    Alcohol/week: 0.0 standard drinks  . Drug use: No  . Sexual activity: Not Currently  Other Topics Concern  . Not on file  Social History Narrative  . Not on file   Social Determinants of Health   Financial Resource Strain:   . Difficulty of Paying Living Expenses:   Food Insecurity:   . Worried About Charity fundraiser in the Last Year:   . Arboriculturist in the Last Year:   Transportation Needs:   . Film/video editor (Medical):   Marland Kitchen Lack of Transportation (Non-Medical):   Physical Activity:   . Days of Exercise per Week:   . Minutes of Exercise per Session:   Stress:   . Feeling of Stress :   Social Connections:   . Frequency of Communication with Friends and Family:   . Frequency of Social Gatherings with Friends and Family:   . Attends Religious Services:   . Active Member of Clubs or Organizations:   . Attends Archivist Meetings:   Marland Kitchen Marital Status:   Intimate Partner Violence:   . Fear of Current or Ex-Partner:   . Emotionally Abused:   Marland Kitchen Physically Abused:   . Sexually Abused:     Family History  Problem Relation Age of Onset  . Cancer Brother   . Breast cancer Paternal Grandmother      Current Outpatient Medications:  .  acetaminophen (TYLENOL) 500 MG tablet, Take 500 mg by mouth daily as needed for moderate pain. , Disp: , Rfl:  .  alendronate (FOSAMAX) 70 MG tablet, Take 1 tablet (70 mg total) by mouth once a week. Take with a full glass of water on an empty stomach., Disp: 4 tablet, Rfl: 3 .  amLODipine (NORVASC) 5 MG tablet, Take 1 tablet (5 mg total) by mouth daily., Disp: 30 tablet, Rfl: 5 .  aspirin EC 81 MG tablet, Take 81 mg by mouth daily. , Disp: , Rfl:  .  fexofenadine (ALLEGRA) 180 MG tablet, Take 180 mg by  mouth daily., Disp: , Rfl:  .  fluticasone (FLONASE) 50 MCG/ACT nasal spray, Place 2 sprays into both nostrils daily., Disp: 16 g, Rfl: 3 .  latanoprost (XALATAN) 0.005 % ophthalmic solution, Place 1 drop into both eyes daily. , Disp: , Rfl:  .  lisinopril-hydrochlorothiazide (ZESTORETIC) 20-12.5 MG tablet, Take 1 tablet by mouth 2 (two) times daily. (Patient taking differently: Take 1 tablet by mouth in the morning and at bedtime. ), Disp: 180 tablet, Rfl: 3 .  Multiple Vitamin (MULTIVITAMIN WITH MINERALS) TABS tablet, Take 1 tablet by mouth daily. , Disp: , Rfl:  .  calcium carbonate (TUMS - DOSED IN MG ELEMENTAL CALCIUM) 500 MG chewable tablet, Chew 1-2 tablets by mouth daily as needed for indigestion or heartburn. (Patient not taking: Reported on 08/10/2020), Disp: , Rfl:  .  Calcium Carbonate-Vitamin D (CALTRATE 600+D PO), Take 1 tablet by mouth daily.  (Patient not taking: Reported on 08/10/2020), Disp: , Rfl:  .  esomeprazole ([place]) 20  MG capsule, Take 1 capsule (20 mg total) by mouth daily. (Patient not taking: Reported on 08/10/2020), Disp: 90 capsule, Rfl: 1 .  letrozole (FEMARA) 2.5 MG tablet, Take 1 tablet (2.5 mg total) by mouth daily. Do not start med until you have completed radiation with 3-4 days break after radiation then start, Disp: 30 tablet, Rfl: 1 .  methylPREDNISolone (MEDROL) 4 MG TBPK tablet, Take by mouth as directed for 6 days (Patient not taking: Reported on 08/10/2020), Disp: 21 tablet, Rfl: 0 .  metroNIDAZOLE (METROGEL) 0.75 % vaginal gel, Place 1 Applicatorful vaginally at bedtime. (Patient not taking: Reported on 08/10/2020), Disp: 70 g, Rfl: 0 .  traZODone (DESYREL) 50 MG tablet, Take 1/2 to 1 tablet po QHS prn anxiety/insomnia (Patient not taking: Reported on 08/10/2020), Disp: 30 tablet, Rfl: 2  Physical exam:  Vitals:   08/10/20 0947  BP: 136/79  Pulse: (!) 103  Resp: 16  Temp: 98.1 F (36.7 C)  TempSrc: Oral  Weight: 141 lb 6.4 oz (64.1 kg)  Height: _0   (1.651 m)   Physical Exam Constitutional:      General: She is not in acute distress. Cardiovascular:     Rate and Rhythm: Normal rate and regular rhythm.     Heart sounds: Normal heart sounds.  Pulmonary:     Effort: Pulmonary effort is normal.     Breath sounds: Normal breath sounds.  Abdominal:     General: Bowel sounds are normal.     Palpations: Abdomen is soft.  Skin:    General: Skin is warm and dry.  Neurological:     Mental Status: She is alert and oriented to person, place, and time.    Breast exam was performed in seated and lying down position. Patient is status post left lumpectomy with a well-healed surgical scar. No evidence of any palpable masses. No evidence of axillary adenopathy. No evidence of any palpable masses or lumps in the right breast. No evidence of right axillary adenopathy   CMP Latest Ref Rng & Units 07/10/2020  Glucose 70 - 99 mg/dL 110(H)  BUN 8 - 23 mg/dL 9  Creatinine 0.44 - 1.00 mg/dL 0.60  Sodium 135 - 145 mmol/L 142  Potassium 3.5 - 5.1 mmol/L 3.2(L)  Chloride 98 - 111 mmol/L 101  CO2 22 - 32 mmol/L -  Calcium 8.9 - 10.3 mg/dL -  Total Protein 6.5 - 8.1 g/dL -  Total Bilirubin 0.3 - 1.2 mg/dL -  Alkaline Phos 38 - 126 U/L -  AST 15 - 41 U/L -  ALT 0 - 44 U/L -   CBC Latest Ref Rng & Units 07/10/2020  WBC 4.0 - 10.5 K/uL -  Hemoglobin 12.0 - 15.0 g/dL 15.3(H)  Hematocrit 36 - 46 % 45.0  Platelets 150 - 400 K/uL -     Assessment and plan- Patient is a 74 y.o. 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  She is here to discuss final pathology results and further management  Final pathology shows 8 mm grade 1 tumor with negative margins.  She does not require Oncotype testing for this.  She does not benefit from adjuvant chemotherapy.  She is seeing Dr. Donella Stade today to discuss adjuvant radiation  treatment  Given that her tumor was ER PR positive hormone therapy is indicated at this time.  Idiscussed the role for hormone therapy. Given that  she is postmenopausal I would favor 5 years of adjuvant hormone therapy with aromatase inhibitor. I discussed the risks and benefits of letrozole 2.5 mg daily including all but not limited to fatigue, hypercholesterolemia, hot flashes, arthralgias and worsening bone health.  Patient will also need to be on calcium 1200 mg along with vitamin D 800 international units.  Baseline bone density scan did show osteoporosis and patient is already on Fosamax and I have encouraged her to take calcium and vitamin D along with it.  Patient verbalized understanding and agrees to proceed  I will tentatively see her back in 3-1/2 months to see how she is tolerating her letrozole which she will start taking after radiation treatment.  Treatment will be given with a curative intent  Cancer Staging 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 breast [facility]) Staging form: Breast, AJCC 8th Edition - Pathologic stage from 08/10/2020: Stage IA (pT1b, pN0, cM0, G1, ER+, PR+, HER2-) - Signed by Sindy Guadeloupe, MD on 08/10/2020     Visit Diagnosis 1. 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 breast, unspecified estrogen receptor status ([place])   2. Goals of care, counseling/discussion      Dr. Randa Evens, MD, MPH [facility] at [facility] [place] [phone] 08/10/2020 1:02 PM

## 2020-08-10 NOTE — Consult Note (Signed)
NEW PATIENT EVALUATION  Name: Virginia Warner  MRN: [id number]  Date:   08/10/2020     DOB: 11-22-1946   This 74 y.o. 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  REFERRING PHYSICIAN: Ronnell Freshwater, NP  CHIEF COMPLAINT:  Chief Complaint  Patient presents with  . Breast Cancer    Initial consultation    DIAGNOSIS: The encounter diagnosis was Malignant neoplasm of lower-outer quadrant of left breast of female, estrogen receptor positive ([place]).   PREVIOUS INVESTIGATIONS:  Pathology report reviewed Mammogram and ultrasound reviewed Clinical notes reviewed  HPI: Patient is a 74 year old female who was found on routine screening mammogram showing a suspicious mass in the left breast at the 7 o'clock position with no evidence of axillary lymphadenopathy.  She underwent ultrasound-guided biopsy showing invasive mammary carcinoma with mucinous and micropapillary features.  She went on to have a wide local excision and sentinel node biopsy.  Tumor was 8 mm mucinous carcinoma overall grade 1.  Margins were clear at 5 mm.  Tumor was ER/PR positive HER-2/neu not overexpressed.  3 sentinel lymph nodes were examined all negative for malignancy.  She is not a candidate based on the size of her lesion and overall grade for systemic chemotherapy which has been reviewed by medical oncology.  She is seen today for radiation oncology opinion she is doing well she specifically denies breast tenderness cough or bone pain.  PLANNED TREATMENT REGIMEN: Left hypofractionated whole breast radiation  PAST MEDICAL HISTORY:  has a past medical history of Allergy, Arthritis, Breast cancer ([place]), Hyperlipidemia, Hypertension, and Seasonal allergies.    PAST SURGICAL HISTORY:  Past Surgical History:  Procedure Laterality Date  . ABDOMINAL  HYSTERECTOMY     partial  . BREAST BIOPSY Left 06/12/2020   [place] Bx, Ribbon Clip, path pending   . BREAST LUMPECTOMY,RADIO FREQ LOCALIZER,AXILLARY SENTINEL LYMPH NODE BIOPSY Left 07/10/2020   Procedure: BREAST LUMPECTOMY,RADIO FREQ LOCALIZER,AXILLARY SENTINEL LYMPH NODE BIOPSY;  Surgeon: Jules Husbands, MD;  Location: [facility] ORS;  Service: General;  Laterality: Left;  . COLONOSCOPY WITH PROPOFOL N/A 10/18/2015   Procedure: COLONOSCOPY WITH PROPOFOL;  Surgeon: Manya Silvas, MD;  [facility];  Service: Endoscopy;  Laterality: N/A;  . INGUINAL HERNIA REPAIR     age 23's  . POLYPECTOMY      FAMILY HISTORY: family history includes Breast cancer in her paternal grandmother; Cancer in her brother.  SOCIAL HISTORY:  reports that she quit smoking about 12 years ago. She has never used smokeless tobacco. She reports previous alcohol use. She reports that she does not use drugs.  ALLERGIES: Aspirin and Atorvastatin  MEDICATIONS:  Current Outpatient Medications  Medication Sig Dispense Refill  . acetaminophen (TYLENOL) 500 MG tablet Take 500 mg by mouth daily as needed for moderate pain.     Marland Kitchen alendronate (FOSAMAX) 70 MG tablet Take 1 tablet (70 mg total) by mouth once a week. Take with a full glass of water on an empty stomach. 4 tablet 3  . amLODipine (NORVASC) 5 MG tablet Take 1 tablet (5 mg total) by mouth daily. 30 tablet 5  . aspirin EC 81 MG tablet Take 81 mg by mouth daily.     . calcium carbonate (TUMS - DOSED IN MG ELEMENTAL CALCIUM) 500 MG chewable tablet Chew 1-2 tablets by mouth daily as needed for  indigestion or heartburn. (Patient not taking: Reported on 08/10/2020)    . Calcium Carbonate-Vitamin D (CALTRATE 600+D PO) Take 1 tablet by mouth daily.  (Patient not taking: Reported on 08/10/2020)    . esomeprazole (NEXIUM) 20 MG capsule Take 1 capsule (20 mg total) by mouth daily. (Patient not taking: Reported on 08/10/2020) 90 capsule 1  . fexofenadine (ALLEGRA) 180 MG tablet  Take 180 mg by mouth daily.    . fluticasone (FLONASE) 50 MCG/ACT nasal spray Place 2 sprays into both nostrils daily. 16 g 3  . latanoprost (XALATAN) 0.005 % ophthalmic solution Place 1 drop into both eyes daily.     Marland Kitchen letrozole (FEMARA) 2.5 MG tablet Take 1 tablet (2.5 mg total) by mouth daily. Do not start med until you have completed radiation with 3-4 days break after radiation then start 30 tablet 1  . lisinopril-hydrochlorothiazide (ZESTORETIC) 20-12.5 MG tablet Take 1 tablet by mouth 2 (two) times daily. (Patient taking differently: Take 1 tablet by mouth in the morning and at bedtime. ) 180 tablet 3  . methylPREDNISolone (MEDROL) 4 MG TBPK tablet Take by mouth as directed for 6 days (Patient not taking: Reported on 08/10/2020) 21 tablet 0  . metroNIDAZOLE (METROGEL) 0.75 % vaginal gel Place 1 Applicatorful vaginally at bedtime. (Patient not taking: Reported on 08/10/2020) 70 g 0  . Multiple Vitamin (MULTIVITAMIN WITH MINERALS) TABS tablet Take 1 tablet by mouth daily.     . traZODone (DESYREL) 50 MG tablet Take 1/2 to 1 tablet po QHS prn anxiety/insomnia (Patient not taking: Reported on 08/10/2020) 30 tablet 2   No current facility-administered medications for this encounter.    ECOG PERFORMANCE STATUS:  0 - Asymptomatic  REVIEW OF SYSTEMS: Patient denies any weight loss, fatigue, weakness, fever, chills or night sweats. Patient denies any loss of vision, blurred vision. Patient denies any ringing  of the ears or hearing loss. No irregular heartbeat. Patient denies heart murmur or history of fainting. Patient denies any chest pain or pain radiating to her upper extremities. Patient denies any shortness of breath, difficulty breathing at night, cough or hemoptysis. Patient denies any swelling in the lower legs. Patient denies any nausea vomiting, vomiting of blood, or coffee ground material in the vomitus. Patient denies any stomach pain. Patient states has had normal bowel movements no  significant constipation or diarrhea. Patient denies any dysuria, hematuria or significant nocturia. Patient denies any problems walking, swelling in the joints or loss of balance. Patient denies any skin changes, loss of hair or loss of weight. Patient denies any excessive worrying or anxiety or significant depression. Patient denies any problems with insomnia. Patient denies excessive thirst, polyuria, polydipsia. Patient denies any swollen glands, patient denies easy bruising or easy bleeding. Patient denies any recent infections, allergies or URI. Patient "s visual fields have not changed significantly in recent time.   PHYSICAL EXAM: There were no vitals taken for this visit. She is status post wide local excision scar which is healing well there are some crusting around the incision although no evidence of infection.  No other dominant mass or nodularity is noted in either breast in 2 positions examined no axillary or supraclavicular adenopathy is appreciated.  Well-developed well-nourished patient in NAD. HEENT reveals PERLA, EOMI, discs not visualized.  Oral cavity is clear. No oral mucosal lesions are identified. Neck is clear without evidence of cervical or supraclavicular adenopathy. Lungs are clear to A&P. Cardiac examination is essentially unremarkable with regular rate and rhythm without murmur rub  or thrill. Abdomen is benign with no organomegaly or masses noted. Motor sensory and DTR levels are equal and symmetric in the upper and lower extremities. Cranial nerves II through XII are grossly intact. Proprioception is intact. No peripheral adenopathy or edema is identified. No motor or sensory levels are noted. Crude visual fields are within normal range.  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  Also boost her scar another 1000 cGy using electron beam.  Risks and benefits of treatment occluding skin reaction fatigue alteration of blood counts possible inclusion of superficial lung all were described in detail.  I have personally set up and ordered CT simulation for next week.  Patient also will benefit from antiestrogen therapy after completion of radiation.  Patient comprehends my recommendations well.  I would like to take this opportunity to thank you for allowing me to participate in the care of your patient.Noreene Filbert, MD

## 2020-08-14 ENCOUNTER — Ambulatory Visit
Admission: RE | Admit: 2020-08-14 | Discharge: 2020-08-14 | Disposition: A | Discharge status: Home / Self Care | Payer: Medicare Other | Source: Ambulatory Visit | Attending: Radiation Oncology | Admitting: Radiation Oncology

## 2020-08-14 DIAGNOSIS — Z510 Encounter for antineoplastic radiation therapy: Secondary | ICD-10-CM | POA: Insufficient documentation

## 2020-08-14 DIAGNOSIS — C50512 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 breast: Secondary | ICD-10-CM | POA: Insufficient documentation

## 2020-08-14 DIAGNOSIS — Z170 Estrogen receptor positive status [ER+]: Secondary | ICD-10-CM | POA: Diagnosis not present

## 2020-08-15 ENCOUNTER — Other Ambulatory Visit: Payer: Self-pay

## 2020-08-15 ENCOUNTER — Other Ambulatory Visit: Payer: Self-pay | Admitting: *Deleted

## 2020-08-15 DIAGNOSIS — Z510 Encounter for antineoplastic radiation therapy: Secondary | ICD-10-CM | POA: Diagnosis not present

## 2020-08-15 DIAGNOSIS — Z170 Estrogen receptor positive status [ER+]: Secondary | ICD-10-CM | POA: Diagnosis not present

## 2020-08-15 DIAGNOSIS — C50512 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 breast: Secondary | ICD-10-CM | POA: Diagnosis not present

## 2020-08-15 DIAGNOSIS — I10 Essential (primary) hypertension: Secondary | ICD-10-CM

## 2020-08-15 MED ORDER — AMLODIPINE BESYLATE 5 MG PO TABS: 5.0000 mg | ORAL_TABLET | Freq: Every day | ORAL | 5 refills | Status: DC

## 2020-08-21 ENCOUNTER — Ambulatory Visit: Admission: RE | Admit: 2020-08-21 | Payer: Medicare Other | Source: Ambulatory Visit

## 2020-08-21 DIAGNOSIS — C50512 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 breast: Secondary | ICD-10-CM | POA: Diagnosis not present

## 2020-08-21 DIAGNOSIS — Z510 Encounter for antineoplastic radiation therapy: Secondary | ICD-10-CM | POA: Diagnosis not present

## 2020-08-21 DIAGNOSIS — Z170 Estrogen receptor positive status [ER+]: Secondary | ICD-10-CM | POA: Diagnosis not present

## 2020-08-22 ENCOUNTER — Ambulatory Visit
Admission: RE | Admit: 2020-08-22 | Discharge: 2020-08-22 | Disposition: A | Discharge status: Home / Self Care | Payer: Medicare Other | Source: Ambulatory Visit | Attending: Radiation Oncology | Admitting: Radiation Oncology

## 2020-08-22 DIAGNOSIS — N760 Acute vaginitis: Secondary | ICD-10-CM | POA: Insufficient documentation

## 2020-08-22 DIAGNOSIS — Z170 Estrogen receptor positive status [ER+]: Secondary | ICD-10-CM | POA: Diagnosis not present

## 2020-08-22 DIAGNOSIS — Z510 Encounter for antineoplastic radiation therapy: Secondary | ICD-10-CM | POA: Diagnosis not present

## 2020-08-22 DIAGNOSIS — C50512 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 breast: Secondary | ICD-10-CM | POA: Diagnosis not present

## 2020-08-23 ENCOUNTER — Ambulatory Visit
Admission: RE | Admit: 2020-08-23 | Discharge: 2020-08-23 | Disposition: A | Discharge status: Home / Self Care | Payer: Medicare Other | Source: Ambulatory Visit | Attending: Radiation Oncology | Admitting: Radiation Oncology

## 2020-08-23 DIAGNOSIS — Z510 Encounter for antineoplastic radiation therapy: Secondary | ICD-10-CM | POA: Diagnosis not present

## 2020-08-23 DIAGNOSIS — Z170 Estrogen receptor positive status [ER+]: Secondary | ICD-10-CM | POA: Diagnosis not present

## 2020-08-23 DIAGNOSIS — C50512 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 breast: Secondary | ICD-10-CM | POA: Diagnosis not present

## 2020-08-24 ENCOUNTER — Ambulatory Visit
Admission: RE | Admit: 2020-08-24 | Discharge: 2020-08-24 | Disposition: A | Discharge status: Home / Self Care | Payer: Medicare Other | Source: Ambulatory Visit | Attending: Radiation Oncology | Admitting: Radiation Oncology

## 2020-08-24 DIAGNOSIS — Z170 Estrogen receptor positive status [ER+]: Secondary | ICD-10-CM | POA: Diagnosis not present

## 2020-08-24 DIAGNOSIS — C50512 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 breast: Secondary | ICD-10-CM | POA: Diagnosis not present

## 2020-08-24 DIAGNOSIS — Z510 Encounter for antineoplastic radiation therapy: Secondary | ICD-10-CM | POA: Diagnosis not present

## 2020-08-27 ENCOUNTER — Ambulatory Visit
Admission: RE | Admit: 2020-08-27 | Discharge: 2020-08-27 | Disposition: A | Discharge status: Home / Self Care | Payer: Medicare Other | Source: Ambulatory Visit | Attending: Radiation Oncology | Admitting: Radiation Oncology

## 2020-08-27 DIAGNOSIS — Z510 Encounter for antineoplastic radiation therapy: Secondary | ICD-10-CM | POA: Diagnosis not present

## 2020-08-27 DIAGNOSIS — C50512 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 breast: Secondary | ICD-10-CM | POA: Diagnosis not present

## 2020-08-27 DIAGNOSIS — Z170 Estrogen receptor positive status [ER+]: Secondary | ICD-10-CM | POA: Diagnosis not present

## 2020-08-28 ENCOUNTER — Ambulatory Visit
Admission: RE | Admit: 2020-08-28 | Discharge: 2020-08-28 | Disposition: A | Discharge status: Home / Self Care | Payer: Medicare Other | Source: Ambulatory Visit | Attending: Radiation Oncology | Admitting: Radiation Oncology

## 2020-08-28 DIAGNOSIS — Z510 Encounter for antineoplastic radiation therapy: Secondary | ICD-10-CM | POA: Diagnosis not present

## 2020-08-28 DIAGNOSIS — C50512 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 breast: Secondary | ICD-10-CM | POA: Diagnosis not present

## 2020-08-28 DIAGNOSIS — Z170 Estrogen receptor positive status [ER+]: Secondary | ICD-10-CM | POA: Diagnosis not present

## 2020-08-29 ENCOUNTER — Ambulatory Visit
Admission: RE | Admit: 2020-08-29 | Discharge: 2020-08-29 | Disposition: A | Discharge status: Home / Self Care | Payer: Medicare Other | Source: Ambulatory Visit | Attending: Radiation Oncology | Admitting: Radiation Oncology

## 2020-08-29 ENCOUNTER — Telehealth: Payer: Self-pay | Admitting: Surgery

## 2020-08-29 DIAGNOSIS — Z170 Estrogen receptor positive status [ER+]: Secondary | ICD-10-CM | POA: Diagnosis not present

## 2020-08-29 DIAGNOSIS — C50512 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 breast: Secondary | ICD-10-CM | POA: Insufficient documentation

## 2020-08-29 DIAGNOSIS — Z510 Encounter for antineoplastic radiation therapy: Secondary | ICD-10-CM | POA: Insufficient documentation

## 2020-08-29 IMAGING — MG MM PLC BREAST LOC DEV 1ST LESION INC MAMMO GUIDE*L*
8 of 10 series · 8 of 10 positions shown · non-contrast
Comparison: Previous exam(s)

CLINICAL DATA: Recently diagnosed LEFT breast carcinoma scheduled
for lumpectomy requiring preoperative radiofrequency localization.

EXAM:
MAMMOGRAPHIC GUIDED RADIOFREQUENCY DEVICE LOCALIZATION OF THE
LEFTBREAST

[L ML (1 of 4)]
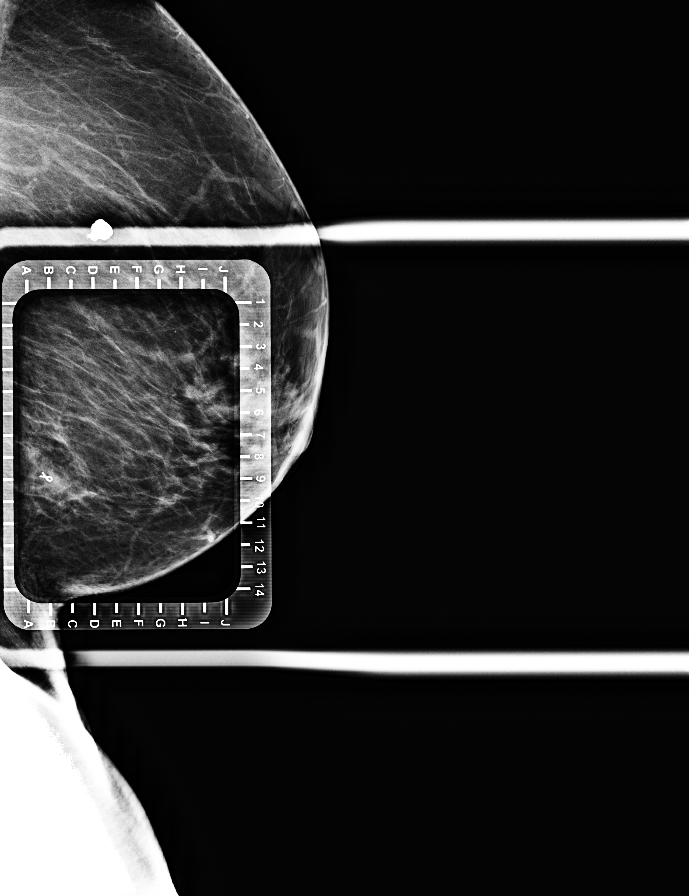

[L CC (1 of 4)]
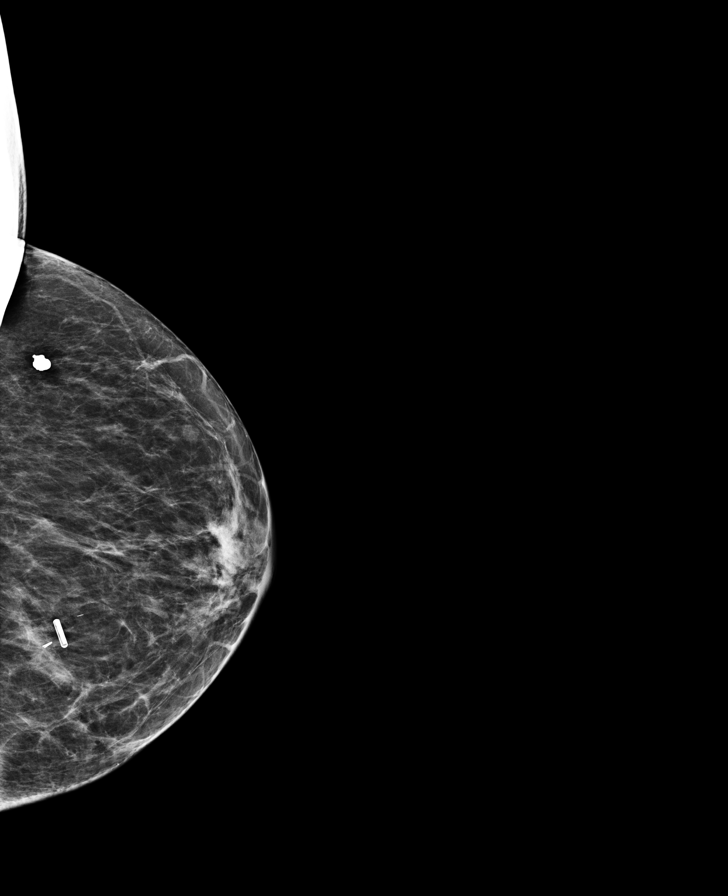

[L CC (2 of 4)]
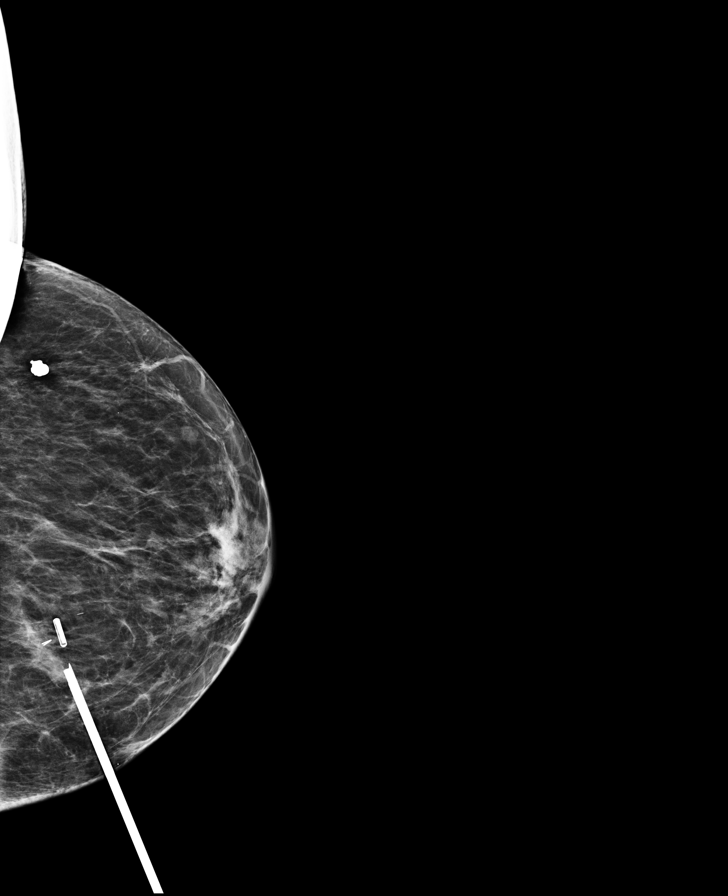

[L ML (2 of 4)]
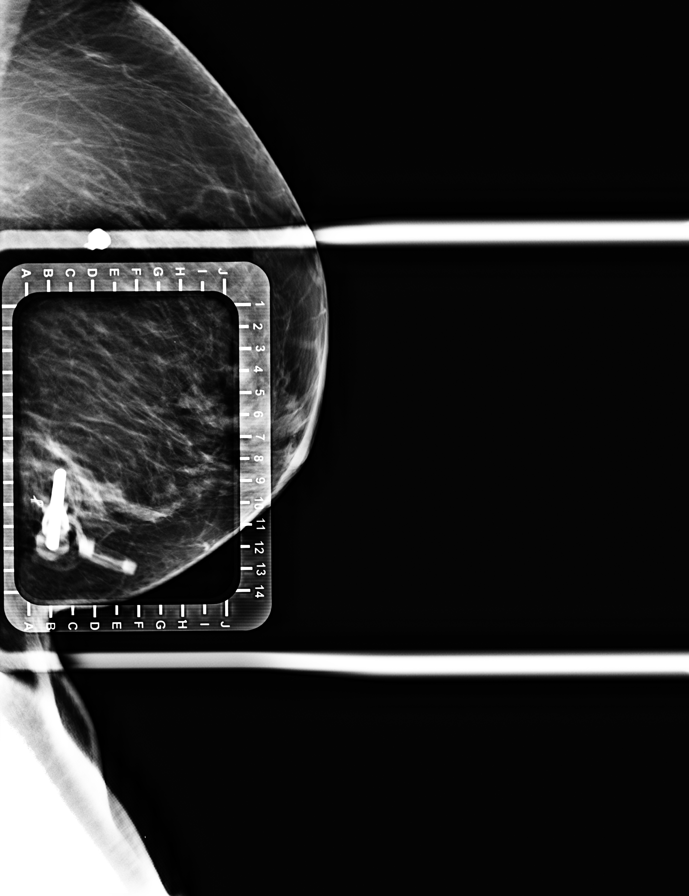

[L ML (3 of 4)]
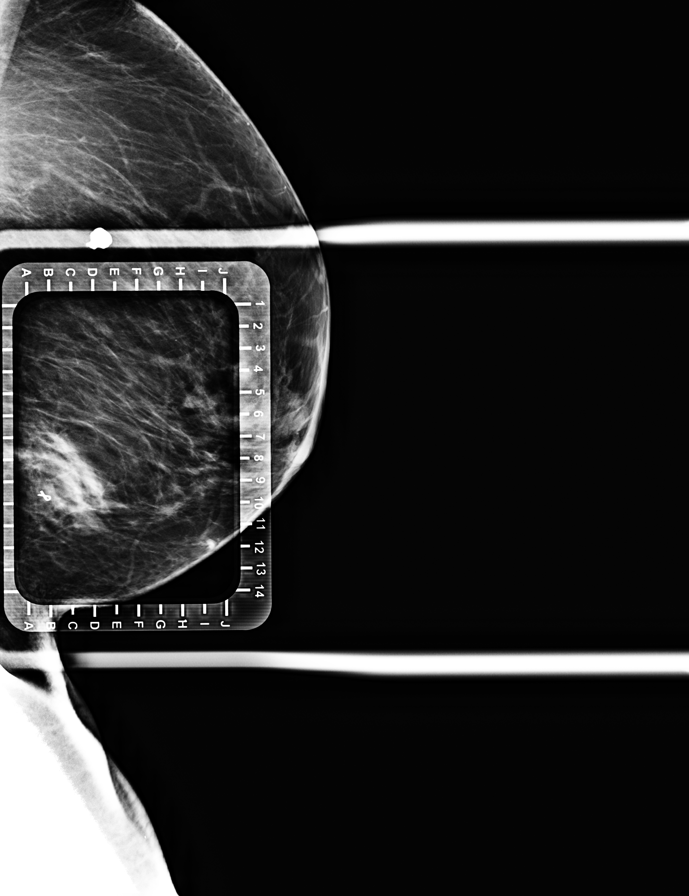

[L CC (3 of 4)]
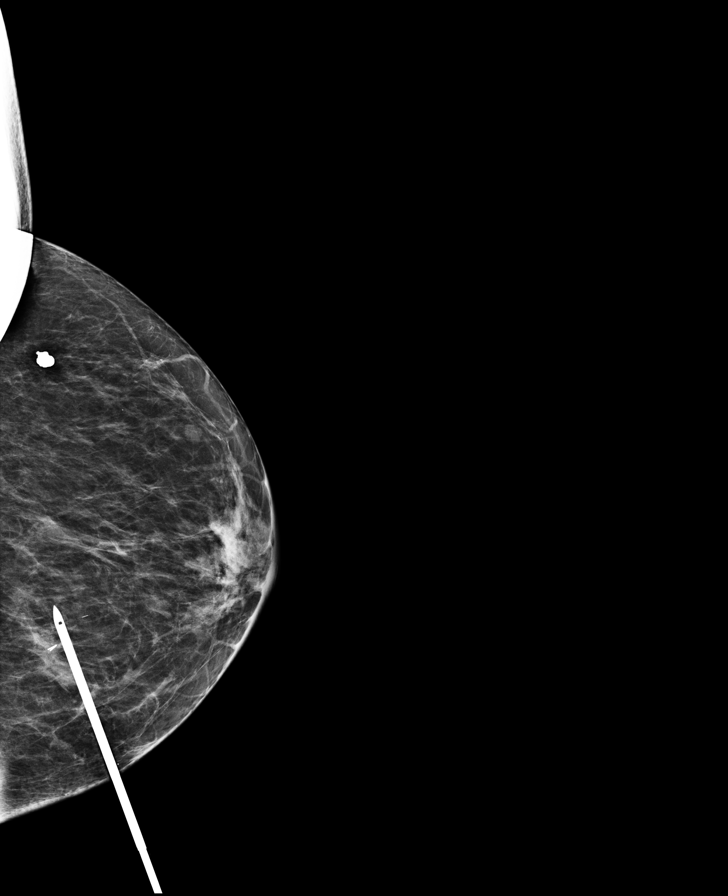

[L CC (4 of 4)]
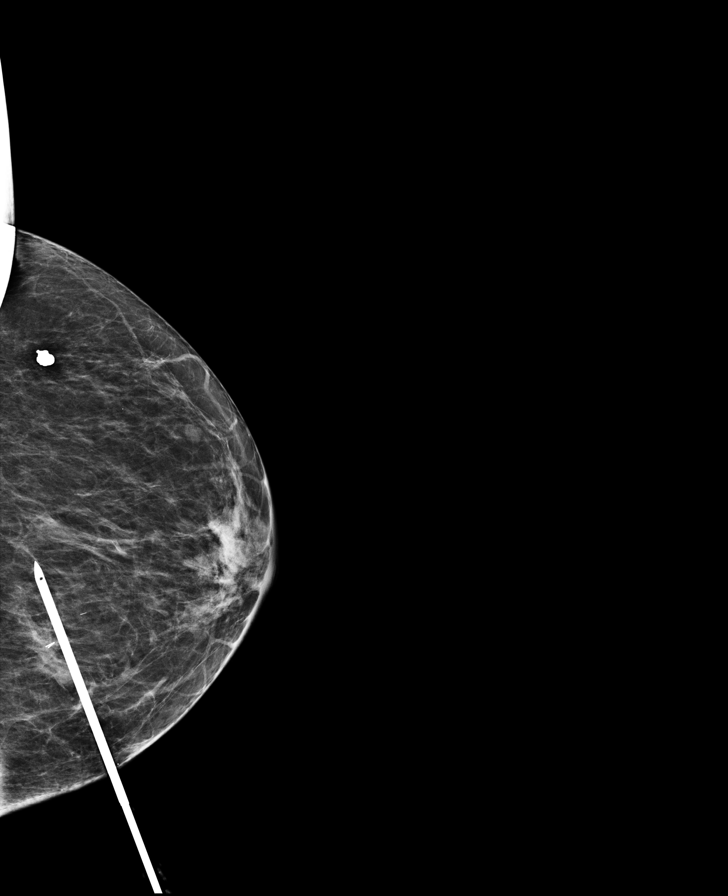

[L ML (4 of 4)]
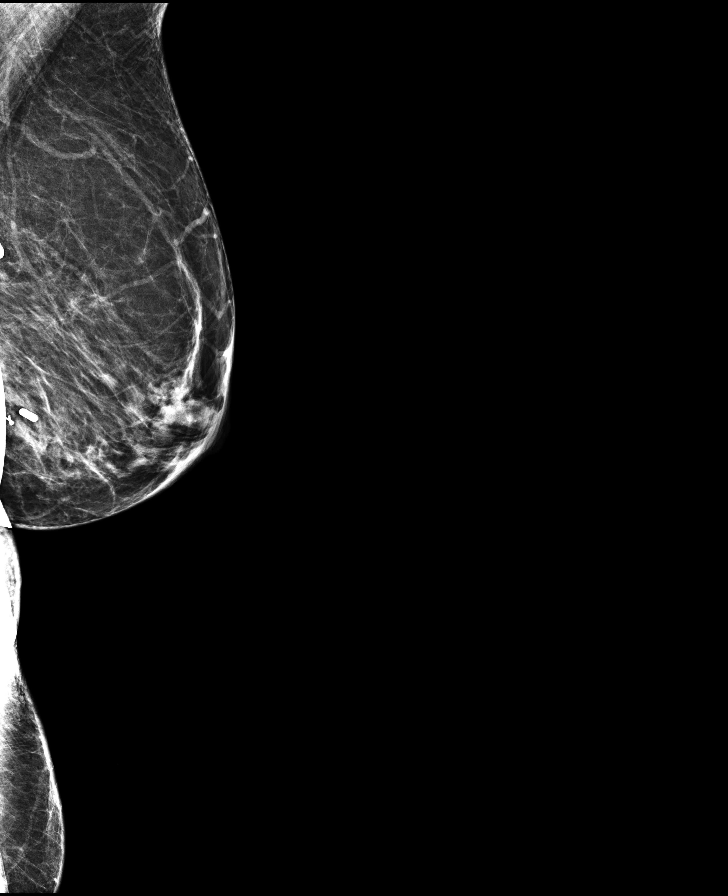

[8 of 10 positions shown; findings below may reference images not displayed]

FINDINGS: Patient presents for radiofrequency device localization prior to
lumpectomy. I met with the patient and we discussed the procedure of
radiofrequency device localization including benefits and
alternatives. We discussed the high likelihood of a successful
procedure. We discussed the risks of the procedure including
infection, bleeding, tissue injury and further surgery. Informed,
written consent was given.

The usual time-out protocol was performed immediately prior to the
procedure.

Using mammographic guidance, sterile technique, 1% lidocaine as
local anesthesia, a radiofrequency tag was used to localize the
biopsy clip within the inner LEFT breast using a medial approach.

The follow-up mammogram images confirm that the RF device is in the
expected location and are marked for Dr. Rudi.

The patient tolerated the procedure well and was released from the
[REDACTED].
IMPRESSION: Radiofrequency device localization of the LEFT breast. No apparent
complications.

## 2020-08-29 NOTE — Telephone Encounter (Signed)
Patient calls stating that she still is having cramping and numbness in her left hand.  She had breast surgery 07/25/20 with Dr Dahlia Byes and she is concerned about the cramping and numbness.  Please call her after 12:00.  Thank you.

## 2020-08-29 NOTE — Telephone Encounter (Signed)
Patient states that she has had two episodes of her hand being numb and kind of locking up. This does go away if she works her hand. She denies any pain or swelling of the arm or hand.  She was concerned if this was something to be worried about. Per Dr Dahlia Byes this could be related to nerve injury from surgery and it should get better with time. She will call back in a month if no improvement or if her symptoms get worse.

## 2020-08-30 ENCOUNTER — Ambulatory Visit
Admission: RE | Admit: 2020-08-30 | Discharge: 2020-08-30 | Disposition: A | Discharge status: Home / Self Care | Payer: Medicare Other | Source: Ambulatory Visit | Attending: Radiation Oncology | Admitting: Radiation Oncology

## 2020-08-30 ENCOUNTER — Telehealth: Payer: Self-pay | Admitting: *Deleted

## 2020-08-30 DIAGNOSIS — Z170 Estrogen receptor positive status [ER+]: Secondary | ICD-10-CM | POA: Diagnosis not present

## 2020-08-30 DIAGNOSIS — Z510 Encounter for antineoplastic radiation therapy: Secondary | ICD-10-CM | POA: Diagnosis not present

## 2020-08-30 DIAGNOSIS — C50512 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 breast: Secondary | ICD-10-CM | POA: Diagnosis not present

## 2020-08-30 NOTE — Telephone Encounter (Signed)
It is not uncommon to have pain after surgery. I would watch it for now. If it gets worse, she should speak to the surgeon

## 2020-08-30 NOTE — Telephone Encounter (Signed)
Patient called reporting that she is having a problem with her left arm and hand feeling kind of numb, with a catching sensation in her hand and is asking if she needs to be concerned about this or if she needs to call her surgeon. She states she is getting radiation therapy daly and is on 7th treatment and is happens more when she is in position for treatment. I asked her if she asked them about it and they told her that it could be a nerve from her surgery, but she wants to know what Dr Janese Banks thinks. Please advise

## 2020-08-30 NOTE — Telephone Encounter (Signed)
Call returned to patient and advised of doctor response, she said she will wait and if it does not get better, she will call surgeon

## 2020-08-31 ENCOUNTER — Ambulatory Visit
Admission: RE | Admit: 2020-08-31 | Discharge: 2020-08-31 | Disposition: A | Discharge status: Home / Self Care | Payer: Medicare Other | Source: Ambulatory Visit | Attending: Radiation Oncology | Admitting: Radiation Oncology

## 2020-08-31 ENCOUNTER — Telehealth: Payer: Self-pay | Admitting: *Deleted

## 2020-08-31 DIAGNOSIS — Z170 Estrogen receptor positive status [ER+]: Secondary | ICD-10-CM | POA: Diagnosis not present

## 2020-08-31 DIAGNOSIS — C50512 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 breast: Secondary | ICD-10-CM | POA: Diagnosis not present

## 2020-08-31 DIAGNOSIS — Z510 Encounter for antineoplastic radiation therapy: Secondary | ICD-10-CM | POA: Diagnosis not present

## 2020-08-31 NOTE — Telephone Encounter (Signed)
Patient called to clarify her lab appointment. Writer confirmed lab appointment on 09/05/20 at 8:45.

## 2020-09-04 ENCOUNTER — Ambulatory Visit
Admission: RE | Admit: 2020-09-04 | Discharge: 2020-09-04 | Disposition: A | Discharge status: Home / Self Care | Payer: Medicare Other | Source: Ambulatory Visit | Attending: Radiation Oncology | Admitting: Radiation Oncology

## 2020-09-04 DIAGNOSIS — Z170 Estrogen receptor positive status [ER+]: Secondary | ICD-10-CM | POA: Diagnosis not present

## 2020-09-04 DIAGNOSIS — Z510 Encounter for antineoplastic radiation therapy: Secondary | ICD-10-CM | POA: Diagnosis not present

## 2020-09-04 DIAGNOSIS — C50512 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 breast: Secondary | ICD-10-CM | POA: Diagnosis not present

## 2020-09-05 ENCOUNTER — Inpatient Hospital Stay: Payer: Medicare Other | Attending: Oncology

## 2020-09-05 ENCOUNTER — Other Ambulatory Visit: Payer: Self-pay

## 2020-09-05 ENCOUNTER — Ambulatory Visit
Admission: RE | Admit: 2020-09-05 | Discharge: 2020-09-05 | Disposition: A | Discharge status: Home / Self Care | Payer: Medicare Other | Source: Ambulatory Visit | Attending: Radiation Oncology | Admitting: Radiation Oncology

## 2020-09-05 DIAGNOSIS — C50512 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 breast: Secondary | ICD-10-CM | POA: Diagnosis not present

## 2020-09-05 DIAGNOSIS — Z170 Estrogen receptor positive status [ER+]: Secondary | ICD-10-CM | POA: Diagnosis not present

## 2020-09-05 DIAGNOSIS — Z510 Encounter for antineoplastic radiation therapy: Secondary | ICD-10-CM | POA: Diagnosis not present

## 2020-09-05 LAB — CBC
HCT: 36.3 % (ref 36.0–46.0)
Hemoglobin: 11.7 g/dL — ABNORMAL LOW (ref 12.0–15.0)
MCH: 27.5 pg (ref 26.0–34.0)
MCHC: 32.2 g/dL (ref 30.0–36.0)
MCV: 85.4 fL (ref 80.0–100.0)
Platelets: 388 10*3/uL (ref 150–400)
RBC: 4.25 MIL/uL (ref 3.87–5.11)
RDW: 12.8 % (ref 11.5–15.5)
WBC: 5.3 10*3/uL (ref 4.0–10.5)
nRBC: 0 % (ref 0.0–0.2)

## 2020-09-06 ENCOUNTER — Ambulatory Visit
Admission: RE | Admit: 2020-09-06 | Discharge: 2020-09-06 | Disposition: A | Discharge status: Home / Self Care | Payer: Medicare Other | Source: Ambulatory Visit | Attending: Radiation Oncology | Admitting: Radiation Oncology

## 2020-09-06 DIAGNOSIS — C50512 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 breast: Secondary | ICD-10-CM | POA: Diagnosis not present

## 2020-09-06 DIAGNOSIS — Z170 Estrogen receptor positive status [ER+]: Secondary | ICD-10-CM | POA: Diagnosis not present

## 2020-09-06 DIAGNOSIS — Z510 Encounter for antineoplastic radiation therapy: Secondary | ICD-10-CM | POA: Diagnosis not present

## 2020-09-07 ENCOUNTER — Ambulatory Visit
Admission: RE | Admit: 2020-09-07 | Discharge: 2020-09-07 | Disposition: A | Discharge status: Home / Self Care | Payer: Medicare Other | Source: Ambulatory Visit | Attending: Radiation Oncology | Admitting: Radiation Oncology

## 2020-09-07 DIAGNOSIS — C50512 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 breast: Secondary | ICD-10-CM | POA: Diagnosis not present

## 2020-09-07 DIAGNOSIS — Z170 Estrogen receptor positive status [ER+]: Secondary | ICD-10-CM | POA: Diagnosis not present

## 2020-09-07 DIAGNOSIS — Z510 Encounter for antineoplastic radiation therapy: Secondary | ICD-10-CM | POA: Diagnosis not present

## 2020-09-08 DIAGNOSIS — Z510 Encounter for antineoplastic radiation therapy: Secondary | ICD-10-CM | POA: Diagnosis not present

## 2020-09-08 DIAGNOSIS — Z170 Estrogen receptor positive status [ER+]: Secondary | ICD-10-CM | POA: Diagnosis not present

## 2020-09-08 DIAGNOSIS — C50512 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 breast: Secondary | ICD-10-CM | POA: Diagnosis not present

## 2020-09-10 ENCOUNTER — Ambulatory Visit
Admission: RE | Admit: 2020-09-10 | Discharge: 2020-09-10 | Disposition: A | Discharge status: Home / Self Care | Payer: Medicare Other | Source: Ambulatory Visit | Attending: Radiation Oncology | Admitting: Radiation Oncology

## 2020-09-10 ENCOUNTER — Ambulatory Visit (INDEPENDENT_AMBULATORY_CARE_PROVIDER_SITE_OTHER): Payer: Medicare Other | Admitting: Surgery

## 2020-09-10 ENCOUNTER — Other Ambulatory Visit: Payer: Self-pay

## 2020-09-10 ENCOUNTER — Encounter: Payer: Self-pay | Admitting: Surgery

## 2020-09-10 VITALS — BP 158/83 | HR 93 | Temp 98.3°F | Ht 65.0 in | Wt 142.0 lb

## 2020-09-10 DIAGNOSIS — C50512 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 breast: Secondary | ICD-10-CM | POA: Diagnosis not present

## 2020-09-10 DIAGNOSIS — Z510 Encounter for antineoplastic radiation therapy: Secondary | ICD-10-CM | POA: Diagnosis not present

## 2020-09-10 DIAGNOSIS — Z170 Estrogen receptor positive status [ER+]: Secondary | ICD-10-CM | POA: Diagnosis not present

## 2020-09-10 DIAGNOSIS — C50912 Malignant neoplasm of unspecified site of left female breast: Secondary | ICD-10-CM

## 2020-09-10 NOTE — Patient Instructions (Addendum)
We will get you set up for an ultrasound of the axilla to see if you have fluid build up.   May do arm exercises, this will not hurt anything.  Follow up here in 3 weeks.

## 2020-09-10 NOTE — Progress Notes (Signed)
Outpatient Surgical Follow Up  09/10/2020  Virginia Warner is an 74 y.o. female.   Chief Complaint  Patient presents with  . Follow-up    numbness in left arm and hand    HPI: Status post lumpectomy left numbness on the hand.  No fevers no chills.  She reports this started shortly after starting radiation therapy.  Past Medical History:  Diagnosis Date  . Allergy   . Arthritis   . Breast cancer ([place])   . Hyperlipidemia   . Hypertension   . Seasonal allergies     Past Surgical History:  Procedure Laterality Date  . ABDOMINAL HYSTERECTOMY     partial  . BREAST BIOPSY Left 06/12/2020   [place] Bx, Ribbon Clip, path pending   . BREAST LUMPECTOMY,RADIO FREQ LOCALIZER,AXILLARY SENTINEL LYMPH NODE BIOPSY Left 07/10/2020   Procedure: BREAST LUMPECTOMY,RADIO FREQ LOCALIZER,AXILLARY SENTINEL LYMPH NODE BIOPSY;  Surgeon: Jules Husbands, MD;  Location: [facility] ORS;  Service: General;  Laterality: Left;  . COLONOSCOPY WITH PROPOFOL N/A 10/18/2015   Procedure: COLONOSCOPY WITH PROPOFOL;  Surgeon: Manya Silvas, MD;  Location: [facility] ENDOSCOPY;  Service: Endoscopy;  Laterality: N/A;  . INGUINAL HERNIA REPAIR     age 32's  . POLYPECTOMY      Family History  Problem Relation Age of Onset  . Cancer Brother   . Breast cancer Paternal Grandmother     Social History:  reports that she quit smoking about 12 years ago. She has never used smokeless tobacco. She reports previous alcohol use. She reports that she does not use drugs.  Allergies:  Allergies  Allergen Reactions  . Aspirin Nausea And Vomiting    Cannot tolerate unless enteric coated  . Atorvastatin Other (See Comments)    Bone pain    Medications reviewed.    ROS Full ROS performed and is otherwise negative other than what is stated in HPI   BP (!) 158/83   Pulse 93   Temp 98.3 F (36.8 C)   Ht 5\' 5"  (1.651 m)   Wt 142 lb (64.4 kg)   SpO2 98%   BMI 23.63 kg/m   Physical Exam NAD Breast: Left lumpectomy scar and  sentinel lymph node biopsy.  Healing well.  There is evidence of some fluid collection in the left axilla measuring 2 x 2 cm.  Breast is sensitive due to radiation. Nolymphedema. Exam left hand and left upper limb reveals no motor deficit, sensation is also intact to the left hand.  Assessment/Plan: 74 year old female with history of lumpectomy for breast cancer and now getting radiation therapy.  She does have some numbness sensation to the left hand.  On physical exam there is no evidence of nerve injuries.  There is however some induration in the left axilla I will interrogate this with an ultrasound.  Likely this is a small seroma.  That might be contributing to the numbness.  We will see her back after we obtain the ultrasound  Caroleen Hamman, MD [place]

## 2020-09-11 ENCOUNTER — Ambulatory Visit
Admission: RE | Admit: 2020-09-11 | Discharge: 2020-09-11 | Disposition: A | Discharge status: Home / Self Care | Payer: Medicare Other | Source: Ambulatory Visit | Attending: Radiation Oncology | Admitting: Radiation Oncology

## 2020-09-11 DIAGNOSIS — Z510 Encounter for antineoplastic radiation therapy: Secondary | ICD-10-CM | POA: Diagnosis not present

## 2020-09-11 DIAGNOSIS — C50512 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 breast: Secondary | ICD-10-CM | POA: Diagnosis not present

## 2020-09-11 DIAGNOSIS — Z170 Estrogen receptor positive status [ER+]: Secondary | ICD-10-CM | POA: Diagnosis not present

## 2020-09-12 ENCOUNTER — Ambulatory Visit
Admission: RE | Admit: 2020-09-12 | Discharge: 2020-09-12 | Disposition: A | Discharge status: Home / Self Care | Payer: Medicare Other | Source: Ambulatory Visit | Attending: Radiation Oncology | Admitting: Radiation Oncology

## 2020-09-12 DIAGNOSIS — Z170 Estrogen receptor positive status [ER+]: Secondary | ICD-10-CM | POA: Diagnosis not present

## 2020-09-12 DIAGNOSIS — Z510 Encounter for antineoplastic radiation therapy: Secondary | ICD-10-CM | POA: Diagnosis not present

## 2020-09-12 DIAGNOSIS — C50512 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 breast: Secondary | ICD-10-CM | POA: Diagnosis not present

## 2020-09-13 ENCOUNTER — Ambulatory Visit
Admission: RE | Admit: 2020-09-13 | Discharge: 2020-09-13 | Disposition: A | Discharge status: Home / Self Care | Payer: Medicare Other | Source: Ambulatory Visit | Attending: Radiation Oncology | Admitting: Radiation Oncology

## 2020-09-13 DIAGNOSIS — Z510 Encounter for antineoplastic radiation therapy: Secondary | ICD-10-CM | POA: Diagnosis not present

## 2020-09-13 DIAGNOSIS — Z23 Encounter for immunization: Secondary | ICD-10-CM | POA: Diagnosis not present

## 2020-09-13 DIAGNOSIS — C50512 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 breast: Secondary | ICD-10-CM | POA: Diagnosis not present

## 2020-09-13 DIAGNOSIS — Z170 Estrogen receptor positive status [ER+]: Secondary | ICD-10-CM | POA: Diagnosis not present

## 2020-09-14 ENCOUNTER — Ambulatory Visit
Admission: RE | Admit: 2020-09-14 | Discharge: 2020-09-14 | Disposition: A | Discharge status: Home / Self Care | Payer: Medicare Other | Source: Ambulatory Visit | Attending: Radiation Oncology | Admitting: Radiation Oncology

## 2020-09-14 DIAGNOSIS — Z170 Estrogen receptor positive status [ER+]: Secondary | ICD-10-CM | POA: Diagnosis not present

## 2020-09-14 DIAGNOSIS — C50512 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 breast: Secondary | ICD-10-CM | POA: Diagnosis not present

## 2020-09-14 DIAGNOSIS — Z510 Encounter for antineoplastic radiation therapy: Secondary | ICD-10-CM | POA: Diagnosis not present

## 2020-09-17 ENCOUNTER — Ambulatory Visit
Admission: RE | Admit: 2020-09-17 | Discharge: 2020-09-17 | Disposition: A | Discharge status: Home / Self Care | Payer: Medicare Other | Source: Ambulatory Visit | Attending: Radiation Oncology | Admitting: Radiation Oncology

## 2020-09-17 DIAGNOSIS — Z170 Estrogen receptor positive status [ER+]: Secondary | ICD-10-CM | POA: Diagnosis not present

## 2020-09-17 DIAGNOSIS — C50512 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 breast: Secondary | ICD-10-CM | POA: Diagnosis not present

## 2020-09-17 DIAGNOSIS — Z510 Encounter for antineoplastic radiation therapy: Secondary | ICD-10-CM | POA: Diagnosis not present

## 2020-09-18 ENCOUNTER — Ambulatory Visit
Admission: RE | Admit: 2020-09-18 | Discharge: 2020-09-18 | Disposition: A | Discharge status: Home / Self Care | Payer: Medicare Other | Source: Ambulatory Visit | Attending: Radiation Oncology | Admitting: Radiation Oncology

## 2020-09-18 DIAGNOSIS — C50512 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 breast: Secondary | ICD-10-CM | POA: Diagnosis not present

## 2020-09-18 DIAGNOSIS — Z510 Encounter for antineoplastic radiation therapy: Secondary | ICD-10-CM | POA: Diagnosis not present

## 2020-09-18 DIAGNOSIS — Z170 Estrogen receptor positive status [ER+]: Secondary | ICD-10-CM | POA: Diagnosis not present

## 2020-09-19 ENCOUNTER — Ambulatory Visit
Admission: RE | Admit: 2020-09-19 | Discharge: 2020-09-19 | Disposition: A | Discharge status: Home / Self Care | Payer: Medicare Other | Source: Ambulatory Visit | Attending: Radiation Oncology | Admitting: Radiation Oncology

## 2020-09-19 ENCOUNTER — Other Ambulatory Visit: Payer: Medicare Other

## 2020-09-19 ENCOUNTER — Other Ambulatory Visit: Payer: Self-pay

## 2020-09-19 DIAGNOSIS — Z170 Estrogen receptor positive status [ER+]: Secondary | ICD-10-CM | POA: Diagnosis not present

## 2020-09-19 DIAGNOSIS — Z510 Encounter for antineoplastic radiation therapy: Secondary | ICD-10-CM | POA: Diagnosis not present

## 2020-09-19 DIAGNOSIS — J309 Allergic rhinitis, unspecified: Secondary | ICD-10-CM

## 2020-09-19 DIAGNOSIS — C50512 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 breast: Secondary | ICD-10-CM | POA: Diagnosis not present

## 2020-09-19 MED ORDER — FLUTICASONE PROPIONATE 50 MCG/ACT NA SUSP: 2.0000 | Freq: Every day | NASAL | 3 refills | Status: DC

## 2020-09-20 ENCOUNTER — Ambulatory Visit
Admission: RE | Admit: 2020-09-20 | Discharge: 2020-09-20 | Disposition: A | Discharge status: Home / Self Care | Payer: Medicare Other | Source: Ambulatory Visit | Attending: Radiation Oncology | Admitting: Radiation Oncology

## 2020-09-20 DIAGNOSIS — C50512 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 breast: Secondary | ICD-10-CM | POA: Diagnosis not present

## 2020-09-20 DIAGNOSIS — Z170 Estrogen receptor positive status [ER+]: Secondary | ICD-10-CM | POA: Diagnosis not present

## 2020-09-20 DIAGNOSIS — Z510 Encounter for antineoplastic radiation therapy: Secondary | ICD-10-CM | POA: Diagnosis not present

## 2020-09-25 ENCOUNTER — Ambulatory Visit
Admission: RE | Admit: 2020-09-25 | Discharge: 2020-09-25 | Disposition: A | Discharge status: Home / Self Care | Payer: Medicare Other | Source: Ambulatory Visit | Attending: Surgery | Admitting: Surgery

## 2020-09-25 ENCOUNTER — Other Ambulatory Visit: Payer: Self-pay

## 2020-09-25 DIAGNOSIS — N6489 Other specified disorders of breast: Secondary | ICD-10-CM | POA: Diagnosis not present

## 2020-09-25 DIAGNOSIS — C50912 Malignant neoplasm of unspecified site of left female breast: Secondary | ICD-10-CM | POA: Diagnosis not present

## 2020-09-26 ENCOUNTER — Telehealth: Payer: Self-pay

## 2020-09-26 NOTE — Telephone Encounter (Signed)
Patient notified per Dr Dahlia Byes that the ultrasound showed a small seroma, no concerns. She will follow up on Monday as scheduled.

## 2020-10-01 ENCOUNTER — Other Ambulatory Visit: Payer: Self-pay

## 2020-10-01 ENCOUNTER — Encounter: Payer: Self-pay | Admitting: Surgery

## 2020-10-01 ENCOUNTER — Ambulatory Visit (INDEPENDENT_AMBULATORY_CARE_PROVIDER_SITE_OTHER): Payer: Medicare Other | Admitting: Surgery

## 2020-10-01 VITALS — BP 144/82 | HR 109 | Temp 98.5°F | Ht 65.0 in | Wt 142.6 lb

## 2020-10-01 DIAGNOSIS — Z09 Encounter for follow-up examination after completed treatment for conditions other than malignant neoplasm: Secondary | ICD-10-CM

## 2020-10-01 NOTE — Progress Notes (Signed)
This is a 74 year old female status post left lumpectomy and sentinel lymph node biopsy for invasive breast cancer on July 10, 2020. ER/PR + Her 2 (-) Last visit she Came for numbness that now has resolved.  She did have a small seroma on the axilla.  There is no evidence of infection there is no evidence of recurrence.  Physical exam: There is no evidence of any new breast lesions on either breast.  Lumpectomy scar and sentinel node biopsy scar look good.  There is evidence of a small 3 x 3 centimeter seroma.  Is completely asymptomatic.  There is no evidence of lymphedema.  A/P dog numbness on the left hand.  Regarding the small seroma in the axilla I gave the patient the option of waiting versus aspiration.  Wishes to wait for now.  We will see her back in a couple months.

## 2020-10-01 NOTE — Patient Instructions (Signed)
Continue to do your arm exercises.   See your appointment below, call the office if you have any questions or concerns.

## 2020-10-08 ENCOUNTER — Ambulatory Visit (INDEPENDENT_AMBULATORY_CARE_PROVIDER_SITE_OTHER): Payer: Medicare Other | Admitting: Nurse Practitioner

## 2020-10-08 ENCOUNTER — Encounter: Payer: Self-pay | Admitting: Nurse Practitioner

## 2020-10-08 ENCOUNTER — Other Ambulatory Visit: Payer: Self-pay

## 2020-10-08 VITALS — BP 148/72 | HR 105 | Temp 97.7°F | Resp 16 | Ht 65.0 in | Wt 140.6 lb

## 2020-10-08 DIAGNOSIS — M810 Age-related osteoporosis without current pathological fracture: Secondary | ICD-10-CM | POA: Diagnosis not present

## 2020-10-08 DIAGNOSIS — C50512 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 breast: Secondary | ICD-10-CM | POA: Diagnosis not present

## 2020-10-08 DIAGNOSIS — I10 Essential (primary) hypertension: Secondary | ICD-10-CM

## 2020-10-08 NOTE — Progress Notes (Signed)
[facility] [place], [place] [address]  Internal MEDICINE  Office Visit Note  Patient Name: Virginia Warner  [id number]  [id number]  Date of Service: 10/24/2020  Chief Complaint  Patient presents with  . Follow-up    shingle shot, jaw, skin, bump under left arm   . Hyperlipidemia  . Hypertension  . policy update form    received    The patient is here for routine follow up. She has recently finished radiation therapy for breast cancer. Does have a pocket of fluid under the left arm, but is otherwise doing well. She has gone back to walking every day. The patient is anxious but has no new concerns or complaints. Blood pressure is generally well managed. She has a little elevated pulse, but no palpitations, chest pain, or chest pressure.      Current Medication: Outpatient Encounter Medications as of 10/08/2020  Medication Sig Note  . acetaminophen (TYLENOL) 500 MG tablet Take 500 mg by mouth daily as needed for moderate pain.    Marland Kitchen amLODipine (NORVASC) 5 MG tablet Take 1 tablet (5 mg total) by mouth daily.   Marland Kitchen aspirin EC 81 MG tablet Take 81 mg by mouth daily.    . fexofenadine (ALLEGRA) 180 MG tablet Take 180 mg by mouth daily.   . fluticasone (FLONASE) 50 MCG/ACT nasal spray Place 2 sprays into both nostrils daily.   Marland Kitchen latanoprost (XALATAN) 0.005 % ophthalmic solution Place 1 drop into both eyes daily.  06/26/2020: Pt states she uses whenever not always at night  . letrozole (FEMARA) 2.5 MG tablet Take 1 tablet (2.5 mg total) by mouth daily. Do not start med until you have completed radiation with 3-4 days break after radiation then start   . loratadine (CLARITIN) 10 MG tablet Take 10 mg by mouth daily.   . Multiple Vitamin (MULTIVITAMIN WITH MINERALS) TABS tablet Take 1 tablet by mouth daily.  06/26/2020: Pt ran out, needs to get more  . [DISCONTINUED] alendronate (FOSAMAX) 70 MG tablet Take 1 tablet (70 mg total) by mouth once a week. Take with a full  glass of water on an empty stomach.   . [DISCONTINUED] lisinopril-hydrochlorothiazide (ZESTORETIC) 20-12.5 MG tablet Take 1 tablet by mouth 2 (two) times daily. (Patient taking differently: Take 1 tablet by mouth in the morning and at bedtime. ) 08/10/2020: Pt takes only 1  a  day   No facility-administered encounter medications on file as of 10/08/2020.    Surgical History: Past Surgical History:  Procedure Laterality Date  . ABDOMINAL HYSTERECTOMY     partial  . BREAST BIOPSY Left 06/12/2020   [place] Bx, Ribbon Clip, path pending   . BREAST LUMPECTOMY,RADIO FREQ LOCALIZER,AXILLARY SENTINEL LYMPH NODE BIOPSY Left 07/10/2020   Procedure: BREAST LUMPECTOMY,RADIO FREQ LOCALIZER,AXILLARY SENTINEL LYMPH NODE BIOPSY;  Surgeon: Jules Husbands, MD;  Location: [facility] ORS;  Service: General;  Laterality: Left;  . COLONOSCOPY WITH PROPOFOL N/A 10/18/2015   Procedure: COLONOSCOPY WITH PROPOFOL;  Surgeon: Manya Silvas, MD;  Location: [facility] ENDOSCOPY;  Service: Endoscopy;  Laterality: N/A;  . INGUINAL HERNIA REPAIR     age 82's  . POLYPECTOMY      Medical History: Past Medical History:  Diagnosis Date  . Allergy   . Arthritis   . Breast cancer ([place])   . Hyperlipidemia   . Hypertension   . Seasonal allergies     Family History: Family History  Problem Relation Age of Onset  . Cancer Brother   .  Breast cancer Paternal Grandmother     Social History   Socioeconomic History  . Marital status: Divorced    Spouse name: Not on file  . Number of children: Not on file  . Years of education: Not on file  . Highest education level: Not on file  Occupational History  . Not on file  Tobacco Use  . Smoking status: Former Smoker    Quit date: 11/2007    Years since quitting: 12.9  . Smokeless tobacco: Never Used  Vaping Use  . Vaping Use: Never used  Substance and Sexual Activity  . Alcohol use: Not Currently    Alcohol/week: 0.0 standard drinks  . Drug use: No  . Sexual activity: Not  Currently  Other Topics Concern  . Not on file  Social History Narrative  . Not on file   Social Determinants of Health   Financial Resource Strain:   . Difficulty of Paying Living Expenses: Not on file  Food Insecurity:   . Worried About Charity fundraiser in the Last Year: Not on file  . Ran Out of Food in the Last Year: Not on file  Transportation Needs:   . Lack of Transportation (Medical): Not on file  . Lack of Transportation (Non-Medical): Not on file  Physical Activity:   . Days of Exercise per Week: Not on file  . Minutes of Exercise per Session: Not on file  Stress:   . Feeling of Stress : Not on file  Social Connections:   . Frequency of Communication with Friends and Family: Not on file  . Frequency of Social Gatherings with Friends and Family: Not on file  . Attends Religious Services: Not on file  . Active Member of Clubs or Organizations: Not on file  . Attends Archivist Meetings: Not on file  . Marital Status: Not on file  Intimate Partner Violence:   . Fear of Current or Ex-Partner: Not on file  . Emotionally Abused: Not on file  . Physically Abused: Not on file  . Sexually Abused: Not on file      Review of Systems  Constitutional: Positive for fatigue. Negative for activity change, chills and unexpected weight change.  HENT: Negative for congestion, postnasal drip, rhinorrhea, sneezing and sore throat.   Respiratory: Negative for cough, chest tightness, shortness of breath and wheezing.   Cardiovascular: Negative for chest pain and palpitations.       Checks blood pressure routinely and is usually well managed.   Gastrointestinal: Negative for abdominal pain, constipation, diarrhea, nausea and vomiting.  Endocrine: Negative for cold intolerance, heat intolerance, polydipsia and polyuria.  Musculoskeletal: Negative for arthralgias, back pain, joint swelling and neck pain.  Skin: Negative for rash.  Allergic/Immunologic: Negative for  environmental allergies.  Neurological: Negative for dizziness, tremors, numbness and headaches.  Hematological: Negative for adenopathy. Does not bruise/bleed easily.  Psychiatric/Behavioral: Negative for behavioral problems (Depression), sleep disturbance and suicidal ideas. The patient is nervous/anxious.     Today's Vitals   10/08/20 1424  BP: (!) 148/72  Pulse: (!) 105  Resp: 16  Temp: 97.7 F (36.5 C)  SpO2: 98%  Weight: 140 lb 9.6 oz (63.8 kg)  Height: 5\' 5"  (1.651 m)   Body mass index is 23.4 kg/m.  Physical Exam Vitals and nursing note reviewed.  Constitutional:      General: She is not in acute distress.    Appearance: Normal appearance. She is well-developed. She is not diaphoretic.  HENT:  Head: Normocephalic and atraumatic.     Mouth/Throat:     Pharynx: No oropharyngeal exudate.  Eyes:     Pupils: Pupils are equal, round, and reactive to light.  Neck:     Thyroid: No thyromegaly.     Vascular: No carotid bruit or JVD.     Trachea: No tracheal deviation.  Cardiovascular:     Rate and Rhythm: Normal rate and regular rhythm.     Heart sounds: Normal heart sounds. No murmur heard.  No friction rub. No gallop.   Pulmonary:     Effort: Pulmonary effort is normal. No respiratory distress.     Breath sounds: Normal breath sounds. No wheezing or rales.  Chest:     Chest wall: No tenderness.  Abdominal:     Palpations: Abdomen is soft.  Musculoskeletal:        General: Normal range of motion.     Cervical back: Normal range of motion and neck supple.  Lymphadenopathy:     Cervical: No cervical adenopathy.  Skin:    General: Skin is warm and dry.  Neurological:     Mental Status: She is alert and oriented to person, place, and time.     Cranial Nerves: No cranial nerve deficit.  Psychiatric:        Attention and Perception: Attention and perception normal.        Mood and Affect: Affect normal. Mood is anxious.        Speech: Speech normal.         Behavior: Behavior normal. Behavior is cooperative.        Thought Content: Thought content normal.        Cognition and Memory: Cognition and memory normal.        Judgment: Judgment normal.    Assessment/Plan: 1. Essential (primary) hypertension Generally stable. Continue bp medication as prescribed   2. Age-related osteoporosis without current pathological fracture Continue fosamax weekly  3. 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 Taking letrozole daily. Follow up with oncology and surgery as scheduled.   General Counseling: jenisis harmsen understanding of the findings of todays visit and agrees with plan of treatment. I have discussed any further diagnostic evaluation that may be needed or ordered today. We also reviewed her medications today. she has been encouraged to call the office with any questions or concerns that should arise related to todays visit.   Hypertension Counseling:   The following hypertensive lifestyle modification were recommended and discussed:  1. Limiting alcohol intake to less than 1 oz/day of ethanol:(24 oz of beer or 8 oz of wine or 2 oz of 100-proof whiskey). 2. Take baby ASA 81 mg daily. 3. Importance of regular aerobic exercise and losing weight. 4. Reduce dietary saturated fat and cholesterol intake for overall cardiovascular health. 5. Maintaining adequate dietary potassium, calcium, and magnesium intake. 6. Regular monitoring of the blood pressure. 7. Reduce sodium intake to less than 100 mmol/day (less than 2.3 gm of sodium or less than 6 gm of sodium choride)   This patient was seen by [place] with Dr [name] as a part of collaborative care agreement  Total time spent: 25 Minutes   Time spent includes review of chart, medications, test results, and follow up plan with the patient.      Dr [name] Internal medicine

## 2020-10-17 ENCOUNTER — Other Ambulatory Visit: Payer: Self-pay

## 2020-10-17 DIAGNOSIS — I10 Essential (primary) hypertension: Secondary | ICD-10-CM

## 2020-10-17 MED ORDER — LISINOPRIL-HYDROCHLOROTHIAZIDE 20-12.5 MG PO TABS: 1.0000 | ORAL_TABLET | Freq: Two times a day (BID) | ORAL | 3 refills | Status: DC

## 2020-10-18 ENCOUNTER — Other Ambulatory Visit: Payer: Self-pay

## 2020-10-18 DIAGNOSIS — M810 Age-related osteoporosis without current pathological fracture: Secondary | ICD-10-CM

## 2020-10-18 MED ORDER — ALENDRONATE SODIUM 70 MG PO TABS: 70.0000 mg | ORAL_TABLET | ORAL | 3 refills | Status: DC

## 2020-10-22 ENCOUNTER — Ambulatory Visit
Admission: RE | Admit: 2020-10-22 | Discharge: 2020-10-22 | Disposition: A | Discharge status: Home / Self Care | Payer: Medicare Other | Source: Ambulatory Visit | Attending: Radiation Oncology | Admitting: Radiation Oncology

## 2020-10-22 ENCOUNTER — Encounter: Payer: Self-pay | Admitting: Radiation Oncology

## 2020-10-22 ENCOUNTER — Other Ambulatory Visit: Payer: Self-pay

## 2020-10-22 VITALS — BP 156/94 | HR 109 | Temp 97.5°F | Wt 137.0 lb

## 2020-10-22 DIAGNOSIS — Z170 Estrogen receptor positive status [ER+]: Secondary | ICD-10-CM

## 2020-10-22 NOTE — Progress Notes (Signed)
Radiation Oncology Follow up Note  Name: Virginia Warner   Date:   10/22/2020 MRN:  [id number] DOB: [birth year]    This 74 y.o. 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  REFERRING PROVIDER: Ronnell [place], NP  HPI: Patient is a.  74 year old female now at 1 month having completed whole breast radiation to her left breast status post wide local excision for stage Ia ER/PR positive invasive mammary carcinoma seen today in routine follow-up she is doing well.  Still has some hyperpigmentation of the skin which we expect.  Specifically denies breast tenderness cough or bone pain.  She has been started on Femara tolerating that well without side effect.  COMPLICATIONS OF TREATMENT: none  FOLLOW UP COMPLIANCE: keeps appointments   PHYSICAL EXAM:  BP (!) 156/94 (BP Location: Right Arm, Patient Position: Sitting, Cuff Size: Normal)   Pulse (!) 109   Temp (!) 97.5 F (36.4 C) (Tympanic)   Wt 137 lb (62.1 kg)   BMI 22.80 kg/m  Lungs are clear to A&P cardiac examination essentially unremarkable with regular rate and rhythm. No dominant mass or nodularity is noted in either breast in 2 positions examined. Incision is well-healed. No axillary or supraclavicular adenopathy is appreciated. Cosmetic result is excellent.  Still some hyperpigmentation especially around the nipple region.  Well-developed well-nourished patient in NAD. HEENT reveals PERLA, EOMI, discs not visualized.  Oral cavity is clear. No oral mucosal lesions are identified. Neck is clear without evidence of cervical or supraclavicular adenopathy. Lungs are clear to A&P. Cardiac examination is essentially unremarkable with regular rate and rhythm without murmur rub or thrill. Abdomen is benign with no organomegaly or masses noted. Motor sensory and  DTR levels are equal and symmetric in the upper and lower extremities. Cranial nerves II through XII are grossly intact. Proprioception is intact. No peripheral adenopathy or edema is identified. No motor or sensory levels are noted. Crude visual fields are within normal range.  RADIOLOGY RESULTS: No current films to review  PLAN: Present time patient is doing extremely well 1 month out from whole breast radiation and pleased with her overall progress.  She is started Femara which she is tolerating well without side effect.  I have asked to see her back in 4 to 5 months for follow-up.  Patient knows to call with any concerns.  I would like to take this opportunity to thank you for allowing me to participate in the care of your patient.Noreene Filbert, MD

## 2020-10-30 ENCOUNTER — Other Ambulatory Visit: Payer: Self-pay | Admitting: Oncology

## 2020-11-20 IMAGING — US US BREAST*L* LIMITED INC AXILLA
2 series · 7 of 7 positions shown · non-contrast
Comparison: Previous exam(s).

CLINICAL DATA: Palpable left axillary mass. Status left lumpectomy
and left axillary lymph node resection breast cancer 07/11/2019.

EXAM:
ULTRASOUND OF THE LEFT AXILLA

[Series 1: us breast*left* limited inc axilla · 0.09mm/px · 4 of 4 slices shown (1 of 2)]
[im 1/4]
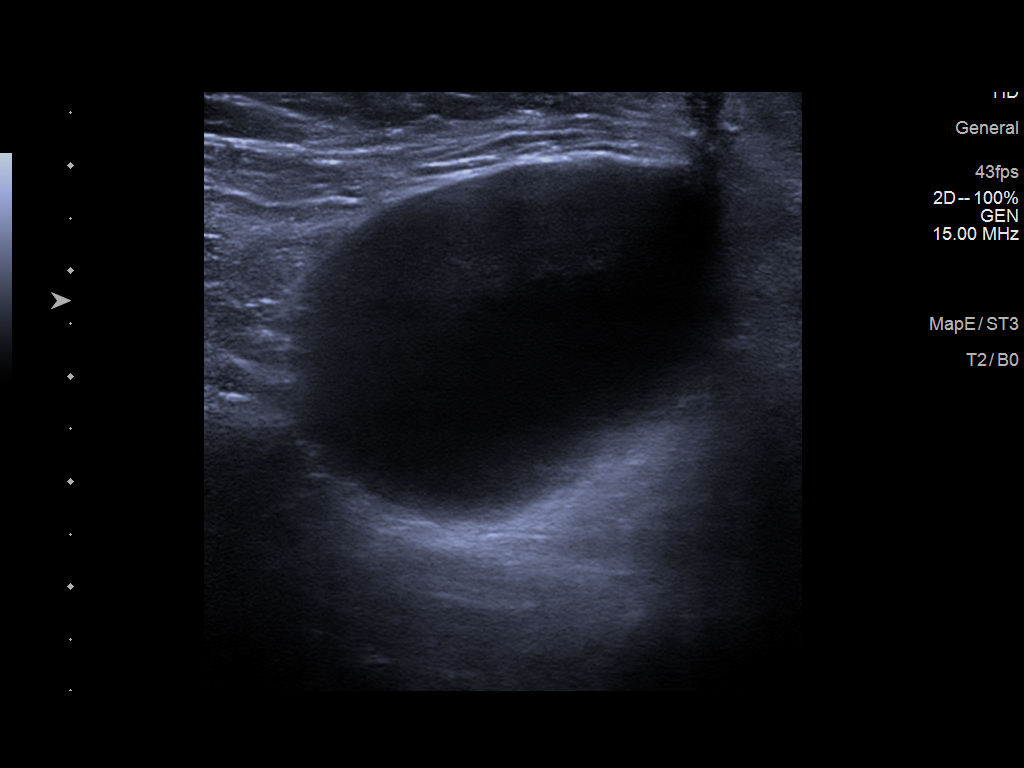
[im 2/4]
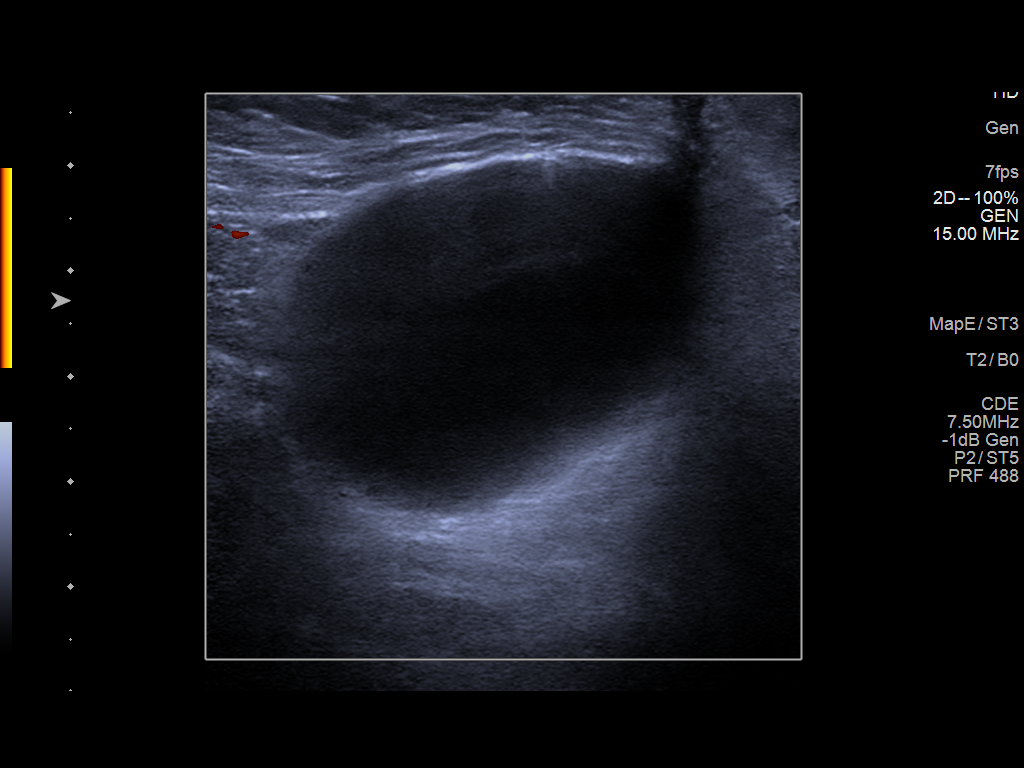
[im 3/4]
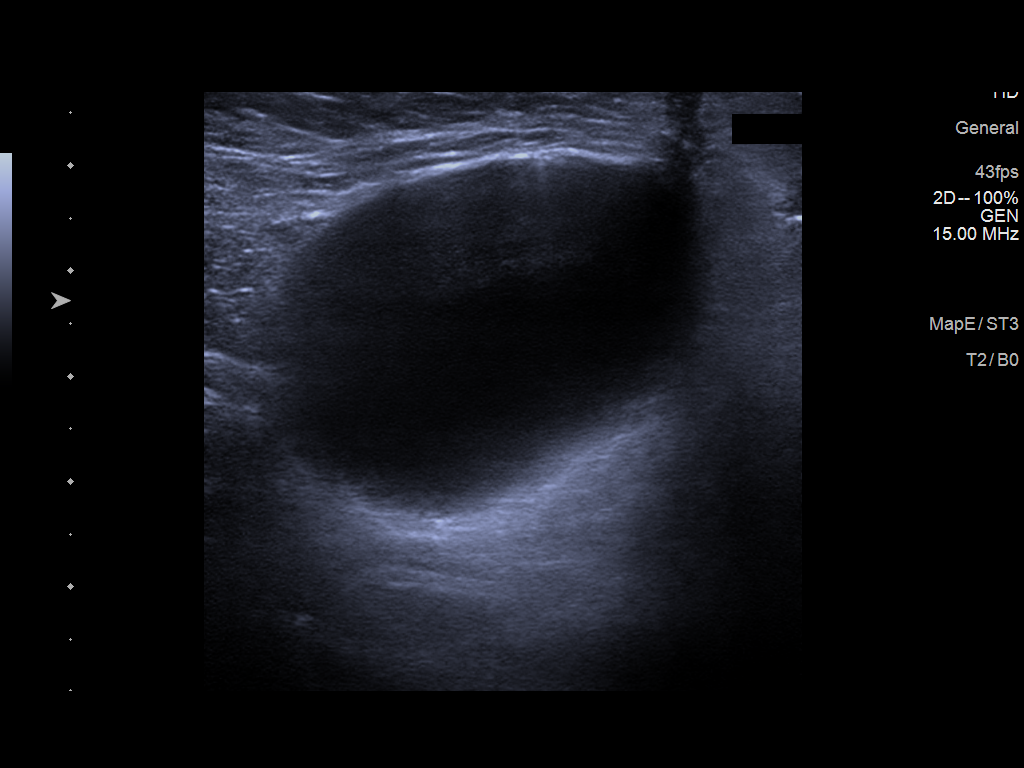
[im 4/4]
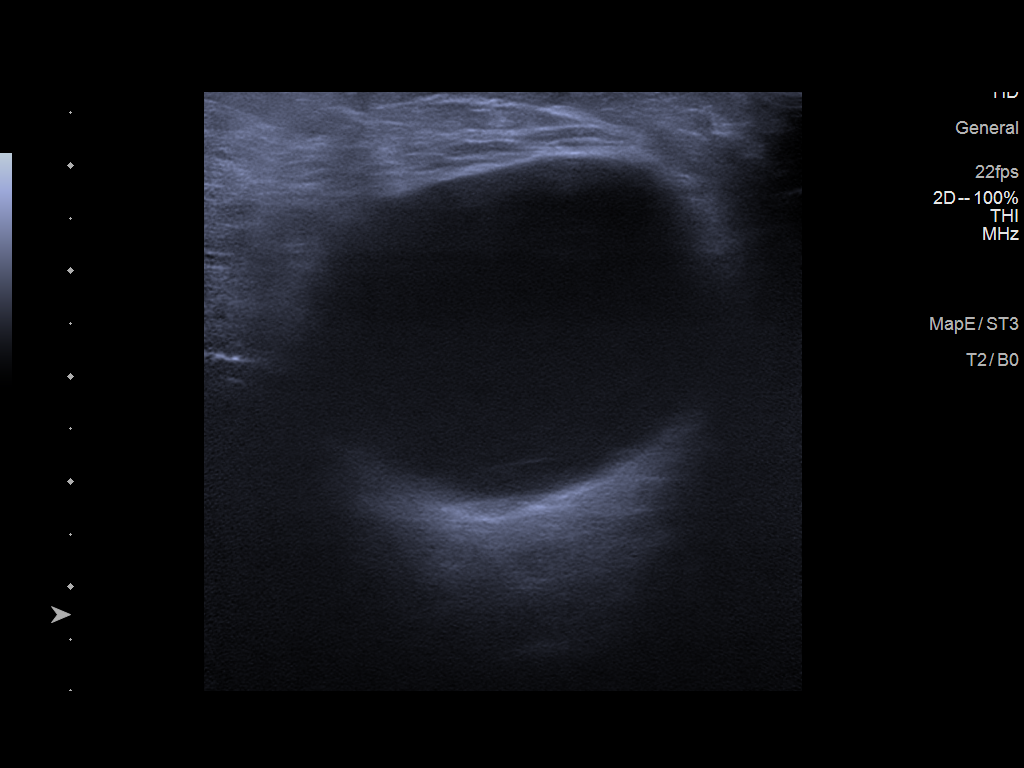

[Series 2: us breast*left* limited inc axilla · 0.08mm/px · 3 of 3 slices shown (2 of 2)]
[im 1/3]
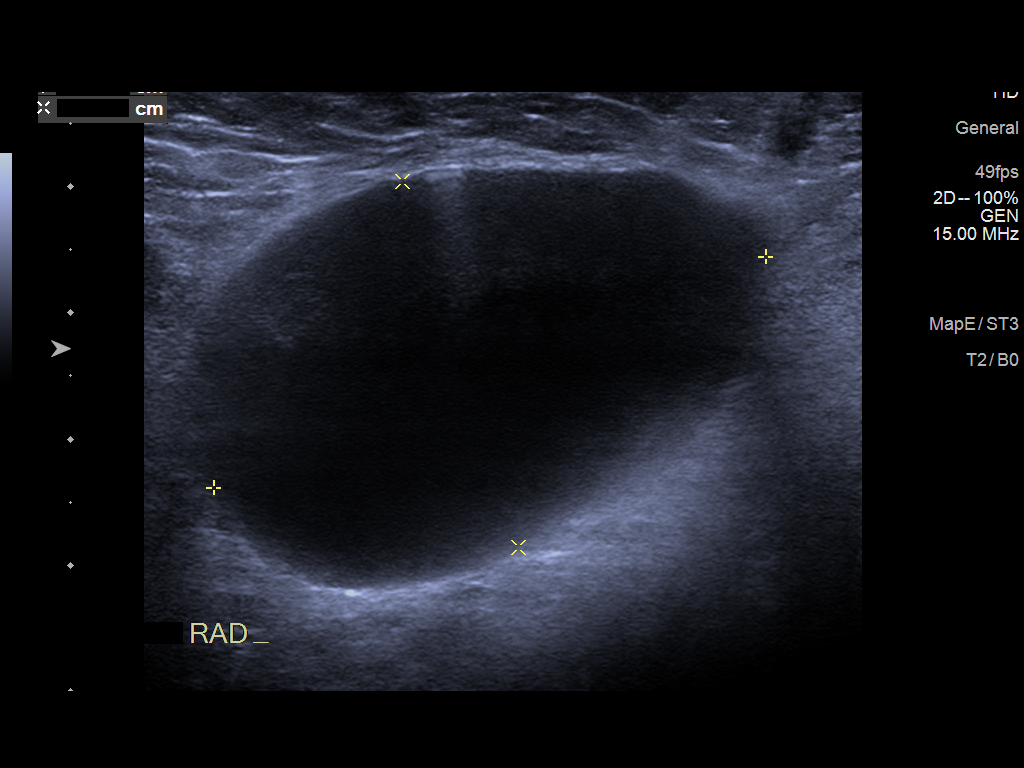
[im 2/3]
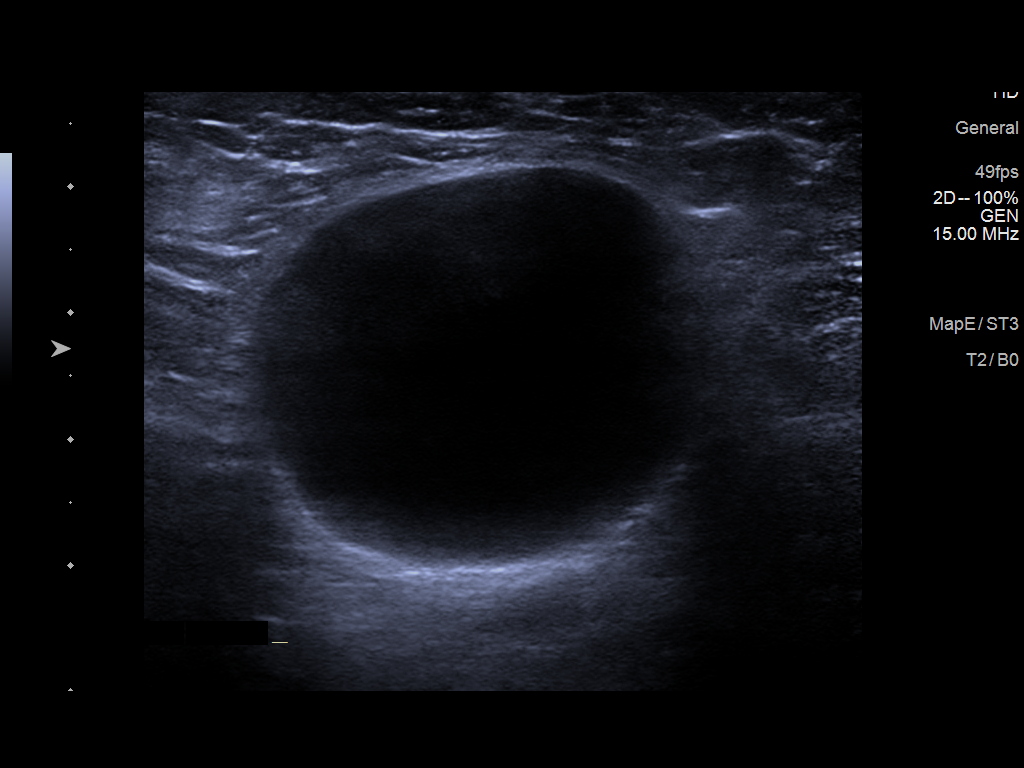
[im 3/3]
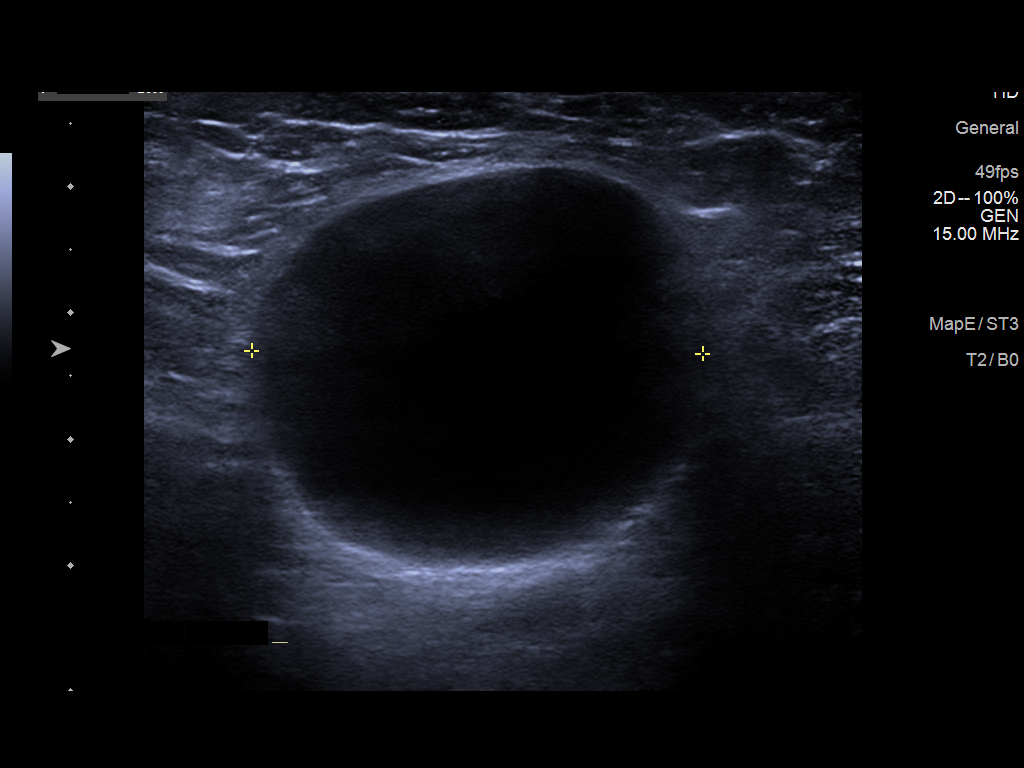

[7 of 7 positions shown; findings below may reference images not displayed]

FINDINGS: On physical exam, there is an approximately 4 x 3 cm firm, palpable,
oval mass in the left axilla. There is no skin redness, increased
warmth or tenderness today.

Targeted ultrasound is performed, showing a 4.7 x 3.6 x 3.0 cm oval
fluid collection with increased through transmission of sound in the
left axilla. This is at the location of the patient's axillary
surgical scar. No lymph nodes were visualized.
IMPRESSION: 4.7 cm left axillary seroma.  No evidence of malignancy.

RECOMMENDATION:
Bilateral diagnostic mammogram in 8 months. That will be 1 year
since mammographic evaluation of both breasts.

I have discussed the findings and recommendations with the patient.
If applicable, a reminder letter will be sent to the patient
regarding the next appointment.

BI-RADS CATEGORY  2: Benign.

## 2020-11-29 ENCOUNTER — Inpatient Hospital Stay (HOSPITAL_BASED_OUTPATIENT_CLINIC_OR_DEPARTMENT_OTHER): Payer: Medicare Other | Admitting: Oncology

## 2020-11-29 ENCOUNTER — Inpatient Hospital Stay: Payer: Medicare Other | Attending: Oncology

## 2020-11-29 ENCOUNTER — Other Ambulatory Visit: Payer: Self-pay

## 2020-11-29 ENCOUNTER — Encounter (INDEPENDENT_AMBULATORY_CARE_PROVIDER_SITE_OTHER): Payer: Self-pay

## 2020-11-29 ENCOUNTER — Encounter: Payer: Self-pay | Admitting: Oncology

## 2020-11-29 VITALS — BP 152/87 | HR 106 | Temp 97.7°F | Resp 20 | Wt 140.5 lb

## 2020-11-29 DIAGNOSIS — C50912 Malignant neoplasm of unspecified site of left female breast: Secondary | ICD-10-CM | POA: Insufficient documentation

## 2020-11-29 DIAGNOSIS — C50512 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 breast: Secondary | ICD-10-CM

## 2020-11-29 DIAGNOSIS — Z7982 Long term (current) use of aspirin: Secondary | ICD-10-CM | POA: Diagnosis not present

## 2020-11-29 DIAGNOSIS — E785 Hyperlipidemia, unspecified: Secondary | ICD-10-CM | POA: Insufficient documentation

## 2020-11-29 DIAGNOSIS — Z87891 Personal history of nicotine dependence: Secondary | ICD-10-CM | POA: Diagnosis not present

## 2020-11-29 DIAGNOSIS — I10 Essential (primary) hypertension: Secondary | ICD-10-CM | POA: Insufficient documentation

## 2020-11-29 DIAGNOSIS — Z79899 Other long term (current) drug therapy: Secondary | ICD-10-CM | POA: Insufficient documentation

## 2020-11-29 DIAGNOSIS — Z923 Personal history of irradiation: Secondary | ICD-10-CM | POA: Insufficient documentation

## 2020-11-29 DIAGNOSIS — Z79811 Long term (current) use of aromatase inhibitors: Secondary | ICD-10-CM

## 2020-11-29 DIAGNOSIS — Z170 Estrogen receptor positive status [ER+]: Secondary | ICD-10-CM | POA: Insufficient documentation

## 2020-11-29 DIAGNOSIS — M1990 Unspecified osteoarthritis, unspecified site: Secondary | ICD-10-CM | POA: Diagnosis not present

## 2020-11-29 LAB — COMPREHENSIVE METABOLIC PANEL
ALT: 19 U/L (ref 0–44)
AST: 19 U/L (ref 15–41)
Albumin: 4.2 g/dL (ref 3.5–5.0)
Alkaline Phosphatase: 32 U/L — ABNORMAL LOW (ref 38–126)
Anion gap: 14 (ref 5–15)
BUN: 9 mg/dL (ref 8–23)
CO2: 24 mmol/L (ref 22–32)
Calcium: 9.4 mg/dL (ref 8.9–10.3)
Chloride: 95 mmol/L — ABNORMAL LOW (ref 98–111)
Creatinine, Ser: 0.75 mg/dL (ref 0.44–1.00)
GFR, Estimated: >60 mL/min (ref >60)
Glucose, Bld: 145 mg/dL — ABNORMAL HIGH (ref 70–99)
Potassium: 3.1 mmol/L — ABNORMAL LOW (ref 3.5–5.1)
Sodium: 133 mmol/L — ABNORMAL LOW (ref 135–145)
Total Bilirubin: 0.8 mg/dL (ref 0.3–1.2)
Total Protein: 7.4 g/dL (ref 6.5–8.1)

## 2020-11-29 MED ORDER — POTASSIUM CHLORIDE CRYS ER 20 MEQ PO TBCR
20.0000 meq | EXTENDED_RELEASE_TABLET | Freq: Two times a day (BID) | ORAL | 0 refills | Status: DC
Start: 2020-11-29 — End: 2021-03-14

## 2020-12-03 ENCOUNTER — Ambulatory Visit (INDEPENDENT_AMBULATORY_CARE_PROVIDER_SITE_OTHER): Payer: Medicare Other | Admitting: Surgery

## 2020-12-03 ENCOUNTER — Encounter: Payer: Self-pay | Admitting: Surgery

## 2020-12-03 ENCOUNTER — Other Ambulatory Visit: Payer: Self-pay

## 2020-12-03 VITALS — BP 151/92 | HR 113 | Temp 98.2°F | Ht 64.0 in | Wt 140.6 lb

## 2020-12-03 DIAGNOSIS — C50912 Malignant neoplasm of unspecified site of left female breast: Secondary | ICD-10-CM

## 2020-12-03 DIAGNOSIS — Z23 Encounter for immunization: Secondary | ICD-10-CM | POA: Diagnosis not present

## 2020-12-03 NOTE — Progress Notes (Signed)
Outpatient Surgical Follow Up  12/03/2020  Virginia Warner is an 74 y.o. female.   Chief Complaint  Patient presents with  . Routine Post Op    SLN bx 06/2020    HPI: This is a 74 year old female status post left lumpectomy and sentinel lymph node biopsy for invasive breast cancer on July 10, 2020. ER/PR + Her 2 (-) .  She did have a small seroma on the axilla.  There is no evidence of infection there is no evidence of recurrence. She completed radiation therapy.  She still has small seroma on the left axilla.  She is a little bit sore from the radiation.  Denies any fevers and chills. U/S personally reviewed showing seroma. She states seroma has decreased in size  Past Medical History:  Diagnosis Date  . Allergy   . Arthritis   . Breast cancer ([place])   . Hyperlipidemia   . Hypertension   . Seasonal allergies     Past Surgical History:  Procedure Laterality Date  . ABDOMINAL HYSTERECTOMY     partial  . BREAST BIOPSY Left 06/12/2020   [place] Bx, Ribbon Clip, path pending   . BREAST LUMPECTOMY,RADIO FREQ LOCALIZER,AXILLARY SENTINEL LYMPH NODE BIOPSY Left 07/10/2020   Procedure: BREAST LUMPECTOMY,RADIO FREQ LOCALIZER,AXILLARY SENTINEL LYMPH NODE BIOPSY;  Surgeon: Virginia Husbands, MD;  Location: [facility] ORS;  Service: General;  Laterality: Left;  . COLONOSCOPY WITH PROPOFOL N/A 10/18/2015   Procedure: COLONOSCOPY WITH PROPOFOL;  Surgeon: Virginia Silvas, MD;  Location: [facility] ENDOSCOPY;  Service: Endoscopy;  Laterality: N/A;  . INGUINAL HERNIA REPAIR     age 35's  . POLYPECTOMY      Family History  Problem Relation Age of Onset  . Cancer Brother   . Breast cancer Paternal Grandmother     Social History:  reports that she quit smoking about 13 years ago. She has never used smokeless tobacco. She reports previous alcohol use. She reports that she does not use drugs.  Allergies:  Allergies  Allergen Reactions  . Aspirin Nausea And Vomiting    Cannot tolerate unless enteric coated  .  Atorvastatin Other (See Comments)    Bone pain    Medications reviewed.    ROS Full ROS performed and is otherwise negative other than what is stated in HPI   BP (!) 151/92   Pulse (!) 113   Temp 98.2 F (36.8 C) (Oral)   Ht 5\' 4"  (1.626 m)   Wt 140 lb 9.6 oz (63.8 kg)   SpO2 99%   BMI 24.13 kg/m   Physical Exam Vitals and nursing note reviewed. Exam conducted with a chaperone present.  Constitutional:      Appearance: Normal appearance. She is normal weight.  Cardiovascular:     Rate and Rhythm: Normal rate and regular rhythm.  Pulmonary:     Effort: Pulmonary effort is normal. No respiratory distress.     Breath sounds: Normal breath sounds. No stridor.     Comments: Breast: Evidence of prior  Lumpectomy and SLNB on the left. Some erythema from recent XRT. No masses. Axillary seroma. No infection, no lymphedema Abdominal:     General: Abdomen is flat. There is no distension.     Palpations: Abdomen is soft. There is no mass.     Tenderness: There is no abdominal tenderness. There is no guarding or rebound.     Hernia: No hernia is present.  Musculoskeletal:        General: Normal range of motion.  Cervical back: Normal range of motion and neck supple. No rigidity or tenderness.  Skin:    General: Skin is warm and dry.     Capillary Refill: Capillary refill takes less than 2 seconds.  Neurological:     General: No focal deficit present.     Mental Status: She is alert and oriented to person, place, and time.  Psychiatric:        Mood and Affect: Mood normal.        Behavior: Behavior normal.        Thought Content: Thought content normal.        Judgment: Judgment normal.    Assessment/Plan:  74 year old female status post lumpectomy sentinel lymph node biopsy and radiation therapy for left breast cancer / Residual left axillary seroma. Pt wishes to wait for now. We will make appt in 4 months. She will need mammo on 05/2021  Greater than 50% of the 25  minutes  visit was spent in counseling/coordination of care   Virginia Hamman, MD [place] Surgeon

## 2020-12-03 NOTE — Patient Instructions (Signed)
We will contact you May 2022 to schedule your Mammogram and breast exam for June 2022 with Dr.Pabon. If you do not hear from our office the end of May please call our office to let us know.

## 2020-12-04 NOTE — Progress Notes (Signed)
Hematology/Oncology Consult note 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 This was followed by diagnostic mammogram and ultrasound which showed a 7 x 7 x 4 mm mass 3 cm from the nipple at the 7 o'clock position. Normal-appearing left axillary lymph nodes. This was biopsied and was consistent with invasive mammary carcinoma with mucinous and micropapillary features.ER strongly positive greater than 90%, PR 1 to 10% positive and HER-2 negative  Patient underwent lumpectomy and sentinel lymph node biopsy on 07/10/2020.  Final pathology showed 8 mm grade 1 invasive mucinous carcinoma with negative margins.  Sentinel lymph nodes were negative for malignancy.  Patient completed adjuvant radiation therapy and started letrozole in November 2021  Interval history-patient is tolerating letrozole along with calcium and vitamin D well without any significant side effects.  Denies any significant joint pain or hot flashes.  ECOG PS- 1 Pain scale- 0   Review of systems- Review of Systems  Constitutional: Negative for chills,  fever, malaise/fatigue and weight loss.  HENT: Negative for congestion, ear discharge and nosebleeds.   Eyes: Negative for blurred vision.  Respiratory: Negative for cough, hemoptysis, sputum production, shortness of breath and wheezing.   Cardiovascular: Negative for chest pain, palpitations, orthopnea and claudication.  Gastrointestinal: Negative for abdominal pain, blood in stool, constipation, diarrhea, heartburn, melena, nausea and vomiting.  Genitourinary: Negative for dysuria, flank pain, frequency, hematuria and urgency.  Musculoskeletal: Negative for back pain, joint pain and myalgias.  Skin: Negative for rash.  Neurological: Negative for dizziness, tingling, focal weakness, seizures, weakness and headaches.  Endo/Heme/Allergies: Does not bruise/bleed easily.  Psychiatric/Behavioral: Negative for depression and suicidal ideas. The patient does not have insomnia.       Allergies  Allergen Reactions  . Aspirin Nausea And Vomiting    Cannot tolerate unless enteric coated  . Atorvastatin Other (See Comments)    Bone pain     Past Medical History:  Diagnosis Date  . Allergy   . Arthritis   . Breast cancer ([place])   . Hyperlipidemia   . Hypertension   . Seasonal allergies      Past Surgical History:  Procedure Laterality Date  . ABDOMINAL HYSTERECTOMY     partial  . BREAST BIOPSY Left 06/12/2020   [place] Bx, Ribbon Clip, path pending   . BREAST LUMPECTOMY,RADIO FREQ LOCALIZER,AXILLARY SENTINEL LYMPH NODE BIOPSY Left 07/10/2020   Procedure: BREAST LUMPECTOMY,RADIO FREQ LOCALIZER,AXILLARY SENTINEL LYMPH NODE BIOPSY;  Surgeon: Jules Husbands, MD;  Location: [facility] ORS;  Service: General;  Laterality: Left;  . COLONOSCOPY WITH PROPOFOL N/A 10/18/2015   Procedure: COLONOSCOPY WITH PROPOFOL;  Surgeon: Manya Silvas, MD;  Location[facility] ENDOSCOPY;  Service: Endoscopy;  Laterality: N/A;  . INGUINAL HERNIA REPAIR  age 39's  . POLYPECTOMY      Social History   Socioeconomic  History  . Marital status: Divorced    Spouse name: Not on file  . Number of children: Not on file  . Years of education: Not on file  . Highest education level: Not on file  Occupational History  . Not on file  Tobacco Use  . Smoking status: Former Smoker    Quit date: 11/2007    Years since quitting: 13.0  . Smokeless tobacco: Never Used  Vaping Use  . Vaping Use: Never used  Substance and Sexual Activity  . Alcohol use: Not Currently    Alcohol/week: 0.0 standard drinks  . Drug use: No  . Sexual activity: Not Currently  Other Topics Concern  . Not on file  Social History Narrative  . Not on file   Social Determinants of Health   Financial Resource Strain:   . Difficulty of Paying Living Expenses: Not on file  Food Insecurity:   . Worried About Charity fundraiser in the Last Year: Not on file  . Ran Out of Food in the Last Year: Not on file  Transportation Needs:   . Lack of Transportation (Medical): Not on file  . Lack of Transportation (Non-Medical): Not on file  Physical Activity:   . Days of Exercise per Week: Not on file  . Minutes of Exercise per Session: Not on file  Stress:   . Feeling of Stress : Not on file  Social Connections:   . Frequency of Communication with Friends and Family: Not on file  . Frequency of Social Gatherings with Friends and Family: Not on file  . Attends Religious Services: Not on file  . Active Member of Clubs or Organizations: Not on file  . Attends Archivist Meetings: Not on file  . Marital Status: Not on file  Intimate Partner Violence:   . Fear of Current or Ex-Partner: Not on file  . Emotionally Abused: Not on file  . Physically Abused: Not on file  . Sexually Abused: Not on file    Family History  Problem Relation Age of Onset  . Cancer Brother   . Breast cancer Paternal Grandmother      Current Outpatient Medications:  .  acetaminophen (TYLENOL) 500 MG tablet, Take 500 mg by mouth daily as needed for  moderate pain. , Disp: , Rfl:  .  alendronate (FOSAMAX) 70 MG tablet, Take 1 tablet (70 mg total) by mouth once a week. Take with a full glass of water on an empty stomach., Disp: 4 tablet, Rfl: 3 .  amLODipine (NORVASC) 5 MG tablet, Take 1 tablet (5 mg total) by mouth daily., Disp: 30 tablet, Rfl: 5 .  aspirin EC 81 MG tablet, Take 81 mg by mouth daily. , Disp: , Rfl:  .  fexofenadine (ALLEGRA) 180 MG tablet, Take 180 mg by mouth daily., Disp: , Rfl:  .  fluticasone (FLONASE) 50 MCG/ACT nasal spray, Place 2 sprays into both nostrils daily., Disp: 16 g, Rfl: 3 .  latanoprost (XALATAN) 0.005 % ophthalmic solution, Place 1 drop into both eyes daily. , Disp: , Rfl:  .  letrozole (FEMARA) 2.5 MG tablet, ONE TABLET DAILY DO NOT START UNTIL COMPLETED RADIATION WITH 3 OR 4 DAY BREAK AFTER RADIATION THEN START, Disp: 30 tablet, Rfl: 1 .  lisinopril-hydrochlorothiazide (ZESTORETIC) 20-12.5 MG tablet, Take 1 tablet by mouth 2 (two) times daily., Disp: 180 tablet, Rfl: 3 .  loratadine (CLARITIN)  10 MG tablet, Take 10 mg by mouth daily., Disp: , Rfl:  .  Multiple Vitamin (MULTIVITAMIN WITH MINERALS) TABS tablet, Take 1 tablet by mouth daily. , Disp: , Rfl:  .  potassium chloride SA (KLOR-CON) 20 MEQ tablet, Take 1 tablet (20 mEq total) by mouth 2 (two) times daily for 14 days., Disp: 28 tablet, Rfl: 0  Physical exam:  Vitals:   11/29/20 1109  BP: (!) 152/87  Pulse: (!) 106  Resp: 20  Temp: 97.7 F (36.5 C)  TempSrc: Tympanic  SpO2: 100%  Weight: 140 lb 8 oz (63.7 kg)   Physical Exam Constitutional:      General: She is not in acute distress. Cardiovascular:     Rate and Rhythm: Normal rate and regular rhythm.     Heart sounds: Normal heart sounds.  Pulmonary:     Effort: Pulmonary effort is normal.     Breath sounds: Normal breath sounds.  Abdominal:     General: Bowel sounds are normal.     Palpations: Abdomen is soft.  Skin:    General: Skin is warm and dry.  Neurological:     Mental  Status: She is alert and oriented to person, place, and time.   Breast exam: Skin darkening noted over left breast from recent radiation.  Small seroma noted in the left axilla.  CMP Latest Ref Rng & Units 11/29/2020  Glucose 70 - 99 mg/dL 145(H)  BUN 8 - 23 mg/dL 9  Creatinine 0.44 - 1.00 mg/dL 0.75  Sodium 135 - 145 mmol/L 133(L)  Potassium 3.5 - 5.1 mmol/L 3.1(L)  Chloride 98 - 111 mmol/L 95(L)  CO2 22 - 32 mmol/L 24  Calcium 8.9 - 10.3 mg/dL 9.4  Total Protein 6.5 - 8.1 g/dL 7.4  Total Bilirubin 0.3 - 1.2 mg/dL 0.8  Alkaline Phos 38 - 126 U/L 32(L)  AST 15 - 41 U/L 19  ALT 0 - 44 U/L 19   CBC Latest Ref Rng & Units 09/05/2020  WBC 4.0 - 10.5 K/uL 5.3  Hemoglobin 12.0 - 15.0 g/dL 11.7(L)  Hematocrit 36 - 46 % 36.3  Platelets 150 - 400 K/uL 388      Assessment and plan- Patient is a 74 y.o. 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  She is s/p adjuvant radiation therapy and here for routine follow-up of breast cancer on letrozole  Patient is tolerating letrozole well without any significant side effects along with calcium and vitamin D.Her bone density scan in June 2021 did show osteoporosis and patient is on Fosamax weekly.  We will plan to check a bone density scan every year and if there is any further worsening I will consider switching her to tamoxifen.  Left breast seroma: She will be seeing Dr. Adora Fridge in 3 days time.  I will see her back in 4 months with CBC with differential and CMP   Visit Diagnosis 1. Malignant neoplasm of left female breast, unspecified estrogen receptor status, unspecified site of breast ([place])   2. Use of letrozole (Femara)      Dr. Randa Evens, MD, MPH [facility] at [facility] [phone] 12/04/2020 9:34 AM

## 2020-12-05 DIAGNOSIS — C50912 Malignant neoplasm of unspecified site of left female breast: Secondary | ICD-10-CM

## 2020-12-05 NOTE — Addendum Note (Signed)
Addended by: Magdalene Patricia B on: 12/05/2020 04:00 PM   Modules accepted: Orders

## 2020-12-05 NOTE — Progress Notes (Signed)
Survivorship Care Plan visit completed.  Treatment summary reviewed and given to patient.  ASCO answers booklet reviewed and given to patient.  CARE program and Cancer Transitions discussed with patient along with other resources cancer center offers to patients and caregivers.  Patient verbalized understanding.    

## 2020-12-11 ENCOUNTER — Encounter: Payer: Self-pay | Admitting: Hospice and Palliative Medicine

## 2020-12-11 ENCOUNTER — Ambulatory Visit (INDEPENDENT_AMBULATORY_CARE_PROVIDER_SITE_OTHER): Payer: Medicare Other | Admitting: Hospice and Palliative Medicine

## 2020-12-11 ENCOUNTER — Other Ambulatory Visit: Payer: Self-pay

## 2020-12-11 VITALS — BP 136/72 | HR 88 | Temp 99.0°F | Resp 16 | Ht 64.0 in | Wt 141.8 lb

## 2020-12-11 DIAGNOSIS — R300 Dysuria: Secondary | ICD-10-CM | POA: Diagnosis not present

## 2020-12-11 DIAGNOSIS — C50512 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 breast: Secondary | ICD-10-CM

## 2020-12-11 DIAGNOSIS — N200 Calculus of kidney: Secondary | ICD-10-CM

## 2020-12-11 DIAGNOSIS — I10 Essential (primary) hypertension: Secondary | ICD-10-CM

## 2020-12-11 LAB — POCT URINALYSIS DIPSTICK
Bilirubin, UA: NEGATIVE
Glucose, UA: NEGATIVE
Ketones, UA: NEGATIVE
Leukocytes, UA: NEGATIVE
Nitrite, UA: NEGATIVE
Protein, UA: NEGATIVE
Spec Grav, UA: 1.01 (ref 1.010–1.025)
Urobilinogen, UA: 0.2 E.U./dL
pH, UA: 5 (ref 5.0–8.0)

## 2020-12-11 NOTE — Progress Notes (Signed)
[facility] [place], [place] [address]  Internal MEDICINE  Office Visit Note  Patient Name: Virginia Warner  [id number]  [id number]  Date of Service: 12/13/2020  Chief Complaint  Patient presents with  . Follow-up  . Hypertension  . Hyperlipidemia  . Urinary Tract Infection    Checked urine, seeing blood when urinating.     HPI Patient is here for routine follow-up She has recently completed radiation, s/p lumpectomy 07/10/20, started on Letrozole last month Reports she is doing well, surgical incision healing well with no complications, tolerated radiation well and no issues since starting Letrozole  Prior to undergoing treatment, she was seen in our office in June for persistent hematuria, renal US June 4th revealed two non-obstructing stones and complicated cyst of left kidney--was referred to urology at that time, did not keep appointment with urology due to diagnosis of breast cancer and undergoing treatments  Since June, she has continued to have intermittent hematuria as well as left sided low back pain Denies symptoms of increased frequency or burning with urination Blood is present on toilet paper when she wipes after urination and she has noticed a few drops in the toilet with urination   Current Medication: Outpatient Encounter Medications as of 12/11/2020  Medication Sig Note  . acetaminophen (TYLENOL) 500 MG tablet Take 500 mg by mouth daily as needed for moderate pain.    Marland Kitchen alendronate (FOSAMAX) 70 MG tablet Take 1 tablet (70 mg total) by mouth once a week. Take with a full glass of water on an empty stomach.   Marland Kitchen amLODipine (NORVASC) 5 MG tablet Take 1 tablet (5 mg total) by mouth daily.   Marland Kitchen aspirin EC 81 MG tablet Take 81 mg by mouth daily.    . fexofenadine (ALLEGRA) 180 MG tablet Take 180 mg by mouth daily.   . fluticasone (FLONASE) 50 MCG/ACT nasal spray Place 2 sprays into both nostrils daily.   Marland Kitchen latanoprost (XALATAN) 0.005 %  ophthalmic solution Place 1 drop into both eyes daily.  06/26/2020: Pt states she uses whenever not always at night  . letrozole (FEMARA) 2.5 MG tablet ONE TABLET DAILY DO NOT START UNTIL COMPLETED RADIATION WITH 3 OR 4 DAY BREAK AFTER RADIATION THEN START   . lisinopril-hydrochlorothiazide (ZESTORETIC) 20-12.5 MG tablet Take 1 tablet by mouth 2 (two) times daily.   Marland Kitchen loratadine (CLARITIN) 10 MG tablet Take 10 mg by mouth daily.   . Multiple Vitamin (MULTIVITAMIN WITH MINERALS) TABS tablet Take 1 tablet by mouth daily.  06/26/2020: Pt ran out, needs to get more  . potassium chloride SA (KLOR-CON) 20 MEQ tablet Take 1 tablet (20 mEq total) by mouth 2 (two) times daily for 14 days.    No facility-administered encounter medications on file as of 12/11/2020.    Surgical History: Past Surgical History:  Procedure Laterality Date  . ABDOMINAL HYSTERECTOMY     partial  . BREAST BIOPSY Left 06/12/2020   [place] Bx, Ribbon Clip, path pending   . BREAST LUMPECTOMY,RADIO FREQ LOCALIZER,AXILLARY SENTINEL LYMPH NODE BIOPSY Left 07/10/2020   Procedure: BREAST LUMPECTOMY,RADIO FREQ LOCALIZER,AXILLARY SENTINEL LYMPH NODE BIOPSY;  Surgeon: Jules Husbands, MD;  Location: [facility] ORS;  Service: General;  Laterality: Left;  . COLONOSCOPY WITH PROPOFOL N/A 10/18/2015   Procedure: COLONOSCOPY WITH PROPOFOL;  Surgeon: Manya Silvas, MD;  Location: [facility] ENDOSCOPY;  Service: Endoscopy;  Laterality: N/A;  . INGUINAL HERNIA REPAIR     age 8's  . POLYPECTOMY  Medical History: Past Medical History:  Diagnosis Date  . Allergy   . Arthritis   . Breast cancer ([place])   . Hyperlipidemia   . Hypertension   . Seasonal allergies     Family History: Family History  Problem Relation Age of Onset  . Cancer Brother   . Breast cancer Paternal Grandmother     Social History   Socioeconomic History  . Marital status: Divorced    Spouse name: Not on file  . Number of children: Not on file  . Years of education: Not  on file  . Highest education level: Not on file  Occupational History  . Not on file  Tobacco Use  . Smoking status: Former Smoker    Quit date: 11/2007    Years since quitting: 13.0  . Smokeless tobacco: Never Used  Vaping Use  . Vaping Use: Never used  Substance and Sexual Activity  . Alcohol use: Not Currently    Alcohol/week: 0.0 standard drinks  . Drug use: No  . Sexual activity: Not Currently  Other Topics Concern  . Not on file  Social History Narrative  . Not on file   Social Determinants of Health   Financial Resource Strain: Not on file  Food Insecurity: Not on file  Transportation Needs: Not on file  Physical Activity: Not on file  Stress: Not on file  Social Connections: Not on file  Intimate Partner Violence: Not on file      Review of Systems  Constitutional: Negative for chills, diaphoresis and fatigue.  HENT: Negative for ear pain, postnasal drip and sinus pressure.   Eyes: Negative for photophobia, discharge, redness, itching and visual disturbance.  Respiratory: Negative for cough, shortness of breath and wheezing.   Cardiovascular: Negative for chest pain, palpitations and leg swelling.  Gastrointestinal: Negative for abdominal pain, constipation, diarrhea, nausea and vomiting.  Genitourinary: Positive for flank pain and hematuria. Negative for dysuria.  Musculoskeletal: Negative for arthralgias, back pain, gait problem and neck pain.  Skin: Negative for color change.  Allergic/Immunologic: Negative for environmental allergies and food allergies.  Neurological: Negative for dizziness and headaches.  Hematological: Does not bruise/bleed easily.  Psychiatric/Behavioral: Negative for agitation, behavioral problems (depression) and hallucinations.    Vital Signs: BP 136/72   Pulse 88   Temp 99 F (37.2 C)   Resp 16   Ht 5\' 4"  (1.626 m)   Wt 141 lb 12.8 oz (64.3 kg)   SpO2 98%   BMI 24.34 kg/m    Physical Exam Vitals reviewed.   Constitutional:      Appearance: Normal appearance. She is normal weight.  Cardiovascular:     Rate and Rhythm: Normal rate and regular rhythm.     Pulses: Normal pulses.     Heart sounds: Normal heart sounds.  Pulmonary:     Effort: Pulmonary effort is normal.     Breath sounds: Normal breath sounds.  Abdominal:     General: Abdomen is flat.     Tenderness: There is right CVA tenderness and left CVA tenderness.  Musculoskeletal:        General: Normal range of motion.     Cervical back: Normal range of motion.  Skin:    General: Skin is warm.  Neurological:     General: No focal deficit present.     Mental Status: She is alert and oriented to person, place, and time. Mental status is at baseline.  Psychiatric:        Mood and  Affect: Mood normal.        Behavior: Behavior normal.        Thought Content: Thought content normal.        Judgment: Judgment normal.    Assessment/Plan 1. Renal calculus or stone Renal US June 2021, 2 small non-obstructing stones left kidney as well as complicated cyst Continues to have symptoms likely related to stones Referral to urology for further evaluation and management - Ambulatory referral to Urology  2. Essential hypertension BP and HR well controlled today on current therapy, continue to monitor  3. 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 breast, unspecified estrogen receptor status ([place]) S/p lumpectomy as well as radiation, recently started on PO chemotherapy, tolerating medication, continue regular follow-up with oncologist, will continue to follow along  4. Dysuria Urine without evidence of infection from sample in office today - POCT Urinalysis Dipstick - CULTURE, URINE COMPREHENSIVE  General Counseling: Sandy Salaam understanding of the findings of todays visit and agrees with plan of treatment. I have discussed any further diagnostic evaluation that may be needed or ordered today. We also reviewed her  medications today. she has been encouraged to call the office with any questions or concerns that should arise related to todays visit.    Orders Placed This Encounter  Procedures  . CULTURE, URINE COMPREHENSIVE  . Ambulatory referral to Urology  . POCT Urinalysis Dipstick    Time spent: 30 Minutes Time spent includes review of chart, medications, test results and follow-up plan with the patient.  This patient was seen by Theodoro Grist AGNP-C in Collaboration with Dr [name] as a part of collaborative care agreement     Tanna Furry. Champion Corales AGNP-C Internal medicine

## 2020-12-13 ENCOUNTER — Encounter: Payer: Self-pay | Admitting: Hospice and Palliative Medicine

## 2020-12-17 NOTE — Progress Notes (Signed)
Only 1,000 colonies present, does not require treatment at this time.

## 2020-12-19 LAB — CULTURE, URINE COMPREHENSIVE

## 2020-12-31 ENCOUNTER — Other Ambulatory Visit: Payer: Self-pay | Admitting: Oncology

## 2020-12-31 ENCOUNTER — Ambulatory Visit: Payer: Self-pay | Admitting: Urology

## 2021-01-03 ENCOUNTER — Ambulatory Visit: Payer: Medicare Other | Admitting: Urology

## 2021-01-08 ENCOUNTER — Encounter: Payer: Self-pay | Admitting: Nurse Practitioner

## 2021-01-08 ENCOUNTER — Ambulatory Visit: Payer: Medicare Other | Admitting: Nurse Practitioner

## 2021-01-08 ENCOUNTER — Ambulatory Visit (INDEPENDENT_AMBULATORY_CARE_PROVIDER_SITE_OTHER): Payer: Medicare Other | Admitting: Nurse Practitioner

## 2021-01-08 VITALS — BP 144/83 | HR 99 | Temp 97.5°F | Resp 16 | Ht 60.0 in | Wt 141.4 lb

## 2021-01-08 DIAGNOSIS — I10 Essential (primary) hypertension: Secondary | ICD-10-CM

## 2021-01-08 DIAGNOSIS — R7301 Impaired fasting glucose: Secondary | ICD-10-CM | POA: Diagnosis not present

## 2021-01-08 DIAGNOSIS — E876 Hypokalemia: Secondary | ICD-10-CM | POA: Diagnosis not present

## 2021-01-08 NOTE — Progress Notes (Signed)
[facility] [place], [place] [address]  Internal MEDICINE  Office Visit Note  Patient Name: Virginia Warner  [id number]  [id number]  Date of Service: 01/19/2021  Chief Complaint  Patient presents with  . Follow-up  . Hyperlipidemia  . Hypertension  . Sinusitis    The patient is here for follow up visit.  -blood pressure is better than it has been in some time. She has been monitoring this at home. Blood pressure at home is generally running in the 120s/70s.  -has been having some increased levels of anxiety. When she gets up and does something, she states that it goes away.  -did have an episode of hematuria and was seen in office. She does have history of 11mm stone on let side. She is scheduled to see urology for this on 01/16/2021. She states that this has resolved for now.  -occasional frontal sinus headaches.  Recent labs checked at oncology showed potassium 3.1. took thirty days of oral potassium supplements. Has finished this now. Also noted that blood sugar was 143. Will recheck both and check HgbA1c as well.       Current Medication: Outpatient Encounter Medications as of 01/08/2021  Medication Sig Note  . acetaminophen (TYLENOL) 500 MG tablet Take 500 mg by mouth daily as needed for moderate pain.    Marland Kitchen alendronate (FOSAMAX) 70 MG tablet Take 1 tablet (70 mg total) by mouth once a week. Take with a full glass of water on an empty stomach.   Marland Kitchen amLODipine (NORVASC) 5 MG tablet Take 1 tablet (5 mg total) by mouth daily.   Marland Kitchen aspirin EC 81 MG tablet Take 81 mg by mouth daily.    . fexofenadine (ALLEGRA) 180 MG tablet Take 180 mg by mouth daily.   . fluticasone (FLONASE) 50 MCG/ACT nasal spray Place 2 sprays into both nostrils daily.   Marland Kitchen latanoprost (XALATAN) 0.005 % ophthalmic solution Place 1 drop into both eyes daily.  06/26/2020: Pt states she uses whenever not always at night  . letrozole (FEMARA) 2.5 MG tablet ONE TABLET DAILY DO NOT START UNTIL  COMPLETED RADIATION WITH 3 OR 4 DAY BREAK AFTER RADIATION THEN START   . lisinopril-hydrochlorothiazide (ZESTORETIC) 20-12.5 MG tablet Take 1 tablet by mouth 2 (two) times daily.   Marland Kitchen loratadine (CLARITIN) 10 MG tablet Take 10 mg by mouth daily.   . Multiple Vitamin (MULTIVITAMIN WITH MINERALS) TABS tablet Take 1 tablet by mouth daily.  06/26/2020: Pt ran out, needs to get more  . potassium chloride SA (KLOR-CON) 20 MEQ tablet Take 1 tablet (20 mEq total) by mouth 2 (two) times daily for 14 days.    No facility-administered encounter medications on file as of 01/08/2021.    Surgical History: Past Surgical History:  Procedure Laterality Date  . ABDOMINAL HYSTERECTOMY     partial  . BREAST BIOPSY Left 06/12/2020   [place] Bx, Ribbon Clip, path pending   . BREAST LUMPECTOMY,RADIO FREQ LOCALIZER,AXILLARY SENTINEL LYMPH NODE BIOPSY Left 07/10/2020   Procedure: BREAST LUMPECTOMY,RADIO FREQ LOCALIZER,AXILLARY SENTINEL LYMPH NODE BIOPSY;  Surgeon: Jules Husbands, MD;  Location: [facility] ORS;  Service: General;  Laterality: Left;  . COLONOSCOPY WITH PROPOFOL N/A 10/18/2015   Procedure: COLONOSCOPY WITH PROPOFOL;  Surgeon: Manya Silvas, MD;  Location: [facility] ENDOSCOPY;  Service: Endoscopy;  Laterality: N/A;  . INGUINAL HERNIA REPAIR     age 72's  . POLYPECTOMY      Medical History: Past Medical History:  Diagnosis Date  .  Allergy   . Arthritis   . Breast cancer ([place])   . Hyperlipidemia   . Hypertension   . Seasonal allergies     Family History: Family History  Problem Relation Age of Onset  . Cancer Brother   . Breast cancer Paternal Grandmother     Social History   Socioeconomic History  . Marital status: Divorced    Spouse name: Not on file  . Number of children: Not on file  . Years of education: Not on file  . Highest education level: Not on file  Occupational History  . Not on file  Tobacco Use  . Smoking status: Former Smoker    Quit date: 11/2007    Years since quitting:  13.1  . Smokeless tobacco: Never Used  Vaping Use  . Vaping Use: Never used  Substance and Sexual Activity  . Alcohol use: Not Currently    Alcohol/week: 0.0 standard drinks  . Drug use: No  . Sexual activity: Not Currently  Other Topics Concern  . Not on file  Social History Narrative  . Not on file   Social Determinants of Health   Financial Resource Strain: Not on file  Food Insecurity: Not on file  Transportation Needs: Not on file  Physical Activity: Not on file  Stress: Not on file  Social Connections: Not on file  Intimate Partner Violence: Not on file      Review of Systems  Constitutional: Positive for fatigue. Negative for activity change, chills and unexpected weight change.  HENT: Negative for congestion, postnasal drip, rhinorrhea, sneezing and sore throat.   Respiratory: Negative for cough, chest tightness and shortness of breath.   Cardiovascular: Negative for chest pain and palpitations.       Mildy elevated blood pressure today  Gastrointestinal: Negative for abdominal pain, constipation, diarrhea, nausea and vomiting.  Endocrine: Negative for cold intolerance, heat intolerance, polydipsia and polyuria.  Musculoskeletal: Negative for arthralgias, back pain, joint swelling and neck pain.  Skin: Negative for rash.  Neurological: Negative for dizziness, tremors, numbness and headaches.  Hematological: Negative for adenopathy. Does not bruise/bleed easily.  Psychiatric/Behavioral: Negative for behavioral problems (Depression), sleep disturbance and suicidal ideas. The patient is nervous/anxious.     Today's Vitals   01/08/21 1510  BP: (!) 144/83  Pulse: 99  Resp: 16  Temp: (!) 97.5 F (36.4 C)  SpO2: 95%  Weight: 141 lb 6.4 oz (64.1 kg)  Height: 5' (1.524 m)   Body mass index is 27.62 kg/m.  Physical Exam Vitals and nursing note reviewed.  Constitutional:      General: She is not in acute distress.    Appearance: Normal appearance. She is  well-developed and well-nourished. She is not diaphoretic.  HENT:     Head: Normocephalic and atraumatic.     Mouth/Throat:     Mouth: Oropharynx is clear and moist.     Pharynx: No oropharyngeal exudate.  Eyes:     Extraocular Movements: EOM normal.     Pupils: Pupils are equal, round, and reactive to light.  Neck:     Thyroid: No thyromegaly.     Vascular: No carotid bruit or JVD.     Trachea: No tracheal deviation.  Cardiovascular:     Rate and Rhythm: Normal rate and regular rhythm.     Heart sounds: Normal heart sounds. No murmur heard. No friction rub. No gallop.   Pulmonary:     Effort: Pulmonary effort is normal. No respiratory distress.  Breath sounds: Normal breath sounds. No wheezing or rales.  Chest:     Chest wall: No tenderness.  Abdominal:     Palpations: Abdomen is soft.  Musculoskeletal:        General: Normal range of motion.     Cervical back: Normal range of motion and neck supple.  Lymphadenopathy:     Cervical: No cervical adenopathy.  Skin:    General: Skin is warm and dry.  Neurological:     Mental Status: She is alert and oriented to person, place, and time.     Cranial Nerves: No cranial nerve deficit.  Psychiatric:        Mood and Affect: Mood and affect and mood normal.        Behavior: Behavior normal.        Thought Content: Thought content normal.        Judgment: Judgment normal.    Assessment/Plan: 1. Essential hypertension Generally stable. Continue bp medication as prescribed   2. Hypokalemia Check BMP and treat as indicated.   3. Impaired fasting glucose Check glucose and HgbA1c   General Counseling: Sandy Salaam understanding of the findings of todays visit and agrees with plan of treatment. I have discussed any further diagnostic evaluation that may be needed or ordered today. We also reviewed her medications today. she has been encouraged to call the office with any questions or concerns that should arise related to  todays visit.   This patient was seen by Leretha Pol FNP Collaboration with Dr [name] as a part of collaborative care agreement  Total time spent: 25 Minutes   Time spent includes review of chart, medications, test results, and follow up plan with the patient.      Dr [name] Internal medicine

## 2021-01-16 ENCOUNTER — Encounter: Payer: Self-pay | Admitting: Urology

## 2021-01-16 ENCOUNTER — Other Ambulatory Visit
Admission: RE | Admit: 2021-01-16 | Discharge: 2021-01-16 | Disposition: A | Discharge status: Home / Self Care | Payer: Medicare Other | Attending: Nurse Practitioner | Admitting: Nurse Practitioner

## 2021-01-16 ENCOUNTER — Other Ambulatory Visit: Payer: Self-pay

## 2021-01-16 ENCOUNTER — Ambulatory Visit (INDEPENDENT_AMBULATORY_CARE_PROVIDER_SITE_OTHER): Payer: Medicare Other | Admitting: Urology

## 2021-01-16 ENCOUNTER — Ambulatory Visit: Payer: Medicare Other | Admitting: Urology

## 2021-01-16 VITALS — BP 161/92 | HR 114 | Ht 60.0 in | Wt 141.0 lb

## 2021-01-16 DIAGNOSIS — C50912 Malignant neoplasm of unspecified site of left female breast: Secondary | ICD-10-CM

## 2021-01-16 DIAGNOSIS — R310 Gross hematuria: Secondary | ICD-10-CM | POA: Diagnosis not present

## 2021-01-16 DIAGNOSIS — N200 Calculus of kidney: Secondary | ICD-10-CM | POA: Diagnosis not present

## 2021-01-16 DIAGNOSIS — R7301 Impaired fasting glucose: Secondary | ICD-10-CM | POA: Insufficient documentation

## 2021-01-16 DIAGNOSIS — E876 Hypokalemia: Secondary | ICD-10-CM | POA: Insufficient documentation

## 2021-01-16 DIAGNOSIS — Z86010 Personal history of colonic polyps: Secondary | ICD-10-CM | POA: Diagnosis not present

## 2021-01-16 LAB — COMPREHENSIVE METABOLIC PANEL
ALT: 17 U/L (ref 0–44)
AST: 18 U/L (ref 15–41)
Albumin: 4.2 g/dL (ref 3.5–5.0)
Alkaline Phosphatase: 36 U/L — ABNORMAL LOW (ref 38–126)
Anion gap: 9 (ref 5–15)
BUN: 10 mg/dL (ref 8–23)
CO2: 29 mmol/L (ref 22–32)
Calcium: 9.6 mg/dL (ref 8.9–10.3)
Chloride: 101 mmol/L (ref 98–111)
Creatinine, Ser: 0.6 mg/dL (ref 0.44–1.00)
GFR, Estimated: >60 mL/min (ref >60)
Glucose, Bld: 118 mg/dL — ABNORMAL HIGH (ref 70–99)
Potassium: 3.5 mmol/L (ref 3.5–5.1)
Sodium: 139 mmol/L (ref 135–145)
Total Bilirubin: 0.8 mg/dL (ref 0.3–1.2)
Total Protein: 7.6 g/dL (ref 6.5–8.1)

## 2021-01-16 LAB — CBC WITH DIFFERENTIAL/PLATELET
Abs Immature Granulocytes: 0.01 10*3/uL (ref 0.00–0.07)
Basophils Absolute: 0.1 10*3/uL (ref 0.0–0.1)
Basophils Relative: 2 %
Eosinophils Absolute: 0.5 10*3/uL (ref 0.0–0.5)
Eosinophils Relative: 11 %
HCT: 39.8 % (ref 36.0–46.0)
Hemoglobin: 12.9 g/dL (ref 12.0–15.0)
Immature Granulocytes: 0 %
Lymphocytes Relative: 38 %
Lymphs Abs: 1.9 10*3/uL (ref 0.7–4.0)
MCH: 27.5 pg (ref 26.0–34.0)
MCHC: 32.4 g/dL (ref 30.0–36.0)
MCV: 84.9 fL (ref 80.0–100.0)
Monocytes Absolute: 0.5 10*3/uL (ref 0.1–1.0)
Monocytes Relative: 10 %
Neutro Abs: 2 10*3/uL (ref 1.7–7.7)
Neutrophils Relative %: 39 %
Platelets: 375 10*3/uL (ref 150–400)
RBC: 4.69 MIL/uL (ref 3.87–5.11)
RDW: 13.3 % (ref 11.5–15.5)
WBC: 5 10*3/uL (ref 4.0–10.5)
nRBC: 0 % (ref 0.0–0.2)

## 2021-01-16 LAB — HEMOGLOBIN A1C
Hgb A1c MFr Bld: 5.8 % — ABNORMAL HIGH (ref 4.8–5.6)
Mean Plasma Glucose: 119.76 mg/dL

## 2021-01-16 NOTE — Progress Notes (Signed)
01/16/2021 8:44 AM   Virginia Warner October 06, 1946 [id number]  Referring provider: Luiz Ochoa, NP [place],  [place] [address]  Chief Complaint  Patient presents with  . Nephrolithiasis    HPI: 75 y.o. female referred for evaluation of gross hematuria and nephrolithiasis.   ~May 2021 noted blood spotting on underwear and had an episode of total gross painless hematuria  Saw her PCP and UA showed large blood on dipstick; treated empirically with Keflex; urine culture negative  Renal ultrasound was ordered which was felt to show 8 and 11 mm left renal calculi and a small left renal cyst  Urology referral placed however it was also diagnosed with breast cancer around that time and did not keep appointment  Has noted intermittent spotting since that time  Recurrent episode of total gross painless hematuria associated with mild dysuria December 2021  Urine culture grew 1000 colonies E. coli   PMH: Past Medical History:  Diagnosis Date  . Allergy   . Arthritis   . Breast cancer ([place])   . Hyperlipidemia   . Hypertension   . Seasonal allergies     Surgical History: Past Surgical History:  Procedure Laterality Date  . ABDOMINAL HYSTERECTOMY     partial  . BREAST BIOPSY Left 06/12/2020   [place] Bx, Ribbon Clip, path pending   . BREAST LUMPECTOMY,RADIO FREQ LOCALIZER,AXILLARY SENTINEL LYMPH NODE BIOPSY Left 07/10/2020   Procedure: BREAST LUMPECTOMY,RADIO FREQ LOCALIZER,AXILLARY SENTINEL LYMPH NODE BIOPSY;  Surgeon: Jules Husbands, MD;  Location: [facility] ORS;  Service: General;  Laterality: Left;  . COLONOSCOPY WITH PROPOFOL N/A 10/18/2015   Procedure: COLONOSCOPY WITH PROPOFOL;  Surgeon: Manya Silvas, MD;  Location: [facility] ENDOSCOPY;  Service: Endoscopy;  Laterality: N/A;  . INGUINAL HERNIA REPAIR     age 14's  . POLYPECTOMY      Home Medications:  Allergies as of 01/16/2021      Reactions   Aspirin Nausea And Vomiting   Cannot tolerate unless enteric  coated   Atorvastatin Other (See Comments)   Bone pain      Medication List       Accurate as of January 16, 2021  8:44 AM. If you have any questions, ask your nurse or doctor.        acetaminophen 500 MG tablet Commonly known as: TYLENOL Take 500 mg by mouth daily as needed for moderate pain.   alendronate 70 MG tablet Commonly known as: FOSAMAX Take 1 tablet (70 mg total) by mouth once a week. Take with a full glass of water on an empty stomach.   amLODipine 5 MG tablet Commonly known as: NORVASC Take 1 tablet (5 mg total) by mouth daily.   aspirin EC 81 MG tablet Take 81 mg by mouth daily.   fexofenadine 180 MG tablet Commonly known as: ALLEGRA Take 180 mg by mouth daily.   fluticasone 50 MCG/ACT nasal spray Commonly known as: FLONASE Place 2 sprays into both nostrils daily.   latanoprost 0.005 % ophthalmic solution Commonly known as: XALATAN Place 1 drop into both eyes daily.   letrozole 2.5 MG tablet Commonly known as: FEMARA ONE TABLET DAILY DO NOT START UNTIL COMPLETED RADIATION WITH 3 OR 4 DAY BREAK AFTER RADIATION THEN START   lisinopril-hydrochlorothiazide 20-12.5 MG tablet Commonly known as: ZESTORETIC Take 1 tablet by mouth 2 (two) times daily.   loratadine 10 MG tablet Commonly known as: CLARITIN Take 10 mg by mouth daily.   multivitamin with minerals Tabs tablet  Take 1 tablet by mouth daily.   potassium chloride SA 20 MEQ tablet Commonly known as: KLOR-CON Take 1 tablet (20 mEq total) by mouth 2 (two) times daily for 14 days.       Allergies:  Allergies  Allergen Reactions  . Aspirin Nausea And Vomiting    Cannot tolerate unless enteric coated  . Atorvastatin Other (See Comments)    Bone pain    Family History: Family History  Problem Relation Age of Onset  . Cancer Brother   . Breast cancer Paternal Grandmother     Social History:  reports that she quit smoking about 13 years ago. She has never used smokeless tobacco. She  reports previous alcohol use. She reports that she does not use drugs.   Physical Exam: There were no vitals taken for this visit.  Constitutional:  Alert, No acute distress. HEENT: [place] AT, moist mucus membranes.  Trachea midline, no masses. Cardiovascular: No clubbing, cyanosis, or edema. Respiratory: Normal respiratory effort, no increased work of breathing. GI: Abdomen is soft, nontender, nondistended, no abdominal masses GU: No CVA tenderness Skin: No rashes, bruises or suspicious lesions. Neurologic: Grossly intact, no focal deficits, moving all 4 extremities. Psychiatric: Normal mood and affect.  Pertinent Imaging: Ultrasound images were not available for review  Assessment & Plan:    1. Gross hematuria  AUA risk stratification: High  We discussed the standard recommended evaluation for high risk hematuria to include CT urogram and cystoscopy  The procedures were discussed in detail and she has elected to proceed   Abbie Sons, MD  [facility] [address], [place], [place] [address] [phone]

## 2021-01-19 DIAGNOSIS — E876 Hypokalemia: Secondary | ICD-10-CM

## 2021-01-19 DIAGNOSIS — R7301 Impaired fasting glucose: Secondary | ICD-10-CM | POA: Insufficient documentation

## 2021-01-19 HISTORY — DX: Hypokalemia: E87.6

## 2021-01-28 ENCOUNTER — Other Ambulatory Visit: Payer: Self-pay

## 2021-01-28 DIAGNOSIS — I10 Essential (primary) hypertension: Secondary | ICD-10-CM

## 2021-01-28 MED ORDER — AMLODIPINE BESYLATE 5 MG PO TABS: 5.0000 mg | ORAL_TABLET | Freq: Every day | ORAL | 5 refills | Status: DC

## 2021-02-07 ENCOUNTER — Other Ambulatory Visit: Payer: Self-pay

## 2021-02-07 DIAGNOSIS — M810 Age-related osteoporosis without current pathological fracture: Secondary | ICD-10-CM

## 2021-02-07 MED ORDER — ALENDRONATE SODIUM 70 MG PO TABS: 70.0000 mg | ORAL_TABLET | ORAL | 3 refills | Status: DC

## 2021-02-18 ENCOUNTER — Other Ambulatory Visit: Payer: Self-pay | Admitting: Urology

## 2021-02-20 ENCOUNTER — Encounter: Payer: Self-pay | Admitting: Radiation Oncology

## 2021-02-21 ENCOUNTER — Ambulatory Visit
Admission: RE | Admit: 2021-02-21 | Discharge: 2021-02-21 | Disposition: A | Discharge status: Home / Self Care | Payer: Medicare Other | Source: Ambulatory Visit | Attending: Radiation Oncology | Admitting: Radiation Oncology

## 2021-02-21 VITALS — BP 154/96 | HR 103 | Temp 96.5°F | Wt 138.2 lb

## 2021-02-21 DIAGNOSIS — C50512 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 breast: Secondary | ICD-10-CM

## 2021-02-21 DIAGNOSIS — Z08 Encounter for follow-up examination after completed treatment for malignant neoplasm: Secondary | ICD-10-CM | POA: Diagnosis not present

## 2021-02-21 DIAGNOSIS — Z170 Estrogen receptor positive status [ER+]: Secondary | ICD-10-CM | POA: Diagnosis not present

## 2021-02-21 NOTE — Progress Notes (Signed)
Radiation Oncology Follow up Note  Name: Virginia Warner   Date:   02/21/2021 MRN:  [id number] DOB: 01-29-1946    This 75 y.o. 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  REFERRING PROVIDER: Ronnell [place], NP  HPI: Patient is a 75 year old female now out 5 months having completed whole breast radiation to her left breast for stage I (T1b N0 M0) ER positive invasive mammary carcinoma seen today in routine follow-up she is doing well she has some itching of her breast.  She had some swelling in her left axilla which has resolved.  She specifically denies otherwise breast tenderness cough or bone pain..  She has been having some hematuria she has scheduled cystoscopy and CT scan hematuria work-up the end of the month.  She does take daily aspirin which I have asked her to cut back on to 1 every other day.  COMPLICATIONS OF TREATMENT: none  FOLLOW UP COMPLIANCE: keeps appointments   PHYSICAL EXAM:  BP (!) 154/96   Pulse (!) 103   Temp (!) 96.5 F (35.8 C) (Tympanic)   Wt 138 lb 4 oz (62.7 kg)   BMI 27.00 kg/m  Lungs are clear to A&P cardiac examination essentially unremarkable with regular rate and rhythm. No dominant mass or nodularity is noted in either breast in 2 positions examined. Incision is well-healed. No axillary or supraclavicular adenopathy is appreciated. Cosmetic result is excellent.  Well-developed well-nourished patient in NAD. HEENT reveals PERLA, EOMI, discs not visualized.  Oral cavity is clear. No oral mucosal lesions are identified. Neck is clear without evidence of cervical or supraclavicular adenopathy. Lungs are clear to A&P. Cardiac examination is essentially unremarkable with regular rate and rhythm without murmur rub or thrill. Abdomen is benign with no organomegaly or masses noted. Motor sensory and DTR levels are equal and symmetric in the upper  and lower extremities. Cranial nerves II through XII are grossly intact. Proprioception is intact. No peripheral adenopathy or edema is identified. No motor or sensory levels are noted. Crude visual fields are within normal range.  RADIOLOGY RESULTS: No current films to review  PLAN: Present time patient is doing well recovering nicely from her whole breast radiation I suggested some Benadryl gel for her itching of the breast.  Otherwise she continues work-up with urology for some hematuria.  I have asked to see her back in 6 months for follow-up and then will go to once year follow-up visits.  Patient is to call with any concerns.  I would like to take this opportunity to thank you for allowing me to participate in the care of your patient.Noreene Filbert, MD

## 2021-02-25 ENCOUNTER — Ambulatory Visit: Admission: RE | Admit: 2021-02-25 | Payer: Medicare Other | Source: Ambulatory Visit

## 2021-02-27 ENCOUNTER — Other Ambulatory Visit: Payer: Medicare Other | Admitting: Urology

## 2021-03-05 ENCOUNTER — Other Ambulatory Visit: Payer: Self-pay

## 2021-03-05 ENCOUNTER — Ambulatory Visit
Admission: RE | Admit: 2021-03-05 | Discharge: 2021-03-05 | Disposition: A | Discharge status: Home / Self Care | Payer: Medicare Other | Source: Ambulatory Visit | Attending: Urology | Admitting: Urology

## 2021-03-05 DIAGNOSIS — N281 Cyst of kidney, acquired: Secondary | ICD-10-CM | POA: Diagnosis not present

## 2021-03-05 DIAGNOSIS — R310 Gross hematuria: Secondary | ICD-10-CM | POA: Diagnosis not present

## 2021-03-05 DIAGNOSIS — Z853 Personal history of malignant neoplasm of breast: Secondary | ICD-10-CM | POA: Diagnosis not present

## 2021-03-05 DIAGNOSIS — M439 Deforming dorsopathy, unspecified: Secondary | ICD-10-CM | POA: Diagnosis not present

## 2021-03-05 DIAGNOSIS — N200 Calculus of kidney: Secondary | ICD-10-CM | POA: Diagnosis not present

## 2021-03-05 LAB — POCT I-STAT CREATININE: Creatinine, Ser: 0.7 mg/dL (ref 0.44–1.00)

## 2021-03-05 MED ORDER — IOHEXOL 300 MG/ML  SOLN
125.0000 mL | Freq: Once | INTRAMUSCULAR | Status: AC | PRN
  Administered 2021-03-05: 125 mL via INTRAVENOUS

## 2021-03-06 ENCOUNTER — Ambulatory Visit (INDEPENDENT_AMBULATORY_CARE_PROVIDER_SITE_OTHER): Payer: Medicare Other | Admitting: Urology

## 2021-03-06 ENCOUNTER — Encounter: Payer: Self-pay | Admitting: Urology

## 2021-03-06 VITALS — BP 149/89 | HR 111 | Ht 64.0 in | Wt 138.0 lb

## 2021-03-06 DIAGNOSIS — R310 Gross hematuria: Secondary | ICD-10-CM

## 2021-03-06 DIAGNOSIS — N200 Calculus of kidney: Secondary | ICD-10-CM

## 2021-03-06 NOTE — Progress Notes (Signed)
With  03/06/21  CC:  Chief Complaint  Patient presents with  . Cysto    HPI: 75 y.o. female high risk hematuria presents today for CT review and cystoscopy.  Denies recurrent gross hematuria  Blood pressure (!) 149/89, pulse (!) 111, height 5\' 4"  (1.626 m), weight 138 lb (62.6 kg). NED. A&Ox3.   No respiratory distress   Abd soft, NT, ND Atrophic external genitalia with patent urethral meatus  Imaging:  CTU with nonobstructing left renal calculi.  Ureters not completely opacified but no evidence of obstruction   Cystoscopy Procedure Note  Patient identification was confirmed, informed consent was obtained, and patient was prepped using Betadine solution.  Lidocaine jelly was administered per urethral meatus.    Procedure: - Flexible cystoscope introduced, without any difficulty.   - Thorough search of the bladder revealed:    normal urethral meatus    normal urothelium    no stones    no ulcers     no tumors    no urethral polyps    no trabeculation  - Ureteral orifices were normal in position and appearance.  Post-Procedure: - Patient tolerated the procedure well  Assessment/ Plan:  No mucosal abnormalities on cystoscopy  Urine cytology sent  Nonobstructing left renal calculi  PA follow-up 6 months for KUB and repeat UA   Abbie Sons, MD

## 2021-03-07 LAB — URINALYSIS, COMPLETE
Bilirubin, UA: NEGATIVE
Glucose, UA: NEGATIVE
Ketones, UA: NEGATIVE
Nitrite, UA: NEGATIVE
Protein,UA: NEGATIVE
Specific Gravity, UA: 1.02 (ref 1.005–1.030)
Urobilinogen, Ur: 0.2 mg/dL (ref 0.2–1.0)
pH, UA: 6 (ref 5.0–7.5)

## 2021-03-07 LAB — MICROSCOPIC EXAMINATION

## 2021-03-08 ENCOUNTER — Telehealth: Payer: Self-pay | Admitting: *Deleted

## 2021-03-08 LAB — CYTOLOGY - NON PAP

## 2021-03-08 NOTE — Telephone Encounter (Signed)
-----   Message from Abbie Sons, MD sent at 03/08/2021  2:53 PM EST ----- Urine cytology showed no abnormal cells

## 2021-03-08 NOTE — Telephone Encounter (Signed)
Notified patient as instructed, Left message on voice mail

## 2021-03-14 ENCOUNTER — Encounter: Payer: Self-pay | Admitting: Hospice and Palliative Medicine

## 2021-03-14 ENCOUNTER — Other Ambulatory Visit: Payer: Self-pay

## 2021-03-14 ENCOUNTER — Ambulatory Visit (INDEPENDENT_AMBULATORY_CARE_PROVIDER_SITE_OTHER): Payer: Medicare Other | Admitting: Hospice and Palliative Medicine

## 2021-03-14 ENCOUNTER — Ambulatory Visit
Admission: RE | Admit: 2021-03-14 | Discharge: 2021-03-14 | Disposition: A | Discharge status: Home / Self Care | Payer: Medicare Other | Source: Ambulatory Visit | Attending: Hospice and Palliative Medicine | Admitting: Hospice and Palliative Medicine

## 2021-03-14 VITALS — BP 132/78 | HR 98 | Temp 97.4°F | Resp 16 | Ht 64.0 in | Wt 139.6 lb

## 2021-03-14 DIAGNOSIS — M25562 Pain in left knee: Secondary | ICD-10-CM | POA: Insufficient documentation

## 2021-03-14 DIAGNOSIS — M1712 Unilateral primary osteoarthritis, left knee: Secondary | ICD-10-CM | POA: Diagnosis not present

## 2021-03-14 DIAGNOSIS — G5793 Unspecified mononeuropathy of bilateral lower limbs: Secondary | ICD-10-CM

## 2021-03-14 MED ORDER — GABAPENTIN 100 MG PO CAPS: 100.0000 mg | ORAL_CAPSULE | Freq: Every day | ORAL | 0 refills | Status: DC

## 2021-03-14 NOTE — Progress Notes (Signed)
[facility] [place], [place] [address]  Internal MEDICINE  Office Visit Note  Patient Name: Virginia Warner  [id number]  [id number]  Date of Service: 03/15/2021  Chief Complaint  Patient presents with  . Acute Visit    Arthritis in left leg, yesterday pt felt pulse in right ankle      HPI Pt is here for a sick visit. Complaining of left knee pain, has had pain for several months but continues to worsen Also complaining of burning and tingling to bilateral lower extremities Has been taking OTC acetaminophen and ibuprofen which is somewhat helpful for pain control  History of breast cancer, currently on Letrozole daily  Current Medication:  Outpatient Encounter Medications as of 03/14/2021  Medication Sig Note  . gabapentin (NEURONTIN) 100 MG capsule Take 1 capsule (100 mg total) by mouth at bedtime.   Marland Kitchen acetaminophen (TYLENOL) 500 MG tablet Take 500 mg by mouth daily as needed for moderate pain.    Marland Kitchen alendronate (FOSAMAX) 70 MG tablet Take 1 tablet (70 mg total) by mouth once a week. Take with a full glass of water on an empty stomach.   Marland Kitchen amLODipine (NORVASC) 5 MG tablet Take 1 tablet (5 mg total) by mouth daily.   Marland Kitchen aspirin EC 81 MG tablet Take 81 mg by mouth daily.    . fexofenadine (ALLEGRA) 180 MG tablet Take 180 mg by mouth daily.   . fluticasone (FLONASE) 50 MCG/ACT nasal spray Place 2 sprays into both nostrils daily.   Marland Kitchen latanoprost (XALATAN) 0.005 % ophthalmic solution Place 1 drop into both eyes daily.  06/26/2020: Pt states she uses whenever not always at night  . letrozole (FEMARA) 2.5 MG tablet ONE TABLET DAILY DO NOT START UNTIL COMPLETED RADIATION WITH 3 OR 4 DAY BREAK AFTER RADIATION THEN START   . lisinopril-hydrochlorothiazide (ZESTORETIC) 20-12.5 MG tablet Take 1 tablet by mouth 2 (two) times daily.   Marland Kitchen loratadine (CLARITIN) 10 MG tablet Take 10 mg by mouth daily.   . Multiple Vitamin (MULTIVITAMIN WITH MINERALS) TABS tablet Take 1  tablet by mouth daily.  06/26/2020: Pt ran out, needs to get more  . [DISCONTINUED] potassium chloride SA (KLOR-CON) 20 MEQ tablet Take 1 tablet (20 mEq total) by mouth 2 (two) times daily for 14 days.    No facility-administered encounter medications on file as of 03/14/2021.      Medical History: Past Medical History:  Diagnosis Date  . Allergy   . Arthritis   . Breast cancer ([place])   . Hyperlipidemia   . Hypertension   . Seasonal allergies      Vital Signs: BP 132/78   Pulse 98   Temp (!) 97.4 F (36.3 C)   Resp 16   Ht 5\' 4"  (1.626 m)   Wt 139 lb 9.6 oz (63.3 kg)   SpO2 97%   BMI 23.96 kg/m    Review of Systems  Constitutional: Negative for chills, diaphoresis and fatigue.  HENT: Negative for ear pain, postnasal drip and sinus pressure.   Eyes: Negative for photophobia, discharge, redness, itching and visual disturbance.  Respiratory: Negative for cough, shortness of breath and wheezing.   Cardiovascular: Negative for chest pain, palpitations and leg swelling.  Gastrointestinal: Negative for abdominal pain, constipation, diarrhea, nausea and vomiting.  Genitourinary: Negative for dysuria and flank pain.  Musculoskeletal: Negative for arthralgias, back pain, gait problem and neck pain.       Left knee pain, pins and needles in bilateral  lower extremities  Skin: Negative for color change.  Allergic/Immunologic: Negative for environmental allergies and food allergies.  Neurological: Negative for dizziness and headaches.  Hematological: Does not bruise/bleed easily.  Psychiatric/Behavioral: Negative for agitation, behavioral problems (depression) and hallucinations.    Physical Exam Vitals reviewed.  Constitutional:      Appearance: Normal appearance. She is normal weight.  Cardiovascular:     Rate and Rhythm: Normal rate and regular rhythm.     Pulses: Normal pulses.     Heart sounds: Normal heart sounds.  Pulmonary:     Effort: Pulmonary effort is normal.      Breath sounds: Normal breath sounds.  Abdominal:     General: Abdomen is flat.     Palpations: Abdomen is soft.  Musculoskeletal:        General: Normal range of motion.     Cervical back: Normal range of motion.  Skin:    General: Skin is warm.  Neurological:     General: No focal deficit present.     Mental Status: She is alert and oriented to person, place, and time. Mental status is at baseline.  Psychiatric:        Mood and Affect: Mood normal.        Behavior: Behavior normal.        Thought Content: Thought content normal.        Judgment: Judgment normal.    Assessment/Plan: 1. Acute pain of left knee Review imaging and treat accordingly Continue with OTC acetaminophen, heat therapy and brace - DG Knee Complete 4 Views Left; Future  2. Neuropathy involving both lower extremities Start low dose gabapentin for treatment of symptoms, likely secondary to radiation and chemotherapy - gabapentin (NEURONTIN) 100 MG capsule; Take 1 capsule (100 mg total) by mouth at bedtime.  Dispense: 90 capsule; Refill: 0  General Counseling: Virginia Warner verbalizes understanding of the findings of todays visit and agrees with plan of treatment. I have discussed any further diagnostic evaluation that may be needed or ordered today. We also reviewed her medications today. she has been encouraged to call the office with any questions or concerns that should arise related to todays visit.   Orders Placed This Encounter  Procedures  . DG Knee Complete 4 Views Left    Meds ordered this encounter  Medications  . gabapentin (NEURONTIN) 100 MG capsule    Sig: Take 1 capsule (100 mg total) by mouth at bedtime.    Dispense:  90 capsule    Refill:  0    Time spent: 25 Minutes  This patient was seen by Theodoro Grist AGNP-C in Collaboration with Dr [name] as a part of collaborative care agreement.  Tanna Furry [facility] Internal Medicine

## 2021-03-15 ENCOUNTER — Encounter: Payer: Self-pay | Admitting: Hospice and Palliative Medicine

## 2021-03-18 ENCOUNTER — Other Ambulatory Visit: Payer: Self-pay | Admitting: Hospice and Palliative Medicine

## 2021-03-18 DIAGNOSIS — S82002A Unspecified fracture of left patella, initial encounter for closed fracture: Secondary | ICD-10-CM

## 2021-03-18 NOTE — Progress Notes (Signed)
Please call and let her know that there is evidence of possible fracture on xray of her knee--I am placing a referral to ortho for her. Thanks!

## 2021-03-20 ENCOUNTER — Other Ambulatory Visit: Payer: Self-pay

## 2021-03-22 ENCOUNTER — Telehealth: Payer: Self-pay

## 2021-03-22 NOTE — Telephone Encounter (Signed)
[facility] received referral. Patient has an appt on 04/03/21 @ 10:30 with Vance Peper. Loma Sousa

## 2021-03-22 NOTE — Telephone Encounter (Signed)
Faxed Orthopedic referral to [facility] on 03/22/21.Will follow up next week to ensure they receive. Loma Sousa

## 2021-03-26 ENCOUNTER — Other Ambulatory Visit: Payer: Self-pay | Admitting: Oncology

## 2021-03-26 DIAGNOSIS — M1712 Unilateral primary osteoarthritis, left knee: Secondary | ICD-10-CM | POA: Diagnosis not present

## 2021-03-26 DIAGNOSIS — M25562 Pain in left knee: Secondary | ICD-10-CM | POA: Diagnosis not present

## 2021-03-26 DIAGNOSIS — M5416 Radiculopathy, lumbar region: Secondary | ICD-10-CM | POA: Diagnosis not present

## 2021-03-28 ENCOUNTER — Ambulatory Visit: Payer: Medicare Other | Admitting: Hospice and Palliative Medicine

## 2021-03-31 ENCOUNTER — Other Ambulatory Visit: Payer: Self-pay | Admitting: *Deleted

## 2021-03-31 DIAGNOSIS — C50512 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 breast: Secondary | ICD-10-CM

## 2021-04-01 ENCOUNTER — Encounter: Payer: Self-pay | Admitting: Oncology

## 2021-04-01 ENCOUNTER — Inpatient Hospital Stay (HOSPITAL_BASED_OUTPATIENT_CLINIC_OR_DEPARTMENT_OTHER): Payer: Medicare Other | Admitting: Oncology

## 2021-04-01 ENCOUNTER — Other Ambulatory Visit: Payer: Self-pay

## 2021-04-01 ENCOUNTER — Inpatient Hospital Stay: Payer: Medicare Other | Attending: Oncology

## 2021-04-01 ENCOUNTER — Telehealth: Payer: Self-pay | Admitting: *Deleted

## 2021-04-01 VITALS — BP 143/80 | HR 108 | Temp 97.2°F | Resp 20 | Wt 137.5 lb

## 2021-04-01 DIAGNOSIS — C50512 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 breast: Secondary | ICD-10-CM

## 2021-04-01 DIAGNOSIS — Z170 Estrogen receptor positive status [ER+]: Secondary | ICD-10-CM | POA: Diagnosis not present

## 2021-04-01 DIAGNOSIS — Z08 Encounter for follow-up examination after completed treatment for malignant neoplasm: Secondary | ICD-10-CM

## 2021-04-01 DIAGNOSIS — I10 Essential (primary) hypertension: Secondary | ICD-10-CM | POA: Insufficient documentation

## 2021-04-01 DIAGNOSIS — E876 Hypokalemia: Secondary | ICD-10-CM

## 2021-04-01 DIAGNOSIS — C50912 Malignant neoplasm of unspecified site of left female breast: Secondary | ICD-10-CM | POA: Insufficient documentation

## 2021-04-01 DIAGNOSIS — Z923 Personal history of irradiation: Secondary | ICD-10-CM | POA: Insufficient documentation

## 2021-04-01 DIAGNOSIS — Z87891 Personal history of nicotine dependence: Secondary | ICD-10-CM | POA: Diagnosis not present

## 2021-04-01 DIAGNOSIS — Z79811 Long term (current) use of aromatase inhibitors: Secondary | ICD-10-CM

## 2021-04-01 DIAGNOSIS — E785 Hyperlipidemia, unspecified: Secondary | ICD-10-CM | POA: Diagnosis not present

## 2021-04-01 DIAGNOSIS — Z853 Personal history of malignant neoplasm of breast: Secondary | ICD-10-CM

## 2021-04-01 LAB — COMPREHENSIVE METABOLIC PANEL
ALT: 17 U/L (ref 0–44)
AST: 23 U/L (ref 15–41)
Albumin: 4.4 g/dL (ref 3.5–5.0)
Alkaline Phosphatase: 35 U/L — ABNORMAL LOW (ref 38–126)
Anion gap: 12 (ref 5–15)
BUN: 11 mg/dL (ref 8–23)
CO2: 27 mmol/L (ref 22–32)
Calcium: 9.3 mg/dL (ref 8.9–10.3)
Chloride: 97 mmol/L — ABNORMAL LOW (ref 98–111)
Creatinine, Ser: 0.72 mg/dL (ref 0.44–1.00)
GFR, Estimated: >60 mL/min (ref >60)
Glucose, Bld: 164 mg/dL — ABNORMAL HIGH (ref 70–99)
Potassium: 2.8 mmol/L — ABNORMAL LOW (ref 3.5–5.1)
Sodium: 136 mmol/L (ref 135–145)
Total Bilirubin: 0.6 mg/dL (ref 0.3–1.2)
Total Protein: 7.7 g/dL (ref 6.5–8.1)

## 2021-04-01 LAB — CBC WITH DIFFERENTIAL/PLATELET
Abs Immature Granulocytes: 0 10*3/uL (ref 0.00–0.07)
Basophils Absolute: 0.1 10*3/uL (ref 0.0–0.1)
Basophils Relative: 1 %
Eosinophils Absolute: 0.4 10*3/uL (ref 0.0–0.5)
Eosinophils Relative: 8 %
HCT: 39.2 % (ref 36.0–46.0)
Hemoglobin: 12.6 g/dL (ref 12.0–15.0)
Immature Granulocytes: 0 %
Lymphocytes Relative: 36 %
Lymphs Abs: 1.8 10*3/uL (ref 0.7–4.0)
MCH: 27.5 pg (ref 26.0–34.0)
MCHC: 32.1 g/dL (ref 30.0–36.0)
MCV: 85.6 fL (ref 80.0–100.0)
Monocytes Absolute: 0.5 10*3/uL (ref 0.1–1.0)
Monocytes Relative: 11 %
Neutro Abs: 2.2 10*3/uL (ref 1.7–7.7)
Neutrophils Relative %: 44 %
Platelets: 348 10*3/uL (ref 150–400)
RBC: 4.58 MIL/uL (ref 3.87–5.11)
RDW: 13.4 % (ref 11.5–15.5)
WBC: 5 10*3/uL (ref 4.0–10.5)
nRBC: 0 % (ref 0.0–0.2)

## 2021-04-01 MED ORDER — POTASSIUM CHLORIDE CRYS ER 20 MEQ PO TBCR: 20.0000 meq | EXTENDED_RELEASE_TABLET | Freq: Every day | ORAL | 0 refills | Status: DC

## 2021-04-01 NOTE — Telephone Encounter (Addendum)
Sent secure chat that pt had k-2.8 and sent in kdur 20 meq for 2 weeks and will re check labs  Then. If needed she may needd you help and monitoring. Dr Chancy Milroy secure chat back and said:Okay, thank you. will call her to be seen here in the office

## 2021-04-01 NOTE — Progress Notes (Signed)
Patient here for follow up she is being treated for arthritis and is scheduled for colonoscopy.

## 2021-04-01 NOTE — Progress Notes (Signed)
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 This was followed by diagnostic mammogram and ultrasound which showed a 7 x 7 x 4 mm mass 3 cm from the nipple at the 7 o'clock position. Normal-appearing left axillary lymph nodes. This was biopsied and was consistent with invasive mammary carcinoma with mucinous and micropapillary features.ER strongly positive greater than 90%, PR 1 to 10% positive and HER-2 negative  Patient underwent lumpectomy and sentinel lymph node biopsy on 07/10/2020. Final pathology showed 8 mm grade 1 invasive mucinous carcinoma with negative margins. Sentinel lymph nodes were negative for malignancy.  Patient completed adjuvant radiation therapy and started letrozole in November 2021  Interval history-reports that her hot flashes and fatigue has improved which she previously had when you started taking letrozole.  She reports ongoing left knee pain for which she underwent steroid injections.  Reports occasional self-limited pain in her shoulders as well.   Appetite and weight have remained stable.  ECOG PS- 1 Pain scale- 2   Review of systems- Review of Systems  Constitutional: Negative for chills, fever, malaise/fatigue and weight loss.  HENT: Negative for congestion, ear discharge and nosebleeds.   Eyes: Negative for blurred vision.  Respiratory: Negative for cough, hemoptysis, sputum production, shortness of breath and wheezing.   Cardiovascular: Negative for chest pain, palpitations, orthopnea and claudication.  Gastrointestinal: Negative for abdominal pain, blood in stool, constipation, diarrhea, heartburn, melena, nausea and vomiting.  Genitourinary: Negative for dysuria, flank pain, frequency, hematuria and urgency.  Musculoskeletal: Positive for joint pain. Negative for back pain and myalgias.  Skin: Negative for rash.  Neurological: Negative for dizziness, tingling, focal weakness, seizures, weakness and headaches.  Endo/Heme/Allergies: Does not bruise/bleed easily.  Psychiatric/Behavioral: Negative for depression and suicidal ideas. The patient does not have insomnia.        Allergies  Allergen Reactions  . Aspirin Nausea And Vomiting    Cannot tolerate unless enteric coated  . Atorvastatin Other (See Comments)    Bone pain     Past Medical History:  Diagnosis Date  . Allergy   . Arthritis   . Breast cancer ([place])   . Hyperlipidemia   . Hypertension   . Seasonal allergies      Past Surgical History:  Procedure Laterality Date  . ABDOMINAL HYSTERECTOMY     partial  . BREAST BIOPSY Left 06/12/2020   [place] Bx, Ribbon Clip, path pending   . BREAST LUMPECTOMY,RADIO FREQ LOCALIZER,AXILLARY SENTINEL LYMPH NODE BIOPSY Left 07/10/2020   Procedure: BREAST LUMPECTOMY,RADIO FREQ LOCALIZER,AXILLARY SENTINEL LYMPH NODE BIOPSY;  Surgeon: Jules Husbands, MD;  Location: [facility] ORS;  Service: General;  Laterality: Left;  . COLONOSCOPY WITH PROPOFOL N/A 10/18/2015   Procedure:  COLONOSCOPY WITH PROPOFOL;  Surgeon: Manya Silvas,  MD;  Location: [facility] ENDOSCOPY;  Service: Endoscopy;  Laterality: N/A;  . INGUINAL HERNIA REPAIR     age 73's  . POLYPECTOMY      Social History   Socioeconomic History  . Marital status: Divorced    Spouse name: Not on file  . Number of children: Not on file  . Years of education: Not on file  . Highest education level: Not on file  Occupational History  . Not on file  Tobacco Use  . Smoking status: Former Smoker    Quit date: 11/2007    Years since quitting: 13.3  . Smokeless tobacco: Never Used  Vaping Use  . Vaping Use: Never used  Substance and Sexual Activity  . Alcohol use: Not Currently    Alcohol/week: 0.0 standard drinks  . Drug use: No  . Sexual activity: Not Currently  Other Topics Concern  . Not on file  Social History Narrative  . Not on file   Social Determinants of Health   Financial Resource Strain: Not on file  Food Insecurity: Not on file  Transportation Needs: Not on file  Physical Activity: Not on file  Stress: Not on file  Social Connections: Not on file  Intimate Partner Violence: Not on file    Family History  Problem Relation Age of Onset  . Cancer Brother   . Breast cancer Paternal Grandmother      Current Outpatient Medications:  .  acetaminophen (TYLENOL) 500 MG tablet, Take 500 mg by mouth daily as needed for moderate pain. , Disp: , Rfl:  .  alendronate (FOSAMAX) 70 MG tablet, Take 1 tablet (70 mg total) by mouth once a week. Take with a full glass of water on an empty stomach., Disp: 4 tablet, Rfl: 3 .  amLODipine (NORVASC) 5 MG tablet, Take 1 tablet (5 mg total) by mouth daily., Disp: 30 tablet, Rfl: 5 .  aspirin EC 81 MG tablet, Take 81 mg by mouth daily. , Disp: , Rfl:  .  fexofenadine (ALLEGRA) 180 MG tablet, Take 180 mg by mouth daily., Disp: , Rfl:  .  fluticasone (FLONASE) 50 MCG/ACT nasal spray, Place 2 sprays into both nostrils daily., Disp: 16 g, Rfl: 3 .  gabapentin (NEURONTIN) 100 MG capsule, Take 1 capsule (100 mg  total) by mouth at bedtime., Disp: 90 capsule, Rfl: 0 .  latanoprost (XALATAN) 0.005 % ophthalmic solution, Place 1 drop into both eyes daily. , Disp: , Rfl:  .  letrozole (FEMARA) 2.5 MG tablet, ONE TABLET DAILY DO NOT START UNTIL COMPLETED RADIATION WITH 3 OR 4 DAY BREAK AFTER RADIATION THEN START, Disp: 30 tablet, Rfl: 1 .  lisinopril-hydrochlorothiazide (ZESTORETIC) 20-12.5 MG tablet, Take 1 tablet by mouth 2 (two) times daily., Disp: 180 tablet, Rfl: 3 .  loratadine (CLARITIN) 10 MG tablet, Take 10 mg by mouth daily., Disp: , Rfl:  .  Multiple Vitamin (MULTIVITAMIN WITH MINERALS) TABS tablet, Take 1 tablet by mouth daily. , Disp: , Rfl:  .  potassium chloride SA (KLOR-CON) 20 MEQ tablet, Take 1 tablet (20 mEq total) by mouth daily., Disp: 14 tablet, Rfl: 0 .  predniSONE (DELTASONE) 10 MG tablet, FOLLOW DIRECTIONS ON DOSING CALENDAR, Disp: , Rfl:   Physical exam:  Vitals:   04/01/21 0929  BP: (!) 143/80  Pulse: (!) 108  Resp: 20  Temp: (!) 97.2 F (36.2 C)  TempSrc: Tympanic  SpO2: 99%  Weight: 137 lb 8 oz (62.4 kg)  Physical Exam Constitutional:      General: She is not in acute distress. Cardiovascular:     Rate and Rhythm: Normal rate and regular rhythm.     Heart sounds: Normal heart sounds.  Pulmonary:     Effort: Pulmonary effort is normal.     Breath sounds: Normal breath sounds.  Abdominal:     General: Bowel sounds are normal.     Palpations: Abdomen is soft.  Skin:    General: Skin is warm and dry.  Neurological:     Mental Status: She is alert and oriented to person, place, and time.    Breast exam was performed in seated and lying down position. Patient is status post left lumpectomy with a well-healed surgical scar. No evidence of any palpable masses. No evidence of axillary adenopathy. No evidence of any palpable masses or lumps in the right breast. No evidence of right axillary adenopathy   CMP Latest Ref Rng & Units 04/01/2021  Glucose 70 - 99 mg/dL  164(H)  BUN 8 - 23 mg/dL 11  Creatinine 0.44 - 1.00 mg/dL 0.72  Sodium 135 - 145 mmol/L 136  Potassium 3.5 - 5.1 mmol/L 2.8(L)  Chloride 98 - 111 mmol/L 97(L)  CO2 22 - 32 mmol/L 27  Calcium 8.9 - 10.3 mg/dL 9.3  Total Protein 6.5 - 8.1 g/dL 7.7  Total Bilirubin 0.3 - 1.2 mg/dL 0.6  Alkaline Phos 38 - 126 U/L 35(L)  AST 15 - 41 U/L 23  ALT 0 - 44 U/L 17   CBC Latest Ref Rng & Units 04/01/2021  WBC 4.0 - 10.5 K/uL 5.0  Hemoglobin 12.0 - 15.0 g/dL 12.6  Hematocrit 36.0 - 46.0 % 39.2  Platelets 150 - 400 K/uL 348    No images are attached to the encounter.  DG Knee Complete 4 Views Left  Result Date: 03/16/2021 CLINICAL DATA:  Acute left knee pain.  Fall last year. EXAM: LEFT KNEE - COMPLETE 4+ VIEW COMPARISON:  None. FINDINGS: There is a lucency through the lateral aspect of the patella consistent with a nonacute fracture. Tricompartmental degenerative changes are seen, most prominent in the patellofemoral compartment. No acute fracture or effusion. No other acute abnormality. IMPRESSION: 1. Remote patellar fracture without union through the lateral aspect of the patella. 2. Tricompartmental degenerative changes. 3. No other acute abnormalities. Electronically Signed   By: Dorise Bullion III M.D   On: 03/16/2021 08:01   CT HEMATURIA WORKUP  Result Date: 03/05/2021 CLINICAL DATA:  75 y.o. female referred for evaluation of gross hematuria and nephrolithiasis Hx of breast cancer Hx of hysterectomy. EXAM: CT ABDOMEN AND PELVIS WITHOUT AND WITH CONTRAST TECHNIQUE: Multidetector CT imaging of the abdomen and pelvis was performed following the standard protocol before and following the bolus administration of intravenous contrast. CONTRAST:  139m OMNIPAQUE IOHEXOL 300 MG/ML  SOLN COMPARISON:  None. FINDINGS: Lower chest: No acute abnormality. Normal size heart. No pericardial effusion. Hepatobiliary: Scattered tiny sub 4 mm hypodense hepatic lesions which are technically too small to accurately  characterize but favored represent hepatic cyst. Gallbladder is unremarkable. No biliary ductal dilation. Pancreas: Pancreatic divisum.  No acute pancreatic inflammation. Spleen: Unremarkable Adrenals/Urinary Tract: Bilateral adrenal glands are unremarkable. Nonenhancing hypodense 1.3 cm right interpolar renal cyst. There are other bilateral hypodense renal lesions which are technically too small to accurately characterize also favored represent cysts. No hydronephrosis. Nonobstructive left-sided renal stones measuring up to 5 mm. No right-sided renal stones. No filling defect visualized within the opacified portion  of the collecting system in ureter on delayed imaging. However, the majority of the right ureter is not well opacified limiting evaluation. Urinary bladder is unremarkable for degree of distension. Stomach/Bowel: Stomach is unremarkable. Normal positioning of the duodenum/ligament of [place]. No suspicious bowel dilation. Appendix and terminal ileum are within normal limits. Sigmoid colonic diverticulosis without findings of acute diverticulitis. Vascular/Lymphatic: Aortic atherosclerosis. No enlarged abdominal or pelvic lymph nodes. Reproductive: Status post hysterectomy. No adnexal masses. Other: No abdominopelvic ascites. Musculoskeletal: Dextroconvex curvature of the lumbar spine. Multilevel degenerative changes. No acute osseous abnormality. IMPRESSION: 1. Nonobstructive left nephrolithiasis measuring up to 5 mm. 2. No hydronephrosis.  No suspicious enhancing renal lesions. 3. Sigmoid colonic diverticulosis without findings of acute diverticulitis. 4. Aortic atherosclerosis.  Aortic Atherosclerosis (ICD10-I70.0). Electronically Signed   By: Dahlia Bailiff MD   On: 03/05/2021 14:12     Assessment and plan- Patient is a 75 y.o. 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   She is s/p adjuvant radiation therapy and currently on letrozole and this is a routine follow-up visit  Clinically patient is doing well with no concerning signs and symptoms of recurrence based on today's exam.  She will be due for mammogram in June 2022 which will be coordinated by Dr. Dahlia Byes.   Hypokalemia: I will refill potassium 20 mEq daily for 2 weeks and repeat BMP in 2 weeks.  We will also notify her primary care provider Leretha Pol to follow-up on her potassium subsequently.  I will see her back in 6 months   Visit Diagnosis 1. Encounter for follow-up surveillance of breast cancer   2. Use of letrozole (Femara)      Dr. Randa Evens, MD, MPH [facility] at [facility] [phone] 04/01/2021 11:03 AM

## 2021-04-04 ENCOUNTER — Other Ambulatory Visit: Admission: RE | Admit: 2021-04-04 | Payer: Medicare Other | Source: Ambulatory Visit

## 2021-04-10 DIAGNOSIS — M5416 Radiculopathy, lumbar region: Secondary | ICD-10-CM | POA: Diagnosis not present

## 2021-04-15 ENCOUNTER — Inpatient Hospital Stay (HOSPITAL_BASED_OUTPATIENT_CLINIC_OR_DEPARTMENT_OTHER): Payer: Medicare Other | Admitting: Internal Medicine

## 2021-04-15 ENCOUNTER — Telehealth: Payer: Self-pay

## 2021-04-15 DIAGNOSIS — Z853 Personal history of malignant neoplasm of breast: Secondary | ICD-10-CM

## 2021-04-15 DIAGNOSIS — Z923 Personal history of irradiation: Secondary | ICD-10-CM | POA: Diagnosis not present

## 2021-04-15 DIAGNOSIS — Z08 Encounter for follow-up examination after completed treatment for malignant neoplasm: Secondary | ICD-10-CM

## 2021-04-15 DIAGNOSIS — Z79811 Long term (current) use of aromatase inhibitors: Secondary | ICD-10-CM | POA: Diagnosis not present

## 2021-04-15 DIAGNOSIS — C50912 Malignant neoplasm of unspecified site of left female breast: Secondary | ICD-10-CM | POA: Diagnosis not present

## 2021-04-15 DIAGNOSIS — E785 Hyperlipidemia, unspecified: Secondary | ICD-10-CM | POA: Diagnosis not present

## 2021-04-15 DIAGNOSIS — Z170 Estrogen receptor positive status [ER+]: Secondary | ICD-10-CM | POA: Diagnosis not present

## 2021-04-15 DIAGNOSIS — E876 Hypokalemia: Secondary | ICD-10-CM | POA: Diagnosis not present

## 2021-04-15 LAB — BASIC METABOLIC PANEL
Anion gap: 11 (ref 5–15)
BUN: 12 mg/dL (ref 8–23)
CO2: 29 mmol/L (ref 22–32)
Calcium: 10.4 mg/dL — ABNORMAL HIGH (ref 8.9–10.3)
Chloride: 95 mmol/L — ABNORMAL LOW (ref 98–111)
Creatinine, Ser: 0.65 mg/dL (ref 0.44–1.00)
GFR, Estimated: >60 mL/min (ref >60)
Glucose, Bld: 90 mg/dL (ref 70–99)
Potassium: 3.1 mmol/L — ABNORMAL LOW (ref 3.5–5.1)
Sodium: 135 mmol/L (ref 135–145)

## 2021-04-15 NOTE — Telephone Encounter (Signed)
04/11/2021- spoke with patient and gave her results,patient stated that she has a appt at [place] office  On 05/15/2021.SJC

## 2021-04-15 NOTE — Telephone Encounter (Signed)
-----   Message from Sindy Guadeloupe, MD sent at 04/15/2021 10:52 AM EDT ----- Potassium better but still low. She needs to f/u with Dr. Humphrey Rolls about this

## 2021-04-18 ENCOUNTER — Telehealth: Payer: Self-pay

## 2021-04-18 ENCOUNTER — Other Ambulatory Visit: Payer: Self-pay

## 2021-04-18 DIAGNOSIS — E876 Hypokalemia: Secondary | ICD-10-CM

## 2021-04-18 MED ORDER — POTASSIUM CHLORIDE CRYS ER 20 MEQ PO TBCR: 20.0000 meq | EXTENDED_RELEASE_TABLET | Freq: Every day | ORAL | 1 refills | Status: DC

## 2021-04-18 NOTE — Telephone Encounter (Signed)
Pt advised that we send med for potassium and repeat labs in 1 week as per dr Humphrey Rolls

## 2021-04-18 NOTE — Progress Notes (Signed)
Thank you, will be in touch with pt

## 2021-04-18 NOTE — Telephone Encounter (Signed)
lmom  To call us back  

## 2021-04-26 DIAGNOSIS — E876 Hypokalemia: Secondary | ICD-10-CM | POA: Diagnosis not present

## 2021-04-27 LAB — BASIC METABOLIC PANEL
BUN/Creatinine Ratio: 14 (ref 12–28)
BUN: 9 mg/dL (ref 8–27)
CO2: 19 mmol/L — ABNORMAL LOW (ref 20–29)
Calcium: 9.2 mg/dL (ref 8.7–10.3)
Chloride: 96 mmol/L (ref 96–106)
Creatinine, Ser: 0.66 mg/dL (ref 0.57–1.00)
Glucose: 128 mg/dL — ABNORMAL HIGH (ref 65–99)
Potassium: 3.8 mmol/L (ref 3.5–5.2)
Sodium: 136 mmol/L (ref 134–144)
eGFR: 91 mL/min/{1.73_m2} (ref >59)

## 2021-04-29 ENCOUNTER — Other Ambulatory Visit: Payer: Self-pay | Admitting: Oncology

## 2021-05-07 ENCOUNTER — Other Ambulatory Visit: Payer: Self-pay

## 2021-05-07 DIAGNOSIS — C50312 Malignant neoplasm of lower-inner quadrant of left female breast: Secondary | ICD-10-CM

## 2021-05-09 DIAGNOSIS — H2513 Age-related nuclear cataract, bilateral: Secondary | ICD-10-CM | POA: Diagnosis not present

## 2021-05-09 DIAGNOSIS — H43823 Vitreomacular adhesion, bilateral: Secondary | ICD-10-CM | POA: Diagnosis not present

## 2021-05-16 ENCOUNTER — Other Ambulatory Visit: Payer: Self-pay

## 2021-05-16 ENCOUNTER — Ambulatory Visit (INDEPENDENT_AMBULATORY_CARE_PROVIDER_SITE_OTHER): Payer: Medicare Other | Admitting: Nurse Practitioner

## 2021-05-16 ENCOUNTER — Encounter: Payer: Self-pay | Admitting: Nurse Practitioner

## 2021-05-16 VITALS — BP 156/86 | HR 100 | Temp 97.6°F | Resp 16 | Ht 65.0 in | Wt 137.0 lb

## 2021-05-16 DIAGNOSIS — E876 Hypokalemia: Secondary | ICD-10-CM | POA: Diagnosis not present

## 2021-05-16 DIAGNOSIS — E782 Mixed hyperlipidemia: Secondary | ICD-10-CM

## 2021-05-16 DIAGNOSIS — Z8639 Personal history of other endocrine, nutritional and metabolic disease: Secondary | ICD-10-CM

## 2021-05-16 DIAGNOSIS — C50512 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 breast: Secondary | ICD-10-CM

## 2021-05-16 DIAGNOSIS — I10 Essential (primary) hypertension: Secondary | ICD-10-CM

## 2021-05-16 DIAGNOSIS — Z0001 Encounter for general adult medical examination with abnormal findings: Secondary | ICD-10-CM

## 2021-05-16 DIAGNOSIS — Z170 Estrogen receptor positive status [ER+]: Secondary | ICD-10-CM | POA: Diagnosis not present

## 2021-05-16 DIAGNOSIS — N390 Urinary tract infection, site not specified: Secondary | ICD-10-CM | POA: Diagnosis not present

## 2021-05-16 NOTE — Progress Notes (Signed)
[facility] [place], [place] [address]  Internal MEDICINE  Office Visit Note  Patient Name: Virginia Warner  [id number]  [id number]  Date of Service: 05/19/2021  Chief Complaint  Patient presents with  . Medicare Wellness  . Hyperlipidemia  . Hypertension  . Quality Metric Gaps    Pt had covid booster     HPI  Virginia Warner presents for a annual physical and well visit with a history of hyperlipidemia and hypertension. -blood pressure is 156/86 today. -History of breast cancer, mammogram scheduled in June. Recently had breast exam with oncologist. Declined clinical breast exam today.   -had eye exam in may 2022.  - has some right hip pain but it is manageable. -Colonoscopy scheduled for the end of. may Has cataracts, not large enough to remove.  -staying active, walking daily   Current Medication: Outpatient Encounter Medications as of 05/16/2021  Medication Sig Note  . acetaminophen (TYLENOL) 500 MG tablet Take 500 mg by mouth daily as needed for moderate pain.    Marland Kitchen alendronate (FOSAMAX) 70 MG tablet Take 1 tablet (70 mg total) by mouth once a week. Take with a full glass of water on an empty stomach.   Marland Kitchen amLODipine (NORVASC) 5 MG tablet Take 1 tablet (5 mg total) by mouth daily.   Marland Kitchen aspirin EC 81 MG tablet Take 81 mg by mouth daily.    . fexofenadine (ALLEGRA) 180 MG tablet Take 180 mg by mouth daily.   . fluticasone (FLONASE) 50 MCG/ACT nasal spray Place 2 sprays into both nostrils daily.   Marland Kitchen gabapentin (NEURONTIN) 100 MG capsule Take 1 capsule (100 mg total) by mouth at bedtime.   Marland Kitchen latanoprost (XALATAN) 0.005 % ophthalmic solution Place 1 drop into both eyes daily.  06/26/2020: Pt states she uses whenever not always at night  . letrozole (FEMARA) 2.5 MG tablet ONE TABLET DAILY DO NOT START UNTIL COMPLETED RADIATION WITH 3 OR 4 DAY BREAK AFTER RADIATION THEN START   . lisinopril-hydrochlorothiazide (ZESTORETIC) 20-12.5 MG tablet Take 1 tablet by mouth 2  (two) times daily.   Marland Kitchen loratadine (CLARITIN) 10 MG tablet Take 10 mg by mouth daily.   . Multiple Vitamin (MULTIVITAMIN WITH MINERALS) TABS tablet Take 1 tablet by mouth daily.  06/26/2020: Pt ran out, needs to get more  . potassium chloride SA (KLOR-CON) 20 MEQ tablet Take 1 tablet (20 mEq total) by mouth daily.   . predniSONE (DELTASONE) 10 MG tablet FOLLOW DIRECTIONS ON DOSING CALENDAR    No facility-administered encounter medications on file as of 05/16/2021.    Surgical History: Past Surgical History:  Procedure Laterality Date  . ABDOMINAL HYSTERECTOMY     partial  . BREAST BIOPSY Left 06/12/2020   [place] Bx, Ribbon Clip, path pending   . BREAST LUMPECTOMY,RADIO FREQ LOCALIZER,AXILLARY SENTINEL LYMPH NODE BIOPSY Left 07/10/2020   Procedure: BREAST LUMPECTOMY,RADIO FREQ LOCALIZER,AXILLARY SENTINEL LYMPH NODE BIOPSY;  Surgeon: Jules Husbands, MD;  Location: [facility] ORS;  Service: General;  Laterality: Left;  . COLONOSCOPY WITH PROPOFOL N/A 10/18/2015   Procedure: COLONOSCOPY WITH PROPOFOL;  Surgeon: Manya Silvas, MD;  Location: [facility] ENDOSCOPY;  Service: Endoscopy;  Laterality: N/A;  . INGUINAL HERNIA REPAIR     age 24's  . POLYPECTOMY      Medical History: Past Medical History:  Diagnosis Date  . Allergy   . Arthritis   . Breast cancer ([place])   . Hyperlipidemia   . Hypertension   . Seasonal allergies  Family History: Family History  Problem Relation Age of Onset  . Cancer Brother   . Breast cancer Paternal Grandmother     Social History   Socioeconomic History  . Marital status: Divorced    Spouse name: Not on file  . Number of children: Not on file  . Years of education: Not on file  . Highest education level: Not on file  Occupational History  . Not on file  Tobacco Use  . Smoking status: Former Smoker    Quit date: 11/2007    Years since quitting: 13.4  . Smokeless tobacco: Never Used  Vaping Use  . Vaping Use: Never used  Substance and Sexual Activity   . Alcohol use: Not Currently    Alcohol/week: 0.0 standard drinks  . Drug use: No  . Sexual activity: Not Currently  Other Topics Concern  . Not on file  Social History Narrative  . Not on file   Social Determinants of Health   Financial Resource Strain: Not on file  Food Insecurity: Not on file  Transportation Needs: Not on file  Physical Activity: Not on file  Stress: Not on file  Social Connections: Not on file  Intimate Partner Violence: Not on file      Review of Systems  Constitutional: Negative for activity change, appetite change, chills, diaphoresis, fatigue, fever and unexpected weight change.  HENT: Negative for congestion, ear discharge, ear pain, facial swelling, hearing loss, nosebleeds, postnasal drip, rhinorrhea, sinus pressure, sinus pain, sneezing, sore throat, tinnitus, trouble swallowing and voice change.   Eyes: Negative for photophobia, pain, discharge, redness, itching and visual disturbance.  Respiratory: Negative for apnea, cough, choking, chest tightness, shortness of breath, wheezing and stridor.   Cardiovascular: Negative for chest pain, palpitations and leg swelling.  Gastrointestinal: Negative for abdominal distention, abdominal pain, anal bleeding, constipation, diarrhea, nausea and rectal pain.  Endocrine: Negative for cold intolerance, heat intolerance, polydipsia, polyphagia and polyuria.  Genitourinary: Negative for difficulty urinating, flank pain, frequency, genital sores, hematuria, menstrual problem, pelvic pain, urgency, vaginal bleeding, vaginal discharge and vaginal pain.  Musculoskeletal: Negative for arthralgias, back pain, gait problem, joint swelling, myalgias and neck pain.  Skin: Negative for color change, pallor, rash and wound.  Allergic/Immunologic: Negative for environmental allergies, food allergies and immunocompromised state.  Neurological: Negative for dizziness, seizures, syncope, facial asymmetry, speech difficulty,  weakness, light-headedness, numbness and headaches.  Hematological: Negative for adenopathy. Does not bruise/bleed easily.  Psychiatric/Behavioral: Negative for agitation, behavioral problems, confusion, decreased concentration, dysphoric mood, hallucinations, self-injury, sleep disturbance and suicidal ideas. The patient is not nervous/anxious and is not hyperactive.      Vital Signs: BP (!) 156/86   Pulse 100   Temp 97.6 F (36.4 C)   Resp 16   Ht '5\' 5"'  (1.651 m)   Wt 137 lb (62.1 kg)   SpO2 97%   BMI 22.80 kg/m    Physical Exam Vitals reviewed.  Constitutional:      General: She is not in acute distress.    Appearance: She is well-developed. She is not diaphoretic.  HENT:     Head: Normocephalic and atraumatic.     Mouth/Throat:     Pharynx: No oropharyngeal exudate.  Eyes:     Pupils: Pupils are equal, round, and reactive to light.  Neck:     Thyroid: No thyromegaly.     Vascular: No JVD.     Trachea: No tracheal deviation.  Cardiovascular:     Rate and Rhythm: Normal rate and  regular rhythm.     Heart sounds: Normal heart sounds. No murmur heard. No friction rub. No gallop.   Pulmonary:     Effort: Pulmonary effort is normal. No respiratory distress.     Breath sounds: No wheezing or rales.  Chest:     Chest wall: No tenderness.  Abdominal:     General: Bowel sounds are normal.     Palpations: Abdomen is soft.  Musculoskeletal:        General: Normal range of motion.     Cervical back: Normal range of motion and neck supple.  Lymphadenopathy:     Cervical: No cervical adenopathy.  Skin:    General: Skin is warm and dry.  Neurological:     Mental Status: She is alert and oriented to person, place, and time.     Cranial Nerves: No cranial nerve deficit.  Psychiatric:        Behavior: Behavior normal.        Thought Content: Thought content normal.        Judgment: Judgment normal.    Assessment/Plan: 1. Encounter for general adult medical examination  with abnormal findings Age-appropriate preventive screenings discussed, annual physical exam completed. Routine labs for health maintenance ordered. - UA/M w/rflx Culture, Routine - Microscopic Examination - Urine Culture, Reflex  2. Essential hypertension History of hypertension, BP today was 156/86. Current medications are amlodipine 5 mg daily and lisinopril-hydrochlorothiazide. Will recheck BP at follow up visit.   3.  Hypokalemia Patient has a history of hypokalemia and is taking a diuretic. CMP ordered to assess current potassium level. Potassium level was 2.8 1 month ago. She has been taking prescribed potassium supplements.  - CMP14+EGFR  4. Mixed hyperlipidemia History of hyperlipidemia. Last lipid panel was drawn in march 2021. Lipid profile ordered. She is not currently taking any medication for hyperlipidemia. Total cholesterol on  Last lipid panel was 216 and LDL was 128, the rest of the lipid panel was normal.  - Lipid Profile   5. Malignant neoplasm of lower-outer quadrant of left breast of female, estrogen receptor positive ([place]) Pt is followed by Oncology     General Counseling: Sandy Salaam understanding of the findings of todays visit and agrees with plan of treatment. I have discussed any further diagnostic evaluation that may be needed or ordered today. We also reviewed her medications today. she has been encouraged to call the office with any questions or concerns that should arise related to todays visit.    Orders Placed This Encounter  Procedures  . Microscopic Examination  . Urine Culture, Reflex  . Lipid Profile  . UA/M w/rflx Culture, Routine  . CMP14+EGFR    No orders of the defined types were placed in this encounter.  Return in about 3 months (around 08/16/2021) for F/U, med refill, [place] PCP.  Total time spent:30 Minutes Time spent includes review of chart, medications, test results, and follow up plan with the patient.   [place] Controlled  Substance Database was reviewed by me.  This patient was seen by Jonetta Osgood, FNP-C in collaboration with Dr. Clayborn Bigness as a part of collaborative care agreement.    Dr [name] Internal medicine

## 2021-05-19 LAB — UA/M W/RFLX CULTURE, ROUTINE
Bilirubin, UA: NEGATIVE
Glucose, UA: NEGATIVE
Ketones, UA: NEGATIVE
Nitrite, UA: NEGATIVE
Protein,UA: NEGATIVE
Specific Gravity, UA: 1.005 (ref 1.005–1.030)
Urobilinogen, Ur: 0.2 mg/dL (ref 0.2–1.0)
pH, UA: 7 (ref 5.0–7.5)

## 2021-05-19 LAB — MICROSCOPIC EXAMINATION
Bacteria, UA: NONE SEEN
Casts: NONE SEEN /lpf
Epithelial Cells (non renal): NONE SEEN /hpf (ref 0–10)
RBC, Urine: NONE SEEN /hpf (ref 0–2)

## 2021-05-19 LAB — URINE CULTURE, REFLEX

## 2021-05-20 DIAGNOSIS — E782 Mixed hyperlipidemia: Secondary | ICD-10-CM | POA: Diagnosis not present

## 2021-05-20 DIAGNOSIS — Z8639 Personal history of other endocrine, nutritional and metabolic disease: Secondary | ICD-10-CM | POA: Diagnosis not present

## 2021-05-21 LAB — CMP14+EGFR
ALT: 15 IU/L (ref 0–32)
AST: 17 IU/L (ref 0–40)
Albumin/Globulin Ratio: 2 (ref 1.2–2.2)
Albumin: 4.7 g/dL (ref 3.7–4.7)
Alkaline Phosphatase: 45 IU/L (ref 44–121)
BUN/Creatinine Ratio: 10 — ABNORMAL LOW (ref 12–28)
BUN: 6 mg/dL — ABNORMAL LOW (ref 8–27)
Bilirubin Total: 0.5 mg/dL (ref 0.0–1.2)
CO2: 27 mmol/L (ref 20–29)
Calcium: 9.7 mg/dL (ref 8.7–10.3)
Chloride: 100 mmol/L (ref 96–106)
Creatinine, Ser: 0.61 mg/dL (ref 0.57–1.00)
Globulin, Total: 2.3 g/dL (ref 1.5–4.5)
Glucose: 104 mg/dL — ABNORMAL HIGH (ref 65–99)
Potassium: 3.7 mmol/L (ref 3.5–5.2)
Sodium: 140 mmol/L (ref 134–144)
Total Protein: 7 g/dL (ref 6.0–8.5)
eGFR: 93 mL/min/1.73

## 2021-05-21 LAB — LIPID PANEL
Chol/HDL Ratio: 4.8 ratio — ABNORMAL HIGH (ref 0.0–4.4)
Cholesterol, Total: 250 mg/dL — ABNORMAL HIGH (ref 100–199)
HDL: 52 mg/dL (ref >39)
LDL Chol Calc (NIH): 171 mg/dL — ABNORMAL HIGH (ref 0–99)
Triglycerides: 146 mg/dL (ref 0–149)
VLDL Cholesterol Cal: 27 mg/dL (ref 5–40)

## 2021-05-28 ENCOUNTER — Telehealth: Payer: Self-pay

## 2021-05-28 NOTE — Telephone Encounter (Signed)
Pt came into office asking about her potassium medication if she still needs to take it, per [name] she can stop taking them for now but I would like to recheck her potassium in 4 weeks, I'll send the lab in, no visit required.  I called pt back and informed her that she can stop her potassium and to do labs in 4 weeks and no visit needed.

## 2021-06-03 ENCOUNTER — Ambulatory Visit
Admission: RE | Admit: 2021-06-03 | Discharge: 2021-06-03 | Disposition: A | Discharge status: Home / Self Care | Payer: Medicare Other | Source: Ambulatory Visit | Attending: Surgery | Admitting: Surgery

## 2021-06-03 ENCOUNTER — Other Ambulatory Visit: Payer: Self-pay

## 2021-06-03 DIAGNOSIS — Z170 Estrogen receptor positive status [ER+]: Secondary | ICD-10-CM | POA: Diagnosis not present

## 2021-06-03 DIAGNOSIS — R922 Inconclusive mammogram: Secondary | ICD-10-CM | POA: Diagnosis not present

## 2021-06-03 DIAGNOSIS — C50312 Malignant neoplasm of lower-inner quadrant of left female breast: Secondary | ICD-10-CM | POA: Insufficient documentation

## 2021-06-10 ENCOUNTER — Ambulatory Visit (INDEPENDENT_AMBULATORY_CARE_PROVIDER_SITE_OTHER): Payer: Medicare Other | Admitting: Surgery

## 2021-06-10 ENCOUNTER — Encounter: Payer: Self-pay | Admitting: Surgery

## 2021-06-10 ENCOUNTER — Other Ambulatory Visit: Payer: Self-pay

## 2021-06-10 VITALS — BP 166/89 | HR 96 | Temp 98.1°F | Ht 64.5 in | Wt 135.0 lb

## 2021-06-10 DIAGNOSIS — C50312 Malignant neoplasm of lower-inner quadrant of left female breast: Secondary | ICD-10-CM | POA: Diagnosis not present

## 2021-06-10 DIAGNOSIS — Z170 Estrogen receptor positive status [ER+]: Secondary | ICD-10-CM | POA: Diagnosis not present

## 2021-06-10 NOTE — Progress Notes (Signed)
Outpatient Surgical Follow Up  06/10/2021  Virginia Warner is an 75 y.o. female.   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  ER strongly positive greater than 90%, PR 1 to 10% positive and HER-2 negative  Received adjuvant radiation.  She had a recent mammogram that have personally reviewed showing no evidence of any suspicious lesions.  She has some occasional pains on her axilla.  No fevers no chills no new lumps. She is tolerating letrozole  Past Medical History:  Diagnosis Date   Allergy    Arthritis    Breast cancer ([place])    Hyperlipidemia    Hypertension    Seasonal allergies     Past Surgical History:  Procedure Laterality Date   ABDOMINAL HYSTERECTOMY     partial   BREAST BIOPSY Left 06/12/2020   [place] Bx, Ribbon Clip, path pending    BREAST LUMPECTOMY Left 07/04/2020   [facility] lumpectomy with radiation   BREAST LUMPECTOMY,RADIO FREQ LOCALIZER,AXILLARY SENTINEL LYMPH NODE BIOPSY Left 07/10/2020   Procedure: BREAST LUMPECTOMY,RADIO FREQ LOCALIZER,AXILLARY SENTINEL LYMPH NODE BIOPSY;  Surgeon: Jules Husbands, MD;  Location: [facility] ORS;  Service: General;  Laterality: Left;   COLONOSCOPY WITH PROPOFOL N/A 10/18/2015   Procedure: COLONOSCOPY WITH PROPOFOL;  Surgeon: Manya Silvas, MD;  Location: [facility] ENDOSCOPY;  Service: Endoscopy;  Laterality: N/A;   INGUINAL HERNIA REPAIR     age 20's   POLYPECTOMY      Family History  Problem Relation Age of Onset   Cancer Brother    Breast cancer Paternal Grandmother     Social History:  reports that she quit smoking about 13 years ago. She has never used smokeless tobacco. She reports previous alcohol use. She reports that she does not use drugs.  Allergies:  Allergies  Allergen Reactions   Aspirin Nausea And Vomiting    Cannot tolerate unless  enteric coated   Atorvastatin Other (See Comments)    Bone pain    Medications reviewed.    ROS Full ROS performed and is otherwise negative other than what is stated in HPI   BP (!) 166/89   Pulse 96   Temp 98.1 F (36.7 C) (Oral)   Ht 5' 4.5" (1.638 m)   Wt 135 lb (61.2 kg)   SpO2 96%   BMI 22.81 kg/m   Physical Exam Vitals and nursing note reviewed. Exam conducted with a chaperone present.  Constitutional:      General: She is not in acute distress.    Appearance: Normal appearance. She is normal weight.  Eyes:     General: No scleral icterus.       Right eye: No discharge.        Left eye: No discharge.  Cardiovascular:     Rate and Rhythm: Normal rate and regular rhythm.     Heart sounds: No murmur heard. Pulmonary:     Effort: Pulmonary effort is normal. No respiratory distress.     Breath sounds: Normal breath sounds. No stridor.     Comments: BREAST: Left lumpectomy scar and radiation changes.  No evidence of infection no evidence of new lesions.  There is no evidence of lymphedema and no evidence of lymphadenopathy.  Right breast and right axilla is completely normal. Musculoskeletal:        General: No swelling or  tenderness. Normal range of motion.     Cervical back: Normal range of motion and neck supple. No rigidity or tenderness.  Skin:    General: Skin is warm and dry.  Neurological:     General: No focal deficit present.     Mental Status: She is alert and oriented to person, place, and time.  Psychiatric:        Mood and Affect: Mood normal.        Behavior: Behavior normal.        Thought Content: Thought content normal.        Judgment: Judgment normal.     Assessment/Plan:  1. Malignant neoplasm of lower-inner quadrant of left breast in female, estrogen receptor positive ([place]) No evidence of recurrence or any suspicious lesions that merit any further work-up.  We will continue yearly mammograms with physical exams.    Greater than 50%  of the 30 minutes  visit was spent in counseling/coordination of care   Caroleen Hamman, MD [place] Surgeon

## 2021-06-10 NOTE — Patient Instructions (Signed)
We will reach out to you June of 2023 to schedule your mammogram and breast exam. If you do not hear from [place] in June please call our office to follow up.   Continue doing self breast exams.

## 2021-06-21 ENCOUNTER — Ambulatory Visit (INDEPENDENT_AMBULATORY_CARE_PROVIDER_SITE_OTHER): Payer: Medicare Other | Admitting: Internal Medicine

## 2021-06-21 ENCOUNTER — Encounter: Payer: Self-pay | Admitting: Nurse Practitioner

## 2021-06-21 VITALS — Ht 64.5 in | Wt 135.0 lb

## 2021-06-21 DIAGNOSIS — E876 Hypokalemia: Secondary | ICD-10-CM | POA: Diagnosis not present

## 2021-06-21 DIAGNOSIS — J0101 Acute recurrent maxillary sinusitis: Secondary | ICD-10-CM

## 2021-06-21 MED ORDER — AMOXICILLIN-POT CLAVULANATE 875-125 MG PO TABS: 1.0000 | ORAL_TABLET | Freq: Two times a day (BID) | ORAL | 0 refills | Status: DC

## 2021-06-21 NOTE — Progress Notes (Signed)
[facility] [place], [place] [address]  Internal MEDICINE  Telephone Visit  Patient Name: Virginia Warner  [id number]  [id number]  Date of Service: 06/21/2021  I connected with the patient at 1024 am  by telephone and verified the patients identity using two identifiers.   I discussed the limitations, risks, security and privacy concerns of performing an evaluation and management service by telephone and the availability of in person appointments. I also discussed with the patient that there may be a patient responsible charge related to the service.  The patient expressed understanding and agrees to proceed.    Chief Complaint  Patient presents with   Telephone Assessment    [phone]   Telephone Screen   Sinusitis   Nasal Congestion    HPI  Pt is connected via virtual visit. C/o sinus congestion, having nose bleed and some drying in her nose as well    Current Medication: Outpatient Encounter Medications as of 06/21/2021  Medication Sig Note   amoxicillin-clavulanate (AUGMENTIN) 875-125 MG tablet Take 1 tablet by mouth 2 (two) times daily.    acetaminophen (TYLENOL) 500 MG tablet Take 500 mg by mouth daily as needed for moderate pain.     alendronate (FOSAMAX) 70 MG tablet Take 1 tablet (70 mg total) by mouth once a week. Take with a full glass of water on an empty stomach.    amLODipine (NORVASC) 5 MG tablet Take 1 tablet (5 mg total) by mouth daily.    aspirin EC 81 MG tablet Take 81 mg by mouth daily.     fexofenadine (ALLEGRA) 180 MG tablet Take 180 mg by mouth daily.    fluticasone (FLONASE) 50 MCG/ACT nasal spray Place 2 sprays into both nostrils daily.    gabapentin (NEURONTIN) 100 MG capsule Take 1 capsule (100 mg total) by mouth at bedtime.    latanoprost (XALATAN) 0.005 % ophthalmic solution Place 1 drop into both eyes daily.  06/26/2020: Pt states she uses whenever not always at night   letrozole (FEMARA) 2.5 MG tablet ONE TABLET DAILY DO  NOT START UNTIL COMPLETED RADIATION WITH 3 OR 4 DAY BREAK AFTER RADIATION THEN START    lisinopril-hydrochlorothiazide (ZESTORETIC) 20-12.5 MG tablet Take 1 tablet by mouth 2 (two) times daily.    loratadine (CLARITIN) 10 MG tablet Take 10 mg by mouth daily.    Multiple Vitamin (MULTIVITAMIN WITH MINERALS) TABS tablet Take 1 tablet by mouth daily.  06/26/2020: Pt ran out, needs to get more   potassium chloride SA (KLOR-CON) 20 MEQ tablet Take 1 tablet (20 mEq total) by mouth daily.    predniSONE (DELTASONE) 10 MG tablet FOLLOW DIRECTIONS ON DOSING CALENDAR    No facility-administered encounter medications on file as of 06/21/2021.    Surgical History: Past Surgical History:  Procedure Laterality Date   ABDOMINAL HYSTERECTOMY     partial   BREAST BIOPSY Left 06/12/2020   [place] Bx, Ribbon Clip, path pending    BREAST LUMPECTOMY Left 07/04/2020   [facility] lumpectomy with radiation   BREAST LUMPECTOMY,RADIO FREQ LOCALIZER,AXILLARY SENTINEL LYMPH NODE BIOPSY Left 07/10/2020   Procedure: BREAST LUMPECTOMY,RADIO FREQ LOCALIZER,AXILLARY SENTINEL LYMPH NODE BIOPSY;  Surgeon: Jules Husbands, MD;  Location: [facility] ORS;  Service: General;  Laterality: Left;   COLONOSCOPY WITH PROPOFOL N/A 10/18/2015   Procedure: COLONOSCOPY WITH PROPOFOL;  Surgeon: Manya Silvas, MD;  Location: [facility] ENDOSCOPY;  Service: Endoscopy;  Laterality: N/A;   INGUINAL HERNIA REPAIR     age 20's  POLYPECTOMY      Medical History: Past Medical History:  Diagnosis Date   Allergy    Arthritis    Breast cancer ([place])    Hyperlipidemia    Hypertension    Seasonal allergies     Family History: Family History  Problem Relation Age of Onset   Cancer Brother    Breast cancer Paternal Grandmother     Social History   Socioeconomic History   Marital status: Divorced    Spouse name: Not on file   Number of children: Not on file   Years of education: Not on file   Highest education level: Not on file  Occupational History    Not on file  Tobacco Use   Smoking status: Former    Pack years: 0.00    Types: Cigarettes    Quit date: 11/2007    Years since quitting: 13.5   Smokeless tobacco: Never  Vaping Use   Vaping Use: Never used  Substance and Sexual Activity   Alcohol use: Not Currently    Alcohol/week: 0.0 standard drinks   Drug use: No   Sexual activity: Not Currently  Other Topics Concern   Not on file  Social History Narrative   Not on file   Social Determinants of Health   Financial Resource Strain: Not on file  Food Insecurity: Not on file  Transportation Needs: Not on file  Physical Activity: Not on file  Stress: Not on file  Social Connections: Not on file  Intimate Partner Violence: Not on file      Review of Systems  Constitutional:  Negative for fatigue and fever.  HENT:  Positive for nosebleeds and sinus pressure. Negative for congestion, mouth sores and postnasal drip.   Respiratory:  Negative for cough.   Cardiovascular:  Negative for chest pain.  Genitourinary:  Negative for flank pain.  Psychiatric/Behavioral: Negative.     Vital Signs: Ht 5' 4.5" (1.638 m)   Wt 135 lb (61.2 kg)   BMI 22.81 kg/m    Observation/Objective: NAD     Assessment/Plan: 1. Hypokalemia Pt is to resume her potassium, Will recheck on next visit   2. Acute recurrent maxillary sinusitis Will start abx as prescribed  - amoxicillin-clavulanate (AUGMENTIN) 875-125 MG tablet; Take 1 tablet by mouth 2 (two) times daily.  Dispense: 20 tablet; Refill: 0   General Counseling: Sandy Salaam understanding of the findings of today's phone visit and agrees with plan of treatment. I have discussed any further diagnostic evaluation that may be needed or ordered today. We also reviewed her medications today. she has been encouraged to call the office with any questions or concerns that should arise related to todays visit.   Meds ordered this encounter  Medications   amoxicillin-clavulanate  (AUGMENTIN) 875-125 MG tablet    Sig: Take 1 tablet by mouth 2 (two) times daily.    Dispense:  20 tablet    Refill:  0    Time spent:15 Minutes    Dr [name] Internal medicine

## 2021-06-24 ENCOUNTER — Other Ambulatory Visit: Payer: Self-pay | Admitting: Oncology

## 2021-06-25 DIAGNOSIS — M5459 Other low back pain: Secondary | ICD-10-CM | POA: Diagnosis not present

## 2021-06-25 DIAGNOSIS — M5416 Radiculopathy, lumbar region: Secondary | ICD-10-CM | POA: Diagnosis not present

## 2021-06-27 ENCOUNTER — Other Ambulatory Visit: Payer: Self-pay

## 2021-06-27 DIAGNOSIS — M810 Age-related osteoporosis without current pathological fracture: Secondary | ICD-10-CM

## 2021-06-27 MED ORDER — ALENDRONATE SODIUM 70 MG PO TABS: 70.0000 mg | ORAL_TABLET | ORAL | 3 refills | Status: DC

## 2021-07-02 ENCOUNTER — Ambulatory Visit: Payer: Medicare Other | Admitting: Internal Medicine

## 2021-07-02 ENCOUNTER — Other Ambulatory Visit: Payer: Self-pay | Admitting: Internal Medicine

## 2021-07-02 DIAGNOSIS — I10 Essential (primary) hypertension: Secondary | ICD-10-CM

## 2021-07-02 NOTE — Progress Notes (Signed)
basic 

## 2021-07-09 ENCOUNTER — Ambulatory Visit (INDEPENDENT_AMBULATORY_CARE_PROVIDER_SITE_OTHER): Payer: Medicare Other | Admitting: Internal Medicine

## 2021-07-09 ENCOUNTER — Other Ambulatory Visit: Payer: Self-pay

## 2021-07-09 ENCOUNTER — Encounter: Payer: Self-pay | Admitting: Internal Medicine

## 2021-07-09 DIAGNOSIS — I10 Essential (primary) hypertension: Secondary | ICD-10-CM

## 2021-07-09 DIAGNOSIS — I700 Atherosclerosis of aorta: Secondary | ICD-10-CM | POA: Diagnosis not present

## 2021-07-09 DIAGNOSIS — E876 Hypokalemia: Secondary | ICD-10-CM

## 2021-07-09 DIAGNOSIS — G729 Myopathy, unspecified: Secondary | ICD-10-CM | POA: Diagnosis not present

## 2021-07-09 MED ORDER — ZOSTER VAC RECOMB ADJUVANTED 50 MCG/0.5ML IM SUSR: 0.5000 mL | Freq: Once | INTRAMUSCULAR | 0 refills | Status: AC

## 2021-07-09 NOTE — Progress Notes (Addendum)
[facility] [place], [place] [address]  Internal MEDICINE  Office Visit Note  Patient Name: Virginia Warner  [id number]  [id number]  Date of Service: 07/13/2021  Chief Complaint  Patient presents with   Follow-up    Review meds   Hyperlipidemia   Hypertension   Quality Metric Gaps    Shingrix, TDAP     HPI Patient is seen for routine follow-up She has been dealing with hypokalemia, since her chemotherapy and radiation for breast cancer patient is on Lisinopril/hctz for Blood pressure control, takes half tab of her Potassium supplement ( 20 meq ).  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   She is s/p adjuvant radiation therapy and currently on letrozole Patient does have history of hyperlipidemia however is unable to take statins due to myopathy, aortic atherosclerosis present Current Medication: Outpatient Encounter Medications as of 07/09/2021  Medication Sig Note   acetaminophen (TYLENOL) 500 MG tablet Take 500 mg by mouth daily as needed for moderate pain.     alendronate (FOSAMAX) 70 MG tablet Take 1 tablet (70 mg total) by mouth once a week. Take with a full glass of water on an empty stomach.    aspirin EC 81 MG tablet Take 81 mg by mouth daily.     fexofenadine (ALLEGRA) 180 MG tablet Take 180 mg by mouth daily.    gabapentin (NEURONTIN) 100 MG capsule Take 1 capsule (100 mg total) by mouth at bedtime.    latanoprost (XALATAN) 0.005 % ophthalmic solution Place 1 drop into both eyes daily.  06/26/2020: Pt states she uses whenever not always at night   letrozole (FEMARA) 2.5 MG tablet TAKE 1 TABLET BY MOUTH DAILY    lisinopril-hydrochlorothiazide (ZESTORETIC) 20-12.5 MG tablet Take 1 tablet by mouth 2 (two) times daily.    Multiple Vitamin (MULTIVITAMIN  WITH MINERALS) TABS tablet Take 1 tablet by mouth daily.  06/26/2020: Pt ran out, needs to get more   potassium chloride SA (KLOR-CON) 20 MEQ tablet Take 1 tablet (20 mEq total) by mouth daily.    [DISCONTINUED] amLODipine (NORVASC) 5 MG tablet Take 1 tablet (5 mg total) by mouth daily.    [DISCONTINUED] predniSONE (DELTASONE) 10 MG tablet FOLLOW DIRECTIONS ON DOSING CALENDAR    [DISCONTINUED] Zoster Vaccine Adjuvanted [facility]) injection Inject 0.5 mLs into the muscle once.    [EXPIRED] Zoster Vaccine Adjuvanted [facility]) injection Inject 0.5 mLs into the muscle once for 1 dose.    [DISCONTINUED] amoxicillin-clavulanate (AUGMENTIN) 875-125 MG tablet Take 1 tablet by mouth 2 (two) times daily. (Patient not taking: Reported on 07/09/2021)    [DISCONTINUED] fluticasone (FLONASE) 50 MCG/ACT nasal spray Place 2 sprays into both nostrils daily. (Patient not taking: Reported on 07/09/2021)    [DISCONTINUED] loratadine (CLARITIN) 10 MG tablet Take 10 mg by mouth daily. (Patient not taking: Reported on 07/09/2021)    No facility-administered encounter medications on file as of 07/09/2021.    Surgical History: Past Surgical History:  Procedure Laterality Date   ABDOMINAL HYSTERECTOMY     partial   BREAST BIOPSY Left 06/12/2020   [place] Bx, Ribbon Clip, path pending    BREAST LUMPECTOMY Left 07/04/2020   [facility] lumpectomy with radiation   BREAST LUMPECTOMY,RADIO FREQ LOCALIZER,AXILLARY SENTINEL LYMPH NODE BIOPSY Left 07/10/2020   Procedure: BREAST LUMPECTOMY,RADIO FREQ LOCALIZER,AXILLARY SENTINEL LYMPH  NODE BIOPSY;  Surgeon: Jules Husbands, MD;  Location: [facility] ORS;  Service: General;  Laterality: Left;   COLONOSCOPY WITH PROPOFOL N/A 10/18/2015   Procedure: COLONOSCOPY WITH PROPOFOL;  Surgeon: Manya Silvas, MD;  Location: [facility] ENDOSCOPY;  Service: Endoscopy;  Laterality: N/A;   INGUINAL HERNIA REPAIR     age 75's   POLYPECTOMY      Medical History: Past Medical History:  Diagnosis Date   Allergy     Arthritis    Breast cancer ([place])    Hyperlipidemia    Hypertension    Seasonal allergies     Family History: Family History  Problem Relation Age of Onset   Cancer Brother    Breast cancer Paternal Grandmother     Social History   Socioeconomic History   Marital status: Divorced    Spouse name: Not on file   Number of children: Not on file   Years of education: Not on file   Highest education level: Not on file  Occupational History   Not on file  Tobacco Use   Smoking status: Former    Types: Cigarettes    Quit date: 11/2007    Years since quitting: 13.6   Smokeless tobacco: Never  Vaping Use   Vaping Use: Never used  Substance and Sexual Activity   Alcohol use: Not Currently    Alcohol/week: 0.0 standard drinks   Drug use: No   Sexual activity: Not Currently  Other Topics Concern   Not on file  Social History Narrative   Not on file   Social Determinants of Health   Financial Resource Strain: Not on file  Food Insecurity: Not on file  Transportation Needs: Not on file  Physical Activity: Not on file  Stress: Not on file  Social Connections: Not on file  Intimate Partner Violence: Not on file      Review of Systems  Constitutional:  Negative for chills, diaphoresis and fatigue.  HENT:  Negative for ear pain, postnasal drip and sinus pressure.   Eyes:  Negative for photophobia, discharge, redness, itching and visual disturbance.  Respiratory:  Negative for cough, shortness of breath and wheezing.   Cardiovascular:  Negative for chest pain, palpitations and leg swelling.  Gastrointestinal:  Negative for abdominal pain, constipation, diarrhea, nausea and vomiting.  Genitourinary:  Negative for dysuria and flank pain.  Musculoskeletal:  Negative for arthralgias, back pain, gait problem and neck pain.  Skin:  Negative for color change.  Allergic/Immunologic: Negative for environmental allergies and food allergies.  Neurological:  Positive for numbness.  Negative for dizziness and headaches.       Left leg  Hematological:  Does not bruise/bleed easily.  Psychiatric/Behavioral:  Negative for agitation, behavioral problems (depression) and hallucinations.    Vital Signs: BP 128/80 Comment: 154/82  Pulse (!) 101   Temp (!) 97.3 F (36.3 C)   Resp 16   Ht 5' 4.5" (1.638 m)   Wt 137 lb 12.8 oz (62.5 kg)   SpO2 97%   BMI 23.29 kg/m    Physical Exam Constitutional:      General: She is not in acute distress.    Appearance: She is well-developed. She is not diaphoretic.  HENT:     Head: Normocephalic and atraumatic.     Mouth/Throat:     Pharynx: No oropharyngeal exudate.  Eyes:     Extraocular Movements: Extraocular movements intact.     Pupils: Pupils are equal, round, and reactive to light.  Neck:  Thyroid: No thyromegaly.     Vascular: No JVD.     Trachea: No tracheal deviation.  Cardiovascular:     Rate and Rhythm: Normal rate and regular rhythm.     Heart sounds: Normal heart sounds. No murmur heard.   No friction rub. No gallop.  Pulmonary:     Effort: Pulmonary effort is normal. No respiratory distress.     Breath sounds: No wheezing or rales.  Chest:     Chest wall: No tenderness.  Abdominal:     General: Bowel sounds are normal.     Palpations: Abdomen is soft.  Musculoskeletal:        General: Normal range of motion.     Cervical back: Normal range of motion and neck supple.  Lymphadenopathy:     Cervical: No cervical adenopathy.  Skin:    General: Skin is warm and dry.  Neurological:     Mental Status: She is alert and oriented to person, place, and time.     Cranial Nerves: No cranial nerve deficit.  Psychiatric:        Behavior: Behavior normal.        Thought Content: Thought content normal.        Judgment: Judgment normal.       Assessment/Plan: 1. Essential hypertension Will DC Hctz part of Lisinopril, will dc K supplement,Lisinopril 20 mg and norvasc as before   2. Hypokalemia Will  recheck for stability   3. 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 I have discussed any further diagnostic evaluation that may be needed or ordered today. We also reviewed her medications today. she has been encouraged to call the office with any questions or concerns that should arise related to todays visit.    No orders of the defined types were placed in this encounter.   Meds ordered this encounter  Medications   Zoster Vaccine Adjuvanted [facility]) injection    Sig: Inject 0.5 mLs into the muscle once for 1 dose.    Dispense:  0.5 mL    Refill:  0    Total time spent:35 Minutes Time spent includes review of chart, medications, test results, and follow up plan with the patient.   [place] Controlled Substance Database was reviewed by me.   Dr [name] Internal medicine

## 2021-07-10 ENCOUNTER — Other Ambulatory Visit: Payer: Self-pay

## 2021-07-10 DIAGNOSIS — I10 Essential (primary) hypertension: Secondary | ICD-10-CM

## 2021-07-10 MED ORDER — AMLODIPINE BESYLATE 5 MG PO TABS: 5.0000 mg | ORAL_TABLET | Freq: Every day | ORAL | 5 refills | Status: DC

## 2021-07-15 ENCOUNTER — Other Ambulatory Visit: Payer: Self-pay

## 2021-07-15 ENCOUNTER — Other Ambulatory Visit
Admission: RE | Admit: 2021-07-15 | Discharge: 2021-07-15 | Disposition: A | Discharge status: Home / Self Care | Payer: Medicare Other | Attending: Internal Medicine | Admitting: Internal Medicine

## 2021-07-15 DIAGNOSIS — E876 Hypokalemia: Secondary | ICD-10-CM

## 2021-07-15 DIAGNOSIS — I10 Essential (primary) hypertension: Secondary | ICD-10-CM

## 2021-07-15 DIAGNOSIS — E786 Lipoprotein deficiency: Secondary | ICD-10-CM | POA: Diagnosis not present

## 2021-07-15 LAB — BASIC METABOLIC PANEL
Anion gap: 12 (ref 5–15)
BUN: 6 mg/dL — ABNORMAL LOW (ref 8–23)
CO2: 28 mmol/L (ref 22–32)
Calcium: 9.4 mg/dL (ref 8.9–10.3)
Chloride: 96 mmol/L — ABNORMAL LOW (ref 98–111)
Creatinine, Ser: 0.63 mg/dL (ref 0.44–1.00)
GFR, Estimated: >60 mL/min (ref >60)
Glucose, Bld: 126 mg/dL — ABNORMAL HIGH (ref 70–99)
Potassium: 2.9 mmol/L — ABNORMAL LOW (ref 3.5–5.1)
Sodium: 136 mmol/L (ref 135–145)

## 2021-07-15 MED ORDER — LISINOPRIL 20 MG PO TABS: ORAL_TABLET | ORAL | 3 refills | Status: DC

## 2021-07-15 MED ORDER — LISINOPRIL 20 MG PO TABS: 20.0000 mg | ORAL_TABLET | Freq: Every day | ORAL | 3 refills | Status: DC

## 2021-07-15 NOTE — Progress Notes (Signed)
Stat. Need to call pt

## 2021-07-15 NOTE — Telephone Encounter (Signed)
Per [facility] called pt and had her stop the potassium and lisinopril-HCTZ and we sent in a new prescription for lisinopril 20 mg take one tab in the morning and one in the evening and re check her MetB lab next week.  New prescription was sent to pharmacy and pt informed of the changes by [facility]

## 2021-07-17 DIAGNOSIS — M545 Low back pain, unspecified: Secondary | ICD-10-CM | POA: Diagnosis not present

## 2021-07-17 DIAGNOSIS — G8929 Other chronic pain: Secondary | ICD-10-CM | POA: Diagnosis not present

## 2021-07-24 ENCOUNTER — Other Ambulatory Visit
Admission: RE | Admit: 2021-07-24 | Discharge: 2021-07-24 | Disposition: A | Discharge status: Home / Self Care | Payer: Medicare Other | Attending: Internal Medicine | Admitting: Internal Medicine

## 2021-07-24 DIAGNOSIS — M545 Low back pain, unspecified: Secondary | ICD-10-CM | POA: Diagnosis not present

## 2021-07-24 DIAGNOSIS — E876 Hypokalemia: Secondary | ICD-10-CM | POA: Diagnosis not present

## 2021-07-24 DIAGNOSIS — G8929 Other chronic pain: Secondary | ICD-10-CM | POA: Diagnosis not present

## 2021-07-24 LAB — BASIC METABOLIC PANEL
Anion gap: 10 (ref 5–15)
BUN: 7 mg/dL — ABNORMAL LOW (ref 8–23)
CO2: 29 mmol/L (ref 22–32)
Calcium: 9.2 mg/dL (ref 8.9–10.3)
Chloride: 101 mmol/L (ref 98–111)
Creatinine, Ser: 0.51 mg/dL (ref 0.44–1.00)
GFR, Estimated: >60 mL/min (ref >60)
Glucose, Bld: 109 mg/dL — ABNORMAL HIGH (ref 70–99)
Potassium: 3.5 mmol/L (ref 3.5–5.1)
Sodium: 140 mmol/L (ref 135–145)

## 2021-07-25 ENCOUNTER — Telehealth: Payer: Self-pay

## 2021-07-25 NOTE — Telephone Encounter (Signed)
-----   Message from Virginia Guise, MD sent at 07/25/2021  9:27 AM EDT ----- Her potassium is good, she continue same dose of potassium as before. Will check one more time next month

## 2021-07-25 NOTE — Telephone Encounter (Signed)
Spoke to pt and informed her potassium was good and she should continue taking potassium like before. She said she was told not to take potassium, pt needs to know if she should start taking it? She only has 3 or 4 more and may need a refill, pt has been eating bananas and other fruit.

## 2021-07-25 NOTE — Progress Notes (Signed)
Her potassium is good, she continue same dose of potassium as before. Will check one more time next month

## 2021-07-30 ENCOUNTER — Other Ambulatory Visit: Payer: Self-pay | Admitting: Internal Medicine

## 2021-07-30 DIAGNOSIS — E876 Hypokalemia: Secondary | ICD-10-CM

## 2021-07-30 DIAGNOSIS — I10 Essential (primary) hypertension: Secondary | ICD-10-CM

## 2021-07-30 NOTE — Telephone Encounter (Signed)
Spoke to pt, she said she was told by you to take 2 potassium's and then to stop before her blood work, she was told this on 7/19 when her BP med was changed per pt. She is confused and needs to know if she should start taking half tab of her potassium. She needs clarification. She has not been taking any potassium.

## 2021-07-30 NOTE — Telephone Encounter (Signed)
I spoke with pt, confusion is cleared, she is off of hctz and K supplement now, on lisinopril and norvasc for BP

## 2021-07-30 NOTE — Progress Notes (Signed)
Lab ordered is sent

## 2021-08-02 DIAGNOSIS — M545 Low back pain, unspecified: Secondary | ICD-10-CM | POA: Diagnosis not present

## 2021-08-02 DIAGNOSIS — G8929 Other chronic pain: Secondary | ICD-10-CM | POA: Diagnosis not present

## 2021-08-12 ENCOUNTER — Other Ambulatory Visit: Payer: Self-pay | Admitting: Internal Medicine

## 2021-08-15 ENCOUNTER — Encounter: Payer: Self-pay | Admitting: Nurse Practitioner

## 2021-08-15 ENCOUNTER — Other Ambulatory Visit: Payer: Self-pay

## 2021-08-15 ENCOUNTER — Ambulatory Visit (INDEPENDENT_AMBULATORY_CARE_PROVIDER_SITE_OTHER): Payer: Medicare Other | Admitting: Nurse Practitioner

## 2021-08-15 VITALS — BP 145/78 | HR 98 | Temp 97.7°F | Resp 16 | Ht 65.0 in | Wt 134.8 lb

## 2021-08-15 DIAGNOSIS — I10 Essential (primary) hypertension: Secondary | ICD-10-CM

## 2021-08-15 DIAGNOSIS — E876 Hypokalemia: Secondary | ICD-10-CM

## 2021-08-15 DIAGNOSIS — I700 Atherosclerosis of aorta: Secondary | ICD-10-CM | POA: Diagnosis not present

## 2021-08-15 DIAGNOSIS — E782 Mixed hyperlipidemia: Secondary | ICD-10-CM | POA: Diagnosis not present

## 2021-08-15 DIAGNOSIS — Z170 Estrogen receptor positive status [ER+]: Secondary | ICD-10-CM | POA: Diagnosis not present

## 2021-08-15 DIAGNOSIS — C50512 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 breast: Secondary | ICD-10-CM | POA: Diagnosis not present

## 2021-08-15 DIAGNOSIS — Z23 Encounter for immunization: Secondary | ICD-10-CM | POA: Diagnosis not present

## 2021-08-15 MED ORDER — ZOSTER VAC RECOMB ADJUVANTED 50 MCG/0.5ML IM SUSR: 0.5000 mL | Freq: Once | INTRAMUSCULAR | 0 refills | Status: AC

## 2021-08-15 MED ORDER — TETANUS-DIPHTH-ACELL PERTUSSIS 5-2.5-18.5 LF-MCG/0.5 IM SUSP: 0.5000 mL | Freq: Once | INTRAMUSCULAR | 0 refills | Status: AC

## 2021-08-15 NOTE — Progress Notes (Signed)
[facility] [place], [place] [address]  Internal MEDICINE  Office Visit Note  Patient Name: Virginia Warner  [id number]  NM:[id number]  Date of Service: 08/15/2021  Chief Complaint  Patient presents with   Follow-up    Med refill, burping a lot   Hyperlipidemia   Hypertension    HPI Verline presents for a follow up visit for medication refills and hypertension. She has passed a recent kidney stone but is feeling ok right now. She has been burping a lot, she has GERD. She is scheduled for her screening colonoscopy on Monday 08/19/21.  ---blood pressure is elevated, still elevated when rechecked, see vitals. Blood pressure was 114/90 at home this morning.  --requested shingles and tetanus vaccines.   Current Medication: Outpatient Encounter Medications as of 08/15/2021  Medication Sig Note   acetaminophen (TYLENOL) 500 MG tablet Take 500 mg by mouth daily as needed for moderate pain.     alendronate (FOSAMAX) 70 MG tablet Take 1 tablet (70 mg total) by mouth once a week. Take with a full glass of water on an empty stomach.    amLODipine (NORVASC) 5 MG tablet Take 1 tablet (5 mg total) by mouth daily.    aspirin EC 81 MG tablet Take 81 mg by mouth daily.     esomeprazole (NEXIUM) 20 MG capsule TAKE 1 CAPSULE EVERY DAY    fexofenadine (ALLEGRA) 180 MG tablet Take 180 mg by mouth daily.    gabapentin (NEURONTIN) 100 MG capsule Take 1 capsule (100 mg total) by mouth at bedtime.    latanoprost (XALATAN) 0.005 % ophthalmic solution Place 1 drop into both eyes daily.  06/26/2020: Pt states she uses whenever not always at night   letrozole (FEMARA) 2.5 MG tablet TAKE 1 TABLET BY MOUTH DAILY    lisinopril (ZESTRIL) 20 MG tablet Take 1 tablet by mouth in the morning and one in the evening    Multiple Vitamin (MULTIVITAMIN WITH MINERALS) TABS tablet Take 1 tablet by mouth daily.  06/26/2020: Pt ran out, needs to get more   [DISCONTINUED] Tdap (BOOSTRIX) 5-2.5-18.5 LF-MCG/0.5  injection Inject 0.5 mLs into the muscle once.    [DISCONTINUED] Zoster Vaccine Adjuvanted [facility]) injection Inject 0.5 mLs into the muscle once.    [EXPIRED] Tdap (BOOSTRIX) 5-2.5-18.5 LF-MCG/0.5 injection Inject 0.5 mLs into the muscle once for 1 dose.    [EXPIRED] Zoster Vaccine Adjuvanted [facility]) injection Inject 0.5 mLs into the muscle once for 1 dose.    No facility-administered encounter medications on file as of 08/15/2021.    Surgical History: Past Surgical History:  Procedure Laterality Date   ABDOMINAL HYSTERECTOMY     partial   BREAST BIOPSY Left 06/12/2020   [place] Bx, Ribbon Clip, path pending    BREAST LUMPECTOMY Left 07/04/2020   [facility] lumpectomy with radiation   BREAST LUMPECTOMY,RADIO FREQ LOCALIZER,AXILLARY SENTINEL LYMPH NODE BIOPSY Left 07/10/2020   Procedure: BREAST LUMPECTOMY,RADIO FREQ LOCALIZER,AXILLARY SENTINEL LYMPH NODE BIOPSY;  Surgeon: Jules Husbands, MD;  Location: [facility] ORS;  Service: General;  Laterality: Left;   COLONOSCOPY WITH PROPOFOL N/A 10/18/2015   Procedure: COLONOSCOPY WITH PROPOFOL;  Surgeon: Manya Silvas, MD;  Location: [facility] ENDOSCOPY;  Service: Endoscopy;  Laterality: N/A;   COLONOSCOPY WITH PROPOFOL N/A 08/19/2021   Procedure: COLONOSCOPY WITH PROPOFOL;  Surgeon: [name], MD;  Location: [facility] ENDOSCOPY;  Service: Endoscopy;  Laterality: N/A;   INGUINAL HERNIA REPAIR     age 75's   POLYPECTOMY  Medical History: Past Medical History:  Diagnosis Date   Allergy    Arthritis    Breast cancer ([place])    Hyperlipidemia    Hypertension    Seasonal allergies     Family History: Family History  Problem Relation Age of Onset   Cancer Brother    Breast cancer Paternal Grandmother     Social History   Socioeconomic History   Marital status: Divorced    Spouse name: Not on file   Number of children: Not on file   Years of education: Not on file   Highest education level: Not on file  Occupational History   Not on file   Tobacco Use   Smoking status: Former    Types: Cigarettes    Quit date: 11/2007    Years since quitting: 13.7   Smokeless tobacco: Never  Vaping Use   Vaping Use: Never used  Substance and Sexual Activity   Alcohol use: Not Currently    Alcohol/week: 0.0 standard drinks   Drug use: No   Sexual activity: Not Currently  Other Topics Concern   Not on file  Social History Narrative   Not on file   Social Determinants of Health   Financial Resource Strain: Not on file  Food Insecurity: Not on file  Transportation Needs: Not on file  Physical Activity: Not on file  Stress: Not on file  Social Connections: Not on file  Intimate Partner Violence: Not on file      Review of Systems  Constitutional:  Negative for chills, fatigue and unexpected weight change.  HENT:  Negative for congestion, rhinorrhea, sneezing and sore throat.   Eyes:  Negative for redness.  Respiratory:  Negative for cough, chest tightness and shortness of breath.   Cardiovascular:  Negative for chest pain and palpitations.  Gastrointestinal:  Negative for abdominal pain, constipation, diarrhea, nausea and vomiting.  Genitourinary:  Negative for dysuria and frequency.  Musculoskeletal:  Negative for arthralgias, back pain, joint swelling and neck pain.  Skin:  Negative for rash.  Neurological: Negative.  Negative for tremors and numbness.  Hematological:  Negative for adenopathy. Does not bruise/bleed easily.  Psychiatric/Behavioral:  Negative for behavioral problems (Depression), sleep disturbance and suicidal ideas. The patient is not nervous/anxious.    Vital Signs: BP (!) 145/78 Comment: 148/80  Pulse 98 Comment: 117  Temp 97.7 F (36.5 C)   Resp 16   Ht '5\' 5"'$  (1.651 m)   Wt 134 lb 12.8 oz (61.1 kg)   SpO2 97%   BMI 22.43 kg/m    Physical Exam Vitals reviewed.  Constitutional:      General: She is not in acute distress.    Appearance: Normal appearance. She is normal weight. She is not  ill-appearing.  HENT:     Head: Normocephalic and atraumatic.  Cardiovascular:     Rate and Rhythm: Normal rate and regular rhythm.  Pulmonary:     Effort: Pulmonary effort is normal. No respiratory distress.  Neurological:     Mental Status: She is alert and oriented to person, place, and time.  Psychiatric:        Mood and Affect: Mood normal.        Behavior: Behavior normal.     Assessment/Plan: 1. Essential hypertension Blood pressure was elevated today but stable when she checks it at home. Taking lisinopril 20 mg and amlodipine 5 mg daily.   2. 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 May consider echocardiogram  at a later date. No record of carotid ultrasound, may consider ordering imaging at next office visit.   3. Hypokalemia History of low potassium, Dr. Humphrey Rolls stopped the diuretic and potassium supplement at a previous office visit.  4. Mixed hyperlipidemia Most recent lipid panel was done in may 2022. Total cholesterol 250, and LDL 171. Atorvastatin is listed as an allergy due to causing bone pain. Encouraged patient to take a daily fish oil supplement of 1000 mg daily  5. Malignant neoplasm of lower-outer quadrant of left breast of female, estrogen receptor positive ([place]) Follow by oncology, takes letrozole  6. Need for vaccination - Zoster Vaccine Adjuvanted [facility]) injection; Inject 0.5 mLs into the muscle once for 1 dose.  Dispense: 0.5 mL; Refill: 0 - Tdap (BOOSTRIX) 5-2.5-18.5 LF-MCG/0.5 injection; Inject 0.5 mLs into the muscle once for 1 dose.  Dispense: 0.5 mL; Refill: 0   General Counseling: Emma verbalizes understanding of the findings of todays visit and agrees with plan of treatment. I have discussed any further diagnostic evaluation that may be needed or ordered today. We also reviewed her medications today. she has been encouraged to call the office with any questions or concerns that should arise related to  todays visit.    No orders of the defined types were placed in this encounter.   Meds ordered this encounter  Medications   Zoster Vaccine Adjuvanted [facility]) injection    Sig: Inject 0.5 mLs into the muscle once for 1 dose.    Dispense:  0.5 mL    Refill:  0   Tdap (BOOSTRIX) 5-2.5-18.5 LF-MCG/0.5 injection    Sig: Inject 0.5 mLs into the muscle once for 1 dose.    Dispense:  0.5 mL    Refill:  0    Return in about 6 months (around 02/15/2022) for F/U, med refill, Zacari Radick PCP.   Total time spent:20 Minutes Time spent includes review of chart, medications, test results, and follow up plan with the patient.   [place] Controlled Substance Database was reviewed by me.  This patient was seen by Jonetta Osgood, FNP-C in collaboration with Dr. Clayborn Bigness as a part of collaborative care agreement.   Toluwani Yadav R. Valetta Fuller, MSN, FNP-C Internal medicine

## 2021-08-16 ENCOUNTER — Encounter: Payer: Self-pay | Admitting: *Deleted

## 2021-08-16 DIAGNOSIS — M545 Low back pain, unspecified: Secondary | ICD-10-CM | POA: Diagnosis not present

## 2021-08-16 DIAGNOSIS — G8929 Other chronic pain: Secondary | ICD-10-CM | POA: Diagnosis not present

## 2021-08-19 ENCOUNTER — Ambulatory Visit: Payer: Medicare Other | Admitting: Certified Registered Nurse Anesthetist

## 2021-08-19 ENCOUNTER — Encounter: Payer: Self-pay | Admitting: *Deleted

## 2021-08-19 ENCOUNTER — Ambulatory Visit
Admission: RE | Admit: 2021-08-19 | Discharge: 2021-08-19 | Disposition: A | Discharge status: Home / Self Care | Payer: Medicare Other | Attending: Gastroenterology | Admitting: Gastroenterology

## 2021-08-19 ENCOUNTER — Encounter
Admission: RE | Disposition: A | Discharge status: Home / Self Care | Payer: Self-pay | Source: Home / Self Care | Attending: Gastroenterology

## 2021-08-19 DIAGNOSIS — Z86010 Personal history of colonic polyps: Secondary | ICD-10-CM | POA: Diagnosis not present

## 2021-08-19 DIAGNOSIS — Z7982 Long term (current) use of aspirin: Secondary | ICD-10-CM | POA: Diagnosis not present

## 2021-08-19 DIAGNOSIS — Z7983 Long term (current) use of bisphosphonates: Secondary | ICD-10-CM | POA: Insufficient documentation

## 2021-08-19 DIAGNOSIS — Z79811 Long term (current) use of aromatase inhibitors: Secondary | ICD-10-CM | POA: Diagnosis not present

## 2021-08-19 DIAGNOSIS — Z1211 Encounter for screening for malignant neoplasm of colon: Secondary | ICD-10-CM | POA: Diagnosis not present

## 2021-08-19 DIAGNOSIS — Z888 Allergy status to other drugs, medicaments and biological substances status: Secondary | ICD-10-CM | POA: Insufficient documentation

## 2021-08-19 DIAGNOSIS — K640 First degree hemorrhoids: Secondary | ICD-10-CM | POA: Diagnosis not present

## 2021-08-19 DIAGNOSIS — Z79899 Other long term (current) drug therapy: Secondary | ICD-10-CM | POA: Insufficient documentation

## 2021-08-19 DIAGNOSIS — C50919 Malignant neoplasm of unspecified site of unspecified female breast: Secondary | ICD-10-CM | POA: Diagnosis not present

## 2021-08-19 DIAGNOSIS — K5730 Diverticulosis of large intestine without perforation or abscess without bleeding: Secondary | ICD-10-CM | POA: Diagnosis not present

## 2021-08-19 DIAGNOSIS — K649 Unspecified hemorrhoids: Secondary | ICD-10-CM | POA: Diagnosis not present

## 2021-08-19 DIAGNOSIS — Z886 Allergy status to analgesic agent status: Secondary | ICD-10-CM | POA: Diagnosis not present

## 2021-08-19 HISTORY — PX: COLONOSCOPY WITH PROPOFOL: SHX5780

## 2021-08-19 SURGERY — COLONOSCOPY WITH PROPOFOL
Anesthesia: General

## 2021-08-19 MED ORDER — PROPOFOL 500 MG/50ML IV EMUL
INTRAVENOUS | Status: DC | PRN
  Administered 2021-08-19: 160 ug/kg/min via INTRAVENOUS

## 2021-08-19 MED ORDER — SODIUM CHLORIDE 0.9 % IV SOLN: INTRAVENOUS | Status: DC

## 2021-08-19 MED ORDER — PROPOFOL 10 MG/ML IV BOLUS
INTRAVENOUS | Status: DC | PRN
  Administered 2021-08-19: 40 mg via INTRAVENOUS
  Administered 2021-08-19: 30 mg via INTRAVENOUS
  Administered 2021-08-19: 20 mg via INTRAVENOUS

## 2021-08-19 NOTE — Anesthesia Preprocedure Evaluation (Signed)
Anesthesia Evaluation  Patient identified by MRN, date of birth, ID band Patient awake    Reviewed: Allergy & Precautions, NPO status , Patient's Chart, lab work & pertinent test results  History of Anesthesia Complications Negative for: history of anesthetic complications  Airway Mallampati: II  TM Distance: >3 FB Neck ROM: Full    Dental no notable dental hx. (+) Teeth Intact   Pulmonary neg pulmonary ROS, neg sleep apnea, neg COPD, Patient abstained from smoking.Not current smoker, former smoker,    Pulmonary exam normal breath sounds clear to auscultation       Cardiovascular Exercise Tolerance: Good METShypertension, (-) CAD and (-) Past MI (-) dysrhythmias  Rhythm:Regular Rate:Normal - Systolic murmurs    Neuro/Psych negative neurological ROS  negative psych ROS   GI/Hepatic GERD  ,(+)     (-) substance abuse  ,   Endo/Other  neg diabetes  Renal/GU negative Renal ROS     Musculoskeletal   Abdominal   Peds  Hematology   Anesthesia Other Findings Past Medical History: No date: Allergy No date: Arthritis No date: Breast cancer ([place]) No date: Hyperlipidemia No date: Hypertension No date: Seasonal allergies  Reproductive/Obstetrics                             Anesthesia Physical Anesthesia Plan  ASA: 2  Anesthesia Plan: General   Post-op Pain Management:    Induction: Intravenous  PONV Risk Score and Plan: 3 and Ondansetron, Propofol infusion and TIVA  Airway Management Planned: Nasal Cannula  Additional Equipment: None  Intra-op Plan:   Post-operative Plan:   Informed Consent: I have reviewed the patients History and Physical, chart, labs and discussed the procedure including the risks, benefits and alternatives for the proposed anesthesia with the patient or authorized representative who has indicated his/her understanding and acceptance.     Dental advisory  given  Plan Discussed with: CRNA and Surgeon  Anesthesia Plan Comments: (Discussed risks of anesthesia with patient, including possibility of difficulty with spontaneous ventilation under anesthesia necessitating airway intervention, PONV, and rare risks such as cardiac or respiratory or neurological events, and allergic reactions. Patient understands.)        Anesthesia Quick Evaluation

## 2021-08-19 NOTE — Transfer of Care (Signed)
Immediate Anesthesia Transfer of Care Note  Patient: Virginia Warner  Procedure(s) Performed: COLONOSCOPY WITH PROPOFOL  Patient Location: PACU  Anesthesia Type:General  Level of Consciousness: awake and alert   Airway & Oxygen Therapy: Patient Spontanous Breathing and Patient connected to nasal cannula oxygen  Post-op Assessment: Report given to RN and Post -op Vital signs reviewed and stable  Post vital signs: Reviewed and stable  Last Vitals:  Vitals Value Taken Time  BP    Temp    Pulse 86 08/19/21 1034  Resp 13 08/19/21 1034  SpO2 100 % 08/19/21 1034  Vitals shown include unvalidated device data.  Last Pain:  Vitals:   08/19/21 0909  TempSrc: Temporal  PainSc: 0-No pain         Complications: No notable events documented.

## 2021-08-19 NOTE — Op Note (Signed)
[facility] Gastroenterology Patient Name: Virginia Warner Procedure Date: 08/19/2021 9:53 AM MRN: [id number] Account #: [id number] Date of Birth: 1946/05/28 Admit Type: Outpatient Age: 75 Room: [facility] ENDO ROOM 3 Gender: Female Note Status: Finalized Procedure:             Colonoscopy Indications:           Surveillance: Personal history of adenomatous polyps                         on last colonoscopy > 5 years ago Providers:             Andrey Farmer MD, MD Referring MD:          [name], MD (Referring MD) Medicines:             Monitored Anesthesia Care Complications:         No immediate complications. Procedure:             Pre-Anesthesia Assessment:                        - Prior to the procedure, a History and Physical was                         performed, and patient medications and allergies were                         reviewed. The patient is competent. The risks and                         benefits of the procedure and the sedation options and                         risks were discussed with the patient. All questions                         were answered and informed consent was obtained.                         Patient identification and proposed procedure were                         verified by the physician, the nurse, the anesthetist                         and the technician in the endoscopy suite. Mental                         Status Examination: alert and oriented. Airway                         Examination: normal oropharyngeal airway and neck                         mobility. Respiratory Examination: clear to                         auscultation. CV Examination: normal. Prophylactic  Antibiotics: The patient does not require prophylactic                         antibiotics. Prior Anticoagulants: The patient has                         taken no previous anticoagulant or antiplatelet                          agents. ASA Grade Assessment: II - A patient with mild                         systemic disease. After reviewing the risks and                         benefits, the patient was deemed in satisfactory                         condition to undergo the procedure. The anesthesia                         plan was to use monitored anesthesia care (MAC).                         Immediately prior to administration of medications,                         the patient was re-assessed for adequacy to receive                         sedatives. The heart rate, respiratory rate, oxygen                         saturations, blood pressure, adequacy of pulmonary                         ventilation, and response to care were monitored                         throughout the procedure. The physical status of the                         patient was re-assessed after the procedure.                        After obtaining informed consent, the colonoscope was                         passed under direct vision. Throughout the procedure,                         the patient's blood pressure, pulse, and oxygen                         saturations were monitored continuously. The                         Colonoscope was introduced through the anus and  advanced to the the cecum, identified by appendiceal                         orifice and ileocecal valve. The colonoscopy was                         performed without difficulty. The patient tolerated                         the procedure well. The quality of the bowel                         preparation was good. Findings:      The perianal and digital rectal examinations were normal.      A few small-mouthed diverticula were found in the sigmoid colon.      Internal hemorrhoids were found during retroflexion. The hemorrhoids       were Grade I (internal hemorrhoids that do not prolapse).      The exam was otherwise without abnormality on direct and  retroflexion       views. Impression:            - Diverticulosis in the sigmoid colon.                        - Internal hemorrhoids.                        - The examination was otherwise normal on direct and                         retroflexion views.                        - No specimens collected. Recommendation:        - Discharge patient to home.                        - Resume previous diet.                        - Continue present medications.                        - Repeat colonoscopy is not recommended due to current                         age (69 years or older) for surveillance.                        - Return to referring physician as previously                         scheduled. Procedure Code(s):     --- Professional ---                        Z6606, Colorectal cancer screening; colonoscopy on                         individual at high risk Diagnosis Code(s):     --- Professional ---  Z86.010, Personal history of colonic polyps                        K64.0, First degree hemorrhoids                        K57.30, Diverticulosis of large intestine without                         perforation or abscess without bleeding CPT copyright 2019 American Medical Association. All rights reserved. The codes documented in this report are preliminary and upon coder review may  be revised to meet current compliance requirements. Andrey Farmer MD, MD 08/19/2021 10:32:28 AM Number of Addenda: 0 Note Initiated On: 08/19/2021 9:53 AM Scope Withdrawal Time: 0 hours 8 minutes 5 seconds  Total Procedure Duration: 0 hours 16 minutes 3 seconds  Estimated Blood Loss:  Estimated blood loss: none.      [facility]

## 2021-08-19 NOTE — Anesthesia Postprocedure Evaluation (Signed)
Anesthesia Post Note  Patient: Virginia Warner  Procedure(s) Performed: COLONOSCOPY WITH PROPOFOL  Patient location during evaluation: Endoscopy Anesthesia Type: General Level of consciousness: awake and alert Pain management: pain level controlled Vital Signs Assessment: post-procedure vital signs reviewed and stable Respiratory status: spontaneous breathing, nonlabored ventilation, respiratory function stable and patient connected to nasal cannula oxygen Cardiovascular status: blood pressure returned to baseline and stable Postop Assessment: no apparent nausea or vomiting Anesthetic complications: no   No notable events documented.   Last Vitals:  Vitals:   08/19/21 1045 08/19/21 1053  BP: 125/81 123/79  Pulse: 78 63  Resp: 19 14  Temp:    SpO2: 99% 94%    Last Pain:  Vitals:   08/19/21 1053  TempSrc:   PainSc: 4                  Arita Miss

## 2021-08-19 NOTE — Interval H&P Note (Signed)
History and Physical Interval Note:  08/19/2021 10:05 AM  Virginia Warner  has presented today for surgery, with the diagnosis of P HX POLYPS.  The various methods of treatment have been discussed with the patient and family. After consideration of risks, benefits and other options for treatment, the patient has consented to  Procedure(s): COLONOSCOPY WITH PROPOFOL (N/A) as a surgical intervention.  The patient's history has been reviewed, patient examined, no change in status, stable for surgery.  I have reviewed the patient's chart and labs.  Questions were answered to the patient's satisfaction.     [name]  Ok to proceed with colonoscopy

## 2021-08-19 NOTE — H&P (Signed)
Outpatient short stay form Pre-procedure 08/19/2021  Virginia Rubenstein, MD  Primary Physician: Virginia Guise, MD  Reason for visit:  Surveillance colonoscopy  History of present illness:   75 y/o lady with history of small adenomatous polyps on colonoscopy done 6 years ago here for surveillance colonoscopy. No blood thinners. History of hysterectomy. No family history of GI malignancies.    Current Facility-Administered Medications:    0.9 %  sodium chloride infusion, , Intravenous, Continuous, Sharma Lawrance, Hilton Cork, MD, Last Rate: 20 mL/hr at 08/19/21 0951, Continued from Pre-op at 08/19/21 0951  Medications Prior to Admission  Medication Sig Dispense Refill Last Dose   amLODipine (NORVASC) 5 MG tablet Take 1 tablet (5 mg total) by mouth daily. 30 tablet 5 08/19/2021 at 0555   fluticasone (FLONASE) 50 MCG/ACT nasal spray Place 2 sprays into both nostrils as needed for allergies or rhinitis.      gabapentin (NEURONTIN) 100 MG capsule Take 1 capsule (100 mg total) by mouth at bedtime. 90 capsule 0 08/18/2021   letrozole (FEMARA) 2.5 MG tablet TAKE 1 TABLET BY MOUTH DAILY 30 tablet 1 08/18/2021   lisinopril (ZESTRIL) 20 MG tablet Take 1 tablet by mouth in the morning and one in the evening 180 tablet 3 08/18/2021   losartan-hydrochlorothiazide (HYZAAR) 50-12.5 MG tablet Take 1 tablet by mouth daily.      Multiple Vitamin (MULTIVITAMIN) tablet Take 1 tablet by mouth daily.   Past Week   acetaminophen (TYLENOL) 500 MG tablet Take 500 mg by mouth daily as needed for moderate pain.       alendronate (FOSAMAX) 70 MG tablet Take 1 tablet (70 mg total) by mouth once a week. Take with a full glass of water on an empty stomach. 4 tablet 3    aspirin EC 81 MG tablet Take 81 mg by mouth daily.    08/16/2021   esomeprazole (NEXIUM) 20 MG capsule TAKE 1 CAPSULE EVERY DAY 90 capsule 3    fexofenadine (ALLEGRA) 180 MG tablet Take 180 mg by mouth daily.      latanoprost (XALATAN) 0.005 % ophthalmic solution  Place 1 drop into both eyes daily.       Multiple Vitamin (MULTIVITAMIN WITH MINERALS) TABS tablet Take 1 tablet by mouth daily.       potassium chloride SA (KLOR-CON) 20 MEQ tablet Take 20 mEq by mouth 2 (two) times daily. (Patient not taking: Reported on 08/19/2021)   Not Taking   [START ON 08/21/2021] Tdap (BOOSTRIX) 5-2.5-18.5 LF-MCG/0.5 injection Inject 0.5 mLs into the muscle once for 1 dose. 0.5 mL 0    [START ON 08/21/2021] Zoster Vaccine Adjuvanted [facility]) injection Inject 0.5 mLs into the muscle once for 1 dose. 0.5 mL 0      Allergies  Allergen Reactions   Aspirin Nausea And Vomiting    Cannot tolerate unless enteric coated   Atorvastatin Other (See Comments)    Bone pain     Past Medical History:  Diagnosis Date   Allergy    Arthritis    Breast cancer ([place])    Hyperlipidemia    Hypertension    Seasonal allergies     Review of systems:  Otherwise negative.    Physical Exam  Gen: Alert, oriented. Appears stated age.  HEENT: PERRLA. Lungs: No respiratory distress CV: RRR Abd: soft, benign, no masses Ext: No edema    Planned procedures: Proceed with colonoscopy. The patient understands the nature of the planned procedure, indications, risks, alternatives and potential complications including  but not limited to bleeding, infection, perforation, damage to internal organs and possible oversedation/side effects from anesthesia. The patient agrees and gives consent to proceed.  Please refer to procedure notes for findings, recommendations and patient disposition/instructions.     Virginia Rubenstein, MD [facility] Gastroenterology

## 2021-08-20 ENCOUNTER — Encounter: Payer: Self-pay | Admitting: Gastroenterology

## 2021-08-21 ENCOUNTER — Ambulatory Visit
Admission: RE | Admit: 2021-08-21 | Discharge: 2021-08-21 | Disposition: A | Discharge status: Home / Self Care | Payer: Medicare Other | Source: Ambulatory Visit | Attending: Radiation Oncology | Admitting: Radiation Oncology

## 2021-08-21 ENCOUNTER — Telehealth: Payer: Self-pay

## 2021-08-21 VITALS — BP 161/81 | HR 91 | Temp 97.5°F | Resp 16 | Wt 135.1 lb

## 2021-08-21 DIAGNOSIS — Z923 Personal history of irradiation: Secondary | ICD-10-CM | POA: Diagnosis not present

## 2021-08-21 DIAGNOSIS — Z170 Estrogen receptor positive status [ER+]: Secondary | ICD-10-CM | POA: Diagnosis not present

## 2021-08-21 DIAGNOSIS — C50512 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 breast: Secondary | ICD-10-CM | POA: Diagnosis not present

## 2021-08-21 DIAGNOSIS — Z79811 Long term (current) use of aromatase inhibitors: Secondary | ICD-10-CM | POA: Insufficient documentation

## 2021-08-21 DIAGNOSIS — Z08 Encounter for follow-up examination after completed treatment for malignant neoplasm: Secondary | ICD-10-CM | POA: Diagnosis not present

## 2021-08-21 DIAGNOSIS — Z853 Personal history of malignant neoplasm of breast: Secondary | ICD-10-CM | POA: Diagnosis not present

## 2021-08-21 NOTE — Progress Notes (Signed)
Radiation Oncology Follow up Note  Name: Virginia Warner   Date:   08/21/2021 MRN:  [id number] DOB: August 20, 1946    This 75 y.o. 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  Seen today in routine follow-up she is doing well.  She specifically denies breast tenderness cough or bone pain she is had slight distortion of her breast from her surgery low cosmetic result is still good.  She is currently on.  Letrozole tolerating that well without side effect.  She had mammograms back in June which I have reviewed were BI-RADS 2 benign.  COMPLICATIONS OF TREATMENT: none  FOLLOW UP COMPLIANCE: keeps appointments   PHYSICAL EXAM:  BP (!) 161/81 (BP Location: Left Arm, Patient Position: Sitting)   Pulse 91   Temp (!) 97.5 F (36.4 C) (Tympanic)   Resp 16   Wt 135 lb 1.6 oz (61.3 kg)   BMI 22.48 kg/m  Lungs are clear to A&P cardiac examination essentially unremarkable with regular rate and rhythm. No dominant mass or nodularity is noted in either breast in 2 positions examined. Incision is well-healed. No axillary or supraclavicular adenopathy is appreciated. Cosmetic result is good.  There is still some slight distortion secondary to her surgery.  Well-developed well-nourished patient in NAD. HEENT reveals PERLA, EOMI, discs not visualized.  Oral cavity is clear. No oral mucosal lesions are identified. Neck is clear without evidence of cervical or supraclavicular adenopathy. Lungs are clear to A&P. Cardiac examination is essentially unremarkable with regular rate and rhythm without murmur rub or thrill. Abdomen is benign with no organomegaly or masses noted. Motor  sensory and DTR levels are equal and symmetric in the upper and lower extremities. Cranial nerves II through XII are grossly intact. Proprioception is intact. No peripheral adenopathy or edema is identified. No motor or sensory levels are noted. Crude visual fields are within normal range. RADIOLOGY RESULTS: Mammograms reviewed compatible with above-stated findings  PLAN: Present time patient is doing well with no evidence of disease 1 year out from whole breast radiation and pleased with her overall progress.  She continues on letrozole without side effect.  Patient knows to call with any concerns I will see her back in 1 year for follow-up.  I would like to take this opportunity to thank you for allowing me to participate in the care of your patient.Noreene Filbert, MD

## 2021-08-21 NOTE — Telephone Encounter (Signed)
PA sent for Esomeprazole 20 mg

## 2021-08-26 ENCOUNTER — Telehealth: Payer: Self-pay

## 2021-08-26 NOTE — Telephone Encounter (Signed)
LMOM to get clarification on her blood pressure meds

## 2021-08-26 NOTE — Telephone Encounter (Signed)
She is taking lisinopril 20 mg daily and amlodipine 5 mg daily, she is NOT taking losartan-hydrochlorothiazide. Pt would like to have her potassium checked within the next month.

## 2021-08-26 NOTE — Telephone Encounter (Signed)
-----   Message from Jonetta Osgood, NP sent at 08/25/2021  9:23 PM EDT ----- Regarding: clarification Rosezella [place],  Can you call Valinda and clarify that she is taking lisinopril 20 mg daily and amlodipine 5 mg daily for high blood pressure. Please ask her if she is taking losartan-hydrochlorothiazide. It popped up on her med list when I was finishing her last visit note and that combo medication was not prescribed by [place] nor does she see cardiology. If she says that she is taking it tell her to stop taking it and to only take lisinopril and amlodipine.   Thanks, [name]

## 2021-08-26 NOTE — Telephone Encounter (Signed)
-----   Message from Jonetta Osgood, NP sent at 08/25/2021  9:23 PM EDT ----- Regarding: clarification Rosezella [place],  Can you call Dystiny and clarify that she is taking lisinopril 20 mg daily and amlodipine 5 mg daily for high blood pressure. Please ask her if she is taking losartan-hydrochlorothiazide. It popped up on her med list when I was finishing her last visit note and that combo medication was not prescribed by [place] nor does she see cardiology. If she says that she is taking it tell her to stop taking it and to only take lisinopril and amlodipine.   Thanks, [name]

## 2021-08-27 ENCOUNTER — Other Ambulatory Visit: Payer: Self-pay | Admitting: Nurse Practitioner

## 2021-08-27 DIAGNOSIS — E876 Hypokalemia: Secondary | ICD-10-CM

## 2021-08-27 NOTE — Telephone Encounter (Signed)
Spoke to pt, she will go next week to [facility]

## 2021-09-03 ENCOUNTER — Encounter: Payer: Self-pay | Admitting: Nurse Practitioner

## 2021-09-03 ENCOUNTER — Ambulatory Visit (INDEPENDENT_AMBULATORY_CARE_PROVIDER_SITE_OTHER): Payer: Medicare Other | Admitting: Nurse Practitioner

## 2021-09-03 ENCOUNTER — Other Ambulatory Visit: Payer: Self-pay

## 2021-09-03 VITALS — BP 122/82 | HR 97 | Temp 98.6°F | Resp 16 | Ht 65.0 in | Wt 135.4 lb

## 2021-09-03 DIAGNOSIS — G8929 Other chronic pain: Secondary | ICD-10-CM | POA: Diagnosis not present

## 2021-09-03 DIAGNOSIS — M25562 Pain in left knee: Secondary | ICD-10-CM | POA: Diagnosis not present

## 2021-09-03 DIAGNOSIS — K219 Gastro-esophageal reflux disease without esophagitis: Secondary | ICD-10-CM | POA: Diagnosis not present

## 2021-09-03 MED ORDER — FAMOTIDINE 20 MG PO TABS: 20.0000 mg | ORAL_TABLET | Freq: Every day | ORAL | 2 refills | Status: DC

## 2021-09-03 MED ORDER — FAMOTIDINE 40 MG PO TABS: 40.0000 mg | ORAL_TABLET | Freq: Every day | ORAL | 2 refills | Status: DC

## 2021-09-03 NOTE — Progress Notes (Signed)
[facility], [place] [address]  Internal MEDICINE  Office Visit Note  Patient Name: Virginia Warner  [id number]  [id number]  Date of Service: 09/03/2021  Chief Complaint  Patient presents with   Acute Visit    Left knee pain   Gastroesophageal Reflux    Burping a lot, gas pressure     HPI Beautifull presents for an acute sick visit for GERD and reports she is burping a lot and having gas pressure. She is taking esomeprazole. She has tried Gas-X and Tums. She also has left knee pain, chronic.    Current Medication: Outpatient Encounter Medications as of 09/03/2021  Medication Sig Note   acetaminophen (TYLENOL) 500 MG tablet Take 500 mg by mouth daily as needed for moderate pain.     alendronate (FOSAMAX) 70 MG tablet Take 1 tablet (70 mg total) by mouth once a week. Take with a full glass of water on an empty stomach.    amLODipine (NORVASC) 5 MG tablet Take 1 tablet (5 mg total) by mouth daily.    aspirin EC 81 MG tablet Take 81 mg by mouth daily.     esomeprazole (NEXIUM) 20 MG capsule TAKE 1 CAPSULE EVERY DAY    fexofenadine (ALLEGRA) 180 MG tablet Take 180 mg by mouth daily.    fluticasone (FLONASE) 50 MCG/ACT nasal spray Place 2 sprays into both nostrils as needed for allergies or rhinitis.    gabapentin (NEURONTIN) 100 MG capsule Take 1 capsule (100 mg total) by mouth at bedtime.    latanoprost (XALATAN) 0.005 % ophthalmic solution Place 1 drop into both eyes daily.  06/26/2020: Pt states she uses whenever not always at night   letrozole (FEMARA) 2.5 MG tablet TAKE 1 TABLET BY MOUTH DAILY    lisinopril (ZESTRIL) 20 MG tablet Take 1 tablet by mouth in the morning and one in the evening    Multiple Vitamin (MULTIVITAMIN WITH MINERALS) TABS tablet Take 1 tablet by mouth daily.  06/26/2020: Pt ran out, needs to get more   Multiple Vitamin (MULTIVITAMIN) tablet Take 1 tablet by mouth daily.    [DISCONTINUED] famotidine (PEPCID) 40 MG tablet Take 1 tablet  (40 mg total) by mouth daily.    famotidine (PEPCID) 20 MG tablet Take 1 tablet (20 mg total) by mouth daily.    No facility-administered encounter medications on file as of 09/03/2021.    Surgical History: Past Surgical History:  Procedure Laterality Date   ABDOMINAL HYSTERECTOMY     partial   BREAST BIOPSY Left 06/12/2020   [place] Bx, Ribbon Clip, path pending    BREAST LUMPECTOMY Left 07/04/2020   [facility] lumpectomy with radiation   BREAST LUMPECTOMY,RADIO FREQ LOCALIZER,AXILLARY SENTINEL LYMPH NODE BIOPSY Left 07/10/2020   Procedure: BREAST LUMPECTOMY,RADIO FREQ LOCALIZER,AXILLARY SENTINEL LYMPH NODE BIOPSY;  Surgeon: Jules Husbands, MD;  Location: [facility] ORS;  Service: General;  Laterality: Left;   COLONOSCOPY WITH PROPOFOL N/A 10/18/2015   Procedure: COLONOSCOPY WITH PROPOFOL;  Surgeon: Manya Silvas, MD;  Location: [facility] ENDOSCOPY;  Service: Endoscopy;  Laterality: N/A;   COLONOSCOPY WITH PROPOFOL N/A 08/19/2021   Procedure: COLONOSCOPY WITH PROPOFOL;  Surgeon: [name], MD;  Location: [facility] ENDOSCOPY;  Service: Endoscopy;  Laterality: N/A;   INGUINAL HERNIA REPAIR     age 89's   POLYPECTOMY      Medical History: Past Medical History:  Diagnosis Date   Allergy    Arthritis    Breast cancer ([place])    Hyperlipidemia  Hypertension    Seasonal allergies     Family History: Family History  Problem Relation Age of Onset   Cancer Brother    Breast cancer Paternal Grandmother     Social History   Socioeconomic History   Marital status: Divorced    Spouse name: Not on file   Number of children: Not on file   Years of education: Not on file   Highest education level: Not on file  Occupational History   Not on file  Tobacco Use   Smoking status: Former    Types: Cigarettes    Quit date: 11/2007    Years since quitting: 13.8   Smokeless tobacco: Never  Vaping Use   Vaping Use: Never used  Substance and Sexual Activity   Alcohol use: Not Currently     Alcohol/week: 0.0 standard drinks   Drug use: No   Sexual activity: Not Currently  Other Topics Concern   Not on file  Social History Narrative   Not on file   Social Determinants of Health   Financial Resource Strain: Not on file  Food Insecurity: Not on file  Transportation Needs: Not on file  Physical Activity: Not on file  Stress: Not on file  Social Connections: Not on file  Intimate Partner Violence: Not on file      Review of Systems  Constitutional:  Negative for chills, fatigue and unexpected weight change.  HENT:  Negative for congestion, rhinorrhea, sneezing and sore throat.   Eyes:  Negative for redness.  Respiratory:  Negative for cough, chest tightness and shortness of breath.   Cardiovascular:  Negative for chest pain and palpitations.  Gastrointestinal:  Positive for abdominal pain (gas pains and dyspepsia). Negative for constipation, diarrhea, nausea and vomiting.  Genitourinary:  Negative for dysuria and frequency.  Musculoskeletal:  Positive for arthralgias (left knee pain). Negative for back pain, joint swelling and neck pain.  Skin:  Negative for rash.  Neurological: Negative.  Negative for tremors and numbness.  Hematological:  Negative for adenopathy. Does not bruise/bleed easily.  Psychiatric/Behavioral:  Negative for behavioral problems (Depression), sleep disturbance and suicidal ideas. The patient is not nervous/anxious.    Vital Signs: BP 122/82 Comment: blood pressure at home 122/82, in office 150/86  Pulse 97   Temp 98.6 F (37 C)   Resp 16   Ht '5\' 5"'$  (1.651 m)   Wt 135 lb 6.4 oz (61.4 kg)   SpO2 98%   BMI 22.53 kg/m    Physical Exam Vitals reviewed.  Constitutional:      General: She is not in acute distress.    Appearance: Normal appearance. She is normal weight. She is not ill-appearing.  HENT:     Head: Normocephalic and atraumatic.  Eyes:     Extraocular Movements: Extraocular movements intact.     Pupils: Pupils are equal,  round, and reactive to light.  Cardiovascular:     Rate and Rhythm: Normal rate and regular rhythm.  Pulmonary:     Effort: Pulmonary effort is normal. No respiratory distress.  Neurological:     Mental Status: She is alert and oriented to person, place, and time.     Cranial Nerves: No cranial nerve deficit.     Coordination: Coordination normal.     Gait: Gait normal.  Psychiatric:        Mood and Affect: Mood normal.        Behavior: Behavior normal.     Assessment/Plan: 1. Gastroesophageal reflux disease without esophagitis  Add famotidine, continue esomeprazole. Call the clinic if symptoms worsen or do not improve Within 2 weeks.  - famotidine (PEPCID) 20 MG tablet; Take 1 tablet (20 mg total) by mouth daily.  Dispense: 30 tablet; Refill: 2  2. Chronic pain of left knee Stable with OTC medication, no intervention needed.    General Counseling: carianne handcock understanding of the findings of todays visit and agrees with plan of treatment. I have discussed any further diagnostic evaluation that may be needed or ordered today. We also reviewed her medications today. she has been encouraged to call the office with any questions or concerns that should arise related to todays visit.    No orders of the defined types were placed in this encounter.   Meds ordered this encounter  Medications   DISCONTD: famotidine (PEPCID) 40 MG tablet    Sig: Take 1 tablet (40 mg total) by mouth daily.    Dispense:  30 tablet    Refill:  2   famotidine (PEPCID) 20 MG tablet    Sig: Take 1 tablet (20 mg total) by mouth daily.    Dispense:  30 tablet    Refill:  2    Return if symptoms worsen or fail to improve.   Total time spent:20 Minutes Time spent includes review of chart, medications, test results, and follow up plan with the patient.   [place] Controlled Substance Database was reviewed by me.  This patient was seen by Jonetta Osgood, FNP-C in collaboration with Dr. Clayborn Bigness as a  part of collaborative care agreement.   Ladashia Demarinis R. Valetta Fuller, MSN, FNP-C Internal medicine

## 2021-09-10 ENCOUNTER — Ambulatory Visit: Payer: Medicare Other | Admitting: Physician Assistant

## 2021-09-16 ENCOUNTER — Ambulatory Visit (INDEPENDENT_AMBULATORY_CARE_PROVIDER_SITE_OTHER): Payer: Medicare Other | Admitting: Physician Assistant

## 2021-09-16 ENCOUNTER — Encounter: Payer: Self-pay | Admitting: Physician Assistant

## 2021-09-16 ENCOUNTER — Encounter: Payer: Self-pay | Admitting: Nurse Practitioner

## 2021-09-16 ENCOUNTER — Ambulatory Visit
Admission: RE | Admit: 2021-09-16 | Discharge: 2021-09-16 | Disposition: A | Discharge status: Home / Self Care | Payer: Medicare Other | Source: Ambulatory Visit | Attending: Urology | Admitting: Urology

## 2021-09-16 ENCOUNTER — Telehealth: Payer: Self-pay

## 2021-09-16 ENCOUNTER — Other Ambulatory Visit: Payer: Self-pay

## 2021-09-16 VITALS — BP 167/96 | HR 51 | Ht 65.0 in | Wt 129.0 lb

## 2021-09-16 DIAGNOSIS — N200 Calculus of kidney: Secondary | ICD-10-CM | POA: Diagnosis not present

## 2021-09-16 DIAGNOSIS — R310 Gross hematuria: Secondary | ICD-10-CM

## 2021-09-16 NOTE — Progress Notes (Signed)
09/16/2021 4:59 PM   Virginia Warner April 25, 1946 [id number]  CC: Chief Complaint  Patient presents with   Nephrolithiasis   Hematuria   HPI: Virginia Warner is a 75 y.o. female with PMH hematuria and left renal stones who presents today for stone follow-up.   Today she reports doing well with no flank pain or gross hematuria since she was last seen in clinic by Dr. Bernardo Heater 6 months ago.  She notes an occasional fleck of debris in her underwear and wonders if this may represent passage of stone fragments.  She underwent KUB today which revealed stable appearing left lower pole nephrolithiasis.  In-office UA today positive for 1+ blood and 2+ leukocyte esterase; urine microscopy with 11-30 WBCs/HPF.  PMH: Past Medical History:  Diagnosis Date   Allergy    Arthritis    Breast cancer ([place])    Hyperlipidemia    Hypertension    Seasonal allergies     Surgical History: Past Surgical History:  Procedure Laterality Date   ABDOMINAL HYSTERECTOMY     partial   BREAST BIOPSY Left 06/12/2020   [place] Bx, Ribbon Clip, path pending    BREAST LUMPECTOMY Left 07/04/2020   [facility] lumpectomy with radiation   BREAST LUMPECTOMY,RADIO FREQ LOCALIZER,AXILLARY SENTINEL LYMPH NODE BIOPSY Left 07/10/2020   Procedure: BREAST LUMPECTOMY,RADIO FREQ LOCALIZER,AXILLARY SENTINEL LYMPH NODE BIOPSY;  Surgeon: Jules Husbands, MD;  Location: [facility] ORS;  Service: General;  Laterality: Left;   COLONOSCOPY WITH PROPOFOL N/A 10/18/2015   Procedure: COLONOSCOPY WITH PROPOFOL;  Surgeon: Manya Silvas, MD;  Location: [facility] ENDOSCOPY;  Service: Endoscopy;  Laterality: N/A;   COLONOSCOPY WITH PROPOFOL N/A 08/19/2021   Procedure: COLONOSCOPY WITH PROPOFOL;  Surgeon: [name], MD;  Location: [facility] ENDOSCOPY;  Service: Endoscopy;  Laterality: N/A;   INGUINAL HERNIA REPAIR     age 67's   POLYPECTOMY      Home Medications:  Allergies as of 09/16/2021       Reactions   Aspirin Nausea And Vomiting   Cannot  tolerate unless enteric coated   Atorvastatin Other (See Comments)   Bone pain        Medication List        Accurate as of September 16, 2021  4:59 PM. If you have any questions, ask your nurse or doctor.          acetaminophen 500 MG tablet Commonly known as: TYLENOL Take 500 mg by mouth daily as needed for moderate pain.   alendronate 70 MG tablet Commonly known as: FOSAMAX Take 1 tablet (70 mg total) by mouth once a week. Take with a full glass of water on an empty stomach.   amLODipine 5 MG tablet Commonly known as: NORVASC Take 1 tablet (5 mg total) by mouth daily.   aspirin EC 81 MG tablet Take 81 mg by mouth daily.   esomeprazole 20 MG capsule Commonly known as: NEXIUM TAKE 1 CAPSULE EVERY DAY   famotidine 20 MG tablet Commonly known as: PEPCID Take 1 tablet (20 mg total) by mouth daily.   fexofenadine 180 MG tablet Commonly known as: ALLEGRA Take 180 mg by mouth daily.   fluticasone 50 MCG/ACT nasal spray Commonly known as: FLONASE Place 2 sprays into both nostrils as needed for allergies or rhinitis.   gabapentin 100 MG capsule Commonly known as: NEURONTIN Take 1 capsule (100 mg total) by mouth at bedtime.   latanoprost 0.005 % ophthalmic solution Commonly known as: XALATAN Place 1 drop into both  eyes daily.   letrozole 2.5 MG tablet Commonly known as: FEMARA TAKE 1 TABLET BY MOUTH DAILY   lisinopril 20 MG tablet Commonly known as: ZESTRIL Take 1 tablet by mouth in the morning and one in the evening   multivitamin tablet Take 1 tablet by mouth daily.   multivitamin with minerals Tabs tablet Take 1 tablet by mouth daily.        Allergies:  Allergies  Allergen Reactions   Aspirin Nausea And Vomiting    Cannot tolerate unless enteric coated   Atorvastatin Other (See Comments)    Bone pain    Family History: Family History  Problem Relation Age of Onset   Cancer Brother    Breast cancer Paternal Grandmother     Social  History:   reports that she quit smoking about 13 years ago. Her smoking use included cigarettes. She has never used smokeless tobacco. She reports that she does not currently use alcohol. She reports that she does not use drugs.  Physical Exam: BP (!) 167/96   Pulse (!) 51   Ht 5\' 5"  (1.651 m)   Wt 129 lb (58.5 kg)   BMI 21.47 kg/m   Constitutional:  Alert and oriented, no acute distress, nontoxic appearing HEENT: [place], AT Cardiovascular: No clubbing, cyanosis, or edema Respiratory: Normal respiratory effort, no increased work of breathing Skin: No rashes, bruises or suspicious lesions Neurologic: Grossly intact, no focal deficits, moving all 4 extremities Psychiatric: Normal mood and affect  Laboratory Data: Results for orders placed or performed in visit on 09/16/21  Microscopic Examination   Urine  Result Value Ref Range   WBC, UA 11-30 (A) 0 - 5 /hpf   RBC 0-2 0 - 2 /hpf   Epithelial Cells (non renal) 0-10 0 - 10 /hpf   Bacteria, UA None seen None seen/Few  Urinalysis, Complete  Result Value Ref Range   Specific Gravity, UA <1.005 (L) 1.005 - 1.030   pH, UA 6.0 5.0 - 7.5   Color, UA Yellow Yellow   Appearance Ur Hazy (A) Clear   Leukocytes,UA 2+ (A) Negative   Protein,UA Negative Negative/Trace   Glucose, UA Negative Negative   Ketones, UA Negative Negative   RBC, UA 1+ (A) Negative   Bilirubin, UA Negative Negative   Urobilinogen, Ur 0.2 0.2 - 1.0 mg/dL   Nitrite, UA Negative Negative   Microscopic Examination See below:    Pertinent Imaging: KUB, 09/16/2021: CLINICAL DATA:  Kidney stone.   EXAM: ABDOMEN - 1 VIEW   COMPARISON:  CT abdomen pelvis dated 03/05/2021.   FINDINGS: Several radiopaque calculi noted over the inferior pole the left renal silhouette with the largest measuring up to 5 mm.   No bowel dilatation or evidence of obstruction. No free air. Degenerative changes of the spine. No acute osseous pathology.   IMPRESSION: Left renal calculi.      Electronically Signed   By: Anner Crete M.D.   On: 09/16/2021 22:19  I personally reviewed the images referenced above and note stable appearing left lower pole nonobstructing renal stones.  Assessment & Plan:   1. Kidney stones Stable appearing on KUB.  UA today rather bland.  Counseled her on return precautions including acute flank pain, nausea, vomiting, and gross hematuria. - Urinalysis, Complete  2. Gross hematuria No blood on UA today.  Counseled her on return precautions including dysuria, flank pain, and gross hematuria.  She expressed understanding.  Return in about 1 year (around 09/16/2022) for Annual stone visit  with UA + KUB prior.  Debroah Loop, PA-C  [facility] [address], [place], [place] [address] [phone]

## 2021-09-16 NOTE — Telephone Encounter (Signed)
Patient walked in to office requesting to take both Tylenol and her antiacid medication. Per Desha advised patient that is okay.

## 2021-09-17 LAB — URINALYSIS, COMPLETE
Bilirubin, UA: NEGATIVE
Glucose, UA: NEGATIVE
Ketones, UA: NEGATIVE
Nitrite, UA: NEGATIVE
Protein,UA: NEGATIVE
Specific Gravity, UA: <1.005 — ABNORMAL LOW (ref 1.005–1.030)
Urobilinogen, Ur: 0.2 mg/dL (ref 0.2–1.0)
pH, UA: 6 (ref 5.0–7.5)

## 2021-09-17 LAB — MICROSCOPIC EXAMINATION: Bacteria, UA: NONE SEEN

## 2021-09-18 ENCOUNTER — Other Ambulatory Visit: Payer: Self-pay | Admitting: Internal Medicine

## 2021-09-18 DIAGNOSIS — M810 Age-related osteoporosis without current pathological fracture: Secondary | ICD-10-CM

## 2021-09-20 ENCOUNTER — Other Ambulatory Visit: Payer: Self-pay

## 2021-09-20 ENCOUNTER — Ambulatory Visit (INDEPENDENT_AMBULATORY_CARE_PROVIDER_SITE_OTHER): Payer: Medicare Other | Admitting: Physician Assistant

## 2021-09-20 ENCOUNTER — Encounter: Payer: Self-pay | Admitting: Physician Assistant

## 2021-09-20 DIAGNOSIS — R040 Epistaxis: Secondary | ICD-10-CM

## 2021-09-20 DIAGNOSIS — I10 Essential (primary) hypertension: Secondary | ICD-10-CM

## 2021-09-20 DIAGNOSIS — J0110 Acute frontal sinusitis, unspecified: Secondary | ICD-10-CM | POA: Diagnosis not present

## 2021-09-20 MED ORDER — AZITHROMYCIN 250 MG PO TABS: ORAL_TABLET | ORAL | 0 refills | Status: AC

## 2021-09-20 NOTE — Progress Notes (Signed)
[facility] [place], [place] [address]  Internal MEDICINE  Office Visit Note  Patient Name: Virginia Warner  [id number]  [id number]  Date of Service: 09/22/2021  Chief Complaint  Patient presents with   Allergies    Had nose bleed      HPI Pt is here for a sick visit. -years ago had a nose bleed and went to the hospital for it but was fine. And occasionally willl have nose bleeds -About 10 days ago had another nose bleed and has been worried why she gets these at times. -Denies any severe nosebleeds, those that cannot be controlled within a reasonable timeframe.  Patient does admit that she has previously been told by EMS to use Afrin to help stop nosebleeds in the future if they occur it is too afraid to use this.  She says she was also told at one point to use Vaseline in her nostrils to help prevent dryness -She stopped flonase, and allegra due to concern that these were drying her out too much.  Discussed that Flonase can cause nosebleeds and thinning of sinus membranes and that she should continue to hold use however she should restart an antihistamine due to current sinus symptoms that seem to be triggering additional bleeds due to constant blowing of her nose.  Patient also admits that she may have actually caused more damage using her fingernail in her nostril and advised that she not pick at any dried blood and should blow her nose gently -sinus pressure with some congestion the past few days.  We will go ahead and start on a short Z-Pak and restart on an antihistamine, Allegra or Zyrtec.  Patient should also use saline spray to help keep moisture and prevent dryness that leads to nosebleeds.   -Patient reassured was also advised that she needs to keep her blood pressure under control.  She does get anxious when she comes into doctors offices especially with the recent nosebleed increasing her anxiety  Current Medication:  Outpatient Encounter  Medications as of 09/20/2021  Medication Sig Note   acetaminophen (TYLENOL) 500 MG tablet Take 500 mg by mouth daily as needed for moderate pain.     alendronate (FOSAMAX) 70 MG tablet TAKE 1 TABLET EVERY 7 DAYS WITH A FULL GLASS OF WATER ON AN EMPTY STOMACH DO NOT LIE DOWN FOR AT LEAST 30 MIN    amLODipine (NORVASC) 5 MG tablet Take 1 tablet (5 mg total) by mouth daily.    aspirin EC 81 MG tablet Take 81 mg by mouth daily.     azithromycin (ZITHROMAX) 250 MG tablet Take 2 tablets on day 1, then 1 tablet daily on days 2 through 5    esomeprazole (NEXIUM) 20 MG capsule TAKE 1 CAPSULE EVERY DAY    famotidine (PEPCID) 20 MG tablet Take 1 tablet (20 mg total) by mouth daily.    fexofenadine (ALLEGRA) 180 MG tablet Take 180 mg by mouth daily.    fluticasone (FLONASE) 50 MCG/ACT nasal spray Place 2 sprays into both nostrils as needed for allergies or rhinitis.    gabapentin (NEURONTIN) 100 MG capsule Take 1 capsule (100 mg total) by mouth at bedtime.    latanoprost (XALATAN) 0.005 % ophthalmic solution Place 1 drop into both eyes daily.  06/26/2020: Pt states she uses whenever not always at night   lisinopril (ZESTRIL) 20 MG tablet Take 1 tablet by mouth in the morning and one in the evening    Multiple Vitamin (  MULTIVITAMIN WITH MINERALS) TABS tablet Take 1 tablet by mouth daily.  06/26/2020: Pt ran out, needs to get more   Multiple Vitamin (MULTIVITAMIN) tablet Take 1 tablet by mouth daily.    [DISCONTINUED] letrozole ([place]) 2.5 MG tablet TAKE 1 TABLET BY MOUTH DAILY    No facility-administered encounter medications on file as of 09/20/2021.      Medical History: Past Medical History:  Diagnosis Date   Allergy    Arthritis    Breast cancer ([place])    Hyperlipidemia    Hypertension    Seasonal allergies      Vital Signs: BP (!) 142/84 Comment: 158/88  Pulse (!) 103   Temp 98.2 F (36.8 C)   Resp 16   Ht 5' 5.5" (1.664 m)   Wt 134 lb (60.8 kg)   SpO2 96%   BMI 21.96 kg/m     Review of Systems  Constitutional:  Negative for fatigue and fever.  HENT:  Positive for congestion, nosebleeds, postnasal drip and rhinorrhea. Negative for mouth sores.   Respiratory:  Negative for cough.   Cardiovascular:  Negative for chest pain.  Genitourinary:  Negative for flank pain.  Neurological:  Negative for headaches.  Psychiatric/Behavioral: Negative.     Physical Exam Vitals and nursing note reviewed.  Constitutional:      General: She is not in acute distress.    Appearance: She is well-developed and normal weight. She is not diaphoretic.  HENT:     Head: Normocephalic and atraumatic.     Nose: Rhinorrhea present.     Comments: Erythematous with small area of dried blood and rhinorrhea    Mouth/Throat:     Pharynx: Posterior oropharyngeal erythema present. No oropharyngeal exudate.  Eyes:     Pupils: Pupils are equal, round, and reactive to light.  Neck:     Thyroid: No thyromegaly.     Vascular: No JVD.     Trachea: No tracheal deviation.  Cardiovascular:     Rate and Rhythm: Normal rate and regular rhythm.     Heart sounds: Normal heart sounds. No murmur heard.   No friction rub. No gallop.  Pulmonary:     Effort: Pulmonary effort is normal. No respiratory distress.     Breath sounds: No wheezing or rales.  Chest:     Chest wall: No tenderness.  Abdominal:     General: Bowel sounds are normal.     Palpations: Abdomen is soft.  Musculoskeletal:        General: Normal range of motion.     Cervical back: Normal range of motion and neck supple.  Lymphadenopathy:     Cervical: No cervical adenopathy.  Skin:    General: Skin is warm and dry.  Neurological:     Mental Status: She is alert and oriented to person, place, and time.     Cranial Nerves: No cranial nerve deficit.  Psychiatric:        Thought Content: Thought content normal.        Judgment: Judgment normal.     Comments: Pt is anxious      Assessment/Plan: 1. Acute non-recurrent  frontal sinusitis Will start on Z-Pak and restart antihistamine either Allegra or Zyrtec.  Patient should avoid Flonase due to recent bloody nose and instead use saline as needed - azithromycin (ZITHROMAX) 250 MG tablet; Take 2 tablets on day 1, then 1 tablet daily on days 2 through 5  Dispense: 6 tablet; Refill: 0  2. Bleeding from the nose  Resolved. Advised to avoid Flonase and instead use saline as needed.  Patient also advised to avoid picking at any dried blood and blow nose gently as there may be a scab from previous bleed trying to heal  3. Essential hypertension BP/HR initially elevated due to patient being anxious upon coming into office however improved on recheck.  Patient will continue to monitor at home where it is  normally well controlled   General Counseling: Sandy Salaam understanding of the findings of todays visit and agrees with plan of treatment. I have discussed any further diagnostic evaluation that may be needed or ordered today. We also reviewed her medications today. she has been encouraged to call the office with any questions or concerns that should arise related to todays visit.    Counseling:    No orders of the defined types were placed in this encounter.   Meds ordered this encounter  Medications   azithromycin (ZITHROMAX) 250 MG tablet    Sig: Take 2 tablets on day 1, then 1 tablet daily on days 2 through 5    Dispense:  6 tablet    Refill:  0     Time spent:30 Minutes

## 2021-09-21 ENCOUNTER — Other Ambulatory Visit: Payer: Self-pay | Admitting: Oncology

## 2021-09-25 DIAGNOSIS — M1712 Unilateral primary osteoarthritis, left knee: Secondary | ICD-10-CM | POA: Diagnosis not present

## 2021-09-25 DIAGNOSIS — M5442 Lumbago with sciatica, left side: Secondary | ICD-10-CM | POA: Diagnosis not present

## 2021-09-26 ENCOUNTER — Other Ambulatory Visit
Admission: RE | Admit: 2021-09-26 | Discharge: 2021-09-26 | Disposition: A | Discharge status: Home / Self Care | Payer: Medicare Other | Attending: Nurse Practitioner | Admitting: Nurse Practitioner

## 2021-09-26 ENCOUNTER — Other Ambulatory Visit: Payer: Self-pay

## 2021-09-26 ENCOUNTER — Ambulatory Visit (INDEPENDENT_AMBULATORY_CARE_PROVIDER_SITE_OTHER): Payer: Medicare Other | Admitting: Internal Medicine

## 2021-09-26 ENCOUNTER — Encounter: Payer: Self-pay | Admitting: Internal Medicine

## 2021-09-26 VITALS — BP 168/94 | HR 108 | Temp 97.8°F | Resp 16 | Ht 65.0 in | Wt 136.4 lb

## 2021-09-26 DIAGNOSIS — R000 Tachycardia, unspecified: Secondary | ICD-10-CM | POA: Diagnosis not present

## 2021-09-26 DIAGNOSIS — I10 Essential (primary) hypertension: Secondary | ICD-10-CM

## 2021-09-26 DIAGNOSIS — F411 Generalized anxiety disorder: Secondary | ICD-10-CM

## 2021-09-26 DIAGNOSIS — E876 Hypokalemia: Secondary | ICD-10-CM | POA: Diagnosis not present

## 2021-09-26 DIAGNOSIS — Z23 Encounter for immunization: Secondary | ICD-10-CM | POA: Diagnosis not present

## 2021-09-26 LAB — POTASSIUM: Potassium: 3.9 mmol/L (ref 3.5–5.1)

## 2021-09-26 MED ORDER — DILTIAZEM HCL ER COATED BEADS 120 MG PO CP24: 120.0000 mg | ORAL_CAPSULE | Freq: Every day | ORAL | 1 refills | Status: DC

## 2021-09-26 MED ORDER — CLONAZEPAM 0.5 MG PO TABS: ORAL_TABLET | ORAL | 1 refills | Status: DC

## 2021-09-26 NOTE — Progress Notes (Signed)
[facility] [place], [place] [address]  Internal MEDICINE  Office Visit Note  Patient Name: Virginia Warner  [id number]  [id number]  Date of Service: 09/30/2021  Chief Complaint  Patient presents with   Acute Visit    High BP    Hyperlipidemia   Hypertension    HPI Pt is here with concerns of elevated blood pressure. She gets anxious at times and blood pressure goes up. Her heart rate is elevated as well. HCTZ was stopped due to hypokalemia. 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     alendronate (FOSAMAX) 70 MG tablet TAKE 1 TABLET EVERY 7 DAYS WITH A FULL GLASS OF WATER ON AN EMPTY STOMACH DO NOT LIE DOWN FOR AT LEAST 30 MIN    aspirin EC 81 MG tablet Take 81 mg by mouth daily.     clonazePAM (KLONOPIN) 0.5 MG tablet Take half to one tab po bid for anxiety    diltiazem (CARDIZEM CD) 120 MG 24 hr capsule Take 1 capsule (120 mg total) by mouth daily.    esomeprazole (NEXIUM) 20 MG capsule TAKE 1 CAPSULE EVERY DAY    famotidine (PEPCID) 20 MG tablet Take 1 tablet (20 mg total) by mouth daily.    fexofenadine (ALLEGRA) 180 MG tablet Take 180 mg by mouth daily.    fluticasone (FLONASE) 50 MCG/ACT nasal spray Place 2 sprays into both nostrils as needed for allergies or rhinitis.    gabapentin (NEURONTIN) 100 MG capsule  Take 1 capsule (100 mg total) by mouth at bedtime.    latanoprost (XALATAN) 0.005 % ophthalmic solution Place 1 drop into both eyes daily.  06/26/2020: Pt states she uses whenever not always at night   letrozole ([place]) 2.5 MG tablet TAKE 1 TABLET BY MOUTH DAILY    lisinopril (ZESTRIL) 20 MG tablet Take 1 tablet by mouth in the morning and one in the evening    Multiple Vitamin (MULTIVITAMIN WITH MINERALS) TABS tablet Take 1 tablet by mouth daily.  06/26/2020: Pt ran out, needs to get more   Multiple Vitamin (MULTIVITAMIN) tablet Take 1 tablet by mouth daily.    [DISCONTINUED] amLODipine (NORVASC) 5 MG tablet Take 1 tablet (5 mg total) by mouth daily.    No facility-administered encounter medications on file as of 09/26/2021.    Surgical History: Past Surgical History:  Procedure Laterality Date   ABDOMINAL HYSTERECTOMY     partial   BREAST BIOPSY Left 06/12/2020   [place] Bx, Ribbon Clip, path pending    BREAST LUMPECTOMY Left 07/04/2020   [facility] lumpectomy with radiation   BREAST LUMPECTOMY,RADIO FREQ LOCALIZER,AXILLARY SENTINEL LYMPH NODE BIOPSY Left 07/10/2020   Procedure: BREAST LUMPECTOMY,RADIO FREQ LOCALIZER,AXILLARY SENTINEL LYMPH NODE BIOPSY;  Surgeon: Jules Husbands, MD;  Location: [facility] ORS;  Service: General;  Laterality: Left;   COLONOSCOPY WITH PROPOFOL N/A 10/18/2015  Procedure: COLONOSCOPY WITH PROPOFOL;  Surgeon: Manya Silvas, MD;  Location: [facility] ENDOSCOPY;  Service: Endoscopy;  Laterality: N/A;   COLONOSCOPY WITH PROPOFOL N/A 08/19/2021   Procedure: COLONOSCOPY WITH PROPOFOL;  Surgeon: [name], MD;  Location: [facility] ENDOSCOPY;  Service: Endoscopy;  Laterality: N/A;   INGUINAL HERNIA REPAIR     age 64's   POLYPECTOMY      Medical History: Past Medical History:  Diagnosis Date   Allergy    Arthritis    Breast cancer ([place])    Hyperlipidemia    Hypertension    Seasonal allergies     Family History: Family History  Problem Relation Age of Onset   Cancer  Brother    Breast cancer Paternal Grandmother     Social History   Socioeconomic History   Marital status: Divorced    Spouse name: Not on file   Number of children: Not on file   Years of education: Not on file   Highest education level: Not on file  Occupational History   Not on file  Tobacco Use   Smoking status: Former    Types: Cigarettes    Quit date: 11/2007    Years since quitting: 13.8   Smokeless tobacco: Never  Vaping Use   Vaping Use: Never used  Substance and Sexual Activity   Alcohol use: Not Currently    Alcohol/week: 0.0 standard drinks   Drug use: No   Sexual activity: Not Currently  Other Topics Concern   Not on file  Social History Narrative   Not on file   Social Determinants of Health   Financial Resource Strain: Not on file  Food Insecurity: Not on file  Transportation Needs: Not on file  Physical Activity: Not on file  Stress: Not on file  Social Connections: Not on file  Intimate Partner Violence: Not on file      Review of Systems  Constitutional:  Negative for fatigue and fever.  HENT:  Negative for congestion, mouth sores and postnasal drip.   Respiratory:  Negative for cough.   Cardiovascular:  Negative for chest pain.  Genitourinary:  Negative for flank pain.  Psychiatric/Behavioral: Negative.     Vital Signs: BP (!) 168/94   Pulse (!) 108   Temp 97.8 F (36.6 C)   Resp 16   Ht 5' 5" (1.651 m)   Wt 136 lb 6.4 oz (61.9 kg)   SpO2 98%   BMI 22.70 kg/m    Physical Exam Constitutional:      Appearance: Normal appearance.  HENT:     Head: Normocephalic and atraumatic.     Nose: Nose normal.     Mouth/Throat:     Mouth: Mucous membranes are moist.     Pharynx: No posterior oropharyngeal erythema.  Eyes:     Extraocular Movements: Extraocular movements intact.     Pupils: Pupils are equal, round, and reactive to light.  Cardiovascular:     Pulses: Normal pulses.     Heart sounds: Normal heart sounds.  Pulmonary:      Effort: Pulmonary effort is normal.     Breath sounds: Normal breath sounds.  Neurological:     General: No focal deficit present.     Mental Status: She is alert.  Psychiatric:        Mood and Affect: Mood normal.        Behavior: Behavior normal.       Assessment/Plan: 1. Uncontrolled hypertension Continue Lisinopril 20 mg bid. DC Norvasc  for now, add Cardizem CD 120 mg once a day   2. GAD (generalized anxiety disorder) Add low dose Klonopin prn only, if needed SSRI   3. Tachycardia Cardizem will help with tachycardia, if pt continues to be symptomatic, will update thyroid functions, and might need echo    General Counseling: Sandy Salaam understanding of the findings of todays visit and agrees with plan of treatment. I have discussed any further diagnostic evaluation that may be needed or ordered today. We also reviewed her medications today. she has been encouraged to call the office with any questions or concerns that should arise related to todays visit.    No orders of the defined types were placed in this encounter.   Meds ordered this encounter  Medications   diltiazem (CARDIZEM CD) 120 MG 24 hr capsule    Sig: Take 1 capsule (120 mg total) by mouth daily.    Dispense:  30 capsule    Refill:  1   clonazePAM (KLONOPIN) 0.5 MG tablet    Sig: Take half to one tab po bid for anxiety    Dispense:  20 tablet    Refill:  1    Total time spent:30 Minutes Time spent includes review of chart, medications, test results, and follow up plan with the patient.   [place] Controlled Substance Database was reviewed by me.   Dr [name] Internal medicine

## 2021-10-01 ENCOUNTER — Other Ambulatory Visit: Payer: Self-pay

## 2021-10-01 ENCOUNTER — Inpatient Hospital Stay: Payer: Medicare Other | Attending: Nurse Practitioner | Admitting: Nurse Practitioner

## 2021-10-01 ENCOUNTER — Encounter: Payer: Self-pay | Admitting: Nurse Practitioner

## 2021-10-01 VITALS — BP 159/84 | HR 99 | Temp 98.1°F | Resp 16 | Ht 65.0 in | Wt 134.9 lb

## 2021-10-01 DIAGNOSIS — Z79811 Long term (current) use of aromatase inhibitors: Secondary | ICD-10-CM | POA: Diagnosis not present

## 2021-10-01 DIAGNOSIS — Z08 Encounter for follow-up examination after completed treatment for malignant neoplasm: Secondary | ICD-10-CM | POA: Diagnosis not present

## 2021-10-01 DIAGNOSIS — C50912 Malignant neoplasm of unspecified site of left female breast: Secondary | ICD-10-CM | POA: Diagnosis not present

## 2021-10-01 DIAGNOSIS — Z853 Personal history of malignant neoplasm of breast: Secondary | ICD-10-CM | POA: Diagnosis not present

## 2021-10-01 DIAGNOSIS — Z170 Estrogen receptor positive status [ER+]: Secondary | ICD-10-CM | POA: Diagnosis not present

## 2021-10-01 NOTE — Progress Notes (Signed)
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  This was followed by diagnostic mammogram and ultrasound which showed a 7 x 7 x 4 mm mass 3 cm from the nipple at the 7 o'clock position.  Normal-appearing left axillary lymph nodes.  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  ER strongly positive greater than 90%, PR 1 to 10% positive and HER-2 negative   Patient underwent lumpectomy and sentinel lymph node biopsy on 07/10/2020.  Final pathology showed 8 mm grade 1 invasive mucinous carcinoma with negative margins.  Sentinel lymph nodes were negative for malignancy.   Patient completed adjuvant radiation therapy and started letrozole in November 2021  Interval history-patient is 75 year old female who returns to clinic for routine follow-up and surveillance of breast cancer.  Continues letrozole.  Tolerating well without significant side effects.  No new lumps or bumps.  She continues to need steroid injections in her knees.   Appetite and weight are stable.  ECOG PS- 1 Pain scale- 0   Review of systems- Review of Systems  Constitutional:  Negative for chills, fever, malaise/fatigue and weight loss.  HENT:  Negative for congestion, ear discharge and nosebleeds.   Eyes:  Negative for blurred vision.  Respiratory:  Negative for cough, hemoptysis, sputum production, shortness of breath and wheezing.   Cardiovascular:  Negative for chest pain, palpitations, orthopnea and claudication.  Gastrointestinal:  Negative for abdominal pain, blood in stool, constipation, diarrhea, heartburn, melena, nausea and vomiting.  Genitourinary:  Negative for dysuria, flank pain, frequency, hematuria and urgency.  Musculoskeletal:  Positive for joint pain. Negative for back pain and myalgias.  Skin:  Negative for rash.  Neurological:  Negative for dizziness, tingling, focal weakness, seizures, weakness and headaches.  Endo/Heme/Allergies:  Does not bruise/bleed easily.  Psychiatric/Behavioral:  Negative for depression and suicidal ideas. The patient does not have insomnia.      Allergies  Allergen Reactions   Aspirin Nausea And Vomiting    Cannot tolerate unless enteric coated   Atorvastatin Other (See Comments)    Bone pain    Past Medical History:  Diagnosis Date   Allergy    Arthritis    Breast cancer ([place])    Hyperlipidemia    Hypertension    Seasonal allergies     Past Surgical History:  Procedure Laterality Date   ABDOMINAL HYSTERECTOMY     partial   BREAST BIOPSY Left 06/12/2020   [place] Bx, Ribbon Clip, path pending    BREAST LUMPECTOMY Left 07/04/2020   [facility] lumpectomy with radiation   BREAST LUMPECTOMY,RADIO FREQ [place] BIOPSY Left 07/10/2020   Procedure: BREAST LUMPECTOMY,RADIO FREQ LOCALIZER,AXILLARY SENTINEL LYMPH  NODE BIOPSY;  Surgeon: Jules Husbands, MD;  Location: [facility] ORS;  Service: General;  Laterality: Left;   COLONOSCOPY WITH PROPOFOL N/A 10/18/2015   Procedure:  COLONOSCOPY WITH PROPOFOL;  Surgeon: Manya Silvas, MD;  Location: [facility] ENDOSCOPY;  Service: Endoscopy;  Laterality: N/A;   COLONOSCOPY WITH PROPOFOL N/A 08/19/2021   Procedure: COLONOSCOPY WITH PROPOFOL;  Surgeon: [name], MD;  Location: [facility] ENDOSCOPY;  Service: Endoscopy;  Laterality: N/A;   INGUINAL HERNIA REPAIR     age 7's   POLYPECTOMY      Social History   Socioeconomic History   Marital status: Divorced    Spouse name: Not on file   Number of children: Not on file   Years of education: Not on file   Highest education level: Not on file  Occupational History   Not on file  Tobacco Use   Smoking status: Former    Types: Cigarettes    Quit date: 11/2007    Years since quitting: 13.8   Smokeless tobacco: Never  Vaping Use   Vaping Use: Never used  Substance and Sexual Activity   Alcohol use: Not Currently    Alcohol/week: 0.0 standard drinks   Drug use: No   Sexual activity: Not Currently  Other Topics Concern   Not on file  Social History Narrative   Not on file   Social Determinants of Health   Financial Resource Strain: Not on file  Food Insecurity: Not on file  Transportation Needs: Not on file  Physical Activity: Not on file  Stress: Not on file  Social Connections: Not on file  Intimate Partner Violence: Not on file    Family History  Problem Relation Age of Onset   Cancer Brother    Breast cancer Paternal Grandmother     Current Outpatient Medications:    acetaminophen (TYLENOL) 500 MG tablet, Take 500 mg by mouth daily as needed for moderate pain. , Disp: , Rfl:    alendronate (FOSAMAX) 70 MG tablet, TAKE 1 TABLET EVERY 7 DAYS WITH A FULL GLASS OF WATER ON AN EMPTY STOMACH DO NOT LIE DOWN FOR AT LEAST 30 MIN, Disp: 4 tablet, Rfl: 3   aspirin EC 81 MG tablet, Take 81 mg by mouth daily. , Disp: , Rfl:    clonazePAM (KLONOPIN) 0.5 MG tablet, Take half to one tab po bid for anxiety, Disp: 20 tablet, Rfl: 1   diltiazem (CARDIZEM CD) 120  MG 24 hr capsule, Take 1 capsule (120 mg total) by mouth daily., Disp: 30 capsule, Rfl: 1   esomeprazole (NEXIUM) 20 MG capsule, TAKE 1 CAPSULE EVERY DAY, Disp: 90 capsule, Rfl: 3   famotidine (PEPCID) 20 MG tablet, Take 1 tablet (20 mg total) by mouth daily., Disp: 30 tablet, Rfl: 2   fexofenadine (ALLEGRA) 180 MG tablet, Take 180 mg by mouth daily., Disp: , Rfl:    fluticasone (FLONASE) 50 MCG/ACT nasal spray, Place 2 sprays into both nostrils as needed for allergies or rhinitis., Disp: , Rfl:    gabapentin (NEURONTIN) 100 MG capsule, Take 1 capsule (100 mg total) by mouth at bedtime., Disp: 90 capsule, Rfl: 0   latanoprost (XALATAN) 0.005 % ophthalmic solution, Place 1 drop into both eyes daily. , Disp: , Rfl:    letrozole (FEMARA) 2.5 MG tablet, TAKE 1 TABLET BY MOUTH DAILY, Disp: 30 tablet, Rfl: 1   lisinopril (ZESTRIL) 20 MG tablet, Take 1 tablet by mouth in the morning and one in the evening, Disp: 180 tablet,  Rfl: 3   Multiple Vitamin (MULTIVITAMIN WITH MINERALS) TABS tablet, Take 1 tablet by mouth daily. , Disp: , Rfl:    Multiple Vitamin (MULTIVITAMIN) tablet, Take 1 tablet by mouth daily., Disp: , Rfl:   Physical exam:  Vitals:   10/01/21 1006 10/01/21 1012  BP: (!) 159/84 (!) 159/84  Pulse: 99   Resp: 16   Temp: 98.1 F (36.7 C)   TempSrc: Oral   Weight: 134 lb 14.4 oz (61.2 kg)   Height: '5\' 5"'  (1.651 m)    Physical Exam Constitutional:      General: She is not in acute distress. Cardiovascular:     Rate and Rhythm: Normal rate and regular rhythm.     Heart sounds: Normal heart sounds.  Pulmonary:     Effort: Pulmonary effort is normal.     Breath sounds: Normal breath sounds.  Abdominal:     General: Bowel sounds are normal.     Palpations: Abdomen is soft.  Skin:    General: Skin is warm and dry.  Neurological:     Mental Status: She is alert and oriented to person, place, and time.  Breast exam: declined   CMP Latest Ref Rng & Units 09/26/2021  Glucose 70 -  99 mg/dL -  BUN 8 - 23 mg/dL -  Creatinine 0.44 - 1.00 mg/dL -  Sodium 135 - 145 mmol/L -  Potassium 3.5 - 5.1 mmol/L 3.9  Chloride 98 - 111 mmol/L -  CO2 22 - 32 mmol/L -  Calcium 8.9 - 10.3 mg/dL -  Total Protein 6.0 - 8.5 g/dL -  Total Bilirubin 0.0 - 1.2 mg/dL -  Alkaline Phos 44 - 121 IU/L -  AST 0 - 40 IU/L -  ALT 0 - 32 IU/L -   CBC Latest Ref Rng & Units 04/01/2021  WBC 4.0 - 10.5 K/uL 5.0  Hemoglobin 12.0 - 15.0 g/dL 12.6  Hematocrit 36.0 - 46.0 % 39.2  Platelets 150 - 400 K/uL 348    No images are attached to the encounter.  Abdomen 1 view (KUB)  Result Date: 09/16/2021 CLINICAL DATA:  Kidney stone. EXAM: ABDOMEN - 1 VIEW COMPARISON:  CT abdomen pelvis dated 03/05/2021. FINDINGS: Several radiopaque calculi noted over the inferior pole the left renal silhouette with the largest measuring up to 5 mm. No bowel dilatation or evidence of obstruction. No free air. Degenerative changes of the spine. No acute osseous pathology. IMPRESSION: Left renal calculi. Electronically Signed   By: Anner Crete M.D.   On: 09/16/2021 22:19     Assessment and plan- Patient is a 75 y.o. 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   She is s/p adjuvant radiation therapy and currently on letrozole and this is a routine follow-up visit  Clinically, patient is doing well without signs or symptoms of recurrence.  Plan to repeat mammogram in June 2023 which is coordinated by Dr. Corlis Leak office.  Given ER/PR positivity of her tumor she will benefit from at least 5 years of adjuvant antiestrogen therapy.  Osteoporosis- per 2021 bone density scan. On fosamax per pcp.   Rtc in 6 months for follow-up with Dr. Janese Banks   Visit Diagnosis 1. Encounter for follow-up surveillance of breast cancer    Beckey Rutter, [place], AGNP-C [place] at [facility] [phone] (clinic) 10/01/2021

## 2021-10-01 NOTE — Progress Notes (Signed)
Left knee, bones hurt at time

## 2021-10-09 DIAGNOSIS — M1712 Unilateral primary osteoarthritis, left knee: Secondary | ICD-10-CM | POA: Diagnosis not present

## 2021-10-14 ENCOUNTER — Encounter: Payer: Self-pay | Admitting: Nurse Practitioner

## 2021-10-14 ENCOUNTER — Other Ambulatory Visit: Payer: Self-pay

## 2021-10-14 ENCOUNTER — Ambulatory Visit (INDEPENDENT_AMBULATORY_CARE_PROVIDER_SITE_OTHER): Payer: Medicare Other | Admitting: Nurse Practitioner

## 2021-10-14 ENCOUNTER — Ambulatory Visit: Payer: Medicare Other | Admitting: Internal Medicine

## 2021-10-14 VITALS — BP 120/80 | HR 82 | Temp 98.3°F | Resp 16 | Ht 65.0 in | Wt 134.0 lb

## 2021-10-14 DIAGNOSIS — I10 Essential (primary) hypertension: Secondary | ICD-10-CM

## 2021-10-14 DIAGNOSIS — F411 Generalized anxiety disorder: Secondary | ICD-10-CM

## 2021-10-14 NOTE — Progress Notes (Signed)
[facility] [place], [place] [address]  Internal MEDICINE  Office Visit Note  Patient Name: Virginia Warner  [id number]  [id number]  Date of Service: 10/14/2021  Chief Complaint  Patient presents with   Hypertension    2 wek f/u    HPI Rochelle presents for a follow up visit for hypertension. Laelia's previous office visit was on 09/26/21 with Dr. Clayborn Bigness with uncontrolled hypertension and she discontinued the amlodipine and started her on diltiazem. Her blood pressure is better controlled today.      Current Medication: Outpatient Encounter Medications as of 10/14/2021  Medication Sig Note   acetaminophen (TYLENOL) 500 MG tablet Take 500 mg by mouth daily as needed for moderate pain.     alendronate (FOSAMAX) 70 MG tablet TAKE 1 TABLET EVERY 7 DAYS WITH A FULL GLASS OF WATER ON AN EMPTY STOMACH DO NOT LIE DOWN FOR AT LEAST 30 MIN    aspirin EC 81 MG tablet Take 81 mg by mouth every other day.    clonazePAM (KLONOPIN) 0.5 MG tablet Take half to one tab po bid for anxiety    esomeprazole (NEXIUM) 20 MG capsule TAKE 1 CAPSULE EVERY DAY    famotidine (PEPCID) 20 MG tablet Take 1 tablet (20 mg total) by mouth daily.    fexofenadine (ALLEGRA) 180 MG tablet Take 180 mg by mouth daily.    gabapentin (NEURONTIN) 100 MG capsule Take 1 capsule (100 mg total) by mouth at bedtime.    gabapentin (NEURONTIN) 100 MG capsule Take by mouth.    latanoprost (XALATAN) 0.005 % ophthalmic solution Place 1 drop into both eyes daily.  06/26/2020: Pt states she uses whenever not always at night   lisinopril (ZESTRIL) 20 MG tablet Take 1 tablet by mouth in the morning and one in the evening    Multiple Vitamin (MULTIVITAMIN WITH MINERALS) TABS tablet Take 1 tablet by mouth daily.  06/26/2020: Pt ran out, needs to get more   [DISCONTINUED] diltiazem (CARDIZEM CD) 120 MG 24 hr capsule Take 1 capsule (120 mg total) by mouth daily.    [DISCONTINUED] letrozole (Lambert) 2.5 MG tablet TAKE  1 TABLET BY MOUTH DAILY    No facility-administered encounter medications on file as of 10/14/2021.    Surgical History: Past Surgical History:  Procedure Laterality Date   ABDOMINAL HYSTERECTOMY     partial   BREAST BIOPSY Left 06/12/2020   [place] Bx, Ribbon Clip, path pending    BREAST LUMPECTOMY Left 07/04/2020   [facility] lumpectomy with radiation   BREAST LUMPECTOMY,RADIO FREQ LOCALIZER,AXILLARY SENTINEL LYMPH NODE BIOPSY Left 07/10/2020   Procedure: BREAST LUMPECTOMY,RADIO FREQ LOCALIZER,AXILLARY SENTINEL LYMPH NODE BIOPSY;  Surgeon: Jules Husbands, MD;  Location: [facility] ORS;  Service: General;  Laterality: Left;   COLONOSCOPY WITH PROPOFOL N/A 10/18/2015   Procedure: COLONOSCOPY WITH PROPOFOL;  Surgeon: Manya Silvas, MD;  Location: [facility] ENDOSCOPY;  Service: Endoscopy;  Laterality: N/A;   COLONOSCOPY WITH PROPOFOL N/A 08/19/2021   Procedure: COLONOSCOPY WITH PROPOFOL;  Surgeon: [name], MD;  Location: [facility] ENDOSCOPY;  Service: Endoscopy;  Laterality: N/A;   INGUINAL HERNIA REPAIR     age 44's   POLYPECTOMY      Medical History: Past Medical History:  Diagnosis Date   Allergy    Arthritis    Breast cancer ([place])    Hyperlipidemia    Hypertension    Seasonal allergies     Family History: Family History  Problem Relation Age of Onset  Cancer Brother    Breast cancer Paternal Aunt     Social History   Socioeconomic History   Marital status: Divorced    Spouse name: Not on file   Number of children: Not on file   Years of education: Not on file   Highest education level: Not on file  Occupational History   Not on file  Tobacco Use   Smoking status: Former    Types: Cigarettes    Quit date: 11/2007    Years since quitting: 13.9   Smokeless tobacco: Never  Vaping Use   Vaping Use: Never used  Substance and Sexual Activity   Alcohol use: Not Currently    Alcohol/week: 0.0 standard drinks   Drug use: No   Sexual activity: Not Currently  Other Topics  Concern   Not on file  Social History Narrative   Not on file   Social Determinants of Health   Financial Resource Strain: Not on file  Food Insecurity: Not on file  Transportation Needs: Not on file  Physical Activity: Not on file  Stress: Not on file  Social Connections: Not on file  Intimate Partner Violence: Not on file      Review of Systems  Constitutional:  Negative for chills, fatigue and unexpected weight change.  HENT:  Negative for congestion, rhinorrhea, sneezing and sore throat.   Eyes:  Negative for redness.  Respiratory:  Negative for cough, chest tightness and shortness of breath.   Cardiovascular:  Negative for chest pain and palpitations.  Gastrointestinal:  Negative for abdominal pain, constipation, diarrhea, nausea and vomiting.  Genitourinary:  Negative for dysuria and frequency.  Musculoskeletal:  Negative for arthralgias, back pain, joint swelling and neck pain.  Skin:  Negative for rash.  Neurological: Negative.  Negative for tremors and numbness.  Hematological:  Negative for adenopathy. Does not bruise/bleed easily.  Psychiatric/Behavioral:  Negative for behavioral problems (Depression), sleep disturbance and suicidal ideas. The patient is nervous/anxious.    Vital Signs: BP 120/80 (BP Location: Right Arm, Patient Position: Sitting, Cuff Size: Small)   Pulse 82   Temp 98.3 F (36.8 C)   Resp 16   Ht 5\' 5"  (1.651 m)   Wt 134 lb (60.8 kg)   SpO2 98%   BMI 22.30 kg/m    Physical Exam Vitals reviewed.  Constitutional:      General: She is not in acute distress.    Appearance: Normal appearance. She is normal weight. She is not ill-appearing.  HENT:     Head: Normocephalic and atraumatic.  Eyes:     Extraocular Movements: Extraocular movements intact.     Pupils: Pupils are equal, round, and reactive to light.  Cardiovascular:     Rate and Rhythm: Normal rate and regular rhythm.  Pulmonary:     Effort: Pulmonary effort is normal. No  respiratory distress.  Neurological:     Mental Status: She is alert and oriented to person, place, and time.     Cranial Nerves: No cranial nerve deficit.     Coordination: Coordination normal.     Gait: Gait normal.  Psychiatric:        Mood and Affect: Mood normal.        Behavior: Behavior normal.       Assessment/Plan: 1. Uncontrolled hypertension Stable with most recent change in blood pressure medication to diltiazem, continue as prescribed.   2. GAD (generalized anxiety disorder) Encouraged patient to use clonazepam when she is having episodes of increased anxiety or  feeling panicky. She can take it twice daily as needed for anxiety.    General Counseling: darlin stenseth understanding of the findings of todays visit and agrees with plan of treatment. I have discussed any further diagnostic evaluation that may be needed or ordered today. We also reviewed her medications today. she has been encouraged to call the office with any questions or concerns that should arise related to todays visit.    No orders of the defined types were placed in this encounter.   No orders of the defined types were placed in this encounter.   Return in about 4 months (around 02/14/2022) for F/U, med refill, Emery Dupuy PCP.   Total time spent:20 Minutes Time spent includes review of chart, medications, test results, and follow up plan with the patient.   [place] Controlled Substance Database was reviewed by me.  This patient was seen by Jonetta Osgood, FNP-C in collaboration with Dr. Clayborn Bigness as a part of collaborative care agreement.   Ashley Bultema R. Valetta Fuller, MSN, FNP-C Internal medicine

## 2021-10-25 ENCOUNTER — Other Ambulatory Visit: Payer: Self-pay | Admitting: Oncology

## 2021-10-25 ENCOUNTER — Other Ambulatory Visit: Payer: Self-pay | Admitting: Internal Medicine

## 2021-10-29 DIAGNOSIS — H43813 Vitreous degeneration, bilateral: Secondary | ICD-10-CM | POA: Diagnosis not present

## 2021-11-04 DIAGNOSIS — M1712 Unilateral primary osteoarthritis, left knee: Secondary | ICD-10-CM | POA: Diagnosis not present

## 2021-11-04 DIAGNOSIS — M5459 Other low back pain: Secondary | ICD-10-CM | POA: Diagnosis not present

## 2021-11-09 DIAGNOSIS — R200 Anesthesia of skin: Secondary | ICD-10-CM | POA: Diagnosis not present

## 2021-11-09 DIAGNOSIS — R202 Paresthesia of skin: Secondary | ICD-10-CM | POA: Diagnosis not present

## 2021-11-09 DIAGNOSIS — F419 Anxiety disorder, unspecified: Secondary | ICD-10-CM | POA: Diagnosis not present

## 2021-11-13 ENCOUNTER — Other Ambulatory Visit: Payer: Self-pay

## 2021-11-13 ENCOUNTER — Ambulatory Visit (INDEPENDENT_AMBULATORY_CARE_PROVIDER_SITE_OTHER): Payer: Medicare Other | Admitting: Nurse Practitioner

## 2021-11-13 ENCOUNTER — Encounter: Payer: Self-pay | Admitting: Nurse Practitioner

## 2021-11-13 ENCOUNTER — Telehealth: Payer: Self-pay

## 2021-11-13 VITALS — BP 166/90 | HR 88 | Temp 98.3°F | Resp 16 | Ht 65.0 in | Wt 136.0 lb

## 2021-11-13 DIAGNOSIS — G5793 Unspecified mononeuropathy of bilateral lower limbs: Secondary | ICD-10-CM | POA: Diagnosis not present

## 2021-11-13 DIAGNOSIS — M79605 Pain in left leg: Secondary | ICD-10-CM | POA: Diagnosis not present

## 2021-11-13 DIAGNOSIS — F411 Generalized anxiety disorder: Secondary | ICD-10-CM | POA: Diagnosis not present

## 2021-11-13 MED ORDER — GABAPENTIN 100 MG PO CAPS: ORAL_CAPSULE | ORAL | 3 refills | Status: DC

## 2021-11-13 MED ORDER — BUSPIRONE HCL 10 MG PO TABS: 10.0000 mg | ORAL_TABLET | Freq: Two times a day (BID) | ORAL | 2 refills | Status: DC

## 2021-11-13 NOTE — Progress Notes (Signed)
[facility] [place], [place] [address]  Internal MEDICINE  Office Visit Note  Patient Name: Virginia Warner  [id number]  [id number]  Date of Service: 11/13/2021  Chief Complaint  Patient presents with   Follow-up    Left leg pain   Hyperlipidemia   Hypertension    HPI Virginia Warner presents for a follow up visit for Left leg pain. She reports that the pain is burning and tingling and she feels like it is a neurological problem. She is also feeling anxious often worrying about potential medical problems she could have. Her leg does not hurt when walking. The pain comes on randomly. Sometimes she feels like it comes on when she is worrying or anxious. She wants to take something for anxiety but does not want to take clonazepam or other habit-forming medications.   Current Medication: Outpatient Encounter Medications as of 11/13/2021  Medication Sig Note   acetaminophen (TYLENOL) 500 MG tablet Take 500 mg by mouth daily as needed for moderate pain.     alendronate (FOSAMAX) 70 MG tablet TAKE 1 TABLET EVERY 7 DAYS WITH A FULL GLASS OF WATER ON AN EMPTY STOMACH DO NOT LIE DOWN FOR AT LEAST 30 MIN    aspirin EC 81 MG tablet Take 81 mg by mouth every other day.    busPIRone (BUSPAR) 10 MG tablet Take 1 tablet (10 mg total) by mouth 2 (two) times daily.    diltiazem (CARDIZEM CD) 120 MG 24 hr capsule TAKE 1 CAPSULE BY MOUTH ONCE DAILY    esomeprazole (NEXIUM) 20 MG capsule TAKE 1 CAPSULE EVERY DAY    famotidine (PEPCID) 20 MG tablet Take 1 tablet (20 mg total) by mouth daily.    fexofenadine (ALLEGRA) 180 MG tablet Take 180 mg by mouth daily.    latanoprost (XALATAN) 0.005 % ophthalmic solution Place 1 drop into both eyes daily.  06/26/2020: Pt states she uses whenever not always at night   letrozole (FEMARA) 2.5 MG tablet TAKE 1 TABLET BY MOUTH DAILY    lisinopril (ZESTRIL) 20 MG tablet Take 1 tablet by mouth in the morning and one in the evening    Multiple Vitamin  (MULTIVITAMIN WITH MINERALS) TABS tablet Take 1 tablet by mouth daily.  06/26/2020: Pt ran out, needs to get more   [DISCONTINUED] clonazePAM (KLONOPIN) 0.5 MG tablet Take half to one tab po bid for anxiety    [DISCONTINUED] gabapentin (NEURONTIN) 100 MG capsule Take 1 capsule (100 mg total) by mouth at bedtime.    [DISCONTINUED] gabapentin (NEURONTIN) 100 MG capsule Take by mouth.    gabapentin (NEURONTIN) 100 MG capsule Take 1 capsule in the morning, 1 capsule in the afternoon and 3 capsules at bedtime.    No facility-administered encounter medications on file as of 11/13/2021.    Surgical History: Past Surgical History:  Procedure Laterality Date   ABDOMINAL HYSTERECTOMY     partial   BREAST BIOPSY Left 06/12/2020   [place] Bx, Ribbon Clip, path pending    BREAST LUMPECTOMY Left 07/04/2020   [facility] lumpectomy with radiation   BREAST LUMPECTOMY,RADIO FREQ LOCALIZER,AXILLARY SENTINEL LYMPH NODE BIOPSY Left 07/10/2020   Procedure: BREAST LUMPECTOMY,RADIO FREQ LOCALIZER,AXILLARY SENTINEL LYMPH NODE BIOPSY;  Surgeon: Jules Husbands, MD;  Location: [facility] ORS;  Service: General;  Laterality: Left;   COLONOSCOPY WITH PROPOFOL N/A 10/18/2015   Procedure: COLONOSCOPY WITH PROPOFOL;  Surgeon: Manya Silvas, MD;  Location: [facility] ENDOSCOPY;  Service: Endoscopy;  Laterality: N/A;   COLONOSCOPY WITH PROPOFOL  N/A 08/19/2021   Procedure: COLONOSCOPY WITH PROPOFOL;  Surgeon: [name], MD;  Location: [facility] ENDOSCOPY;  Service: Endoscopy;  Laterality: N/A;   INGUINAL HERNIA REPAIR     age 89's   POLYPECTOMY      Medical History: Past Medical History:  Diagnosis Date   Allergy    Arthritis    Breast cancer ([place])    Hyperlipidemia    Hypertension    Seasonal allergies     Family History: Family History  Problem Relation Age of Onset   Cancer Brother    Breast cancer Paternal Aunt     Social History   Socioeconomic History   Marital status: Divorced    Spouse name: Not on file    Number of children: Not on file   Years of education: Not on file   Highest education level: Not on file  Occupational History   Not on file  Tobacco Use   Smoking status: Former    Types: Cigarettes    Quit date: 11/2007    Years since quitting: 13.9   Smokeless tobacco: Never  Vaping Use   Vaping Use: Never used  Substance and Sexual Activity   Alcohol use: Not Currently    Alcohol/week: 0.0 standard drinks   Drug use: No   Sexual activity: Not Currently  Other Topics Concern   Not on file  Social History Narrative   Not on file   Social Determinants of Health   Financial Resource Strain: Not on file  Food Insecurity: Not on file  Transportation Needs: Not on file  Physical Activity: Not on file  Stress: Not on file  Social Connections: Not on file  Intimate Partner Violence: Not on file      Review of Systems  Constitutional:  Negative for chills, fatigue and unexpected weight change.  HENT:  Negative for congestion, rhinorrhea, sneezing and sore throat.   Eyes:  Negative for redness.  Respiratory:  Negative for cough, chest tightness and shortness of breath.   Cardiovascular:  Negative for chest pain and palpitations.  Gastrointestinal:  Negative for abdominal pain, constipation, diarrhea, nausea and vomiting.  Genitourinary:  Negative for dysuria and frequency.  Musculoskeletal:  Negative for arthralgias, back pain, joint swelling and neck pain.  Skin:  Negative for rash.  Neurological: Negative.  Negative for tremors and numbness.  Hematological:  Negative for adenopathy. Does not bruise/bleed easily.  Psychiatric/Behavioral:  Negative for behavioral problems (Depression), sleep disturbance and suicidal ideas. The patient is not nervous/anxious.    Vital Signs: BP (!) 166/90 Comment: 178/97  Pulse 88   Temp 98.3 F (36.8 C)   Resp 16   Ht 5\' 5"  (1.651 m)   Wt 136 lb (61.7 kg)   SpO2 99%   BMI 22.63 kg/m    Physical Exam Vitals reviewed.   Constitutional:      General: She is not in acute distress.    Appearance: Normal appearance. She is normal weight. She is not ill-appearing.  HENT:     Head: Normocephalic and atraumatic.  Eyes:     Pupils: Pupils are equal, round, and reactive to light.  Cardiovascular:     Rate and Rhythm: Normal rate and regular rhythm.  Pulmonary:     Effort: Pulmonary effort is normal. No respiratory distress.  Neurological:     Mental Status: She is alert and oriented to person, place, and time.     Cranial Nerves: No cranial nerve deficit.     Coordination: Coordination  normal.     Gait: Gait normal.  Psychiatric:        Mood and Affect: Affect normal. Mood is anxious.        Behavior: Behavior normal. Behavior is cooperative.       Assessment/Plan: 1. Left leg pain ABI was wnl. Patient asking for referral to neurology to discuss nerve pains in legs.  - POCT ABI Screening Pilot No Charge - Ambulatory referral to Neurology  2. Neuropathy involving both lower extremities Gabapentin refilled. Referred to neurology.  - gabapentin (NEURONTIN) 100 MG capsule; Take 1 capsule in the morning, 1 capsule in the afternoon and 3 capsules at bedtime.  Dispense: 150 capsule; Refill: 3 - Ambulatory referral to Neurology  3. GAD (generalized anxiety disorder) Clonazepam discontinued, buspirone prescribed, follow up in 1 month.  - busPIRone (BUSPAR) 10 MG tablet; Take 1 tablet (10 mg total) by mouth 2 (two) times daily.  Dispense: 60 tablet; Refill: 2   General Counseling: Maven verbalizes understanding of the findings of todays visit and agrees with plan of treatment. I have discussed any further diagnostic evaluation that may be needed or ordered today. We also reviewed her medications today. she has been encouraged to call the office with any questions or concerns that should arise related to todays visit.    Orders Placed This Encounter  Procedures   Ambulatory referral to Neurology   POCT  ABI Screening Pilot No Charge    Meds ordered this encounter  Medications   gabapentin (NEURONTIN) 100 MG capsule    Sig: Take 1 capsule in the morning, 1 capsule in the afternoon and 3 capsules at bedtime.    Dispense:  150 capsule    Refill:  3   busPIRone (BUSPAR) 10 MG tablet    Sig: Take 1 tablet (10 mg total) by mouth 2 (two) times daily.    Dispense:  60 tablet    Refill:  2    Return in about 1 month (around 12/13/2021) for F/U, BP check, Desirai Traxler PCP.   Total time spent:30 Minutes Time spent includes review of chart, medications, test results, and follow up plan with the patient.   [place] Controlled Substance Database was reviewed by me.  This patient was seen by Jonetta Osgood, FNP-C in collaboration with Dr. Clayborn Bigness as a part of collaborative care agreement.   Hermenegildo Clausen R. Valetta Fuller, MSN, FNP-C Internal medicine

## 2021-11-13 NOTE — Telephone Encounter (Signed)
Waiting on office notes to send neurology referral-Toni

## 2021-11-14 NOTE — Telephone Encounter (Signed)
Neurology referral sent via [facility] to [facility]-Toni 

## 2021-11-15 NOTE — Telephone Encounter (Signed)
Appointment scheduled for 01/27/22 @ 9:45-Virginia Warner

## 2021-11-18 ENCOUNTER — Telehealth: Payer: Self-pay | Admitting: *Deleted

## 2021-11-18 NOTE — Telephone Encounter (Signed)
Please call her

## 2021-11-18 NOTE — Telephone Encounter (Signed)
Patient would like to speak to Dr. Janese Banks regarding side effects from letrozole. She reports that she is having increased bone pain.

## 2021-11-19 NOTE — Telephone Encounter (Signed)
Called the pt. Yesterday and she feels that she is having nerve pain and shaky at night. She felt that it was caused by the AI she is on for breast cancer. I spoke to Janese Banks and she says it is ok for her to come off the AI for 1 month and she will need to call me back on 12/19 to see if her coming off the pill made it better or not. She says she has made a note to call back to [place] and she already has appt for nerve doctor already

## 2021-11-26 DIAGNOSIS — M5416 Radiculopathy, lumbar region: Secondary | ICD-10-CM | POA: Diagnosis not present

## 2021-11-26 DIAGNOSIS — G8929 Other chronic pain: Secondary | ICD-10-CM | POA: Diagnosis not present

## 2021-11-26 DIAGNOSIS — M5442 Lumbago with sciatica, left side: Secondary | ICD-10-CM | POA: Diagnosis not present

## 2021-11-27 ENCOUNTER — Other Ambulatory Visit: Payer: Self-pay | Admitting: Physician Assistant

## 2021-11-27 DIAGNOSIS — M5442 Lumbago with sciatica, left side: Secondary | ICD-10-CM

## 2021-11-27 DIAGNOSIS — G8929 Other chronic pain: Secondary | ICD-10-CM

## 2021-11-27 DIAGNOSIS — M5416 Radiculopathy, lumbar region: Secondary | ICD-10-CM

## 2021-11-29 DIAGNOSIS — H40003 Preglaucoma, unspecified, bilateral: Secondary | ICD-10-CM | POA: Diagnosis not present

## 2021-12-10 DIAGNOSIS — G8929 Other chronic pain: Secondary | ICD-10-CM | POA: Diagnosis not present

## 2021-12-10 DIAGNOSIS — M5442 Lumbago with sciatica, left side: Secondary | ICD-10-CM | POA: Diagnosis not present

## 2021-12-11 ENCOUNTER — Other Ambulatory Visit: Payer: Self-pay

## 2021-12-11 ENCOUNTER — Ambulatory Visit (INDEPENDENT_AMBULATORY_CARE_PROVIDER_SITE_OTHER): Payer: Medicare Other | Admitting: Nurse Practitioner

## 2021-12-11 ENCOUNTER — Ambulatory Visit
Admission: RE | Admit: 2021-12-11 | Discharge: 2021-12-11 | Disposition: A | Discharge status: Home / Self Care | Payer: Medicare Other | Source: Ambulatory Visit | Attending: Physician Assistant | Admitting: Physician Assistant

## 2021-12-11 ENCOUNTER — Encounter: Payer: Self-pay | Admitting: Nurse Practitioner

## 2021-12-11 VITALS — BP 144/87 | HR 99 | Temp 98.2°F | Resp 16 | Ht 65.0 in | Wt 135.6 lb

## 2021-12-11 DIAGNOSIS — M5416 Radiculopathy, lumbar region: Secondary | ICD-10-CM | POA: Insufficient documentation

## 2021-12-11 DIAGNOSIS — J0110 Acute frontal sinusitis, unspecified: Secondary | ICD-10-CM

## 2021-12-11 DIAGNOSIS — M48061 Spinal stenosis, lumbar region without neurogenic claudication: Secondary | ICD-10-CM | POA: Diagnosis not present

## 2021-12-11 DIAGNOSIS — R7303 Prediabetes: Secondary | ICD-10-CM | POA: Diagnosis not present

## 2021-12-11 DIAGNOSIS — M5442 Lumbago with sciatica, left side: Secondary | ICD-10-CM | POA: Diagnosis not present

## 2021-12-11 DIAGNOSIS — F411 Generalized anxiety disorder: Secondary | ICD-10-CM | POA: Diagnosis not present

## 2021-12-11 DIAGNOSIS — I10 Essential (primary) hypertension: Secondary | ICD-10-CM

## 2021-12-11 DIAGNOSIS — E876 Hypokalemia: Secondary | ICD-10-CM

## 2021-12-11 DIAGNOSIS — M79605 Pain in left leg: Secondary | ICD-10-CM

## 2021-12-11 DIAGNOSIS — G8929 Other chronic pain: Secondary | ICD-10-CM | POA: Diagnosis not present

## 2021-12-11 LAB — POCT GLYCOSYLATED HEMOGLOBIN (HGB A1C): Hemoglobin A1C: 5.8 % — AB (ref 4.0–5.6)

## 2021-12-11 MED ORDER — DOXYCYCLINE HYCLATE 100 MG PO TABS: 100.0000 mg | ORAL_TABLET | Freq: Two times a day (BID) | ORAL | 0 refills | Status: DC

## 2021-12-11 NOTE — Progress Notes (Signed)
[facility] [place], [place] [address]  Internal MEDICINE  Office Visit Note  Patient Name: Virginia Warner  [id number]  [id number]  Date of Service: 12/11/2021  Chief Complaint  Patient presents with   Follow-up   Hyperlipidemia   Hypertension    HPI Persis presents for a follow up visit for anxiety, hypertension and hyperlipidemia. At her previous office visit, she was prescribed buspirone to try for her increased anxiety but she didn't try it. There has been no change in her anxiety level.  Her blood pressure was elevated today but significantly improved when rechecked, see vitals.  Her last A1C in January was 5.8. repeat A1C today, still 5.8, no change still prediabetic.  She is experiencing symptoms of sinusitis including nasal congestion, sinus pressure, and runny nose and post nasal drip.    Current Medication: Outpatient Encounter Medications as of 12/11/2021  Medication Sig Note   acetaminophen (TYLENOL) 500 MG tablet Take 500 mg by mouth daily as needed for moderate pain.     alendronate (FOSAMAX) 70 MG tablet TAKE 1 TABLET EVERY 7 DAYS WITH A FULL GLASS OF WATER ON AN EMPTY STOMACH DO NOT LIE DOWN FOR AT LEAST 30 MIN    aspirin EC 81 MG tablet Take 81 mg by mouth every other day.    busPIRone (BUSPAR) 10 MG tablet Take 1 tablet (10 mg total) by mouth 2 (two) times daily.    diltiazem (CARDIZEM CD) 120 MG 24 hr capsule TAKE 1 CAPSULE BY MOUTH ONCE DAILY    esomeprazole (NEXIUM) 20 MG capsule TAKE 1 CAPSULE EVERY DAY    famotidine (PEPCID) 20 MG tablet Take 1 tablet (20 mg total) by mouth daily.    fexofenadine (ALLEGRA) 180 MG tablet Take 180 mg by mouth daily.    gabapentin (NEURONTIN) 100 MG capsule Take 1 capsule in the morning, 1 capsule in the afternoon and 3 capsules at bedtime.    latanoprost (XALATAN) 0.005 % ophthalmic solution Place 1 drop into both eyes daily.  06/26/2020: Pt states she uses whenever not always at night   letrozole  (FEMARA) 2.5 MG tablet TAKE 1 TABLET BY MOUTH DAILY    lisinopril (ZESTRIL) 20 MG tablet Take 1 tablet by mouth in the morning and one in the evening    Multiple Vitamin (MULTIVITAMIN WITH MINERALS) TABS tablet Take 1 tablet by mouth daily.  06/26/2020: Pt ran out, needs to get more   [DISCONTINUED] doxycycline (VIBRA-TABS) 100 MG tablet Take 1 tablet (100 mg total) by mouth 2 (two) times daily. With food    No facility-administered encounter medications on file as of 12/11/2021.    Surgical History: Past Surgical History:  Procedure Laterality Date   ABDOMINAL HYSTERECTOMY     partial   BREAST BIOPSY Left 06/12/2020   [place] Bx, Ribbon Clip, path pending    BREAST LUMPECTOMY Left 07/04/2020   [facility] lumpectomy with radiation   BREAST LUMPECTOMY,RADIO FREQ LOCALIZER,AXILLARY SENTINEL LYMPH NODE BIOPSY Left 07/10/2020   Procedure: BREAST LUMPECTOMY,RADIO FREQ LOCALIZER,AXILLARY SENTINEL LYMPH NODE BIOPSY;  Surgeon: Jules Husbands, MD;  Location: [facility] ORS;  Service: General;  Laterality: Left;   COLONOSCOPY WITH PROPOFOL N/A 10/18/2015   Procedure: COLONOSCOPY WITH PROPOFOL;  Surgeon: Manya Silvas, MD;  Location: [facility] ENDOSCOPY;  Service: Endoscopy;  Laterality: N/A;   COLONOSCOPY WITH PROPOFOL N/A 08/19/2021   Procedure: COLONOSCOPY WITH PROPOFOL;  Surgeon: [name], MD;  Location: [facility] ENDOSCOPY;  Service: Endoscopy;  Laterality: N/A;  INGUINAL HERNIA REPAIR     age 30's   POLYPECTOMY      Medical History: Past Medical History:  Diagnosis Date   Allergy    Arthritis    Breast cancer ([place])    Hyperlipidemia    Hypertension    Seasonal allergies     Family History: Family History  Problem Relation Age of Onset   Cancer Brother    Breast cancer Paternal Aunt     Social History   Socioeconomic History   Marital status: Divorced    Spouse name: Not on file   Number of children: Not on file   Years of education: Not on file   Highest education level: Not on file   Occupational History   Not on file  Tobacco Use   Smoking status: Former    Types: Cigarettes    Quit date: 11/2007    Years since quitting: 14.1   Smokeless tobacco: Never  Vaping Use   Vaping Use: Never used  Substance and Sexual Activity   Alcohol use: Not Currently    Alcohol/week: 0.0 standard drinks   Drug use: No   Sexual activity: Not Currently  Other Topics Concern   Not on file  Social History Narrative   Not on file   Social Determinants of Health   Financial Resource Strain: Not on file  Food Insecurity: Not on file  Transportation Needs: Not on file  Physical Activity: Not on file  Stress: Not on file  Social Connections: Not on file  Intimate Partner Violence: Not on file      Review of Systems  Constitutional:  Negative for chills, fatigue and fever.  HENT:  Positive for congestion, postnasal drip, rhinorrhea, sinus pressure, sinus pain and sore throat.   Respiratory:  Positive for cough and wheezing. Negative for chest tightness and shortness of breath.   Cardiovascular: Negative.  Negative for chest pain and palpitations.  Gastrointestinal: Negative.  Negative for abdominal pain, constipation, diarrhea, nausea and vomiting.  Musculoskeletal:  Negative for myalgias.  Skin: Negative.  Negative for rash.  Neurological:  Positive for headaches.   Vital Signs: BP (!) 144/87 Comment: 171/91   Pulse 99    Temp 98.2 F (36.8 C)    Resp 16    Ht 5\' 5"  (1.651 m)    Wt 135 lb 9.6 oz (61.5 kg)    SpO2 97%    BMI 22.57 kg/m    Physical Exam Vitals reviewed.  Constitutional:      General: She is not in acute distress.    Appearance: Normal appearance. She is normal weight. She is ill-appearing.  HENT:     Head: Normocephalic and atraumatic.     Right Ear: Tympanic membrane, ear canal and external ear normal.     Left Ear: Tympanic membrane, ear canal and external ear normal.     Nose: Congestion and rhinorrhea present.     Mouth/Throat:     Mouth:  Mucous membranes are moist.     Pharynx: Posterior oropharyngeal erythema present.  Eyes:     Pupils: Pupils are equal, round, and reactive to light.  Cardiovascular:     Rate and Rhythm: Normal rate and regular rhythm.     Heart sounds: Normal heart sounds. No murmur heard. Pulmonary:     Effort: Pulmonary effort is normal. No respiratory distress.     Breath sounds: Normal breath sounds. No wheezing.  Lymphadenopathy:     Cervical: No cervical adenopathy.  Neurological:  Mental Status: She is alert and oriented to person, place, and time.     Cranial Nerves: No cranial nerve deficit.     Gait: Gait normal.  Psychiatric:        Mood and Affect: Mood normal.        Behavior: Behavior normal.       Assessment/Plan: 1. Acute non-recurrent frontal sinusitis Doxycycline prescribed.   2. Prediabetes Stable at 5.8 will recheck in 3 months.  - POCT glycosylated hemoglobin (Hb A1C)  3. Essential hypertension Blood pressure is slightly elevated, should improve if anxiety level is reduced.   4. Hypokalemia Has had low potassium before, repat potassium level.  - Potassium  5. GAD (generalized anxiety disorder) After discussing with the patient, she has decided to try the buspirone that was previously prescribed.    General Counseling: annaly skop understanding of the findings of todays visit and agrees with plan of treatment. I have discussed any further diagnostic evaluation that may be needed or ordered today. We also reviewed her medications today. she has been encouraged to call the office with any questions or concerns that should arise related to todays visit.    Orders Placed This Encounter  Procedures   Potassium   POCT glycosylated hemoglobin (Hb A1C)    Meds ordered this encounter  Medications   DISCONTD: doxycycline (VIBRA-TABS) 100 MG tablet    Sig: Take 1 tablet (100 mg total) by mouth 2 (two) times daily. With food    Dispense:  14 tablet     Refill:  0    Return in about 3 months (around 03/11/2022) for F/U, Recheck A1C, Peder Allums PCP.   Total time spent:30 Minutes Time spent includes review of chart, medications, test results, and follow up plan with the patient.   [place] Controlled Substance Database was reviewed by me.  This patient was seen by Jonetta Osgood, FNP-C in collaboration with Dr. Clayborn Bigness as a part of collaborative care agreement.   Zanetta Dehaan R. Valetta Fuller, MSN, FNP-C Internal medicine

## 2021-12-16 ENCOUNTER — Telehealth: Payer: Self-pay

## 2021-12-16 ENCOUNTER — Telehealth: Payer: Self-pay | Admitting: *Deleted

## 2021-12-16 ENCOUNTER — Other Ambulatory Visit: Payer: Self-pay

## 2021-12-16 MED ORDER — AZITHROMYCIN 250 MG PO TABS: ORAL_TABLET | ORAL | 0 refills | Status: AC

## 2021-12-16 NOTE — Telephone Encounter (Signed)
Call returned to patient and she is going to restart medicine. She has been off ofit since 11/19/21

## 2021-12-16 NOTE — Telephone Encounter (Signed)
Patient called requesting zpack. Sent msg to [employer] and nurses-Toni

## 2021-12-16 NOTE — Telephone Encounter (Signed)
Patient called reporting that she has been holding her Letrozole due to having body aches, but after 2 weeks she is still hurting and she states she can start back on Letrozole. She asks if this is ok. Please advise

## 2021-12-25 DIAGNOSIS — M48062 Spinal stenosis, lumbar region with neurogenic claudication: Secondary | ICD-10-CM | POA: Diagnosis not present

## 2021-12-25 DIAGNOSIS — G8929 Other chronic pain: Secondary | ICD-10-CM | POA: Diagnosis not present

## 2021-12-25 DIAGNOSIS — M5442 Lumbago with sciatica, left side: Secondary | ICD-10-CM | POA: Diagnosis not present

## 2022-01-08 ENCOUNTER — Other Ambulatory Visit: Payer: Self-pay

## 2022-01-08 ENCOUNTER — Telehealth (INDEPENDENT_AMBULATORY_CARE_PROVIDER_SITE_OTHER): Payer: Medicare Other | Admitting: Nurse Practitioner

## 2022-01-08 ENCOUNTER — Encounter: Payer: Self-pay | Admitting: Nurse Practitioner

## 2022-01-08 VITALS — Ht 65.0 in | Wt 135.0 lb

## 2022-01-08 DIAGNOSIS — J0110 Acute frontal sinusitis, unspecified: Secondary | ICD-10-CM

## 2022-01-08 DIAGNOSIS — R051 Acute cough: Secondary | ICD-10-CM | POA: Diagnosis not present

## 2022-01-08 MED ORDER — AMOXICILLIN-POT CLAVULANATE 875-125 MG PO TABS: 1.0000 | ORAL_TABLET | Freq: Two times a day (BID) | ORAL | 0 refills | Status: DC

## 2022-01-08 NOTE — Progress Notes (Signed)
[facility] [place], [place] [address]  Internal MEDICINE  Telephone Visit  Patient Name: Virginia Warner  [id number]  [id number]  Date of Service: 01/08/2022  I connected with the patient at 3:15 PM by telephone and verified the patients identity using two identifiers.   I discussed the limitations, risks, security and privacy concerns of performing an evaluation and management service by telephone and the availability of in person appointments. I also discussed with the patient that there may be a patient responsible charge related to the service.  The patient expressed understanding and agrees to proceed.    Chief Complaint  Patient presents with   Telephone Assessment    [phone]   Telephone Screen    Covid test rapid negative and still waiting for PCR    Sinusitis   Cough    HPI Virginia Warner presents for a telehealth virtual visit for symptoms of sinusitis. Her home covid test was negative but she is still waiting on PCR test and will call back with those results. She reports have a cough, nasal congestion, sinus pressure, headache, runny nose.    Current Medication: Outpatient Encounter Medications as of 01/08/2022  Medication Sig Note   acetaminophen (TYLENOL) 500 MG tablet Take 500 mg by mouth daily as needed for moderate pain.     alendronate (FOSAMAX) 70 MG tablet TAKE 1 TABLET EVERY 7 DAYS WITH A FULL GLASS OF WATER ON AN EMPTY STOMACH DO NOT LIE DOWN FOR AT LEAST 30 MIN    amoxicillin-clavulanate (AUGMENTIN) 875-125 MG tablet Take 1 tablet by mouth 2 (two) times daily.    aspirin EC 81 MG tablet Take 81 mg by mouth every other day.    busPIRone (BUSPAR) 10 MG tablet Take 1 tablet (10 mg total) by mouth 2 (two) times daily.    diltiazem (CARDIZEM CD) 120 MG 24 hr capsule TAKE 1 CAPSULE BY MOUTH ONCE DAILY    esomeprazole (NEXIUM) 20 MG capsule TAKE 1 CAPSULE EVERY DAY    famotidine (PEPCID) 20 MG tablet Take 1 tablet (20 mg total) by mouth daily.     fexofenadine (ALLEGRA) 180 MG tablet Take 180 mg by mouth daily.    gabapentin (NEURONTIN) 100 MG capsule Take 1 capsule in the morning, 1 capsule in the afternoon and 3 capsules at bedtime.    latanoprost (XALATAN) 0.005 % ophthalmic solution Place 1 drop into both eyes daily.  06/26/2020: Pt states she uses whenever not always at night   letrozole (FEMARA) 2.5 MG tablet TAKE 1 TABLET BY MOUTH DAILY    lisinopril (ZESTRIL) 20 MG tablet Take 1 tablet by mouth in the morning and one in the evening    Multiple Vitamin (MULTIVITAMIN WITH MINERALS) TABS tablet Take 1 tablet by mouth daily.  06/26/2020: Pt ran out, needs to get more   [DISCONTINUED] doxycycline (VIBRA-TABS) 100 MG tablet Take 1 tablet (100 mg total) by mouth 2 (two) times daily. With food    No facility-administered encounter medications on file as of 01/08/2022.    Surgical History: Past Surgical History:  Procedure Laterality Date   ABDOMINAL HYSTERECTOMY     partial   BREAST BIOPSY Left 06/12/2020   [place] Bx, Ribbon Clip, path pending    BREAST LUMPECTOMY Left 07/04/2020   [facility] lumpectomy with radiation   BREAST LUMPECTOMY,RADIO FREQ LOCALIZER,AXILLARY SENTINEL LYMPH NODE BIOPSY Left 07/10/2020   Procedure: BREAST LUMPECTOMY,RADIO FREQ LOCALIZER,AXILLARY SENTINEL LYMPH NODE BIOPSY;  Surgeon: Jules Husbands, MD;  Location: [facility] ORS;  Service: General;  Laterality: Left;   COLONOSCOPY WITH PROPOFOL N/A 10/18/2015   Procedure: COLONOSCOPY WITH PROPOFOL;  Surgeon: Manya Silvas, MD;  Location: [facility] ENDOSCOPY;  Service: Endoscopy;  Laterality: N/A;   COLONOSCOPY WITH PROPOFOL N/A 08/19/2021   Procedure: COLONOSCOPY WITH PROPOFOL;  Surgeon: [name], MD;  Location: [facility] ENDOSCOPY;  Service: Endoscopy;  Laterality: N/A;   INGUINAL HERNIA REPAIR     age 49's   POLYPECTOMY      Medical History: Past Medical History:  Diagnosis Date   Allergy    Arthritis    Breast cancer ([place])    Hyperlipidemia    Hypertension     Seasonal allergies     Family History: Family History  Problem Relation Age of Onset   Cancer Brother    Breast cancer Paternal Aunt     Social History   Socioeconomic History   Marital status: Divorced    Spouse name: Not on file   Number of children: Not on file   Years of education: Not on file   Highest education level: Not on file  Occupational History   Not on file  Tobacco Use   Smoking status: Former    Types: Cigarettes    Quit date: 11/2007    Years since quitting: 14.1   Smokeless tobacco: Never  Vaping Use   Vaping Use: Never used  Substance and Sexual Activity   Alcohol use: Not Currently    Alcohol/week: 0.0 standard drinks   Drug use: No   Sexual activity: Not Currently  Other Topics Concern   Not on file  Social History Narrative   Not on file   Social Determinants of Health   Financial Resource Strain: Not on file  Food Insecurity: Not on file  Transportation Needs: Not on file  Physical Activity: Not on file  Stress: Not on file  Social Connections: Not on file  Intimate Partner Violence: Not on file      Review of Systems  Constitutional:  Negative for appetite change, chills, fatigue and fever.  HENT:  Positive for congestion, postnasal drip, rhinorrhea, sinus pressure, sinus pain and sore throat. Negative for ear pain.   Respiratory:  Positive for cough. Negative for chest tightness, shortness of breath and wheezing.   Cardiovascular: Negative.  Negative for chest pain and palpitations.  Gastrointestinal:  Negative for abdominal pain, constipation, diarrhea, nausea and vomiting.  Musculoskeletal: Negative.  Negative for myalgias.  Skin: Negative.  Negative for rash.  Neurological: Negative.  Negative for headaches.   Vital Signs: Ht 5\' 5"  (1.651 m)    Wt 135 lb (61.2 kg)    BMI 22.47 kg/m    Observation/Objective: Virginia Warner is alert and oriented and engages in conversation appropriately. She does not sound as though she is in any  acute distress over telephone call.     Assessment/Plan: 1. Acute non-recurrent frontal sinusitis Empiric antibiotic treatment prescribed.  - amoxicillin-clavulanate (AUGMENTIN) 875-125 MG tablet; Take 1 tablet by mouth 2 (two) times daily.  Dispense: 20 tablet; Refill: 0  2. Acute cough May continue to use coricidin OTC for cough   General Counseling: zoanne newill understanding of the findings of today's phone visit and agrees with plan of treatment. I have discussed any further diagnostic evaluation that may be needed or ordered today. We also reviewed her medications today. she has been encouraged to call the office with any questions or concerns that should arise related to todays visit.  Return if symptoms worsen or  fail to improve.   No orders of the defined types were placed in this encounter.   Meds ordered this encounter  Medications   amoxicillin-clavulanate (AUGMENTIN) 875-125 MG tablet    Sig: Take 1 tablet by mouth 2 (two) times daily.    Dispense:  20 tablet    Refill:  0    Time spent:10 Minutes Time spent with patient included reviewing progress notes, labs, imaging studies, and discussing plan for follow up.  [place] Controlled Substance Database was reviewed by me for overdose risk score (ORS) if appropriate.  This patient was seen by Jonetta Osgood, FNP-C in collaboration with Dr. Clayborn Bigness as a part of collaborative care agreement.  Terrian Sentell R. Valetta Fuller, MSN, FNP-C Internal medicine

## 2022-01-14 ENCOUNTER — Emergency Department: Payer: Medicare Other

## 2022-01-14 ENCOUNTER — Emergency Department
Admission: EM | Admit: 2022-01-14 | Discharge: 2022-01-14 | Disposition: A | Discharge status: Home / Self Care | Payer: Medicare Other | Attending: Emergency Medicine | Admitting: Emergency Medicine

## 2022-01-14 ENCOUNTER — Other Ambulatory Visit: Payer: Self-pay

## 2022-01-14 DIAGNOSIS — R059 Cough, unspecified: Secondary | ICD-10-CM | POA: Diagnosis present

## 2022-01-14 DIAGNOSIS — Z20822 Contact with and (suspected) exposure to covid-19: Secondary | ICD-10-CM | POA: Diagnosis not present

## 2022-01-14 DIAGNOSIS — J069 Acute upper respiratory infection, unspecified: Secondary | ICD-10-CM | POA: Insufficient documentation

## 2022-01-14 LAB — RESP PANEL BY RT-PCR (FLU A&B, COVID) ARPGX2
Influenza A by PCR: NEGATIVE
Influenza B by PCR: NEGATIVE
SARS Coronavirus 2 by RT PCR: NEGATIVE

## 2022-01-14 NOTE — Discharge Instructions (Signed)
Finish your antibiotic Use mucinex to thin the mucus Return if worsening

## 2022-01-14 NOTE — ED Triage Notes (Signed)
Pt to ED via POV with c/o cough and congestion, for a week. She is had green sputum earlier but now she feels like she can not get anything up. She is able to speak for complete sentences. Has been wheezing also

## 2022-01-14 NOTE — ED Provider Notes (Signed)
[facility] Provider Note    Event Date/Time   First MD Initiated Contact with Patient 01/14/22 1237     (approximate)   History   Cough   HPI  Virginia Warner is a 76 y.o. female presents emergency department complaining of cough and congestion for a week.  Patient took a home COVID test which was negative.  Called her physician who called in amoxicillin.  Patient states she thought she had been wheezing so she wanted to be seen in the ED.  She denies chest pain/shortness of breath.  Would like to know of a medication to help thin the mucus so she can get about.  No fever or chills.      Physical Exam   Triage Vital Signs: ED Triage Vitals  Enc Vitals Group     BP 01/14/22 1233 (!) 174/75     Pulse Rate 01/14/22 1233 (!) 107     Resp 01/14/22 1233 18     Temp 01/14/22 1233 97.9 F (36.6 C)     Temp Source 01/14/22 1233 Oral     SpO2 01/14/22 1233 94 %     Weight 01/14/22 1234 133 lb (60.3 kg)     Height 01/14/22 1234 5\' 5"  (1.651 m)     Head Circumference --      Peak Flow --      Pain Score 01/14/22 1234 0     Pain Loc --      Pain Edu? --      Excl. in [place]? --     Most recent vital signs: Vitals:   01/14/22 1233  BP: (!) 174/75  Pulse: (!) 107  Resp: 18  Temp: 97.9 F (36.6 C)  SpO2: 94%     General: Awake, no distress.   CV:  Good peripheral perfusion. regular rate and  rhythm Resp:  Normal effort. Lungs CTA Abd:  No distention.   Other:      ED Results / Procedures / Treatments   Labs (all labs ordered are listed, but only abnormal results are displayed) Labs Reviewed  RESP PANEL BY RT-PCR (FLU A&B, COVID) [id number]     EKG     RADIOLOGY Chest x-ray    PROCEDURES:   Procedures   MEDICATIONS ORDERED IN ED: Medications - No data to display   IMPRESSION / MDM / [place] / ED COURSE  I reviewed the triage vital signs and the nursing notes.                              Differential diagnosis  includes, but is not limited to, COVID, influenza, CAP, bronchitis  The patient is a 76 year old female presents emergency department with URI symptoms.  See HPI.  Physical exam shows patient appears stable  Chest x-ray was reviewed by me.  I do not see any acute abnormality.  This was confirmed by radiology  Patient's COVID test/flu test is still pending.  I did explain the x-ray results to the patient.  Her lungs are clear and she has no wheezing so I feel she is stable to leave if she would like.  She wants me to call her back with test results instead of waiting for her COVID test results.  She is to follow-up with her regular doctor if not improving in 3 days.  Finish her antibiotic.  Return emergency department worsening.  Discharged in stable condition.  I  did review her COVID test.  COVID test and flu test are both negative.  I did call her to notify.  She is to follow-up with her doctor if not improving in 3 days.   FINAL CLINICAL IMPRESSION(S) / ED DIAGNOSES   Final diagnoses:  Acute URI     Rx / DC Orders   ED Discharge Orders     None        Note:  This document was prepared using Dragon voice recognition software and may include unintentional dictation errors.    Versie Starks, PA-C 01/14/22 1416    Harvest Dark, MD 01/14/22 1431

## 2022-01-14 NOTE — ED Notes (Signed)
Pt walking to xray with xray tech. NAD noted.

## 2022-01-17 ENCOUNTER — Other Ambulatory Visit: Payer: Self-pay

## 2022-01-17 ENCOUNTER — Ambulatory Visit (INDEPENDENT_AMBULATORY_CARE_PROVIDER_SITE_OTHER): Payer: Medicare Other | Admitting: Nurse Practitioner

## 2022-01-17 ENCOUNTER — Encounter: Payer: Self-pay | Admitting: Physician Assistant

## 2022-01-17 VITALS — BP 160/90 | HR 100 | Temp 98.9°F | Resp 16 | Ht 65.0 in | Wt 131.4 lb

## 2022-01-17 DIAGNOSIS — F411 Generalized anxiety disorder: Secondary | ICD-10-CM

## 2022-01-17 DIAGNOSIS — J0110 Acute frontal sinusitis, unspecified: Secondary | ICD-10-CM

## 2022-01-17 DIAGNOSIS — R0981 Nasal congestion: Secondary | ICD-10-CM

## 2022-01-17 DIAGNOSIS — I10 Essential (primary) hypertension: Secondary | ICD-10-CM | POA: Diagnosis not present

## 2022-01-17 MED ORDER — FLUTICASONE PROPIONATE 50 MCG/ACT NA SUSP: 2.0000 | Freq: Every day | NASAL | 2 refills | Status: DC

## 2022-01-17 NOTE — Progress Notes (Signed)
[facility] [place], [place] [address]  Internal MEDICINE  Office Visit Note  Patient Name: Virginia Warner  [id number]  [id number]  Date of Service: 01/17/2022  Chief Complaint  Patient presents with   Nasal Congestion    Still having symptoms - went to the ED, was prescribed Amoxicillin and Mucinex, tested negative for covid/flu - feels like she can not swallow/out of breath at times     HPI Virginia Warner presents for an acute sick visit for nasal congestions and sinusitis. She was seen by virtual visit on 01/08/2022 and was prescribed antibiotics for a sinus infection. She went to the ED on 01/14/2022 due to still having symptoms of nasal congestion, sinus drainage and sinus pressure and was prescribed amoxicillin and mucinex. She was tested for covid and flu but both were negative. She reports feeling like it is difficulty to swallow at times and like she is out of breath sometimes.  She has been having increased ingestion as well. - taking esomeprazole.     Current Medication:  Outpatient Encounter Medications as of 01/17/2022  Medication Sig Note   acetaminophen (TYLENOL) 500 MG tablet Take 500 mg by mouth daily as needed for moderate pain.     alendronate (FOSAMAX) 70 MG tablet TAKE 1 TABLET EVERY 7 DAYS WITH A FULL GLASS OF WATER ON AN EMPTY STOMACH DO NOT LIE DOWN FOR AT LEAST 30 MIN    amoxicillin-clavulanate (AUGMENTIN) 875-125 MG tablet Take 1 tablet by mouth 2 (two) times daily.    aspirin EC 81 MG tablet Take 81 mg by mouth every other day.    busPIRone (BUSPAR) 10 MG tablet Take 1 tablet (10 mg total) by mouth 2 (two) times daily.    diltiazem (CARDIZEM CD) 120 MG 24 hr capsule TAKE 1 CAPSULE BY MOUTH ONCE DAILY    esomeprazole (NEXIUM) 20 MG capsule TAKE 1 CAPSULE EVERY DAY    famotidine (PEPCID) 20 MG tablet Take 1 tablet (20 mg total) by mouth daily.    fexofenadine (ALLEGRA) 180 MG tablet Take 180 mg by mouth daily.    fluticasone (FLONASE) 50  MCG/ACT nasal spray Place 2 sprays into both nostrils daily.    gabapentin (NEURONTIN) 100 MG capsule Take 1 capsule in the morning, 1 capsule in the afternoon and 3 capsules at bedtime.    latanoprost (XALATAN) 0.005 % ophthalmic solution Place 1 drop into both eyes daily.  06/26/2020: Pt states she uses whenever not always at night   letrozole (FEMARA) 2.5 MG tablet TAKE 1 TABLET BY MOUTH DAILY    lisinopril (ZESTRIL) 20 MG tablet Take 1 tablet by mouth in the morning and one in the evening    Multiple Vitamin (MULTIVITAMIN WITH MINERALS) TABS tablet Take 1 tablet by mouth daily.  06/26/2020: Pt ran out, needs to get more   No facility-administered encounter medications on file as of 01/17/2022.      Medical History: Past Medical History:  Diagnosis Date   Allergy    Arthritis    Breast cancer ([place])    Hyperlipidemia    Hypertension    Seasonal allergies      Vital Signs: BP (!) 160/90    Pulse 100    Temp 98.9 F (37.2 C)    Resp 16    Ht 5\' 5"  (1.651 m)    Wt 131 lb 6.4 oz (59.6 kg)    SpO2 98%    BMI 21.87 kg/m    Review of Systems  Constitutional:  Negative for appetite change, chills, fatigue and fever.  HENT:  Positive for congestion, postnasal drip, rhinorrhea, sinus pressure, sinus pain and sore throat. Negative for ear pain.   Respiratory:  Positive for cough. Negative for chest tightness, shortness of breath and wheezing.   Cardiovascular: Negative.  Negative for chest pain and palpitations.  Gastrointestinal:  Negative for abdominal pain, constipation, diarrhea, nausea and vomiting.  Musculoskeletal: Negative.  Negative for myalgias.  Skin: Negative.  Negative for rash.  Neurological: Negative.  Negative for headaches.   Physical Exam Vitals reviewed.  Constitutional:      Appearance: Normal appearance. She is normal weight.  HENT:     Head: Normocephalic and atraumatic.     Right Ear: Tympanic membrane, ear canal and external ear normal.     Left Ear:  Tympanic membrane, ear canal and external ear normal.     Nose: Congestion and rhinorrhea present.     Mouth/Throat:     Mouth: Mucous membranes are moist.     Pharynx: Oropharynx is clear. Posterior oropharyngeal erythema present. No oropharyngeal exudate.  Eyes:     Pupils: Pupils are equal, round, and reactive to light.  Cardiovascular:     Rate and Rhythm: Normal rate and regular rhythm.     Heart sounds: Normal heart sounds. No murmur heard. Pulmonary:     Effort: Pulmonary effort is normal. No respiratory distress.     Breath sounds: Normal breath sounds. No wheezing.  Neurological:     Mental Status: She is alert and oriented to person, place, and time.  Psychiatric:        Mood and Affect: Mood normal.        Behavior: Behavior normal.      Assessment/Plan: 1. Acute non-recurrent frontal sinusitis Having residual symptoms of sinus infection, will consider PFT if SOB remains persistent. For now, sample of albuterol inhaler provided to patient for her to use when feeling SOB or wheezing.  - fluticasone (FLONASE) 50 MCG/ACT nasal spray; Place 2 sprays into both nostrils daily.  Dispense: 16 g; Refill: 2  2. Nasal congestion Flonase prescribed to help decrease nasal congestion. - fluticasone (FLONASE) 50 MCG/ACT nasal spray; Place 2 sprays into both nostrils daily.  Dispense: 16 g; Refill: 2  3. Elevated blood pressure reading in office with white coat syndrome, with diagnosis of hypertension Blood pressure is usually significantly elevated in the office. Reports blood pressures at home are significantly lower. Takes her blood pressure medications as prescribed.   4. GAD (generalized anxiety disorder) She is often at a high anxiety level. Patient lives alone and is often worried about her health. She is supposed to be taking buspirone but stopped taking it while she is on the antibiotic per ED doctor's instructions. Encouraged patient to send a mychart message or call the  clinic when she has concerns and we can always schedule an appointment to discuss her health concerns further even though they are usually benign.    General Counseling: Virginia Warner understanding of the findings of todays visit and agrees with plan of treatment. I have discussed any further diagnostic evaluation that may be needed or ordered today. We also reviewed her medications today. she has been encouraged to call the office with any questions or concerns that should arise related to todays visit.    Counseling:    No orders of the defined types were placed in this encounter.   Meds ordered this encounter  Medications   fluticasone (FLONASE) 50  MCG/ACT nasal spray    Sig: Place 2 sprays into both nostrils daily.    Dispense:  16 g    Refill:  2    Return if symptoms worsen or fail to improve.  [place] Controlled Substance Database was reviewed by me for overdose risk score (ORS)  Time spent:30 Minutes Time spent with patient included reviewing progress notes, labs, imaging studies, and discussing plan for follow up.   This patient was seen by Jonetta Osgood, FNP-C in collaboration with Dr. Clayborn Bigness as a part of collaborative care agreement.  Virginia Arlen R. Valetta Fuller, MSN, FNP-C Internal Medicine

## 2022-01-20 ENCOUNTER — Other Ambulatory Visit: Payer: Self-pay | Admitting: Internal Medicine

## 2022-02-06 ENCOUNTER — Other Ambulatory Visit: Payer: Self-pay | Admitting: Nurse Practitioner

## 2022-02-06 DIAGNOSIS — M810 Age-related osteoporosis without current pathological fracture: Secondary | ICD-10-CM

## 2022-02-13 ENCOUNTER — Other Ambulatory Visit: Payer: Self-pay

## 2022-02-13 ENCOUNTER — Ambulatory Visit: Payer: Medicare Other | Admitting: Nurse Practitioner

## 2022-02-13 ENCOUNTER — Ambulatory Visit (INDEPENDENT_AMBULATORY_CARE_PROVIDER_SITE_OTHER): Payer: Medicare Other | Admitting: Nurse Practitioner

## 2022-02-13 ENCOUNTER — Encounter: Payer: Self-pay | Admitting: Nurse Practitioner

## 2022-02-13 VITALS — BP 150/84 | HR 98 | Temp 98.8°F | Resp 16 | Ht 64.0 in | Wt 131.4 lb

## 2022-02-13 DIAGNOSIS — R0602 Shortness of breath: Secondary | ICD-10-CM | POA: Diagnosis not present

## 2022-02-13 DIAGNOSIS — J4541 Moderate persistent asthma with (acute) exacerbation: Secondary | ICD-10-CM

## 2022-02-13 MED ORDER — FLUTICASONE-SALMETEROL 115-21 MCG/ACT IN AERO: 2.0000 | INHALATION_SPRAY | Freq: Two times a day (BID) | RESPIRATORY_TRACT | 12 refills | Status: DC

## 2022-02-13 MED ORDER — MONTELUKAST SODIUM 10 MG PO TABS: 10.0000 mg | ORAL_TABLET | Freq: Every day | ORAL | 3 refills | Status: DC

## 2022-02-13 NOTE — Progress Notes (Signed)
[facility] [place], [place] [address]  Internal MEDICINE  Office Visit Note  Patient Name: Virginia Warner  [id number]  [id number]  Date of Service: 02/23/2022  Chief Complaint  Patient presents with   Acute Visit   Shortness of Breath   Cough    Wheezing, has been going on for a month     HPI Virginia Warner presents for an acute sick visit for wheezing that has been going on for at least a month.  Patient was treated for sinus infection at the last 3 office visits that she has had.  She continues to have wheezing.  She has never had a pulmonary function test.  Spirometry was done today in office and did show moderate obstructive lung disease most consistent with asthma. She also has significant anxiety and worry and is constantly on the move.  She was previously prescribed buspirone to help decrease her anxiety level but she has not yet tried it.  Her blood pressure is not well controlled at this time but her blood pressure is also elevated when she has increased anxiety.    Current Medication:  Outpatient Encounter Medications as of 02/13/2022  Medication Sig Note   acetaminophen (TYLENOL) 500 MG tablet Take 500 mg by mouth daily as needed for moderate pain.     alendronate (FOSAMAX) 70 MG tablet TAKE 1 TABLET EVERY 7 DAYS WITH A FULL GLASS OF WATER ON AN EMPTY STOMACH DO NOT LIE DOWN FOR AT LEAST 30 MIN    amoxicillin-clavulanate (AUGMENTIN) 875-125 MG tablet Take 1 tablet by mouth 2 (two) times daily.    aspirin EC 81 MG tablet Take 81 mg by mouth every other day.    busPIRone (BUSPAR) 10 MG tablet Take 1 tablet (10 mg total) by mouth 2 (two) times daily.    diltiazem (CARDIZEM CD) 120 MG 24 hr capsule TAKE 1 CAPSULE BY MOUTH ONCE DAILY    esomeprazole (NEXIUM) 20 MG capsule TAKE 1 CAPSULE EVERY DAY    famotidine (PEPCID) 20 MG tablet Take 1 tablet (20 mg total) by mouth daily.    fluticasone (FLONASE) 50 MCG/ACT nasal spray Place 2 sprays into both nostrils  daily.    gabapentin (NEURONTIN) 100 MG capsule Take 1 capsule in the morning, 1 capsule in the afternoon and 3 capsules at bedtime.    latanoprost (XALATAN) 0.005 % ophthalmic solution Place 1 drop into both eyes daily.  06/26/2020: Pt states she uses whenever not always at night   lisinopril (ZESTRIL) 20 MG tablet Take 1 tablet by mouth in the morning and one in the evening    Multiple Vitamin (MULTIVITAMIN WITH MINERALS) TABS tablet Take 1 tablet by mouth daily.  06/26/2020: Pt ran out, needs to get more   [DISCONTINUED] fexofenadine (ALLEGRA) 180 MG tablet Take 180 mg by mouth daily.    [DISCONTINUED] fluticasone-salmeterol (ADVAIR HFA) 115-21 MCG/ACT inhaler Inhale 2 puffs into the lungs 2 (two) times daily. 02/17/2022: rx changed to [place]   [DISCONTINUED] letrozole (FEMARA) 2.5 MG tablet TAKE 1 TABLET BY MOUTH DAILY    [DISCONTINUED] montelukast (SINGULAIR) 10 MG tablet Take 1 tablet (10 mg total) by mouth at bedtime. (Patient not taking: Reported on 02/17/2022)    No facility-administered encounter medications on file as of 02/13/2022.      Medical History: Past Medical History:  Diagnosis Date   Allergy    Arthritis    Breast cancer ([place])    Hyperlipidemia    Hypertension  Seasonal allergies      Vital Signs: BP (!) 150/84 Comment: 160/80   Pulse 98 Comment: 105   Temp 98.8 F (37.1 C)    Resp 16    Ht 5\' 4"  (1.626 m)    Wt 131 lb 6.4 oz (59.6 kg)    SpO2 97%    BMI 22.55 kg/m    Review of Systems  Constitutional:  Negative for chills, fatigue and unexpected weight change.  HENT:  Positive for congestion. Negative for postnasal drip, rhinorrhea, sinus pressure, sinus pain, sneezing, sore throat and trouble swallowing.   Eyes:  Negative for redness.  Respiratory:  Positive for chest tightness, shortness of breath and wheezing. Negative for cough.   Cardiovascular: Negative.  Negative for chest pain and palpitations.  Gastrointestinal: Negative.  Negative for  abdominal pain, constipation, diarrhea, nausea and vomiting.  Genitourinary:  Negative for dysuria and frequency.  Musculoskeletal: Negative.  Negative for arthralgias, back pain, joint swelling and neck pain.  Skin:  Negative for rash.  Neurological: Negative.  Negative for tremors and numbness.  Hematological:  Negative for adenopathy. Does not bruise/bleed easily.  Psychiatric/Behavioral:  Negative for behavioral problems (Depression), sleep disturbance and suicidal ideas. The patient is not nervous/anxious.    Physical Exam Vitals reviewed.  Constitutional:      General: She is not in acute distress.    Appearance: Normal appearance. She is normal weight. She is not ill-appearing.  HENT:     Head: Normocephalic and atraumatic.  Eyes:     Pupils: Pupils are equal, round, and reactive to light.  Cardiovascular:     Rate and Rhythm: Normal rate and regular rhythm.  Pulmonary:     Effort: Pulmonary effort is normal. No respiratory distress.     Breath sounds: Wheezing present.  Neurological:     Mental Status: She is alert and oriented to person, place, and time.  Psychiatric:        Mood and Affect: Mood normal.        Behavior: Behavior normal.      Assessment/Plan: 1. Moderate persistent asthma with acute exacerbation Patient provided with a sample of advair. Advair and montelukast prescription sent to pharmacy. Need PFT  2. SOB (shortness of breath) In office spirometry done showing decreased FEV1 with normal FVC. Patient has moderate obstructive lung disease, needs a PFT.  - Spirometry with Graph   General Counseling: Virginia Warner understanding of the findings of todays visit and agrees with plan of treatment. I have discussed any further diagnostic evaluation that may be needed or ordered today. We also reviewed her medications today. she has been encouraged to call the office with any questions or concerns that should arise related to todays  visit.    Counseling:    Orders Placed This Encounter  Procedures   Spirometry with Graph    Meds ordered this encounter  Medications   DISCONTD: fluticasone-salmeterol (ADVAIR HFA) 115-21 MCG/ACT inhaler    Sig: Inhale 2 puffs into the lungs 2 (two) times daily.    Dispense:  1 each    Refill:  12   DISCONTD: montelukast (SINGULAIR) 10 MG tablet    Sig: Take 1 tablet (10 mg total) by mouth at bedtime.    Dispense:  30 tablet    Refill:  3    Return in about 1 month (around 03/13/2022) for F/U, eval new med, [facility] PCP , previously scheduled.  [place] Controlled Substance Database was reviewed by me for overdose risk score (ORS)  Time spent:20 Minutes Time spent with patient included reviewing progress notes, labs, imaging studies, and discussing plan for follow up.   This patient was seen by Jonetta Osgood, FNP-C in collaboration with Dr. Clayborn Bigness as a part of collaborative care agreement.  Caliope Ruppert R. Valetta Fuller, MSN, FNP-C Internal Medicine

## 2022-02-15 ENCOUNTER — Other Ambulatory Visit: Payer: Self-pay | Admitting: Oncology

## 2022-02-17 ENCOUNTER — Other Ambulatory Visit: Payer: Self-pay

## 2022-02-17 ENCOUNTER — Telehealth: Payer: Self-pay

## 2022-02-17 ENCOUNTER — Other Ambulatory Visit: Payer: Self-pay | Admitting: Oncology

## 2022-02-17 MED ORDER — FLUTICASONE-SALMETEROL 113-14 MCG/ACT IN AEPB: INHALATION_SPRAY | RESPIRATORY_TRACT | 3 refills | Status: DC

## 2022-02-19 ENCOUNTER — Telehealth: Payer: Self-pay

## 2022-02-19 NOTE — Telephone Encounter (Signed)
Spoke to pt and informed her that her insuranc didn't cover the advair inhaler so pharmacy gave an alternative (FLUTICASONE-SALMETEROL (AIRDUO RESPICLIK) 113/14.

## 2022-02-21 ENCOUNTER — Ambulatory Visit: Payer: Medicare Other | Admitting: Radiation Oncology

## 2022-02-23 ENCOUNTER — Encounter: Payer: Self-pay | Admitting: Nurse Practitioner

## 2022-02-24 NOTE — Telephone Encounter (Signed)
Send message 

## 2022-02-27 ENCOUNTER — Telehealth: Payer: Self-pay

## 2022-02-27 NOTE — Telephone Encounter (Signed)
Pt called and advised that she has been having a small nose bleed here and there and states she thinks the inhaler is drying her out.  She spoke to pharmacy and they told her to only use the inhaler once until she comes in to see her PCP.  I informed pt to use a humidifier to help with the dryness and also that she may can get the arydry to use in her nose from the pharmacy ?

## 2022-03-04 ENCOUNTER — Encounter: Payer: Self-pay | Admitting: Nurse Practitioner

## 2022-03-04 ENCOUNTER — Other Ambulatory Visit: Payer: Self-pay

## 2022-03-04 ENCOUNTER — Ambulatory Visit (INDEPENDENT_AMBULATORY_CARE_PROVIDER_SITE_OTHER): Payer: Medicare Other | Admitting: Nurse Practitioner

## 2022-03-04 VITALS — BP 158/88 | HR 95 | Temp 98.5°F | Resp 16 | Ht 65.0 in | Wt 131.6 lb

## 2022-03-04 DIAGNOSIS — I10 Essential (primary) hypertension: Secondary | ICD-10-CM

## 2022-03-04 DIAGNOSIS — J0111 Acute recurrent frontal sinusitis: Secondary | ICD-10-CM | POA: Diagnosis not present

## 2022-03-04 DIAGNOSIS — J4541 Moderate persistent asthma with (acute) exacerbation: Secondary | ICD-10-CM

## 2022-03-04 DIAGNOSIS — J309 Allergic rhinitis, unspecified: Secondary | ICD-10-CM

## 2022-03-04 DIAGNOSIS — F411 Generalized anxiety disorder: Secondary | ICD-10-CM

## 2022-03-04 MED ORDER — AZITHROMYCIN 250 MG PO TABS: ORAL_TABLET | ORAL | 0 refills | Status: AC

## 2022-03-04 NOTE — Progress Notes (Cosign Needed)
[facility] [place], [place] [address]  Internal MEDICINE  Office Visit Note  Patient Name: Virginia Warner  [id number]  [id number]  Date of Service: 03/04/2022  Chief Complaint  Patient presents with   Follow-up    Discuss allergies / asthma    Hyperlipidemia   Hypertension   Diabetes    HPI Analilia presents for follow-up visit for allergic rhinitis and asthma.  She reports that her nose has been so dry on the inside that she has had some nosebleeds which have been minor but did cause drainage down the back of the throat that was bloody.  She has been taking Claritin over-the-counter for her allergies recently.  She also uses Flonase nasal spray and a saline nasal spray to keep the inside of her nose moist and not dry.  She did not want to try montelukast. Her blood pressure is elevated in the office today which is normal for her.  Patient has whitecoat syndrome on top of her hypertension.  Her blood pressure is normal on her meds when she is at home but is elevated when she is in the office. She does feel like she has a headache and sinus pressure and that the nasal congestion and drainage down her throat could be a sinus infection developing and she is requesting a Z-Pak today Patient has generalized anxiety disorder and often worries about her health, medical problems and possible medical problems that she does not have or has not been diagnosed with.  Patient has a knowledge that she has these thoughts and that she tends to worry increasingly over her health and medical problems but will come in the office from time to time to have specific concerns evaluated to provide reassurance or peace of mind   Current Medication: Outpatient Encounter Medications as of 03/04/2022  Medication Sig Note   acetaminophen (TYLENOL) 500 MG tablet Take 500 mg by mouth daily as needed for moderate pain.     alendronate (FOSAMAX) 70 MG tablet TAKE 1 TABLET EVERY 7 DAYS WITH A FULL  GLASS OF WATER ON AN EMPTY STOMACH DO NOT LIE DOWN FOR AT LEAST 30 MIN    aspirin EC 81 MG tablet Take 81 mg by mouth every other day.    azithromycin (ZITHROMAX) 250 MG tablet Take 2 tablets on day 1, then 1 tablet daily on days 2 through 5    busPIRone (BUSPAR) 10 MG tablet Take 1 tablet (10 mg total) by mouth 2 (two) times daily.    diltiazem (CARDIZEM CD) 120 MG 24 hr capsule TAKE 1 CAPSULE BY MOUTH ONCE DAILY    esomeprazole (NEXIUM) 20 MG capsule TAKE 1 CAPSULE EVERY DAY    famotidine (PEPCID) 20 MG tablet Take 1 tablet (20 mg total) by mouth daily.    fluticasone (FLONASE) 50 MCG/ACT nasal spray Place 2 sprays into both nostrils daily.    Fluticasone-Salmeterol (AIRDUO RESPICLICK 595/63) 875-64 MCG/ACT AEPB 1 inhalation BID about 12 hours apart    gabapentin (NEURONTIN) 100 MG capsule Take 1 capsule in the morning, 1 capsule in the afternoon and 3 capsules at bedtime.    latanoprost (XALATAN) 0.005 % ophthalmic solution Place 1 drop into both eyes daily.  06/26/2020: Pt states she uses whenever not always at night   letrozole (FEMARA) 2.5 MG tablet TAKE 1 TABLET BY MOUTH DAILY    lisinopril (ZESTRIL) 20 MG tablet Take 1 tablet by mouth in the morning and one in the evening  Multiple Vitamin (MULTIVITAMIN WITH MINERALS) TABS tablet Take 1 tablet by mouth daily.  06/26/2020: Pt ran out, needs to get more   [DISCONTINUED] amoxicillin-clavulanate (AUGMENTIN) 875-125 MG tablet Take 1 tablet by mouth 2 (two) times daily. (Patient not taking: Reported on 03/04/2022)    No facility-administered encounter medications on file as of 03/04/2022.    Surgical History: Past Surgical History:  Procedure Laterality Date   ABDOMINAL HYSTERECTOMY     partial   BREAST BIOPSY Left 06/12/2020   [place] Bx, Ribbon Clip, path pending    BREAST LUMPECTOMY Left 07/04/2020   [facility] lumpectomy with radiation   BREAST LUMPECTOMY,RADIO FREQ LOCALIZER,AXILLARY SENTINEL LYMPH NODE BIOPSY Left 07/10/2020   Procedure: BREAST  LUMPECTOMY,RADIO FREQ LOCALIZER,AXILLARY SENTINEL LYMPH NODE BIOPSY;  Surgeon: Jules Husbands, MD;  Location: [facility] ORS;  Service: General;  Laterality: Left;   COLONOSCOPY WITH PROPOFOL N/A 10/18/2015   Procedure: COLONOSCOPY WITH PROPOFOL;  Surgeon: Manya Silvas, MD;  Location: [facility] ENDOSCOPY;  Service: Endoscopy;  Laterality: N/A;   COLONOSCOPY WITH PROPOFOL N/A 08/19/2021   Procedure: COLONOSCOPY WITH PROPOFOL;  Surgeon: [name], MD;  Location: [facility] ENDOSCOPY;  Service: Endoscopy;  Laterality: N/A;   INGUINAL HERNIA REPAIR     age 36's   POLYPECTOMY      Medical History: Past Medical History:  Diagnosis Date   Allergy    Arthritis    Breast cancer ([place])    Diabetes mellitus without complication ([place])    Hyperlipidemia    Hypertension    Seasonal allergies     Family History: Family History  Problem Relation Age of Onset   Cancer Brother    Breast cancer Paternal Aunt     Social History   Socioeconomic History   Marital status: Divorced    Spouse name: Not on file   Number of children: Not on file   Years of education: Not on file   Highest education level: Not on file  Occupational History   Not on file  Tobacco Use   Smoking status: Former    Types: Cigarettes    Quit date: 11/2007    Years since quitting: 14.2   Smokeless tobacco: Never  Vaping Use   Vaping Use: Never used  Substance and Sexual Activity   Alcohol use: Not Currently    Alcohol/week: 0.0 standard drinks   Drug use: No   Sexual activity: Not Currently  Other Topics Concern   Not on file  Social History Narrative   Not on file   Social Determinants of Health   Financial Resource Strain: Not on file  Food Insecurity: Not on file  Transportation Needs: Not on file  Physical Activity: Not on file  Stress: Not on file  Social Connections: Not on file  Intimate Partner Violence: Not on file      Review of Systems  Constitutional:  Negative for chills, fatigue and  unexpected weight change.  HENT:  Positive for congestion, postnasal drip, rhinorrhea, sinus pressure and sneezing. Negative for sore throat.   Eyes:  Negative for redness.  Respiratory:  Positive for wheezing (intermittent). Negative for cough, chest tightness and shortness of breath.   Cardiovascular:  Negative for chest pain and palpitations.  Gastrointestinal:  Negative for abdominal pain, constipation, diarrhea, nausea and vomiting.  Genitourinary:  Negative for dysuria and frequency.  Musculoskeletal:  Negative for arthralgias, back pain, joint swelling and neck pain.  Skin:  Negative for rash.  Neurological: Negative.  Negative for tremors and numbness.  Hematological:  Negative for adenopathy. Does not bruise/bleed easily.  Psychiatric/Behavioral:  Negative for behavioral problems (Depression), sleep disturbance and suicidal ideas. The patient is not nervous/anxious.    Vital Signs: BP (!) 158/88 Comment: 150/85   Pulse 95    Temp 98.5 F (36.9 C)    Resp 16    Ht '5\' 5"'$  (1.651 m)    Wt 131 lb 9.6 oz (59.7 kg)    SpO2 98%    BMI 21.90 kg/m    Physical Exam Vitals reviewed.  Constitutional:      General: She is not in acute distress.    Appearance: Normal appearance. She is normal weight.  HENT:     Head: Normocephalic and atraumatic.     Nose: Congestion present.     Mouth/Throat:     Mouth: Mucous membranes are moist.     Pharynx: Oropharynx is clear.  Eyes:     Pupils: Pupils are equal, round, and reactive to light.  Cardiovascular:     Rate and Rhythm: Normal rate and regular rhythm.     Heart sounds: Normal heart sounds. No murmur heard. Pulmonary:     Effort: Pulmonary effort is normal. No respiratory distress.     Breath sounds: Normal breath sounds. No wheezing.  Neurological:     Mental Status: She is oriented to person, place, and time.  Psychiatric:        Mood and Affect: Mood normal.        Behavior: Behavior normal.       Assessment/Plan: 1.  Chronic allergic rhinitis Discussed trying OTC medications such as xyzal and nasocort nasal spray as alternatives to claritin and fonase  2. Acute recurrent frontal sinusitis Zpack prescribed  3. Moderate persistent asthma with acute exacerbation Continue advair inhaler   4. Elevated blood pressure reading in office with white coat syndrome, with diagnosis of hypertension Elevated in office but normal at home. Continue to monitor at home.   5. GAD (generalized anxiety disorder) Increased anxiety regarding illness and chronic medical conditions. Discussed in detail with patient. Has buspirone available if necessary.    General Counseling: lasundra hascall understanding of the findings of todays visit and agrees with plan of treatment. I have discussed any further diagnostic evaluation that may be needed or ordered today. We also reviewed her medications today. she has been encouraged to call the office with any questions or concerns that should arise related to todays visit.    No orders of the defined types were placed in this encounter.   Meds ordered this encounter  Medications   azithromycin (ZITHROMAX) 250 MG tablet    Sig: Take 2 tablets on day 1, then 1 tablet daily on days 2 through 5    Dispense:  6 tablet    Refill:  0    Return for previously scheduled, F/U in may. .   Total time spent:30 Minutes Time spent includes review of chart, medications, test results, and follow up plan with the patient.   [place] Controlled Substance Database was reviewed by me.  This patient was seen by Jonetta Osgood, FNP-C in collaboration with Dr. Clayborn Bigness as a part of collaborative care agreement.   Zharia Conrow R. Valetta Fuller, MSN, FNP-C Internal medicine

## 2022-03-05 ENCOUNTER — Ambulatory Visit: Payer: Medicare Other | Admitting: Nurse Practitioner

## 2022-03-05 ENCOUNTER — Encounter: Payer: Self-pay | Admitting: Nurse Practitioner

## 2022-03-12 ENCOUNTER — Ambulatory Visit: Payer: Medicare Other | Admitting: Nurse Practitioner

## 2022-03-19 ENCOUNTER — Other Ambulatory Visit: Payer: Self-pay | Admitting: Nurse Practitioner

## 2022-03-20 ENCOUNTER — Telehealth: Payer: Self-pay

## 2022-03-20 NOTE — Telephone Encounter (Signed)
Okay, Thank u, I can see her sooner than her follow up app

## 2022-03-20 NOTE — Telephone Encounter (Signed)
Pt advised that keep track on BP reading and continue med as prescribed and call back if bp is high we can make her appt  ?

## 2022-03-27 ENCOUNTER — Encounter: Payer: Self-pay | Admitting: Physician Assistant

## 2022-03-27 ENCOUNTER — Ambulatory Visit (INDEPENDENT_AMBULATORY_CARE_PROVIDER_SITE_OTHER): Payer: Medicare Other | Admitting: Physician Assistant

## 2022-03-27 VITALS — BP 142/82 | HR 82 | Temp 98.8°F | Resp 16 | Ht 65.0 in | Wt 132.0 lb

## 2022-03-27 DIAGNOSIS — F411 Generalized anxiety disorder: Secondary | ICD-10-CM

## 2022-03-27 DIAGNOSIS — I10 Essential (primary) hypertension: Secondary | ICD-10-CM

## 2022-03-27 NOTE — Progress Notes (Signed)
[facility] ?[address] ?[place], [place] [address] ? ?Internal MEDICINE  ?Office Visit Note ? ?Patient Name: Virginia Warner ? [id number]  ?[id number] ? ?Date of Service: 04/09/2022 ? ?Chief Complaint  ?Patient presents with  ? Acute Visit  ? Hypertension  ? ? ?HPI ?Pt is here for sick visit for concerns over BP ?-BP was 160-170/70-80 before meds, then takes meds around 10AM. At 11Am her Bp was 133/85 and does better in afternoon. -Wants to know why it is so high in Am but good in afternoon. Explained that it is high before meds and then meds kick in and lower BP in to appropriate range based on her readings. Also discussed that BP may rise when she is anxious and if she is checking many times per day worried about it then it may fluctuate some. ?-She always has initially high BP in office due to white coat syndrome, but improves on recheck ? ?Current Medication: ?Outpatient Encounter Medications as of 03/27/2022  ?Medication Sig Note  ? acetaminophen (TYLENOL) 500 MG tablet Take 500 mg by mouth daily as needed for moderate pain.    ? alendronate (FOSAMAX) 70 MG tablet TAKE 1 TABLET EVERY 7 DAYS WITH A FULL GLASS OF WATER ON AN EMPTY STOMACH DO NOT LIE DOWN FOR AT LEAST 30 MIN   ? aspirin EC 81 MG tablet Take 81 mg by mouth every other day.   ? busPIRone (BUSPAR) 10 MG tablet Take 1 tablet (10 mg total) by mouth 2 (two) times daily. (Patient not taking: Reported on 04/01/2022)   ? diltiazem (CARDIZEM CD) 120 MG 24 hr capsule TAKE 1 CAPSULE BY MOUTH ONCE DAILY   ? esomeprazole (NEXIUM) 20 MG capsule TAKE 1 CAPSULE EVERY DAY (Patient not taking: Reported on 04/01/2022)   ? famotidine (PEPCID) 20 MG tablet Take 1 tablet (20 mg total) by mouth daily.   ? fluticasone (FLONASE) 50 MCG/ACT nasal spray Place 2 sprays into both nostrils daily.   ? Fluticasone-Salmeterol (AIRDUO RESPICLICK 403/47) 425-95 MCG/ACT AEPB 1 inhalation BID about 12 hours apart   ? latanoprost (XALATAN) 0.005 % ophthalmic solution Place 1 drop  into both eyes daily.  (Patient not taking: Reported on 04/01/2022) 06/26/2020: Pt states she uses whenever not always at night  ? letrozole (FEMARA) 2.5 MG tablet TAKE 1 TABLET BY MOUTH DAILY   ? Multiple Vitamin (MULTIVITAMIN WITH MINERALS) TABS tablet Take 1 tablet by mouth daily.  06/26/2020: Pt ran out, needs to get more  ? [DISCONTINUED] gabapentin (NEURONTIN) 100 MG capsule Take 1 capsule in the morning, 1 capsule in the afternoon and 3 capsules at bedtime.   ? [DISCONTINUED] lisinopril (ZESTRIL) 20 MG tablet Take 1 tablet by mouth in the morning and one in the evening   ? ?No facility-administered encounter medications on file as of 03/27/2022.  ? ? ?Surgical History: ?Past Surgical History:  ?Procedure Laterality Date  ? ABDOMINAL HYSTERECTOMY    ? partial  ? BREAST BIOPSY Left 06/12/2020  ? [place] Bx, Ribbon Clip, path pending   ? BREAST LUMPECTOMY Left 07/04/2020  ? [facility] lumpectomy with radiation  ? BREAST LUMPECTOMY,RADIO FREQ LOCALIZER,AXILLARY SENTINEL LYMPH NODE BIOPSY Left 07/10/2020  ? Procedure: BREAST LUMPECTOMY,RADIO FREQ LOCALIZER,AXILLARY SENTINEL LYMPH NODE BIOPSY;  Surgeon: Jules Husbands, MD;  Location: [facility] ORS;  Service: General;  Laterality: Left;  ? COLONOSCOPY WITH PROPOFOL N/A 10/18/2015  ? Procedure: COLONOSCOPY WITH PROPOFOL;  Surgeon: Manya Silvas, MD;  Location: [facility] ENDOSCOPY;  Service: Endoscopy;  Laterality: N/A;  ? COLONOSCOPY WITH PROPOFOL N/A 08/19/2021  ? Procedure: COLONOSCOPY WITH PROPOFOL;  Surgeon: [name], MD;  Location: [facility] ENDOSCOPY;  Service: Endoscopy;  Laterality: N/A;  ? INGUINAL HERNIA REPAIR    ? age 8's  ? POLYPECTOMY    ? ? ?Medical History: ?Past Medical History:  ?Diagnosis Date  ? Allergy   ? Arthritis   ? Breast cancer ([place])   ? Diabetes mellitus without complication ([place])   ? Hyperlipidemia   ? Hypertension   ? Seasonal allergies   ? ? ?Family History: ?Family History  ?Problem Relation Age of Onset  ? Cancer Brother   ? Breast cancer Paternal Aunt    ? ? ?Social History  ? ?Socioeconomic History  ? Marital status: Divorced  ?  Spouse name: Not on file  ? Number of children: Not on file  ? Years of education: Not on file  ? Highest education level: Not on file  ?Occupational History  ? Not on file  ?Tobacco Use  ? Smoking status: Former  ?  Types: Cigarettes  ?  Quit date: 11/2007  ?  Years since quitting: 14.3  ? Smokeless tobacco: Never  ?Vaping Use  ? Vaping Use: Never used  ?Substance and Sexual Activity  ? Alcohol use: Not Currently  ?  Alcohol/week: 0.0 standard drinks  ? Drug use: No  ? Sexual activity: Not Currently  ?Other Topics Concern  ? Not on file  ?Social History Narrative  ? Not on file  ? ?Social Determinants of Health  ? ?Financial Resource Strain: Not on file  ?Food Insecurity: Not on file  ?Transportation Needs: Not on file  ?Physical Activity: Not on file  ?Stress: Not on file  ?Social Connections: Not on file  ?Intimate Partner Violence: Not on file  ? ? ? ? ?Review of Systems  ?Constitutional:  Negative for fatigue and fever.  ?HENT:  Negative for congestion, mouth sores and postnasal drip.   ?Respiratory:  Negative for cough.   ?Cardiovascular:  Negative for chest pain.  ?Genitourinary:  Negative for flank pain.  ?Neurological:  Negative for headaches.  ?Psychiatric/Behavioral:  The patient is nervous/anxious.   ? ?Vital Signs: ?BP (!) 142/82 Comment: 174/88  Pulse 82   Temp 98.8 ?F (37.1 ?C)   Resp 16   Ht '5\' 5"'$  (1.651 m)   Wt 132 lb (59.9 kg)   SpO2 98%   BMI 21.97 kg/m?  ? ? ?Physical Exam ?Vitals reviewed.  ?Constitutional:   ?   General: She is not in acute distress. ?   Appearance: Normal appearance. She is normal weight. She is not ill-appearing.  ?HENT:  ?   Head: Normocephalic and atraumatic.  ?Eyes:  ?   Pupils: Pupils are equal, round, and reactive to light.  ?Cardiovascular:  ?   Rate and Rhythm: Normal rate and regular rhythm.  ?Pulmonary:  ?   Effort: Pulmonary effort is normal. No respiratory distress.   ?Neurological:  ?   Mental Status: She is alert and oriented to person, place, and time.  ?   Cranial Nerves: No cranial nerve deficit.  ?   Coordination: Coordination normal.  ?   Gait: Gait normal.  ?Psychiatric:     ?   Mood and Affect: Affect normal. Mood is anxious.     ?   Behavior: Behavior normal. Behavior is cooperative.  ? ? ? ? ? ?Assessment/Plan: ?1. Essential hypertension ?Discussed goal Bp after taking medications and to continue to  monitor. Will continue current medications ? ?2. GAD (generalized anxiety disorder) ?Continue current medications ? ? ?General Counseling: rosabel sermeno understanding of the findings of todays visit and agrees with plan of treatment. I have discussed any further diagnostic evaluation that may be needed or ordered today. We also reviewed her medications today. she has been encouraged to call the office with any questions or concerns that should arise related to todays visit. ? ? ? ?No orders of the defined types were placed in this encounter. ? ? ?No orders of the defined types were placed in this encounter. ? ? ?This patient was seen by Drema Dallas, PA-C in collaboration with Dr. Clayborn Bigness as a part of collaborative care agreement. ? ? ?Total time spent:30 Minutes ?Time spent includes review of chart, medications, test results, and follow up plan with the patient.  ? ? ? ? ?Dr [name] ?Internal medicine  ?

## 2022-04-01 ENCOUNTER — Encounter: Payer: Self-pay | Admitting: Oncology

## 2022-04-01 ENCOUNTER — Inpatient Hospital Stay: Payer: Medicare Other | Attending: Oncology | Admitting: Oncology

## 2022-04-01 VITALS — BP 157/85 | HR 91 | Temp 98.2°F | Resp 16 | Wt 131.4 lb

## 2022-04-01 DIAGNOSIS — Z170 Estrogen receptor positive status [ER+]: Secondary | ICD-10-CM | POA: Insufficient documentation

## 2022-04-01 DIAGNOSIS — C50912 Malignant neoplasm of unspecified site of left female breast: Secondary | ICD-10-CM | POA: Diagnosis present

## 2022-04-01 DIAGNOSIS — M810 Age-related osteoporosis without current pathological fracture: Secondary | ICD-10-CM

## 2022-04-01 DIAGNOSIS — Z853 Personal history of malignant neoplasm of breast: Secondary | ICD-10-CM

## 2022-04-01 DIAGNOSIS — Z923 Personal history of irradiation: Secondary | ICD-10-CM | POA: Insufficient documentation

## 2022-04-01 DIAGNOSIS — Z08 Encounter for follow-up examination after completed treatment for malignant neoplasm: Secondary | ICD-10-CM | POA: Diagnosis not present

## 2022-04-01 DIAGNOSIS — Z79811 Long term (current) use of aromatase inhibitors: Secondary | ICD-10-CM

## 2022-04-01 DIAGNOSIS — Z803 Family history of malignant neoplasm of breast: Secondary | ICD-10-CM | POA: Insufficient documentation

## 2022-04-01 NOTE — Progress Notes (Signed)
Pt states she will like to take something that calms her down but not habit forming.  ?

## 2022-04-03 ENCOUNTER — Other Ambulatory Visit: Payer: Self-pay | Admitting: Internal Medicine

## 2022-04-03 ENCOUNTER — Other Ambulatory Visit: Payer: Self-pay | Admitting: Nurse Practitioner

## 2022-04-03 DIAGNOSIS — G5793 Unspecified mononeuropathy of bilateral lower limbs: Secondary | ICD-10-CM

## 2022-04-06 NOTE — Progress Notes (Signed)
? ? ? ?Hematology/Oncology Consult note ?[facility]  ?[phone] [phone] ? ?Patient Care Team: ?Jonetta Osgood, NP as PCP - General (Nurse Practitioner) ?Rico Junker, RN as Registered Nurse ?Sindy Guadeloupe, MD as Consulting Physician (Oncology) ?Jules Husbands, MD as Consulting Physician (General Surgery) ?Noreene Filbert, MD as Radiation Oncologist (Radiation Oncology)  ? ?Name of the patient: Virginia Warner  ?[id number]  ?1946-04-14  ? ?Date of visit: 04/06/22 ? 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 ?Chief complaint/ Reason for visit-follow-up of breast cancer on letrozole ? ?Heme/Onc history: Patient is a 76 year old female with a past medical history significant for osteoporosis hematuria who  underwent a screening bilateral mammogram on 05/29/2020 which showed a possible mass in the left breast.  This was followed by diagnostic mammogram and ultrasound which showed a 7 x 7 x 4 mm mass 3 cm from the nipple at the 7 o'clock position.  Normal-appearing left axillary lymph nodes.  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  ER strongly positive greater than 90%, PR 1 to 10% positive and HER-2 negative ?  ?Patient underwent lumpectomy and sentinel lymph node biopsy on 07/10/2020.  Final pathology showed 8 mm grade 1 invasive mucinous carcinoma with negative margins.  Sentinel lymph nodes were negative for malignancy. ?  ?Patient completed adjuvant radiation therapy and started letrozole in November 2021 ? ?Interval history-patient is tolerating letrozole along with calcium vitamin D and Fosamax which she takes for osteoporosis.  Denies any breast concerns at this time.  Appetite and weight have remained stable ? ?ECOG PS- 1 ?Pain scale- 0 ? ?Review of systems- Review of Systems   ?Constitutional:  Positive for malaise/fatigue. Negative for chills, fever and weight loss.  ?HENT:  Negative for congestion, ear discharge and nosebleeds.   ?Eyes:  Negative for blurred vision.  ?Respiratory:  Negative for cough, hemoptysis, sputum production, shortness of breath and wheezing.   ?Cardiovascular:  Negative for chest pain, palpitations, orthopnea and claudication.  ?Gastrointestinal:  Negative for abdominal pain, blood in stool, constipation, diarrhea, heartburn, melena, nausea and vomiting.  ?Genitourinary:  Negative for dysuria, flank pain, frequency, hematuria and urgency.  ?Musculoskeletal:  Negative for back pain, joint pain and myalgias.  ?Skin:  Negative for rash.  ?Neurological:  Negative for dizziness, tingling, focal weakness, seizures, weakness and headaches.  ?Endo/Heme/Allergies:  Does not bruise/bleed easily.  ?Psychiatric/Behavioral:  Negative for depression and suicidal ideas. The patient does not have insomnia.    ?Allergies  ?Allergen Reactions  ? Aspirin Nausea And Vomiting  ?  Cannot tolerate unless enteric coated  ? Atorvastatin Other (See Comments)  ?  Bone pain  ? ? ? ?Past Medical History:  ?Diagnosis Date  ? Allergy   ? Arthritis   ? Breast cancer ([place])   ? Diabetes mellitus without complication ([place])   ? Hyperlipidemia   ? Hypertension   ? Seasonal allergies   ? ? ? ?Past Surgical History:  ?Procedure Laterality Date  ? ABDOMINAL HYSTERECTOMY    ? partial  ? BREAST BIOPSY Left 06/12/2020  ? [place] Bx, Ribbon Clip, path pending   ? BREAST LUMPECTOMY Left 07/04/2020  ? [facility] lumpectomy with radiation  ? BREAST LUMPECTOMY,RADIO FREQ LOCALIZER,AXILLARY SENTINEL LYMPH NODE BIOPSY Left 07/10/2020  ? Procedure: BREAST LUMPECTOMY,RADIO FREQ LOCALIZER,AXILLARY SENTINEL LYMPH NODE BIOPSY;  Surgeon: Jules Husbands, MD;  Location:  [facility] ORS;  Service: General;  Laterality: Left;  ? COLONOSCOPY WITH PROPOFOL N/A 10/18/2015  ? Procedure: COLONOSCOPY WITH PROPOFOL;  Surgeon: Manya Silvas, MD;   Location: [facility] ENDOSCOPY;  Service: Endoscopy;  Laterality: N/A;  ? COLONOSCOPY WITH PROPOFOL N/A 08/19/2021  ? Procedure: COLONOSCOPY WITH PROPOFOL;  Surgeon: [name], MD;  Location: [facility] ENDOSCOPY;  Service: Endoscopy;  Laterality: N/A;  ? INGUINAL HERNIA REPAIR    ? age 19's  ? POLYPECTOMY    ? ? ?Social History  ? ?Socioeconomic History  ? Marital status: Divorced  ?  Spouse name: Not on file  ? Number of children: Not on file  ? Years of education: Not on file  ? Highest education level: Not on file  ?Occupational History  ? Not on file  ?Tobacco Use  ? Smoking status: Former  ?  Types: Cigarettes  ?  Quit date: 11/2007  ?  Years since quitting: 14.3  ? Smokeless tobacco: Never  ?Vaping Use  ? Vaping Use: Never used  ?Substance and Sexual Activity  ? Alcohol use: Not Currently  ?  Alcohol/week: 0.0 standard drinks  ? Drug use: No  ? Sexual activity: Not Currently  ?Other Topics Concern  ? Not on file  ?Social History Narrative  ? Not on file  ? ?Social Determinants of Health  ? ?Financial Resource Strain: Not on file  ?Food Insecurity: Not on file  ?Transportation Needs: Not on file  ?Physical Activity: Not on file  ?Stress: Not on file  ?Social Connections: Not on file  ?Intimate Partner Violence: Not on file  ? ? ?Family History  ?Problem Relation Age of Onset  ? Cancer Brother   ? Breast cancer Paternal Aunt   ? ? ? ?Current Outpatient Medications:  ?  acetaminophen (TYLENOL) 500 MG tablet, Take 500 mg by mouth daily as needed for moderate pain. , Disp: , Rfl:  ?  alendronate (FOSAMAX) 70 MG tablet, TAKE 1 TABLET EVERY 7 DAYS WITH A FULL GLASS OF WATER ON AN EMPTY STOMACH DO NOT LIE DOWN FOR AT LEAST 30 MIN, Disp: 4 tablet, Rfl: 3 ?  aspirin EC 81 MG tablet, Take 81 mg by mouth every other day., Disp: , Rfl:  ?  diltiazem (CARDIZEM CD) 120 MG 24 hr capsule, TAKE 1 CAPSULE BY MOUTH ONCE DAILY, Disp: 30 capsule, Rfl: 3 ?  famotidine (PEPCID) 20 MG tablet, Take 1 tablet (20 mg total) by mouth  daily., Disp: 30 tablet, Rfl: 2 ?  fluticasone (FLONASE) 50 MCG/ACT nasal spray, Place 2 sprays into both nostrils daily., Disp: 16 g, Rfl: 2 ?  Fluticasone-Salmeterol (AIRDUO RESPICLICK 353/61) 443-15 MCG/ACT AEPB, 1 inhalation BID about 12 hours apart, Disp: 1 each, Rfl: 3 ?  letrozole (FEMARA) 2.5 MG tablet, TAKE 1 TABLET BY MOUTH DAILY, Disp: 30 tablet, Rfl: 1 ?  Multiple Vitamin (MULTIVITAMIN WITH MINERALS) TABS tablet, Take 1 tablet by mouth daily. , Disp: , Rfl:  ?  busPIRone (BUSPAR) 10 MG tablet, Take 1 tablet (10 mg total) by mouth 2 (two) times daily. (Patient not taking: Reported on 04/01/2022), Disp: 60 tablet, Rfl: 2 ?  esomeprazole (NEXIUM) 20 MG capsule, TAKE 1 CAPSULE EVERY DAY (Patient not taking: Reported on 04/01/2022), Disp: 90 capsule, Rfl: 3 ?  gabapentin (NEURONTIN) 100 MG capsule, TAKE ONE CAPSULE BY MOUTH EVERY MORNING,ONE CAPSULE IN THE AFTERNOON, AND 3 CAPSULES AT BEDTIME., Disp: 150 capsule, Rfl: 3 ?  latanoprost (XALATAN) 0.005 % ophthalmic solution, Place 1 drop into  both eyes daily.  (Patient not taking: Reported on 04/01/2022), Disp: , Rfl:  ?  lisinopril (ZESTRIL) 20 MG tablet, TAKE ONE TABLET BY MOUTH EVERY MORNING AND ONE IN EVENING, Disp: 180 tablet, Rfl: 3 ? ?Physical exam:  ?Vitals:  ? 04/01/22 1113  ?BP: (!) 157/85  ?Pulse: 91  ?Resp: 16  ?Temp: 98.2 ?F (36.8 ?C)  ?Weight: 131 lb 6.4 oz (59.6 kg)  ? ?Physical Exam ?Constitutional:   ?   General: She is not in acute distress. ?Eyes:  ?   Pupils: Pupils are equal, round, and reactive to light.  ?Cardiovascular:  ?   Rate and Rhythm: Normal rate and regular rhythm.  ?   Heart sounds: Normal heart sounds.  ?Pulmonary:  ?   Effort: Pulmonary effort is normal.  ?   Breath sounds: Normal breath sounds.  ?Abdominal:  ?   General: Bowel sounds are normal.  ?   Palpations: Abdomen is soft.  ?Skin: ?   General: Skin is warm and dry.  ?Neurological:  ?   Mental Status: She is alert and oriented to person, place, and time.  ? Breast exam was  performed in seated and lying down position. ?Patient is status post left lumpectomy with a well-healed surgical scar. No evidence of any palpable masses. No evidence of axillary adenopathy. No evidence of any palpable

## 2022-04-09 NOTE — Patient Instructions (Signed)

## 2022-04-17 ENCOUNTER — Other Ambulatory Visit: Payer: Self-pay

## 2022-04-17 DIAGNOSIS — Z170 Estrogen receptor positive status [ER+]: Secondary | ICD-10-CM

## 2022-04-21 ENCOUNTER — Other Ambulatory Visit: Payer: Self-pay | Admitting: Oncology

## 2022-04-21 ENCOUNTER — Other Ambulatory Visit: Payer: Self-pay | Admitting: Nurse Practitioner

## 2022-04-21 DIAGNOSIS — R0981 Nasal congestion: Secondary | ICD-10-CM

## 2022-04-21 DIAGNOSIS — J0110 Acute frontal sinusitis, unspecified: Secondary | ICD-10-CM

## 2022-04-22 ENCOUNTER — Ambulatory Visit: Payer: Medicare Other | Admitting: Nurse Practitioner

## 2022-05-16 ENCOUNTER — Ambulatory Visit (INDEPENDENT_AMBULATORY_CARE_PROVIDER_SITE_OTHER): Payer: Medicare Other | Admitting: Nurse Practitioner

## 2022-05-16 ENCOUNTER — Encounter: Payer: Self-pay | Admitting: Nurse Practitioner

## 2022-05-16 VITALS — BP 161/91 | HR 91 | Temp 98.2°F | Resp 16 | Ht 65.0 in | Wt 136.0 lb

## 2022-05-16 DIAGNOSIS — I10 Essential (primary) hypertension: Secondary | ICD-10-CM | POA: Diagnosis not present

## 2022-05-16 DIAGNOSIS — F411 Generalized anxiety disorder: Secondary | ICD-10-CM | POA: Diagnosis not present

## 2022-05-16 NOTE — Progress Notes (Signed)
[facility] [place], [place] [address]  Internal MEDICINE  Office Visit Note  Patient Name: Virginia Warner  [id number]  [id number]  Date of Service: 05/16/2022  Chief Complaint  Patient presents with   Acute Visit    Left leg nerve jumping and left hand have nerve issues too, hip pain in left side      HPI Virginia Warner presents for an acute sick visit for feeling her left leg nerve jumping and left hand nerve pain.  Also having left hip pain.  Patient can take her meloxicam 15 mg daily.  Patient has high anxiety levels and is often worried about things she cannot control or contracting various medical conditions.  Her blood pressure has been normal at home per patient report her blood pressure is elevated today but she also has whitecoat syndrome.  She will be having a nerve conduction study done with her neurologist soon which will provide further information so that she can be adequately treated. I had previously prescribed a small dose of buspirone that she was willing to try for her anxiety but has not started the medication yet despite knowing that she needs something to control her anxiety and having others in her life encouraged her to try the medication for at least 30 days before deciding if the medication is the right medication or not for her. Encourage patient to take some time for herself and do something she enjoys.   Current Medication:  Outpatient Encounter Medications as of 05/16/2022  Medication Sig Note   acetaminophen (TYLENOL) 500 MG tablet Take 500 mg by mouth daily as needed for moderate pain.     alendronate (FOSAMAX) 70 MG tablet TAKE 1 TABLET EVERY 7 DAYS WITH A FULL GLASS OF WATER ON AN EMPTY STOMACH DO NOT LIE DOWN FOR AT LEAST 30 MIN    aspirin EC 81 MG tablet Take 81 mg by mouth every other day.    diltiazem (CARDIZEM CD) 120 MG 24 hr capsule TAKE 1 CAPSULE BY MOUTH ONCE DAILY    esomeprazole (NEXIUM) 20 MG capsule TAKE 1 CAPSULE EVERY DAY     famotidine (PEPCID) 20 MG tablet Take 1 tablet (20 mg total) by mouth daily.    fluticasone (FLONASE) 50 MCG/ACT nasal spray SPRAY TWICE INTO EACH NOSTRIL ONCE DAILY    Fluticasone-Salmeterol (AIRDUO RESPICLICK 762/26) 333-54 MCG/ACT AEPB 1 inhalation BID about 12 hours apart    gabapentin (NEURONTIN) 100 MG capsule TAKE ONE CAPSULE BY MOUTH EVERY MORNING,ONE CAPSULE IN THE AFTERNOON, AND 3 CAPSULES AT BEDTIME.    latanoprost (XALATAN) 0.005 % ophthalmic solution Place 1 drop into both eyes daily. 06/26/2020: Pt states she uses whenever not always at night   letrozole (FEMARA) 2.5 MG tablet TAKE 1 TABLET BY MOUTH DAILY    lisinopril (ZESTRIL) 20 MG tablet TAKE ONE TABLET BY MOUTH EVERY MORNING AND ONE IN EVENING    Multiple Vitamin (MULTIVITAMIN WITH MINERALS) TABS tablet Take 1 tablet by mouth daily.  06/26/2020: Pt ran out, needs to get more   busPIRone (BUSPAR) 10 MG tablet Take 1 tablet (10 mg total) by mouth 2 (two) times daily. (Patient not taking: Reported on 04/01/2022)    No facility-administered encounter medications on file as of 05/16/2022.      Medical History: Past Medical History:  Diagnosis Date   Allergy    Arthritis    Breast cancer ([place])    Diabetes mellitus without complication ([place])    Hyperlipidemia  Hypertension    Seasonal allergies      Vital Signs: BP (!) 161/91 Comment: 171/96  Pulse 91   Temp 98.2 F (36.8 C)   Resp 16   Ht '5\' 5"'$  (1.651 m)   Wt 136 lb (61.7 kg)   SpO2 99%   BMI 22.63 kg/m    Review of Systems  Constitutional:  Negative for fatigue and fever.  HENT:  Negative for congestion, mouth sores and postnasal drip.   Respiratory:  Negative for cough.   Cardiovascular:  Negative for chest pain.  Genitourinary:  Negative for flank pain.  Neurological:  Negative for headaches.  Psychiatric/Behavioral:  The patient is nervous/anxious.     Physical Exam Vitals reviewed.  Constitutional:      General: She is not in acute distress.     Appearance: Normal appearance. She is normal weight. She is not ill-appearing.  HENT:     Head: Normocephalic and atraumatic.  Eyes:     Pupils: Pupils are equal, round, and reactive to light.  Cardiovascular:     Rate and Rhythm: Normal rate and regular rhythm.  Pulmonary:     Effort: Pulmonary effort is normal. No respiratory distress.  Neurological:     Mental Status: She is alert and oriented to person, place, and time.     Cranial Nerves: No cranial nerve deficit.     Coordination: Coordination normal.     Gait: Gait normal.  Psychiatric:        Mood and Affect: Affect normal. Mood is anxious.        Behavior: Behavior normal. Behavior is cooperative.       Assessment/Plan: 1. Elevated blood pressure reading in office with white coat syndrome, with diagnosis of hypertension Patient confirmed blood pressures have been within normal limits at home  2. GAD (generalized anxiety disorder) Anxiety level is elevated per patient report.  Encourage patient to try the buspirone that was previously prescribed and see if this helps to decrease her anxiety level.   General Counseling: Virginia Warner understanding of the findings of todays visit and agrees with plan of treatment. I have discussed any further diagnostic evaluation that may be needed or ordered today. We also reviewed her medications today. she has been encouraged to call the office with any questions or concerns that should arise related to todays visit.    Counseling:    No orders of the defined types were placed in this encounter.   No orders of the defined types were placed in this encounter.   Return if symptoms worsen or fail to improve.  [place] Controlled Substance Database was reviewed by me for overdose risk score (ORS)  Time spent:20 Minutes Time spent with patient included reviewing progress notes, labs, imaging studies, and discussing plan for follow up.   This patient was seen by Jonetta Osgood,  FNP-C in collaboration with Dr. Clayborn Bigness as a part of collaborative care agreement.  Tammela Bales R. Valetta Fuller, MSN, FNP-C Internal Medicine

## 2022-05-19 ENCOUNTER — Other Ambulatory Visit: Payer: Self-pay | Admitting: Nurse Practitioner

## 2022-05-19 ENCOUNTER — Other Ambulatory Visit: Payer: Self-pay | Admitting: Oncology

## 2022-05-19 DIAGNOSIS — M810 Age-related osteoporosis without current pathological fracture: Secondary | ICD-10-CM

## 2022-05-20 ENCOUNTER — Ambulatory Visit: Payer: Medicare Other | Admitting: Nurse Practitioner

## 2022-05-27 ENCOUNTER — Telehealth: Payer: Self-pay

## 2022-05-27 NOTE — Telephone Encounter (Signed)
Left vm and sent mychart message to confirm 06/03/22 appointment-Toni

## 2022-06-03 ENCOUNTER — Other Ambulatory Visit: Payer: Medicare Other

## 2022-06-03 ENCOUNTER — Ambulatory Visit (INDEPENDENT_AMBULATORY_CARE_PROVIDER_SITE_OTHER): Payer: Medicare Other | Admitting: Nurse Practitioner

## 2022-06-03 ENCOUNTER — Encounter: Payer: Self-pay | Admitting: Nurse Practitioner

## 2022-06-03 VITALS — BP 140/75 | HR 81 | Temp 98.5°F | Resp 16 | Ht 65.0 in | Wt 137.0 lb

## 2022-06-03 DIAGNOSIS — R300 Dysuria: Secondary | ICD-10-CM

## 2022-06-03 DIAGNOSIS — E559 Vitamin D deficiency, unspecified: Secondary | ICD-10-CM

## 2022-06-03 DIAGNOSIS — G5793 Unspecified mononeuropathy of bilateral lower limbs: Secondary | ICD-10-CM

## 2022-06-03 DIAGNOSIS — M810 Age-related osteoporosis without current pathological fracture: Secondary | ICD-10-CM | POA: Diagnosis not present

## 2022-06-03 DIAGNOSIS — F411 Generalized anxiety disorder: Secondary | ICD-10-CM

## 2022-06-03 DIAGNOSIS — J4541 Moderate persistent asthma with (acute) exacerbation: Secondary | ICD-10-CM | POA: Diagnosis not present

## 2022-06-03 DIAGNOSIS — Z0001 Encounter for general adult medical examination with abnormal findings: Secondary | ICD-10-CM | POA: Diagnosis not present

## 2022-06-03 DIAGNOSIS — I10 Essential (primary) hypertension: Secondary | ICD-10-CM

## 2022-06-03 DIAGNOSIS — J309 Allergic rhinitis, unspecified: Secondary | ICD-10-CM

## 2022-06-03 DIAGNOSIS — I700 Atherosclerosis of aorta: Secondary | ICD-10-CM

## 2022-06-03 DIAGNOSIS — R7303 Prediabetes: Secondary | ICD-10-CM | POA: Diagnosis not present

## 2022-06-03 DIAGNOSIS — G8929 Other chronic pain: Secondary | ICD-10-CM

## 2022-06-03 DIAGNOSIS — E782 Mixed hyperlipidemia: Secondary | ICD-10-CM

## 2022-06-03 DIAGNOSIS — M25562 Pain in left knee: Secondary | ICD-10-CM

## 2022-06-03 LAB — POCT GLYCOSYLATED HEMOGLOBIN (HGB A1C): Hemoglobin A1C: 6 % — AB (ref 4.0–5.6)

## 2022-06-03 NOTE — Progress Notes (Signed)
[facility] [place], [place] [address]  Internal MEDICINE  Office Visit Note  Patient Name: Virginia Warner  [id number]  [id number]  Date of Service: 06/03/2022  Chief Complaint  Patient presents with   Medicare Wellness    Discuss bones and tingling/burning in legs    Diabetes   Hyperlipidemia   Hypertension    HPI Dawt presents for an annual well visit and physical exam. She is a well appearing 76 yo female with hypertension, GERd, prediabetes, osteoporosis, and hyperlipidemia and anxiety. She is scheduled for her routine mammogram and BMD screening tomorrow.  Her blood pressure and other vital signs are stable and within normal limits.  Mariposa tends to run on high anxiety she is often worried about different problems or illnesses she might not have but is easily redirected with reassurance and always calls or comes to the clinic if she has any concern or question. She is seeing neurology regarding the neuropathy in her lower extremities.  She reports is still not well controlled yet She is due for routine labs and is also worried about her arthritis and whether it could be rheumatoid so additional labs will be ordered as requested. Her A1c was checked today and it was 6.0 which is prediabetic in stable when compared to her last several A1c's where she ranges around 5.8-6.0.     Current Medication: Outpatient Encounter Medications as of 06/03/2022  Medication Sig Note   acetaminophen (TYLENOL) 500 MG tablet Take 500 mg by mouth daily as needed for moderate pain.     alendronate (FOSAMAX) 70 MG tablet TAKE 1 TABLET EVERY 7 DAYS WITH A FULL GLASS OF WATER ON AN EMPTY STOMACH DO NOT LIE DOWN FOR AT LEAST 30 MIN    aspirin EC 81 MG tablet Take 81 mg by mouth every other day.    diltiazem (CARDIZEM CD) 120 MG 24 hr capsule TAKE 1 CAPSULE BY MOUTH ONCE DAILY    esomeprazole (NEXIUM) 20 MG capsule TAKE 1 CAPSULE EVERY DAY    famotidine (PEPCID) 20 MG tablet Take 1  tablet (20 mg total) by mouth daily.    fluticasone (FLONASE) 50 MCG/ACT nasal spray SPRAY TWICE INTO EACH NOSTRIL ONCE DAILY    Fluticasone-Salmeterol (AIRDUO RESPICLICK 432/76) 147-09 MCG/ACT AEPB 1 inhalation BID about 12 hours apart    gabapentin (NEURONTIN) 100 MG capsule TAKE ONE CAPSULE BY MOUTH EVERY MORNING,ONE CAPSULE IN THE AFTERNOON, AND 3 CAPSULES AT BEDTIME.    latanoprost (XALATAN) 0.005 % ophthalmic solution Place 1 drop into both eyes daily. 06/26/2020: Pt states she uses whenever not always at night   letrozole (FEMARA) 2.5 MG tablet TAKE 1 TABLET BY MOUTH DAILY    lisinopril (ZESTRIL) 20 MG tablet TAKE ONE TABLET BY MOUTH EVERY MORNING AND ONE IN EVENING    Multiple Vitamin (MULTIVITAMIN WITH MINERALS) TABS tablet Take 1 tablet by mouth daily.  06/26/2020: Pt ran out, needs to get more   [DISCONTINUED] busPIRone (BUSPAR) 10 MG tablet Take 1 tablet (10 mg total) by mouth 2 (two) times daily. (Patient not taking: Reported on 04/01/2022)    No facility-administered encounter medications on file as of 06/03/2022.    Surgical History: Past Surgical History:  Procedure Laterality Date   ABDOMINAL HYSTERECTOMY     partial   BREAST BIOPSY Left 06/12/2020   [place] Bx, [place], [facility]   BREAST LUMPECTOMY Left 07/04/2020   [facility] lumpectomy with radiation   BREAST LUMPECTOMY,RADIO FREQ LOCALIZER,AXILLARY SENTINEL LYMPH NODE BIOPSY  Left 07/10/2020   Procedure: BREAST LUMPECTOMY,RADIO FREQ LOCALIZER,AXILLARY SENTINEL LYMPH NODE BIOPSY;  Surgeon: Jules Husbands, MD;  Location: [facility] ORS;  Service: General;  Laterality: Left;   COLONOSCOPY WITH PROPOFOL N/A 10/18/2015   Procedure: COLONOSCOPY WITH PROPOFOL;  Surgeon: Manya Silvas, MD;  Location: [facility] ENDOSCOPY;  Service: Endoscopy;  Laterality: N/A;   COLONOSCOPY WITH PROPOFOL N/A 08/19/2021   Procedure: COLONOSCOPY WITH PROPOFOL;  Surgeon: [name], MD;  Location: [facility] ENDOSCOPY;  Service: Endoscopy;  Laterality: N/A;   INGUINAL  HERNIA REPAIR     age 51's   POLYPECTOMY      Medical History: Past Medical History:  Diagnosis Date   Allergy    Arthritis    Breast cancer ([place])    Diabetes mellitus without complication ([place])    Hyperlipidemia    Hypertension    Personal history of radiation therapy    Seasonal allergies     Family History: Family History  Problem Relation Age of Onset   Cancer Brother    Breast cancer Paternal Aunt     Social History   Socioeconomic History   Marital status: Divorced    Spouse name: Not on file   Number of children: Not on file   Years of education: Not on file   Highest education level: Not on file  Occupational History   Not on file  Tobacco Use   Smoking status: Former    Types: Cigarettes    Quit date: 11/2007    Years since quitting: 14.5   Smokeless tobacco: Never  Vaping Use   Vaping Use: Never used  Substance and Sexual Activity   Alcohol use: Not Currently    Alcohol/week: 0.0 standard drinks of alcohol   Drug use: No   Sexual activity: Not Currently  Other Topics Concern   Not on file  Social History Narrative   Not on file   Social Determinants of Health   Financial Resource Strain: Not on file  Food Insecurity: Not on file  Transportation Needs: Not on file  Physical Activity: Not on file  Stress: Not on file  Social Connections: Not on file  Intimate Partner Violence: Not on file      Review of Systems  Constitutional: Negative.  Negative for activity change, appetite change, chills, diaphoresis, fatigue, fever and unexpected weight change.  HENT:  Positive for postnasal drip. Negative for congestion, ear discharge, ear pain, facial swelling, hearing loss, nosebleeds, rhinorrhea, sinus pressure, sinus pain, sneezing, sore throat, tinnitus, trouble swallowing and voice change.   Eyes:  Negative for photophobia, pain, discharge, redness, itching and visual disturbance.  Respiratory:  Positive for cough and shortness of breath.  Negative for apnea, choking, chest tightness, wheezing and stridor.   Cardiovascular: Negative.  Negative for chest pain, palpitations and leg swelling.  Gastrointestinal: Negative.  Negative for abdominal distention, abdominal pain, anal bleeding, constipation, diarrhea, nausea and rectal pain.  Endocrine: Negative for cold intolerance, heat intolerance, polydipsia, polyphagia and polyuria.  Genitourinary:  Negative for difficulty urinating, flank pain, frequency, genital sores, hematuria, menstrual problem, pelvic pain, urgency, vaginal bleeding, vaginal discharge and vaginal pain.  Musculoskeletal:  Positive for arthralgias, back pain and myalgias. Negative for gait problem, joint swelling and neck pain.  Skin:  Negative for color change, pallor, rash and wound.  Allergic/Immunologic: Negative for environmental allergies, food allergies and immunocompromised state.  Neurological:  Positive for numbness. Negative for dizziness, seizures, syncope, facial asymmetry, speech difficulty, weakness, light-headedness and headaches.  Hematological:  Negative for adenopathy. Does not bruise/bleed easily.  Psychiatric/Behavioral: Negative.  Negative for agitation, behavioral problems, confusion, decreased concentration, dysphoric mood, hallucinations, self-injury, sleep disturbance and suicidal ideas. The patient is not nervous/anxious and is not hyperactive.     Vital Signs: BP 140/75   Pulse 81   Temp 98.5 F (36.9 C)   Resp 16   Ht '5\' 5"'  (1.651 m)   Wt 137 lb (62.1 kg)   SpO2 97%   BMI 22.80 kg/m    Physical Exam Vitals reviewed.  Constitutional:      General: She is awake. She is not in acute distress.    Appearance: Normal appearance. She is well-developed, well-groomed and normal weight. She is not ill-appearing or diaphoretic.  HENT:     Head: Normocephalic and atraumatic.     Right Ear: Tympanic membrane, ear canal and external ear normal. There is no impacted cerumen.     Left Ear:  Tympanic membrane, ear canal and external ear normal. There is no impacted cerumen.     Nose: Nose normal. No congestion or rhinorrhea.     Mouth/Throat:     Lips: Pink.     Mouth: Mucous membranes are moist.     Pharynx: Oropharynx is clear. Uvula midline. No oropharyngeal exudate or posterior oropharyngeal erythema.  Eyes:     General: Lids are normal. Vision grossly intact. Gaze aligned appropriately.     Extraocular Movements: Extraocular movements intact.     Conjunctiva/sclera: Conjunctivae normal.     Pupils: Pupils are equal, round, and reactive to light.     Funduscopic exam:    Right eye: Red reflex present.        Left eye: Red reflex present. Neck:     Thyroid: No thyromegaly.     Vascular: No carotid bruit or JVD.     Trachea: Trachea and phonation normal. No tracheal deviation.  Cardiovascular:     Rate and Rhythm: Normal rate and regular rhythm.     Pulses: Normal pulses.     Heart sounds: Normal heart sounds, S1 normal and S2 normal. No murmur heard.    No friction rub. No gallop.  Pulmonary:     Effort: Pulmonary effort is normal. No accessory muscle usage or respiratory distress.     Breath sounds: Normal breath sounds and air entry. No decreased breath sounds, wheezing, rhonchi or rales.  Chest:     Chest wall: No tenderness.     Comments: Declined clinical breast exam Abdominal:     General: Bowel sounds are normal.     Palpations: Abdomen is soft. There is no shifting dullness, fluid wave, mass or pulsatile mass.     Tenderness: There is no abdominal tenderness.  Musculoskeletal:        General: Normal range of motion.     Cervical back: Normal range of motion and neck supple.     Right lower leg: No edema.     Left lower leg: No edema.  Lymphadenopathy:     Cervical: No cervical adenopathy.  Skin:    General: Skin is warm and dry.     Capillary Refill: Capillary refill takes less than 2 seconds.  Neurological:     Mental Status: She is alert and  oriented to person, place, and time.     Cranial Nerves: No cranial nerve deficit.     Coordination: Coordination normal.     Gait: Gait normal.  Psychiatric:        Mood and Affect:  Mood normal.        Behavior: Behavior normal. Behavior is cooperative.        Thought Content: Thought content normal.        Judgment: Judgment normal.        Assessment/Plan: 1. Encounter for general adult medical examination with abnormal findings Age-appropriate preventive screenings and vaccinations discussed, annual physical exam completed. Routine labs for health maintenance ordered, see below. PHM updated.  - POCT glycosylated hemoglobin (Hb A1C) - CBC with Differential/Platelet - Lipid Profile - TSH + free T4 - CMP14+EGFR - Vitamin D (25 hydroxy)  2. Prediabetes Stable, no significant change, routine labs ordered - POCT glycosylated hemoglobin (Hb A1C) - CBC with Differential/Platelet - Lipid Profile - TSH + free T4 - CMP14+EGFR  3. Essential hypertension Stable and controlled with current medication, routine labs ordered - CBC with Differential/Platelet - TSH + free T4  4. Moderate persistent asthma with acute exacerbation Stable, routine labs ordered - CBC with Differential/Platelet - TSH + free T4  5. Age-related osteoporosis without current pathological fracture Routine labs ordered - CBC with Differential/Platelet - TSH + free T4  6. Aortic atherosclerosis (HCC) Routine labs ordered - CBC with Differential/Platelet - Lipid Profile - TSH + free T4  7. Vitamin D deficiency Routine lab ordered - Vitamin D (25 hydroxy)  8. Neuropathy involving both lower extremities Additional labs ordered - Sed Rate (ESR) - C-reactive protein - ANA Direct w/Reflex if Positive - Rheumatoid Factor - CMP14+EGFR  9. Chronic pain of left knee Additional labs ordered - Sed Rate (ESR) - C-reactive protein - ANA Direct w/Reflex if Positive - Rheumatoid Factor - CMP14+EGFR  10.  Dysuria Routine urinalysis done - UA/M w/rflx Culture, Routine - Microscopic Examination - Urine Culture, Reflex  11. GAD (generalized anxiety disorder) Stable no changes    General Counseling: Braley verbalizes understanding of the findings of todays visit and agrees with plan of treatment. I have discussed any further diagnostic evaluation that may be needed or ordered today. We also reviewed her medications today. she has been encouraged to call the office with any questions or concerns that should arise related to todays visit.    Orders Placed This Encounter  Procedures   Microscopic Examination   Urine Culture, Reflex   UA/M w/rflx Culture, Routine   CBC with Differential/Platelet   Lipid Profile   TSH + free T4   Sed Rate (ESR)   C-reactive protein   ANA Direct w/Reflex if Positive   Rheumatoid Factor   CMP14+EGFR   Vitamin D (25 hydroxy)   POCT glycosylated hemoglobin (Hb A1C)    No orders of the defined types were placed in this encounter.   Return in about 3 months (around 09/03/2022) for F/U, Recheck A1C, Meiko Ives PCP.   Total time spent:30 Minutes Time spent includes review of chart, medications, test results, and follow up plan with the patient.   [place] Controlled Substance Database was reviewed by me.  This patient was seen by Jonetta Osgood, FNP-C in collaboration with Dr. Clayborn Bigness as a part of collaborative care agreement.  Kamorie Aldous R. Valetta Fuller, MSN, FNP-C Internal medicine

## 2022-06-04 ENCOUNTER — Other Ambulatory Visit: Payer: Medicare Other

## 2022-06-04 ENCOUNTER — Ambulatory Visit
Admission: RE | Admit: 2022-06-04 | Discharge: 2022-06-04 | Disposition: A | Discharge status: Home / Self Care | Payer: Medicare Other | Source: Ambulatory Visit | Attending: Surgery | Admitting: Surgery

## 2022-06-04 ENCOUNTER — Ambulatory Visit
Admission: RE | Admit: 2022-06-04 | Discharge: 2022-06-04 | Disposition: A | Discharge status: Home / Self Care | Payer: Medicare Other | Source: Ambulatory Visit | Attending: Oncology | Admitting: Oncology

## 2022-06-04 DIAGNOSIS — Z170 Estrogen receptor positive status [ER+]: Secondary | ICD-10-CM | POA: Insufficient documentation

## 2022-06-04 DIAGNOSIS — C50312 Malignant neoplasm of lower-inner quadrant of left female breast: Secondary | ICD-10-CM | POA: Insufficient documentation

## 2022-06-04 DIAGNOSIS — M810 Age-related osteoporosis without current pathological fracture: Secondary | ICD-10-CM | POA: Diagnosis present

## 2022-06-04 HISTORY — DX: Personal history of irradiation: Z92.3

## 2022-06-05 ENCOUNTER — Other Ambulatory Visit: Payer: Medicare Other

## 2022-06-06 LAB — MICROSCOPIC EXAMINATION
Bacteria, UA: NONE SEEN
Casts: NONE SEEN /lpf
Epithelial Cells (non renal): NONE SEEN /hpf (ref 0–10)
RBC, Urine: NONE SEEN /hpf (ref 0–2)

## 2022-06-06 LAB — UA/M W/RFLX CULTURE, ROUTINE
Bilirubin, UA: NEGATIVE
Glucose, UA: NEGATIVE
Ketones, UA: NEGATIVE
Nitrite, UA: NEGATIVE
Protein,UA: NEGATIVE
RBC, UA: NEGATIVE
Specific Gravity, UA: 1.005 (ref 1.005–1.030)
Urobilinogen, Ur: 0.2 mg/dL (ref 0.2–1.0)
pH, UA: 6 (ref 5.0–7.5)

## 2022-06-06 LAB — URINE CULTURE, REFLEX

## 2022-06-08 ENCOUNTER — Encounter: Payer: Self-pay | Admitting: Nurse Practitioner

## 2022-06-10 ENCOUNTER — Other Ambulatory Visit
Admission: RE | Admit: 2022-06-10 | Discharge: 2022-06-10 | Disposition: A | Discharge status: Home / Self Care | Payer: Medicare Other | Attending: Nurse Practitioner | Admitting: Nurse Practitioner

## 2022-06-10 DIAGNOSIS — G5793 Unspecified mononeuropathy of bilateral lower limbs: Secondary | ICD-10-CM | POA: Insufficient documentation

## 2022-06-10 DIAGNOSIS — M810 Age-related osteoporosis without current pathological fracture: Secondary | ICD-10-CM | POA: Diagnosis not present

## 2022-06-10 DIAGNOSIS — E782 Mixed hyperlipidemia: Secondary | ICD-10-CM | POA: Insufficient documentation

## 2022-06-10 DIAGNOSIS — F411 Generalized anxiety disorder: Secondary | ICD-10-CM | POA: Insufficient documentation

## 2022-06-10 DIAGNOSIS — R7303 Prediabetes: Secondary | ICD-10-CM | POA: Insufficient documentation

## 2022-06-10 DIAGNOSIS — R300 Dysuria: Secondary | ICD-10-CM | POA: Insufficient documentation

## 2022-06-10 DIAGNOSIS — I10 Essential (primary) hypertension: Secondary | ICD-10-CM | POA: Diagnosis not present

## 2022-06-10 DIAGNOSIS — J4541 Moderate persistent asthma with (acute) exacerbation: Secondary | ICD-10-CM | POA: Diagnosis not present

## 2022-06-10 DIAGNOSIS — G8929 Other chronic pain: Secondary | ICD-10-CM | POA: Diagnosis present

## 2022-06-10 DIAGNOSIS — E559 Vitamin D deficiency, unspecified: Secondary | ICD-10-CM | POA: Insufficient documentation

## 2022-06-10 DIAGNOSIS — I700 Atherosclerosis of aorta: Secondary | ICD-10-CM | POA: Insufficient documentation

## 2022-06-10 DIAGNOSIS — M25562 Pain in left knee: Secondary | ICD-10-CM | POA: Insufficient documentation

## 2022-06-10 LAB — LIPID PANEL
Cholesterol: 258 mg/dL — ABNORMAL HIGH (ref 0–200)
HDL: 65 mg/dL (ref >40)
LDL Cholesterol: 173 mg/dL — ABNORMAL HIGH (ref 0–99)
Total CHOL/HDL Ratio: 4 RATIO
Triglycerides: 98 mg/dL (ref <150)
VLDL: 20 mg/dL (ref 0–40)

## 2022-06-10 LAB — CBC WITH DIFFERENTIAL/PLATELET
Abs Immature Granulocytes: 0.01 10*3/uL (ref 0.00–0.07)
Basophils Absolute: 0.1 10*3/uL (ref 0.0–0.1)
Basophils Relative: 1 %
Eosinophils Absolute: 0.2 10*3/uL (ref 0.0–0.5)
Eosinophils Relative: 3 %
HCT: 42.2 % (ref 36.0–46.0)
Hemoglobin: 13.4 g/dL (ref 12.0–15.0)
Immature Granulocytes: 0 %
Lymphocytes Relative: 43 %
Lymphs Abs: 2.5 10*3/uL (ref 0.7–4.0)
MCH: 27.6 pg (ref 26.0–34.0)
MCHC: 31.8 g/dL (ref 30.0–36.0)
MCV: 87 fL (ref 80.0–100.0)
Monocytes Absolute: 0.6 10*3/uL (ref 0.1–1.0)
Monocytes Relative: 10 %
Neutro Abs: 2.5 10*3/uL (ref 1.7–7.7)
Neutrophils Relative %: 43 %
Platelets: 375 10*3/uL (ref 150–400)
RBC: 4.85 MIL/uL (ref 3.87–5.11)
RDW: 14.1 % (ref 11.5–15.5)
WBC: 5.7 10*3/uL (ref 4.0–10.5)
nRBC: 0 % (ref 0.0–0.2)

## 2022-06-10 LAB — T4, FREE: Free T4: 0.88 ng/dL (ref 0.61–1.12)

## 2022-06-10 LAB — COMPREHENSIVE METABOLIC PANEL
ALT: 15 U/L (ref 0–44)
AST: 13 U/L — ABNORMAL LOW (ref 15–41)
Albumin: 4.3 g/dL (ref 3.5–5.0)
Alkaline Phosphatase: 38 U/L (ref 38–126)
Anion gap: 5 (ref 5–15)
BUN: 11 mg/dL (ref 8–23)
CO2: 29 mmol/L (ref 22–32)
Calcium: 9.4 mg/dL (ref 8.9–10.3)
Chloride: 109 mmol/L (ref 98–111)
Creatinine, Ser: 0.55 mg/dL (ref 0.44–1.00)
GFR, Estimated: >60 mL/min (ref >60)
Glucose, Bld: 103 mg/dL — ABNORMAL HIGH (ref 70–99)
Potassium: 4.1 mmol/L (ref 3.5–5.1)
Sodium: 143 mmol/L (ref 135–145)
Total Bilirubin: 0.7 mg/dL (ref 0.3–1.2)
Total Protein: 7.6 g/dL (ref 6.5–8.1)

## 2022-06-10 LAB — VITAMIN D 25 HYDROXY (VIT D DEFICIENCY, FRACTURES): Vit D, 25-Hydroxy: 32.88 ng/mL (ref 30–100)

## 2022-06-10 LAB — C-REACTIVE PROTEIN: CRP: <0.5 mg/dL (ref <1.0)

## 2022-06-10 LAB — SEDIMENTATION RATE: Sed Rate: 20 mm/hr (ref 0–30)

## 2022-06-10 LAB — TSH: TSH: 1.383 u[IU]/mL (ref 0.350–4.500)

## 2022-06-11 LAB — RHEUMATOID FACTOR: Rheumatoid fact SerPl-aCnc: 11.4 IU/mL (ref <14.0)

## 2022-06-11 LAB — ANA: Anti Nuclear Antibody (ANA): NEGATIVE

## 2022-06-16 ENCOUNTER — Encounter: Payer: Self-pay | Admitting: Surgery

## 2022-06-16 ENCOUNTER — Other Ambulatory Visit: Payer: Self-pay

## 2022-06-16 ENCOUNTER — Ambulatory Visit (INDEPENDENT_AMBULATORY_CARE_PROVIDER_SITE_OTHER): Payer: Medicare Other | Admitting: Surgery

## 2022-06-16 ENCOUNTER — Telehealth: Payer: Self-pay

## 2022-06-16 VITALS — BP 162/73 | HR 86 | Temp 98.2°F | Ht 65.0 in | Wt 133.8 lb

## 2022-06-16 DIAGNOSIS — Z170 Estrogen receptor positive status [ER+]: Secondary | ICD-10-CM

## 2022-06-16 DIAGNOSIS — C50312 Malignant neoplasm of lower-inner quadrant of left female breast: Secondary | ICD-10-CM | POA: Diagnosis not present

## 2022-06-16 NOTE — Telephone Encounter (Signed)
Lvm to schedule follow up with [email or web address]

## 2022-06-16 NOTE — Patient Instructions (Addendum)
We will contact you next year to schedule your mammogram and breast exam for June 2024.    Please call with any questions or concerns.  Breast Self-Awareness Breast self-awareness means being familiar with how your breasts look and feel. It involves checking your breasts regularly and telling your health care provider about any changes. Practicing breast self-awareness helps to maintain breast health. Sometimes, changes are not harmful (are benign). Other times, a change in your breasts can be a sign of a serious medical problem. Being familiar with the look and feel of your breasts can help you catch a breast problem while it is still small and can be treated. You should do breast self-exams even if you have breast implants. What you need: A mirror. A well-lit room. A pillow or other soft object. How to do a breast self-exam A breast self-exam is one way to learn what is normal for your breasts and whether your breasts are changing. To do a breast self-exam: Look for changes  Remove all the clothing above your waist. Stand in front of a mirror in a room with good lighting. Put your hands down at your sides. Compare your breasts in the mirror. Look for differences between them (asymmetry), such as: Differences in shape. Differences in size. Puckers, dips, and bumps in one breast and not the other. Look at each breast for changes in the skin, such as: Redness. Scaly areas. Skin thickening. Dimpling. Open sores (ulcers). Look for changes in your nipples, such as: Discharge. Bleeding. Dimpling. Redness. A nipple that looks pushed in (retracted), or that has changed position. Feel for changes Carefully feel your breasts for lumps and changes. It is best to do this self-exam while lying down. Follow these steps to feel each breast: Place a pillow under the shoulder of one side of your body. Place the arm of that side of your body behind your head. Feel the breast of that side of  your body using the hand of the opposite arm. To do this: Start in the nipple area and use the pads of your three middle fingers to make -inch (2 cm) overlapping circles. Use light, medium, and then firm pressure as you feel your breast, gently covering the entire breast area and armpit. Continue the overlapping circles, moving downward over the breast until you feel your ribs below your breast. Then, make circles with your fingers going upward until you reach your collarbone. Next, make circles by moving outward across your breast and into your armpit area. Squeeze the nipple. Check for discharge and lumps. Repeat steps 1-7 to check your other breast. Sit or stand in the tub or shower. With soapy water on your skin, feel each breast the same way you did when you were lying down. Write down what you find Writing down what you find can help you remember what to discuss with your health care provider. Write down: What is normal for each breast. Any changes that you find in each breast. These include: The kind of changes you find. Any pain or tenderness. Size and location of any lumps. Where you are in your menstrual cycle, if you are still getting your menstrual period (menstruating). General tips If you are breastfeeding, the best time to examine your breasts is after a feeding or after using a breast pump. If you menstruate, the best time to examine your breasts is 5-7 days after your menstrual period. Breasts are generally lumpier during menstrual periods, and it may be more difficult  to notice changes. With time and practice, you will become more familiar with the differences in your breasts and more comfortable with the exam. Contact a health care provider if: You see a change in the shape or size of your breasts or nipples. You see a change in the skin of your breast or nipples, such as a reddened or scaly area. You have unusual discharge from your nipples. You find a new lump or thick  area. You have breast pain. You have any concerns about your breast health. Summary Breast self-awareness includes looking for physical changes in your breasts and feeling for any changes within your breasts. Breast self-awareness should be done in front of a mirror in a well-lit room. If you menstruate, the best time to examine your breasts is 5-7 days after your menstrual period. Tell your health care provider about any changes you notice in your breasts. Changes include changes in size, changes on the skin, pain or tenderness, or unusual fluid from your nipples. This information is not intended to replace advice given to you by your health care provider. Make sure you discuss any questions you have with your health care provider. Document Revised: 11/05/2021 Document Reviewed: 10/17/2021 Elsevier Patient Education  [place].

## 2022-06-17 ENCOUNTER — Ambulatory Visit (INDEPENDENT_AMBULATORY_CARE_PROVIDER_SITE_OTHER): Payer: Medicare Other | Admitting: Nurse Practitioner

## 2022-06-17 ENCOUNTER — Encounter: Payer: Self-pay | Admitting: Nurse Practitioner

## 2022-06-17 VITALS — BP 135/74 | HR 85 | Temp 98.2°F | Resp 16 | Ht 65.0 in | Wt 135.4 lb

## 2022-06-17 DIAGNOSIS — R7303 Prediabetes: Secondary | ICD-10-CM

## 2022-06-17 DIAGNOSIS — I700 Atherosclerosis of aorta: Secondary | ICD-10-CM | POA: Diagnosis not present

## 2022-06-17 DIAGNOSIS — K219 Gastro-esophageal reflux disease without esophagitis: Secondary | ICD-10-CM | POA: Diagnosis not present

## 2022-06-17 DIAGNOSIS — F411 Generalized anxiety disorder: Secondary | ICD-10-CM

## 2022-06-17 DIAGNOSIS — E559 Vitamin D deficiency, unspecified: Secondary | ICD-10-CM

## 2022-06-17 NOTE — Progress Notes (Signed)
[facility] [place], [place] [address]  Internal MEDICINE  Office Visit Note  Patient Name: [place] Warner  [id number]  [id number]  Date of Service: 06/17/2022  Chief Complaint  Patient presents with   Follow-up   Results   Diabetes   Hyperlipidemia   Hypertension    HPI Collyn presents for a follow up visit for hypertension and lab results. She was so worried over her labs that she did not sleep well last night.  --metabolic panel grossly normal --lipid panel is abnormal with LDL 173 and total cholesterol 258. VLDL 20, HDL 65, triglycerides 98 and chol/hdl ratio is 4.0 --CBC, thyroid levels, sed rate, CRP are all normal.  --ANA and RF are negative --vitamin D is low normal at 32.88 Her acid reflux has been flaring up, reminded patient to take the esomeprazole and famotidine as prescribed.  --dexa scan was done earlier this month, with no significant change, still osteopenia. No osteoporosis.  --mammogram done this month, BI-RADS category 2: benign.     Current Medication: Outpatient Encounter Medications as of 06/17/2022  Medication Sig Note   acetaminophen (TYLENOL) 500 MG tablet Take 500 mg by mouth daily as needed for moderate pain.     aspirin EC 81 MG tablet Take 81 mg by mouth every other day.    esomeprazole (NEXIUM) 20 MG capsule TAKE 1 CAPSULE EVERY DAY    famotidine (PEPCID) 20 MG tablet Take 1 tablet (20 mg total) by mouth daily.    Fluticasone-Salmeterol (AIRDUO RESPICLICK 818/56) 314-97 MCG/ACT AEPB 1 inhalation BID about 12 hours apart    gabapentin (NEURONTIN) 100 MG capsule TAKE ONE CAPSULE BY MOUTH EVERY MORNING,ONE CAPSULE IN THE AFTERNOON, AND 3 CAPSULES AT BEDTIME.    latanoprost (XALATAN) 0.005 % ophthalmic solution Place 1 drop into both eyes daily. 06/26/2020: Pt states she uses whenever not always at night   lisinopril (ZESTRIL) 20 MG tablet TAKE ONE TABLET BY MOUTH EVERY MORNING AND ONE IN EVENING    Multiple Vitamin  (MULTIVITAMIN WITH MINERALS) TABS tablet Take 1 tablet by mouth daily.  06/26/2020: Pt ran out, needs to get more   [DISCONTINUED] alendronate (FOSAMAX) 70 MG tablet TAKE 1 TABLET EVERY 7 DAYS WITH A FULL GLASS OF WATER ON AN EMPTY STOMACH DO NOT LIE DOWN FOR AT LEAST 30 MIN    [DISCONTINUED] diltiazem (CARDIZEM CD) 120 MG 24 hr capsule TAKE 1 CAPSULE BY MOUTH ONCE DAILY    [DISCONTINUED] letrozole (FEMARA) 2.5 MG tablet TAKE 1 TABLET BY MOUTH DAILY    No facility-administered encounter medications on file as of 06/17/2022.    Surgical History: Past Surgical History:  Procedure Laterality Date   ABDOMINAL HYSTERECTOMY     partial   BREAST BIOPSY Left 06/12/2020   [place] Bx, Ribbon Clip, [facility]   BREAST LUMPECTOMY Left 07/04/2020   [facility] lumpectomy with radiation   BREAST LUMPECTOMY,RADIO FREQ LOCALIZER,AXILLARY SENTINEL LYMPH NODE BIOPSY Left 07/10/2020   Procedure: BREAST LUMPECTOMY,RADIO FREQ LOCALIZER,AXILLARY SENTINEL LYMPH NODE BIOPSY;  Surgeon: Jules Husbands, MD;  Location: [facility] ORS;  Service: General;  Laterality: Left;   COLONOSCOPY WITH PROPOFOL N/A 10/18/2015   Procedure: COLONOSCOPY WITH PROPOFOL;  Surgeon: Manya Silvas, MD;  Location: [facility] ENDOSCOPY;  Service: Endoscopy;  Laterality: N/A;   COLONOSCOPY WITH PROPOFOL N/A 08/19/2021   Procedure: COLONOSCOPY WITH PROPOFOL;  Surgeon: [name], MD;  Location: [facility] ENDOSCOPY;  Service: Endoscopy;  Laterality: N/A;   INGUINAL HERNIA REPAIR     age  20's   POLYPECTOMY      Medical History: Past Medical History:  Diagnosis Date   Allergy    Arthritis    Breast cancer ([place])    Diabetes mellitus without complication ([place])    Hyperlipidemia    Hypertension    Personal history of radiation therapy    Seasonal allergies     Family History: Family History  Problem Relation Age of Onset   Cancer Brother    Breast cancer Paternal Aunt     Social History   Socioeconomic History   Marital status: Divorced    Spouse  name: Not on file   Number of children: Not on file   Years of education: Not on file   Highest education level: Not on file  Occupational History   Not on file  Tobacco Use   Smoking status: Former    Types: Cigarettes    Quit date: 11/2007    Years since quitting: 14.7   Smokeless tobacco: Never  Vaping Use   Vaping Use: Never used  Substance and Sexual Activity   Alcohol use: Not Currently    Alcohol/week: 0.0 standard drinks of alcohol   Drug use: No   Sexual activity: Not Currently  Other Topics Concern   Not on file  Social History Narrative   Not on file   Social Determinants of Health   Financial Resource Strain: Not on file  Food Insecurity: Not on file  Transportation Needs: Not on file  Physical Activity: Not on file  Stress: Not on file  Social Connections: Not on file  Intimate Partner Violence: Not on file      Review of Systems  Constitutional:  Negative for fatigue and fever.  HENT:  Negative for congestion, mouth sores and postnasal drip.   Respiratory:  Negative for cough.   Cardiovascular:  Negative for chest pain.  Genitourinary:  Negative for flank pain.  Neurological:  Negative for headaches.  Psychiatric/Behavioral:  The patient is nervous/anxious.     Vital Signs: BP 135/74   Pulse 85   Temp 98.2 F (36.8 C)   Resp 16   Ht '5\' 5"'$  (1.651 m)   Wt 135 lb 6.4 oz (61.4 kg)   SpO2 97%   BMI 22.53 kg/m    Physical Exam Vitals reviewed.  Constitutional:      General: She is not in acute distress.    Appearance: Normal appearance. She is normal weight. She is not ill-appearing.  HENT:     Head: Normocephalic and atraumatic.  Eyes:     Pupils: Pupils are equal, round, and reactive to light.  Cardiovascular:     Rate and Rhythm: Normal rate and regular rhythm.  Pulmonary:     Effort: Pulmonary effort is normal. No respiratory distress.  Neurological:     Mental Status: She is alert and oriented to person, place, and time.      Cranial Nerves: No cranial nerve deficit.     Coordination: Coordination normal.     Gait: Gait normal.  Psychiatric:        Mood and Affect: Affect normal. Mood is anxious.        Behavior: Behavior normal. Behavior is cooperative.        Assessment/Plan: 1. Prediabetes Last a1c was 6.0, will repeat in 2 more months.  2. Aortic atherosclerosis ([place]) Need to readdress cholesterol-lowering medications at next office visit.   3. Gastroesophageal reflux disease without esophagitis Continue medications as prescribed, avoid foods that trigger  acid reflux  4. Vitamin D deficiency Continue OTC supplement, level is low normal  5. GAD (generalized anxiety disorder) Always having high level anxiety, encouraged patient to try thew buspirone that was prescribed to her again, she is nervous about trying the medication so she has not tried it yet.   General Counseling: duchess armendarez understanding of the findings of todays visit and agrees with plan of treatment. I have discussed any further diagnostic evaluation that may be needed or ordered today. We also reviewed her medications today. she has been encouraged to call the office with any questions or concerns that should arise related to todays visit.    No orders of the defined types were placed in this encounter.   No orders of the defined types were placed in this encounter.   Return in about 2 months (around 08/17/2022) for F/U, Recheck A1C, Ciera Beckum PCP.   Total time spent:30 Minutes Time spent includes review of chart, medications, test results, and follow up plan with the patient.   [place] Controlled Substance Database was reviewed by me.  This patient was seen by Jonetta Osgood, FNP-C in collaboration with Dr. Clayborn Bigness as a part of collaborative care agreement.   Lavelle Akel R. Valetta Fuller, MSN, FNP-C Internal medicine

## 2022-06-20 NOTE — Progress Notes (Signed)
[place] Warner is an 76 y.o. female.        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  ER strongly positive greater than 90%, PR 1 to 10% positive and HER-2 negative  Received adjuvant radiation.  She had a recent mammogram that have personally reviewed showing no evidence of any suspicious lesions.  She is tolerating letrozole complain of some bone pain.   No fevers no chills no new lumps. She is tolerating letrozole. Has no major complaints regarding her breast.  Lumps or bumps.  Denies any discharge.       Past Medical History:  Diagnosis Date   Allergy     Arthritis     Breast cancer (HCC)     Hyperlipidemia     Hypertension     Seasonal allergies             Past Surgical History:  Procedure Laterality Date   ABDOMINAL HYSTERECTOMY        partial   BREAST BIOPSY Left 06/12/2020    [place] Bx, Ribbon Clip, path pending    BREAST LUMPECTOMY Left 07/04/2020    [facility] lumpectomy with radiation   BREAST LUMPECTOMY,RADIO FREQ LOCALIZER,AXILLARY SENTINEL LYMPH NODE BIOPSY Left 07/10/2020    Procedure: BREAST LUMPECTOMY,RADIO FREQ LOCALIZER,AXILLARY SENTINEL LYMPH NODE BIOPSY;  Surgeon: Leafy Ro, MD;  Location: [facility] ORS;  Service: General;  Laterality: Left;   COLONOSCOPY WITH PROPOFOL N/A 10/18/2015    Procedure: COLONOSCOPY WITH PROPOFOL;  Surgeon: Scot Jun, MD;  Location: [facility] ENDOSCOPY;  Service: Endoscopy;  Laterality: N/A;   INGUINAL HERNIA REPAIR        age 84's   POLYPECTOMY               Family History  Problem Relation Age of Onset   Cancer Brother     Breast cancer Paternal Grandmother        Social History:  reports that she quit smoking about 13 years ago. She has never used smokeless tobacco. She reports previous alcohol use. She reports that she does not use  drugs.   Allergies:       Allergies  Allergen Reactions   Aspirin Nausea And Vomiting      Cannot tolerate unless enteric coated   Atorvastatin Other (See Comments)      Bone pain      Medications reviewed.       ROS Full ROS performed and is otherwise negative other than what is stated in HPI   Physical Exam Vitals and nursing note reviewed. Exam conducted with a chaperone present.  Constitutional:      General: She is not in acute distress.    Appearance: Normal appearance. She is normal weight.  Eyes:     General: No scleral icterus.       Right eye: No discharge.        Left eye: No discharge.  Cardiovascular:     Rate and Rhythm: Normal rate and regular rhythm.     Heart sounds: No murmur heard. Pulmonary:     Effort: Pulmonary effort is normal. No respiratory distress.     Breath sounds: Normal breath sounds. No stridor.     Comments: BREAST: Left lumpectomy scar and radiation changes.  No evidence of infection no evidence of  new lesions.  There is no evidence of lymphedema and no evidence of lymphadenopathy.  Right breast and right axilla is completely normal. Musculoskeletal:        General: No swelling or tenderness. Normal range of motion.     Cervical back: Normal range of motion and neck supple. No rigidity or tenderness.  Skin:    General: Skin is warm and dry.  Neurological:     General: No focal deficit present.     Mental Status: She is alert and oriented to person, place, and time.  Psychiatric:        Mood and Affect: Mood normal.        Behavior: Behavior normal.        Thought Content: Thought content normal.        Judgment: Judgment normal.        Assessment/Plan:   1. Malignant neoplasm of lower-inner quadrant of left breast in female, estrogen receptor positive (HCC) No evidence of recurrence or any suspicious lesions that merit any further work-up.  We will continue yearly mammograms with physical exams.   Please note I spent greater  than 30 minutes in this encounter personally reviewing studies, medical, counseling the patient and performing appropriate documentation   Sterling Big, MD [facility] General Surgeon

## 2022-06-23 ENCOUNTER — Ambulatory Visit (INDEPENDENT_AMBULATORY_CARE_PROVIDER_SITE_OTHER): Payer: Medicare Other | Admitting: Nurse Practitioner

## 2022-06-23 ENCOUNTER — Encounter: Payer: Self-pay | Admitting: Nurse Practitioner

## 2022-06-23 ENCOUNTER — Telehealth: Payer: Self-pay

## 2022-06-23 VITALS — BP 130/80 | HR 95 | Temp 98.5°F | Resp 16 | Ht 65.0 in | Wt 137.0 lb

## 2022-06-23 DIAGNOSIS — J330 Polyp of nasal cavity: Secondary | ICD-10-CM

## 2022-06-23 DIAGNOSIS — R040 Epistaxis: Secondary | ICD-10-CM

## 2022-06-23 NOTE — Progress Notes (Signed)
[facility] [address] [place], [place] [address]  Internal MEDICINE  Office Visit Note  Patient Name: [place] Warner  [id number]  [id number]  Date of Service: 06/23/2022  Chief Complaint  Patient presents with   Acute Visit    Headache yesterday, blood clot in mouth      HPI [place] Warner presents for an acute sick visit for coughing up blood per patient report.  She has had nosebleeds in the past and these have been exacerbated by using nasal steroids such as Flonase.  Patient had recently started using Flonase again and then coughed up a small dark blood clot this morning and walked in for an acute visit.  She denies any persistent cough, shortness of breath, chest tightness or wheezing today.  She reports that after she coughed up the small blood clot that she rinsed her mouth with salt water and gargled and then spit out a little bit of blood-tinged saliva. She also had a headache that was localized to her forehead above her right eye and then it resolved on its own but she was wondering if it was more likely sinus pressure.     Current Medication:  Outpatient Encounter Medications as of 06/23/2022  Medication Sig Note   acetaminophen (TYLENOL) 500 MG tablet Take 500 mg by mouth daily as needed for moderate pain.     alendronate (FOSAMAX) 70 MG tablet TAKE 1 TABLET EVERY 7 DAYS WITH A FULL GLASS OF WATER ON AN EMPTY STOMACH DO NOT LIE DOWN FOR AT LEAST 30 MIN    aspirin EC 81 MG tablet Take 81 mg by mouth every other day.    diltiazem (CARDIZEM CD) 120 MG 24 hr capsule TAKE 1 CAPSULE BY MOUTH ONCE DAILY    esomeprazole (NEXIUM) 20 MG capsule TAKE 1 CAPSULE EVERY DAY    famotidine (PEPCID) 20 MG tablet Take 1 tablet (20 mg total) by mouth daily.    Fluticasone-Salmeterol (AIRDUO RESPICLICK 113/14) 113-14 MCG/ACT AEPB 1 inhalation BID about 12 hours apart    gabapentin (NEURONTIN) 100 MG capsule TAKE ONE CAPSULE BY MOUTH EVERY MORNING,ONE CAPSULE IN THE AFTERNOON, AND 3  CAPSULES AT BEDTIME.    latanoprost (XALATAN) 0.005 % ophthalmic solution Place 1 drop into both eyes daily. 06/26/2020: Pt states she uses whenever not always at night   letrozole (FEMARA) 2.5 MG tablet TAKE 1 TABLET BY MOUTH DAILY    lisinopril (ZESTRIL) 20 MG tablet TAKE ONE TABLET BY MOUTH EVERY MORNING AND ONE IN EVENING    Multiple Vitamin (MULTIVITAMIN WITH MINERALS) TABS tablet Take 1 tablet by mouth daily.  06/26/2020: Pt ran out, needs to get more   No facility-administered encounter medications on file as of 06/23/2022.      Medical History: Past Medical History:  Diagnosis Date   Allergy    Arthritis    Breast cancer (HCC)    Diabetes mellitus without complication (HCC)    Hyperlipidemia    Hypertension    Personal history of radiation therapy    Seasonal allergies      Vital Signs: BP 130/80   Pulse 95   Temp 98.5 F (36.9 C)   Resp 16   Ht 5\' 5"  (1.651 m)   Wt 137 lb (62.1 kg)   SpO2 98%   BMI 22.80 kg/m    Review of Systems  Constitutional:  Negative for chills, fatigue and fever.  HENT:  Positive for nosebleeds, postnasal drip, rhinorrhea and sinus pressure. Negative for congestion.  Respiratory:  Positive for cough (mild). Negative for chest tightness, shortness of breath and wheezing.   Cardiovascular: Negative.  Negative for chest pain and palpitations.  Neurological:  Positive for headaches.    Physical Exam Vitals reviewed.  Constitutional:      General: She is not in acute distress.    Appearance: Normal appearance. She is normal weight. She is not ill-appearing.  HENT:     Head: Normocephalic and atraumatic.     Nose: Nasal tenderness and mucosal edema present. No congestion or rhinorrhea.     Right Nostril: No epistaxis.     Left Nostril: Occlusion (possible nasal polyp) present. No epistaxis.     Right Turbinates: Swollen.     Left Turbinates: Enlarged and swollen.     Right Sinus: No maxillary sinus tenderness or frontal sinus  tenderness.     Left Sinus: No maxillary sinus tenderness or frontal sinus tenderness.     Mouth/Throat:     Mouth: Mucous membranes are moist.     Pharynx: Oropharynx is clear.  Eyes:     Pupils: Pupils are equal, round, and reactive to light.  Cardiovascular:     Rate and Rhythm: Normal rate and regular rhythm.  Pulmonary:     Effort: Pulmonary effort is normal. No respiratory distress.     Breath sounds: Normal breath sounds. No wheezing.  Neurological:     Mental Status: She is alert.  Psychiatric:        Mood and Affect: Mood normal.        Behavior: Behavior normal.       Assessment/Plan: 1. Nasal cavity polyp Possible nasal polyp on exam of left nostril and nasal cavity. Refer to ENT - Ambulatory referral to ENT  2. Left-sided epistaxis Stop using nasal steroids until instructed otherwise; use saline nasal spray daily; use afrin nasal spray as discussed when actively having a nosebleed. Referred to ENT for further evaluation - Ambulatory referral to ENT   General Counseling: [place] Warner understanding of the findings of todays visit and agrees with plan of treatment. I have discussed any further diagnostic evaluation that may be needed or ordered today. We also reviewed her medications today. she has been encouraged to call the office with any questions or concerns that should arise related to todays visit.    Counseling:    Orders Placed This Encounter  Procedures   Ambulatory referral to ENT    No orders of the defined types were placed in this encounter.   Return if symptoms worsen or fail to improve.  [place] Controlled Substance Database was reviewed by me for overdose risk score (ORS)  Time spent:20 Minutes Time spent with patient included reviewing progress notes, labs, imaging studies, and discussing plan for follow up.   This patient was seen by Sallyanne Kuster, FNP-C in collaboration with Dr. Beverely Risen as a part of collaborative care  agreement.  Virginia Millar R. Tedd Sias, MSN, FNP-C Internal Medicine

## 2022-06-24 NOTE — Telephone Encounter (Signed)
Otolaryngology appt 08/01/22 @ 2:00 w/  Ent-Toni

## 2022-07-06 ENCOUNTER — Encounter: Payer: Self-pay | Admitting: Nurse Practitioner

## 2022-07-14 ENCOUNTER — Other Ambulatory Visit: Payer: Self-pay | Admitting: Oncology

## 2022-07-17 ENCOUNTER — Other Ambulatory Visit: Payer: Self-pay | Admitting: Nurse Practitioner

## 2022-07-20 ENCOUNTER — Encounter: Payer: Self-pay | Admitting: Emergency Medicine

## 2022-07-20 ENCOUNTER — Emergency Department
Admission: EM | Admit: 2022-07-20 | Discharge: 2022-07-20 | Disposition: A | Discharge status: Home / Self Care | Payer: Medicare Other | Attending: Emergency Medicine | Admitting: Emergency Medicine

## 2022-07-20 ENCOUNTER — Other Ambulatory Visit: Payer: Self-pay

## 2022-07-20 DIAGNOSIS — R040 Epistaxis: Secondary | ICD-10-CM | POA: Diagnosis present

## 2022-07-20 DIAGNOSIS — I10 Essential (primary) hypertension: Secondary | ICD-10-CM | POA: Insufficient documentation

## 2022-07-20 DIAGNOSIS — E876 Hypokalemia: Secondary | ICD-10-CM | POA: Diagnosis not present

## 2022-07-20 LAB — CBC WITH DIFFERENTIAL/PLATELET
Abs Immature Granulocytes: 0.01 10*3/uL (ref 0.00–0.07)
Basophils Absolute: 0.1 10*3/uL (ref 0.0–0.1)
Basophils Relative: 1 %
Eosinophils Absolute: 0.2 10*3/uL (ref 0.0–0.5)
Eosinophils Relative: 4 %
HCT: 42.3 % (ref 36.0–46.0)
Hemoglobin: 13.2 g/dL (ref 12.0–15.0)
Immature Granulocytes: 0 %
Lymphocytes Relative: 48 %
Lymphs Abs: 2.6 10*3/uL (ref 0.7–4.0)
MCH: 27.3 pg (ref 26.0–34.0)
MCHC: 31.2 g/dL (ref 30.0–36.0)
MCV: 87.4 fL (ref 80.0–100.0)
Monocytes Absolute: 0.5 10*3/uL (ref 0.1–1.0)
Monocytes Relative: 9 %
Neutro Abs: 2.1 10*3/uL (ref 1.7–7.7)
Neutrophils Relative %: 38 %
Platelets: 389 10*3/uL (ref 150–400)
RBC: 4.84 MIL/uL (ref 3.87–5.11)
RDW: 13.5 % (ref 11.5–15.5)
WBC: 5.4 10*3/uL (ref 4.0–10.5)
nRBC: 0 % (ref 0.0–0.2)

## 2022-07-20 LAB — BASIC METABOLIC PANEL
Anion gap: 9 (ref 5–15)
BUN: 10 mg/dL (ref 8–23)
CO2: 27 mmol/L (ref 22–32)
Calcium: 9.3 mg/dL (ref 8.9–10.3)
Chloride: 104 mmol/L (ref 98–111)
Creatinine, Ser: 0.64 mg/dL (ref 0.44–1.00)
GFR, Estimated: >60 mL/min (ref >60)
Glucose, Bld: 126 mg/dL — ABNORMAL HIGH (ref 70–99)
Potassium: 3.3 mmol/L — ABNORMAL LOW (ref 3.5–5.1)
Sodium: 140 mmol/L (ref 135–145)

## 2022-07-20 MED ORDER — OXYMETAZOLINE HCL 0.05 % NA SOLN
1.0000 | Freq: Once | NASAL | Status: AC
  Administered 2022-07-20: 1 via NASAL
  Filled 2022-07-20: qty 30

## 2022-07-20 MED ORDER — TRANEXAMIC ACID FOR EPISTAXIS
500.0000 mg | Freq: Once | TOPICAL | Status: AC
  Administered 2022-07-20: 500 mg via TOPICAL
  Filled 2022-07-20: qty 10

## 2022-07-20 NOTE — ED Notes (Signed)
No bleeding noted at present

## 2022-07-20 NOTE — Discharge Instructions (Signed)
Please call your ENT doctor and let them know that this happen so they can see you again and see if there is anything else that needs to be cauterized.  If the bleeding restarts place 1 spray of Afrin into the nose and apply pressure for 10 minutes.  If it does not stop then return to the ER and we can consider packing at that time  Do not overuse the Afrin more than 2-3 times over the course of 3 days as it can cause rebound blood pressure issues.  Make sure to take your blood pressure medicine when you get home

## 2022-07-20 NOTE — ED Notes (Signed)
See triage note  Presents with nosebleed  States she was just seen on Friday for same    States she had her nose cauterized

## 2022-07-20 NOTE — ED Provider Notes (Signed)
[facility] Provider Note    Event Date/Time   First MD Initiated Contact with Patient 07/20/22 [phone]     (approximate)   History   Epistaxis   HPI  [place]ia Warner is a 76 y.o. female with hypertension and history of epistaxis who comes in with concern for recurrent epistaxis.  I reviewed patient's pulmonary note from 06/23/2022 where she was referred to ENT for the bleeding.   Patient reports that she just underwent cauterization of her nose on Friday.  She is not sure what side it was on.  She reports that she started having the nosebleed start again overnight.  She reports it is mostly controlled at this time and that she is not taking her blood pressure medicines yet.     Physical Exam   Triage Vital Signs: ED Triage Vitals  Enc Vitals Group     BP 07/20/22 0719 (!) 155/105     Pulse Rate 07/20/22 0719 (!) 104     Resp 07/20/22 0719 18     Temp 07/20/22 0719 97.6 F (36.4 C)     Temp Source 07/20/22 0719 Oral     SpO2 07/20/22 0719 98 %     Weight 07/20/22 0714 127 lb (57.6 kg)     Height 07/20/22 0714 '5\' 5"'$  (1.651 m)     Head Circumference --      Peak Flow --      Pain Score 07/20/22 0714 0     Pain Loc --      Pain Edu? --      Excl. in [place]? --     Most recent vital signs: Vitals:   07/20/22 0719  BP: (!) 155/105  Pulse: (!) 104  Resp: 18  Temp: 97.6 F (36.4 C)  SpO2: 98%     General: Awake, no distress.  CV:  Good peripheral perfusion.  Resp:  Normal effort.  Abd:  No distention.  Other:  Right nostril normal.  Left nostril with some dried blood but no significant active bleeding   ED Results / Procedures / Treatments   Labs (all labs ordered are listed, but only abnormal results are displayed) Labs Reviewed  CBC WITH DIFFERENTIAL/PLATELET  BASIC METABOLIC PANEL      PROCEDURES:  Critical Care performed: No  .Epistaxis Management  Date/Time: 07/20/2022 10:21 AM  Performed by: Vanessa [place], MD Authorized  by: Vanessa [place], MD   Consent:    Consent obtained:  Verbal   Consent given by:  Patient   Risks, benefits, and alternatives were discussed: yes     Alternatives discussed:  No treatment and alternative treatment Anesthesia:    Anesthesia method:  None Procedure details:    Treatment complexity:  Limited Post-procedure details:    Assessment:  Bleeding decreased Comments:     TXA and Afrin soaked gauze was applied to the left nostril with compression    MEDICATIONS ORDERED IN ED: Medications  oxymetazoline (AFRIN) 0.05 % nasal spray 1 spray (has no administration in time range)  tranexamic acid (CYKLOKAPRON) 1000 MG/10ML topical solution 500 mg (has no administration in time range)     IMPRESSION / MDM / [place] / ED COURSE  I reviewed the triage vital signs and the nursing notes.   Patient's presentation is most consistent with acute, uncomplicated illness.   Differential includes nosebleed.  Patient reports that she had blood work drawn out front therefore will chest for platelets, hemoglobin.  BMP shows  slightly low potassium. CBC reassuring with no anemia  Patient had some gauze with TXA and Afrin that was placed in the left nostril due to a little bit of using.  This was removed and patient was watched for another hour without any recurrent bleeding.  We discussed packing but at this time patient would like to avoid.  Patient given Afrin to use at home sparingly because she understands that it can increase blood pressure and she will take her blood pressure medications when she gets home.  She understands that if the bleeding restarts after using Afrin and applying pressure for 10 minutes that she needs to return to the ER for packing.  Otherwise she can follow-up with ENT and call their office tomorrow.  Patient expressed understanding felt comfortable with discharge home  Considered admission but given that hemoglobin stable and epistaxis has resolved patient  is safe for discharge     FINAL CLINICAL IMPRESSION(S) / ED DIAGNOSES   Final diagnoses:  Epistaxis     Rx / DC Orders   ED Discharge Orders     None        Note:  This document was prepared using Dragon voice recognition software and may include unintentional dictation errors.   Vanessa Harm[place], MD 07/20/22 1023

## 2022-07-20 NOTE — ED Triage Notes (Addendum)
First Nurse Note;  Pt via EMS from home. Pt c/o epistaxis with hx of same this AM. Saw a doctor to [place] her nose on Friday. Pt continues to pull clot out. Pt has hx of HTN, and has not taken medications this AM. Denies blood thinners. Ambulatory on scene. Pt is A&Ox4 and NAD Bleeding Controlled at this time. Pt has not taken her HTN medications this AM.   170/110 90 HR  99% on RA 20 RR

## 2022-08-06 ENCOUNTER — Other Ambulatory Visit: Payer: Self-pay | Admitting: Internal Medicine

## 2022-08-06 DIAGNOSIS — M810 Age-related osteoporosis without current pathological fracture: Secondary | ICD-10-CM

## 2022-08-08 ENCOUNTER — Encounter: Payer: Self-pay | Admitting: Nurse Practitioner

## 2022-08-22 ENCOUNTER — Ambulatory Visit: Payer: Medicare Other | Admitting: Radiation Oncology

## 2022-09-02 ENCOUNTER — Other Ambulatory Visit: Payer: Self-pay | Admitting: Oncology

## 2022-09-03 ENCOUNTER — Other Ambulatory Visit: Payer: Self-pay | Admitting: Nurse Practitioner

## 2022-09-03 DIAGNOSIS — G5793 Unspecified mononeuropathy of bilateral lower limbs: Secondary | ICD-10-CM

## 2022-09-15 ENCOUNTER — Other Ambulatory Visit: Payer: Self-pay | Admitting: *Deleted

## 2022-09-15 DIAGNOSIS — N200 Calculus of kidney: Secondary | ICD-10-CM

## 2022-09-16 ENCOUNTER — Ambulatory Visit: Payer: Medicare Other | Admitting: Physician Assistant

## 2022-09-22 ENCOUNTER — Ambulatory Visit: Payer: Medicare Other | Admitting: Radiation Oncology

## 2022-09-29 ENCOUNTER — Ambulatory Visit
Admission: RE | Admit: 2022-09-29 | Discharge: 2022-09-29 | Disposition: A | Discharge status: Home / Self Care | Payer: Medicare Other | Source: Ambulatory Visit | Attending: Physician Assistant | Admitting: Physician Assistant

## 2022-09-29 ENCOUNTER — Ambulatory Visit (INDEPENDENT_AMBULATORY_CARE_PROVIDER_SITE_OTHER): Payer: Medicare Other | Admitting: Physician Assistant

## 2022-09-29 VITALS — BP 167/91 | HR 86 | Ht 64.0 in | Wt 134.5 lb

## 2022-09-29 DIAGNOSIS — R310 Gross hematuria: Secondary | ICD-10-CM

## 2022-09-29 DIAGNOSIS — N200 Calculus of kidney: Secondary | ICD-10-CM

## 2022-09-29 NOTE — Progress Notes (Unsigned)
09/29/2022 2:10 PM   Virginia Warner 1946-08-24 [id number]  CC: Chief Complaint  Patient presents with   Follow-up    Follow up KUB    HPI: Virginia Warner is a 76 y.o. female with PMH gross hematuria with benign work-up in 2022 and nonobstructing left renal calculi who presents today for annual follow-up.   Today she reports occasional mild dysuria that she attributes to age. She thinks she sometimes sees some blood when she wipes and may see stone fragments in her underwear. She also reports stable urinary frequency every 1-2 hours during the day, which she attributes to polypharmacy and staying well hydrated with water. She has stopped drinking other fluids.  KUB today with stable appearing left renal stones.  No radiopaque ureteral calculi.  In-office UA today positive for trace lysed blood and trace leukocytes; urine microscopy with 11-30 WBCs/HPF, 3-10 RBCs/HPF, and moderate bacteria.   PMH: Past Medical History:  Diagnosis Date   Allergy    Arthritis    Breast cancer ([place])    Diabetes mellitus without complication ([place])    Hyperlipidemia    Hypertension    Personal history of radiation therapy    Seasonal allergies     Surgical History: Past Surgical History:  Procedure Laterality Date   ABDOMINAL HYSTERECTOMY     partial   BREAST BIOPSY Left 06/12/2020   [place] Bx, Ribbon Clip, [facility]   BREAST LUMPECTOMY Left 07/04/2020   [facility] lumpectomy with radiation   BREAST LUMPECTOMY,RADIO FREQ LOCALIZER,AXILLARY SENTINEL LYMPH NODE BIOPSY Left 07/10/2020   Procedure: BREAST LUMPECTOMY,RADIO FREQ LOCALIZER,AXILLARY SENTINEL LYMPH NODE BIOPSY;  Surgeon: Jules Husbands, MD;  Location: [facility];  Service: General;  Laterality: Left;   COLONOSCOPY WITH PROPOFOL N/A 10/18/2015   Procedure: COLONOSCOPY WITH PROPOFOL;  Surgeon: Manya Silvas, MD;  Location: [place];  Service: Endoscopy;  Laterality: N/A;   COLONOSCOPY WITH PROPOFOL N/A 08/19/2021   Procedure: COLONOSCOPY WITH  PROPOFOL;  Surgeon: [name], MD;  Location: [facility] ENDOSCOPY;  Service: Endoscopy;  Laterality: N/A;   INGUINAL HERNIA REPAIR     age 1's   POLYPECTOMY      Home Medications:  Allergies as of 09/29/2022       Reactions   Aspirin Nausea And Vomiting   Cannot tolerate unless enteric coated   Atorvastatin Other (See Comments)   Bone pain        Medication List        Accurate as of September 29, 2022  2:10 PM. If you have any questions, ask your nurse or doctor.          STOP taking these medications    famotidine 20 MG tablet Commonly known as: PEPCID   Fluticasone-Salmeterol 113-14 MCG/ACT Aepb Commonly known as: AirDuo RespiClick 676/19   latanoprost 0.005 % ophthalmic solution Commonly known as: XALATAN       TAKE these medications    acetaminophen 500 MG tablet Commonly known as: TYLENOL Take 500 mg by mouth daily as needed for moderate pain.   alendronate 70 MG tablet Commonly known as: FOSAMAX TAKE 1 TABLET EVERY 7 DAYS WITH A FULL GLASS OF WATER ON AN EMPTY STOMACH DO NOT LIE DOWN FOR AT LEAST 30 MIN   aspirin EC 81 MG tablet Take 81 mg by mouth every other day.   diltiazem 120 MG 24 hr capsule Commonly known as: CARDIZEM CD TAKE 1 CAPSULE BY MOUTH ONCE DAILY   esomeprazole 20 MG capsule Commonly known as:  NEXIUM TAKE 1 CAPSULE EVERY DAY   gabapentin 100 MG capsule Commonly known as: NEURONTIN TAKE ONE CAPSULE BY MOUTH EVERY MORNING,ONE CAPSULE IN THE AFTERNOON, AND 3 CAPSULES AT BEDTIME.   letrozole 2.5 MG tablet Commonly known as: FEMARA TAKE 1 TABLET BY MOUTH DAILY   lisinopril 20 MG tablet Commonly known as: ZESTRIL TAKE ONE TABLET BY MOUTH EVERY MORNING AND ONE IN EVENING   meloxicam 15 MG tablet Commonly known as: MOBIC Take 15 mg by mouth daily.   multivitamin with minerals Tabs tablet Take 1 tablet by mouth daily.        Allergies:  Allergies  Allergen Reactions   Aspirin Nausea And Vomiting    Cannot  tolerate unless enteric coated   Atorvastatin Other (See Comments)    Bone pain    Family History: Family History  Problem Relation Age of Onset   Cancer Brother    Breast cancer Paternal Aunt     Social History:   reports that she quit smoking about 14 years ago. Her smoking use included cigarettes. She has never used smokeless tobacco. She reports that she does not currently use alcohol. She reports that she does not use drugs.  Physical Exam: BP (!) 167/91   Pulse 86   Ht '5\' 4"'$  (1.626 m)   Wt 134 lb 8 oz (61 kg)   BMI 23.09 kg/m   Constitutional:  Alert and oriented, no acute distress, nontoxic appearing HEENT: [place], AT Cardiovascular: No clubbing, cyanosis, or edema Respiratory: Normal respiratory effort, no increased work of breathing Skin: No rashes, bruises or suspicious lesions Neurologic: Grossly intact, no focal deficits, moving all 4 extremities Psychiatric: Normal mood and affect  Laboratory Data: Results for orders placed or performed in visit on 09/29/22  Microscopic Examination   Urine  Result Value Ref Range   WBC, UA 11-30 (A) 0 - 5 /hpf   RBC, Urine 3-10 (A) 0 - 2 /hpf   Epithelial Cells (non renal) 0-10 0 - 10 /hpf   Bacteria, UA Moderate (A) None seen/Few  Urinalysis, Complete  Result Value Ref Range   Specific Gravity, UA <1.005 (L) 1.005 - 1.030   pH, UA 5.0 5.0 - 7.5   Color, UA Yellow Yellow   Appearance Ur Clear Clear   Leukocytes,UA Trace (A) Negative   Protein,UA Negative Negative/Trace   Glucose, UA Negative Negative   Ketones, UA Negative Negative   RBC, UA Trace (A) Negative   Bilirubin, UA Negative Negative   Urobilinogen, Ur 0.2 0.2 - 1.0 mg/dL   Nitrite, UA Negative Negative   Microscopic Examination See below:    Pertinent Imaging: KUB, 09/29/2022: CLINICAL DATA:  History kidney stones, initial encounter   EXAM: ABDOMEN - 1 VIEW   COMPARISON:  09/16/2021   FINDINGS: Stable nonobstructing left renal stones are noted  measuring up to 7 mm in greatest dimension. No ureteral stones are seen. No right-sided renal calculi are noted. Scattered fecal material is noted within the colon. Scoliosis of the lumbar spine concave to the left is noted.   IMPRESSION: Nonobstructing left renal stones relatively stable from the prior exam.     Electronically Signed   By: Inez Catalina M.D.   On: 09/30/2022 21:14  I personally reviewed the images referenced above and note stable nonobstructing left renal stones.  Assessment & Plan:   1. Kidney stones Stable nonobstructing left renal stones today.  We will continue to monitor annually.  She remains asymptomatic of these. - Urinalysis, Complete  2. Gross hematuria Microscopic hematuria today, though UA appears infected.  Will send for culture and call her with her results, treating if indicated.  She is minimally symptomatic today.  We discussed the need to repeat hematuria work-up every 3 to 5 years if she continues to have microscopic hematuria or if she develops new gross hematuria or flank pain. - Urinalysis, Complete - CULTURE, URINE COMPREHENSIVE  Return in about 1 year (around 09/30/2023) for 1 year follow up KUB and UA, will call with culture results.  Debroah Loop, PA-C  [facility] [address] [place], [place] [address] [phone]

## 2022-09-30 LAB — URINALYSIS, COMPLETE
Bilirubin, UA: NEGATIVE
Glucose, UA: NEGATIVE
Ketones, UA: NEGATIVE
Nitrite, UA: NEGATIVE
Protein,UA: NEGATIVE
Specific Gravity, UA: <1.005 — ABNORMAL LOW (ref 1.005–1.030)
Urobilinogen, Ur: 0.2 mg/dL (ref 0.2–1.0)
pH, UA: 5 (ref 5.0–7.5)

## 2022-09-30 LAB — MICROSCOPIC EXAMINATION

## 2022-10-02 ENCOUNTER — Ambulatory Visit (INDEPENDENT_AMBULATORY_CARE_PROVIDER_SITE_OTHER): Payer: Medicare Other | Admitting: Nurse Practitioner

## 2022-10-02 ENCOUNTER — Encounter: Payer: Self-pay | Admitting: Nurse Practitioner

## 2022-10-02 VITALS — BP 138/73 | HR 83 | Temp 97.2°F | Resp 16 | Ht 64.0 in | Wt 136.4 lb

## 2022-10-02 DIAGNOSIS — E876 Hypokalemia: Secondary | ICD-10-CM

## 2022-10-02 DIAGNOSIS — R7303 Prediabetes: Secondary | ICD-10-CM | POA: Diagnosis not present

## 2022-10-02 DIAGNOSIS — F411 Generalized anxiety disorder: Secondary | ICD-10-CM

## 2022-10-02 DIAGNOSIS — I10 Essential (primary) hypertension: Secondary | ICD-10-CM | POA: Diagnosis not present

## 2022-10-02 LAB — CULTURE, URINE COMPREHENSIVE

## 2022-10-02 MED ORDER — DILTIAZEM HCL ER COATED BEADS 120 MG PO CP24
120.0000 mg | ORAL_CAPSULE | Freq: Every day | ORAL | 3 refills | Status: DC
Start: 2022-10-02 — End: 2023-02-02

## 2022-10-02 NOTE — Progress Notes (Signed)
[facility] [place], [place] [address]  Internal MEDICINE  Office Visit Note  Patient Name: Virginia Warner  [id number]  [id number]  Date of Service: 10/02/2022  Chief Complaint  Patient presents with   Follow-up   Diabetes   Hypertension   Hyperlipidemia    HPI Virginia Warner presents for a follow up visit for prediabetes, hypertension and anxiety.  Hypertension -- BP improved and stable today, pt reports it has been lower at home as well and that she has not been worrying as much. Prediabetes -- last A1c was 6.0, eats low carb, low sugar diet. Goes walking for physical activity.  Hypokalemia -- potassium level was low at 3.3 in July. Has muscle twitches and muscle cramps off and on. Is prescribed esomeprazole but is not taking it.  GAD -- has a prescription for buspirone but never tried it. Discussed the possibility of trying it again.    Current Medication: Outpatient Encounter Medications as of 10/02/2022  Medication Sig Note   acetaminophen (TYLENOL) 500 MG tablet Take 500 mg by mouth daily as needed for moderate pain.     alendronate (FOSAMAX) 70 MG tablet TAKE 1 TABLET EVERY 7 DAYS WITH A FULL GLASS OF WATER ON AN EMPTY STOMACH DO NOT LIE DOWN FOR AT LEAST 30 MIN    aspirin EC 81 MG tablet Take 81 mg by mouth every other day.    esomeprazole (NEXIUM) 20 MG capsule TAKE 1 CAPSULE EVERY DAY    gabapentin (NEURONTIN) 100 MG capsule TAKE ONE CAPSULE BY MOUTH EVERY MORNING,ONE CAPSULE IN THE AFTERNOON, AND 3 CAPSULES AT BEDTIME.    letrozole (FEMARA) 2.5 MG tablet TAKE 1 TABLET BY MOUTH DAILY    lisinopril (ZESTRIL) 20 MG tablet TAKE ONE TABLET BY MOUTH EVERY MORNING AND ONE IN EVENING    meloxicam (MOBIC) 15 MG tablet Take 15 mg by mouth daily.    Multiple Vitamin (MULTIVITAMIN WITH MINERALS) TABS tablet Take 1 tablet by mouth daily.  06/26/2020: Pt ran out, needs to get more   [DISCONTINUED] diltiazem (CARDIZEM CD) 120 MG 24 hr capsule TAKE 1 CAPSULE BY MOUTH  ONCE DAILY    diltiazem (CARDIZEM CD) 120 MG 24 hr capsule Take 1 capsule (120 mg total) by mouth daily.    latanoprost (XALATAN) 0.005 % ophthalmic solution 1 drop at bedtime.    No facility-administered encounter medications on file as of 10/02/2022.    Surgical History: Past Surgical History:  Procedure Laterality Date   ABDOMINAL HYSTERECTOMY     partial   BREAST BIOPSY Left 06/12/2020   [place] Bx, Ribbon Clip, [facility]   BREAST LUMPECTOMY Left 07/04/2020   [facility] lumpectomy with radiation   BREAST LUMPECTOMY,RADIO FREQ LOCALIZER,AXILLARY SENTINEL LYMPH NODE BIOPSY Left 07/10/2020   Procedure: BREAST LUMPECTOMY,RADIO FREQ LOCALIZER,AXILLARY SENTINEL LYMPH NODE BIOPSY;  Surgeon: Jules Husbands, MD;  Location: [facility];  Service: General;  Laterality: Left;   COLONOSCOPY WITH PROPOFOL N/A 10/18/2015   Procedure: COLONOSCOPY WITH PROPOFOL;  Surgeon: Manya Silvas, MD;  Location: [facility] END[facility]COPY;  Service: Endoscopy;  Laterality: N/A;   COLONOSCOPY WITH PROPOFOL N/A 08/19/2021   Procedure: COLONOSCOPY WITH PROPOFOL;  Surgeon: [name], MD;  Location: [facility] ENDOSCOPY;  Service: Endoscopy;  Laterality: N/A;   INGUINAL HERNIA REPAIR     age 82's   POLYPECTOMY      Medical History: Past Medical History:  Diagnosis Date   Allergy    Arthritis    Breast cancer ([place])  Diabetes mellitus without complication ([facility])    Hyperlipidemia    Hypertension    Personal history of radiation therapy    Seasonal allergies     Family History: Family History  Problem Relation Age of Onset   Cancer Brother    Breast cancer Paternal Aunt     Social History   Socioeconomic History   Marital status: Divorced    Spouse name: Not on file   Number of children: Not on file   Years of education: Not on file   Highest education level: Not on file  Occupational History   Not on file  Tobacco Use   Smoking status: Former    Types: Cigarettes    Quit date: 11/2007    Years since quitting:  14.8   Smokeless tobacco: Never  Vaping Use   Vaping Use: Never used  Substance and Sexual Activity   Alcohol use: Not Currently    Alcohol/week: 0.0 standard drinks of alcohol   Drug use: No   Sexual activity: Not Currently  Other Topics Concern   Not on file  Social History Narrative   Not on file   Social Determinants of Health   Financial Resource Strain: Not on file  Food Insecurity: Not on file  Transportation Needs: Not on file  Physical Activity: Not on file  Stress: Not on file  Social Connections: Not on file  Intimate Partner Violence: Not on file      Review of Systems  Constitutional:  Negative for appetite change, fatigue and fever.  HENT:  Negative for congestion, mouth sores and postnasal drip.   Respiratory: Negative.  Negative for cough, chest tightness and shortness of breath.   Cardiovascular: Negative.  Negative for chest pain and palpitations.  Gastrointestinal: Negative.   Genitourinary: Negative.  Negative for flank pain.  Musculoskeletal:  Positive for arthralgias.  Neurological: Negative.  Negative for headaches.  Psychiatric/Behavioral:  Negative for behavioral problems, self-injury, sleep disturbance and suicidal ideas. The patient is nervous/anxious.     Vital Signs: BP 138/73   Pulse 83   Temp (!) 97.2 F (36.2 C)   Resp 16   Ht '5\' 4"'$  (1.626 m)   Wt 136 lb 6.4 oz (61.9 kg)   SpO2 97%   BMI 23.41 kg/m    Physical Exam Vitals reviewed.  Constitutional:      General: She is not in acute distress.    Appearance: Normal appearance. She is normal weight. She is not ill-appearing.  HENT:     Head: Normocephalic and atraumatic.  Eyes:     Pupils: Pupils are equal, round, and reactive to light.  Cardiovascular:     Rate and Rhythm: Normal rate and regular rhythm.  Pulmonary:     Effort: Pulmonary effort is normal. No respiratory distress.  Neurological:     Mental Status: She is alert and oriented to person, place, and time.      Cranial Nerves: No cranial nerve deficit.     Coordination: Coordination normal.     Gait: Gait normal.  Psychiatric:        Mood and Affect: Affect normal. Mood is anxious.        Behavior: Behavior normal. Behavior is cooperative.        Assessment/Plan: 1. Essential hypertension Continue medications as prescribed, refills ordered - diltiazem (CARDIZEM CD) 120 MG 24 hr capsule; Take 1 capsule (120 mg total) by mouth daily.  Dispense: 30 capsule; Refill: 3  2. Hypokalemia Repeat potassium level, was  3.3 on most recent labs.  - Potassium  3. Prediabetes Plan to repeat a1c at next office visit.   4. GAD (generalized anxiety disorder) Always functions with a moderate to high level of anxiety. Discussed her trying buspirone again and she is open to it. She still has the full bottle of medication that she never tried. She states she will try it for 30 days and then decide if she wants to continue it or not. If she decides to stay on it, a new prescription will be sent to pharmacy   General Counseling: Sandy Salaam understanding of the findings of todays visit and agrees with plan of treatment. I have discussed any further diagnostic evaluation that may be needed or ordered today. We also reviewed her medications today. she has been encouraged to call the office with any questions or concerns that should arise related to todays visit.    Orders Placed This Encounter  Procedures   Potassium    Meds ordered this encounter  Medications   diltiazem (CARDIZEM CD) 120 MG 24 hr capsule    Sig: Take 1 capsule (120 mg total) by mouth daily.    Dispense:  30 capsule    Refill:  3    Return in about 3 months (around 01/02/2023) for F/U, Kobie Whidby PCP.   Total time spent:30 Minutes Time spent includes review of chart, medications, test results, and follow up plan with the patient.   [place] Controlled Substance Database was reviewed by me.  This patient was seen by Jonetta Osgood,  FNP-C in collaboration with Dr. Clayborn Bigness as a part of collaborative care agreement.   Raimundo Corbit R. Valetta Fuller, MSN, FNP-C Internal medicine

## 2022-10-06 ENCOUNTER — Telehealth: Payer: Self-pay | Admitting: Oncology

## 2022-10-06 ENCOUNTER — Ambulatory Visit: Payer: Medicare Other | Admitting: Radiation Oncology

## 2022-10-06 ENCOUNTER — Inpatient Hospital Stay: Payer: Medicare Other | Admitting: Oncology

## 2022-10-06 NOTE — Telephone Encounter (Signed)
Patient called and got answering service. She left message that she wanted to cancel her appts on Monday 10/06/2022 with Dr Janese Banks and Dr Massie Maroon. She wants someone to call her after 12 pm to reschedule.

## 2022-10-13 ENCOUNTER — Ambulatory Visit
Admission: RE | Admit: 2022-10-13 | Discharge: 2022-10-13 | Disposition: A | Discharge status: Home / Self Care | Payer: Medicare Other | Source: Ambulatory Visit | Attending: Radiation Oncology | Admitting: Radiation Oncology

## 2022-10-13 ENCOUNTER — Inpatient Hospital Stay: Payer: Medicare Other | Attending: Oncology | Admitting: Oncology

## 2022-10-13 ENCOUNTER — Inpatient Hospital Stay: Payer: Medicare Other

## 2022-10-13 ENCOUNTER — Encounter: Payer: Self-pay | Admitting: Oncology

## 2022-10-13 VITALS — BP 138/87 | HR 84 | Resp 16 | Wt 135.9 lb

## 2022-10-13 DIAGNOSIS — Z08 Encounter for follow-up examination after completed treatment for malignant neoplasm: Secondary | ICD-10-CM

## 2022-10-13 DIAGNOSIS — Z79899 Other long term (current) drug therapy: Secondary | ICD-10-CM | POA: Diagnosis not present

## 2022-10-13 DIAGNOSIS — E785 Hyperlipidemia, unspecified: Secondary | ICD-10-CM | POA: Diagnosis not present

## 2022-10-13 DIAGNOSIS — Z170 Estrogen receptor positive status [ER+]: Secondary | ICD-10-CM | POA: Insufficient documentation

## 2022-10-13 DIAGNOSIS — C50912 Malignant neoplasm of unspecified site of left female breast: Secondary | ICD-10-CM | POA: Insufficient documentation

## 2022-10-13 DIAGNOSIS — Z923 Personal history of irradiation: Secondary | ICD-10-CM | POA: Diagnosis not present

## 2022-10-13 DIAGNOSIS — M8580 Other specified disorders of bone density and structure, unspecified site: Secondary | ICD-10-CM | POA: Insufficient documentation

## 2022-10-13 DIAGNOSIS — Z79811 Long term (current) use of aromatase inhibitors: Secondary | ICD-10-CM | POA: Insufficient documentation

## 2022-10-13 DIAGNOSIS — M85831 Other specified disorders of bone density and structure, right forearm: Secondary | ICD-10-CM

## 2022-10-13 DIAGNOSIS — I10 Essential (primary) hypertension: Secondary | ICD-10-CM | POA: Diagnosis not present

## 2022-10-13 DIAGNOSIS — Z87442 Personal history of urinary calculi: Secondary | ICD-10-CM | POA: Diagnosis not present

## 2022-10-13 DIAGNOSIS — E119 Type 2 diabetes mellitus without complications: Secondary | ICD-10-CM | POA: Insufficient documentation

## 2022-10-13 DIAGNOSIS — Z791 Long term (current) use of non-steroidal anti-inflammatories (NSAID): Secondary | ICD-10-CM | POA: Insufficient documentation

## 2022-10-13 DIAGNOSIS — M545 Low back pain, unspecified: Secondary | ICD-10-CM | POA: Diagnosis not present

## 2022-10-13 DIAGNOSIS — Z853 Personal history of malignant neoplasm of breast: Secondary | ICD-10-CM | POA: Diagnosis not present

## 2022-10-13 LAB — BASIC METABOLIC PANEL
Anion gap: 6 (ref 5–15)
BUN: 13 mg/dL (ref 8–23)
CO2: 29 mmol/L (ref 22–32)
Calcium: 9.4 mg/dL (ref 8.9–10.3)
Chloride: 103 mmol/L (ref 98–111)
Creatinine, Ser: 0.62 mg/dL (ref 0.44–1.00)
GFR, Estimated: >60 mL/min (ref >60)
Glucose, Bld: 95 mg/dL (ref 70–99)
Potassium: 3.4 mmol/L — ABNORMAL LOW (ref 3.5–5.1)
Sodium: 138 mmol/L (ref 135–145)

## 2022-10-13 NOTE — Progress Notes (Signed)
Radiation Oncology Follow up Note  Name: Virginia Warner   Date:   10/13/2022 MRN:  [id number] DOB: [birth year]    This 76 y.o. 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  REFERRING PROVIDER: [name], MD  HPI: Patient is a 76 year old female now at 2 years having completed whole breast radiation to her left breast for stage I (T1b N0 M0) ER/PR positive invasive mammary carcinoma she specifically denies breast tenderness cough or bone pain..  She had mammograms back in June which I have reviewed were BI-RADS 2 benign.  She is currently on Femara tolerating it well without side effect.  COMPLICATIONS OF TREATMENT: none  FOLLOW UP COMPLIANCE: keeps appointments   PHYSICAL EXAM:  There were no vitals taken for this visit. Lungs are clear to A&P cardiac examination essentially unremarkable with regular rate and rhythm. No dominant mass or nodularity is noted in either breast in 2 positions examined. Incision is well-healed. No axillary or supraclavicular adenopathy is appreciated. Cosmetic result is excellent.  Well-developed well-nourished patient in NAD. HEENT reveals PERLA, EOMI, discs not visualized.  Oral cavity is clear. No oral mucosal lesions are identified. Neck is clear without evidence of cervical or supraclavicular adenopathy. Lungs are clear to A&P. Cardiac examination is essentially unremarkable with regular rate and rhythm without murmur rub or thrill. Abdomen is benign with no organomegaly or masses noted. Motor sensory and DTR levels are equal and symmetric in the upper and lower extremities. Cranial nerves II through XII are grossly intact. Proprioception is intact. No peripheral adenopathy or edema is identified. No motor or sensory levels are noted. Crude visual fields are within normal range.  RADIOLOGY RESULTS: Mammograms reviewed compatible with  above-stated findings  PLAN: Present time patient is doing well with no evidence of disease now at 2 years.  See her back 1 more time in a year and then discontinue follow-up care.  Patient is to call with any concerns at any time.  She continues on Femara without side effect.  Patient is to call with any concerns.  I would like to take this opportunity to thank you for allowing me to participate in the care of your patient.Noreene Filbert, MD

## 2022-10-13 NOTE — Progress Notes (Signed)
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  This was followed by diagnostic mammogram and ultrasound which showed a 7 x 7 x 4 mm mass 3 cm from the nipple at the 7 o'clock position.  Normal-appearing left axillary lymph nodes.  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  ER strongly positive greater than 90%, PR 1 to 10% positive and HER-2 negative   Patient underwent lumpectomy and sentinel lymph node biopsy on 07/10/2020.  Final pathology showed 8 mm grade 1 invasive mucinous carcinoma with negative margins.  Sentinel lymph nodes were negative for malignancy.   Patient completed adjuvant radiation therapy and started letrozole in November 2021  Interval history-she is doing well overall.  She does complain of some low back pain for which she follows up with Ortho and PCP and will be getting back injections soon.  Tolerates letrozole well otherwise.  ECOG PS- 1 Pain scale- 0   Review of systems- Review of Systems   Constitutional:  Positive for malaise/fatigue. Negative for chills, fever and weight loss.  HENT:  Negative for congestion, ear discharge and nosebleeds.   Eyes:  Negative for blurred vision.  Respiratory:  Negative for cough, hemoptysis, sputum production, shortness of breath and wheezing.   Cardiovascular:  Negative for chest pain, palpitations, orthopnea and claudication.  Gastrointestinal:  Negative for abdominal pain, blood in stool, constipation, diarrhea, heartburn, melena, nausea and vomiting.  Genitourinary:  Negative for dysuria, flank pain, frequency, hematuria and urgency.  Musculoskeletal:  Positive for back pain. Negative for joint pain and myalgias.  Skin:  Negative for rash.  Neurological:  Negative for dizziness, tingling, focal weakness, seizures, weakness and headaches.  Endo/Heme/Allergies:  Does not bruise/bleed easily.  Psychiatric/Behavioral:  Negative for depression and suicidal ideas. The patient does not have insomnia.       Allergies  Allergen Reactions   Aspirin Nausea And Vomiting    Cannot tolerate unless enteric coated   Atorvastatin Other (See Comments)    Bone pain     Past Medical History:  Diagnosis Date   Allergy    Arthritis    Breast cancer ([place])    Diabetes mellitus without complication ([place])    Hyperlipidemia    Hypertension    Personal history of radiation therapy    Seasonal allergies      Past Surgical History:  Procedure Laterality Date   ABDOMINAL HYSTERECTOMY     partial   BREAST BIOPSY Left 06/12/2020   [place] Bx, Ribbon Clip, [facility]   BREAST LUMPECTOMY Left 07/04/2020   [facility] lumpectomy with radiation   BREAST LUMPECTOMY,RADIO FREQ LOCALIZER,AXILLARY SENTINEL LYMPH NODE BIOPSY Left 07/10/2020  Procedure: BREAST LUMPECTOMY,RADIO FREQ LOCALIZER,AXILLARY SENTINEL LYMPH NODE BIOPSY;  Surgeon: Pabon, Diego F, MD;  Location: [facility] ORS;  Service: General;  Laterality: Left;   COLONOSCOPY WITH PROPOFOL N/A 10/18/2015   Procedure:  COLONOSCOPY WITH PROPOFOL;  Surgeon: Robert T Elliott, MD;  Location: [facility] ENDOSCOPY;  Service: Endoscopy;  Laterality: N/A;   COLONOSCOPY WITH PROPOFOL N/A 08/19/2021   Procedure: COLONOSCOPY WITH PROPOFOL;  Surgeon: Locklear, Cameron T, MD;  Location: [facility] ENDOSCOPY;  Service: Endoscopy;  Laterality: N/A;   INGUINAL HERNIA REPAIR     age 20's   POLYPECTOMY      Social History   Socioeconomic History   Marital status: Divorced    Spouse name: Not on file   Number of children: Not on file   Years of education: Not on file   Highest education level: Not on file  Occupational History   Not on file  Tobacco Use   Smoking status: Former    Types: Cigarettes    Quit date: 11/2007    Years since quitting: 14.8   Smokeless tobacco: Never  Vaping Use   Vaping Use: Never used  Substance and Sexual Activity   Alcohol use: Not Currently    Alcohol/week: 0.0 standard drinks of alcohol   Drug use: No   Sexual activity: Not Currently  Other Topics Concern   Not on file  Social History Narrative   Not on file   Social Determinants of Health   Financial Resource Strain: Not on file  Food Insecurity: Not on file  Transportation Needs: Not on file  Physical Activity: Not on file  Stress: Not on file  Social Connections: Not on file  Intimate Partner Violence: Not on file    Family History  Problem Relation Age of Onset   Cancer Brother    Breast cancer Paternal Aunt      Current Outpatient Medications:    acetaminophen (TYLENOL) 500 MG tablet, Take 500 mg by mouth daily as needed for moderate pain. , Disp: , Rfl:    alendronate (FOSAMAX) 70 MG tablet, TAKE 1 TABLET EVERY 7 DAYS WITH A FULL GLASS OF WATER ON AN EMPTY STOMACH DO NOT LIE DOWN FOR AT LEAST 30 MIN, Disp: 4 tablet, Rfl: 3   aspirin EC 81 MG tablet, Take 81 mg by mouth every other day., Disp: , Rfl:    diltiazem (CARDIZEM CD) 120 MG 24 hr capsule, Take 1 capsule (120 mg total) by mouth daily., Disp: 30 capsule, Rfl:  3   latanoprost (XALATAN) 0.005 % ophthalmic solution, 1 drop at bedtime., Disp: , Rfl:    letrozole (FEMARA) 2.5 MG tablet, TAKE 1 TABLET BY MOUTH DAILY, Disp: 30 tablet, Rfl: 1   lisinopril (ZESTRIL) 20 MG tablet, TAKE ONE TABLET BY MOUTH EVERY MORNING AND ONE IN EVENING, Disp: 180 tablet, Rfl: 3   Multiple Vitamin (MULTIVITAMIN WITH MINERALS) TABS tablet, Take 1 tablet by mouth daily. , Disp: , Rfl:    predniSONE (DELTASONE) 5 MG tablet, Take by mouth., Disp: , Rfl:    esomeprazole (NEXIUM) 20 MG capsule, TAKE 1 CAPSULE EVERY DAY (Patient not taking: Reported on 10/13/2022), Disp: 90 capsule, Rfl: 3   gabapentin (NEURONTIN) 100 MG capsule, TAKE ONE CAPSULE BY MOUTH EVERY MORNING,ONE CAPSULE IN THE AFTERNOON, AND 3 CAPSULES AT BEDTIME. (Patient not taking: Reported on 10/13/2022), Disp: 150 capsule, Rfl: 3   meloxicam (MOBIC) 15 MG tablet, Take 15 mg by mouth daily. (Patient not taking: Reported on 10/13/2022), Disp: , Rfl:     Physical exam:  Vitals:   10/13/22 0933  BP: 138/87  Pulse: 84  Resp: 16  SpO2: 100%  Weight: 135 lb 14.4 oz (61.6 kg)   Physical Exam Constitutional:      General: She is not in acute distress. Cardiovascular:     Rate and Rhythm: Normal rate and regular rhythm.     Heart sounds: Normal heart sounds.  Pulmonary:     Effort: Pulmonary effort is normal.     Breath sounds: Normal breath sounds.  Skin:    General: Skin is warm and dry.  Neurological:     Mental Status: She is alert and oriented to person, place, and time.    Breast exam was performed in seated and lying down position. Patient is status post left lumpectomy with a well-healed surgical scar. No evidence of any palpable masses. No evidence of axillary adenopathy. No evidence of any palpable masses or lumps in the right breast. No evidence of right axillary adenopathy      Latest Ref Rng & Units 10/13/2022   10:02 AM  CMP  Glucose 70 - 99 mg/dL 95   BUN 8 - 23 mg/dL 13   Creatinine 0.44 -  1.00 mg/dL 0.62   Sodium 135 - 145 mmol/L 138   Potassium 3.5 - 5.1 mmol/L 3.4   Chloride 98 - 111 mmol/L 103   CO2 22 - 32 mmol/L 29   Calcium 8.9 - 10.3 mg/dL 9.4       Latest Ref Rng & Units 07/20/2022    7:14 AM  CBC  WBC 4.0 - 10.5 K/uL 5.4   Hemoglobin 12.0 - 15.0 g/dL 13.2   Hematocrit 36.0 - 46.0 % 42.3   Platelets 150 - 400 K/uL 389     No images are attached to the encounter.  DG Abd 1 View  Result Date: 09/30/2022 CLINICAL DATA:  History kidney stones, initial encounter EXAM: ABDOMEN - 1 VIEW COMPARISON:  09/16/2021 FINDINGS: Stable nonobstructing left renal stones are noted measuring up to 7 mm in greatest dimension. No ureteral stones are seen. No right-sided renal calculi are noted. Scattered fecal material is noted within the colon. Scoliosis of the lumbar spine concave to the left is noted. IMPRESSION: Nonobstructing left renal stones relatively stable from the prior exam. Electronically Signed   By: Mark  Lukens M.D.   On: 09/30/2022 21:14     Assessment and plan- Patient is a 76 y.o. female with history of stage I left breast cancer ER/PR positive HER2 negative. She is currently on letrozole and this is a routine f/u visit  Clinically she is doing well with no concerning signs and symptoms of recurrence based on today's exam.  Her recent mammogram from June 2023 was unremarkable.Patient will continue taking letrozole for 5 years.  Her bone density scan in June 2023 showed osteopenia with a T score of -2.2 in her forearm.  Overall bone density scans have remained stable over the last couple of years.  I will see her back in 6 months no labs   Visit Diagnosis 1. Encounter for follow-up surveillance of breast cancer   2. Use of letrozole (Femara)   3. Osteopenia of right forearm      Dr. Archana Rao, MD, MPH [facility] [phone] 10/13/2022 12:49 PM                

## 2022-10-14 ENCOUNTER — Other Ambulatory Visit: Payer: Self-pay | Admitting: Family Medicine

## 2022-10-14 ENCOUNTER — Ambulatory Visit: Admission: RE | Admit: 2022-10-14 | Payer: Medicare Other | Source: Ambulatory Visit

## 2022-10-14 ENCOUNTER — Ambulatory Visit: Payer: Medicare Other | Admitting: Nurse Practitioner

## 2022-10-14 ENCOUNTER — Other Ambulatory Visit (HOSPITAL_COMMUNITY): Payer: Self-pay | Admitting: Family Medicine

## 2022-10-14 DIAGNOSIS — S32591A Other specified fracture of right pubis, initial encounter for closed fracture: Secondary | ICD-10-CM

## 2022-10-16 ENCOUNTER — Ambulatory Visit
Admission: RE | Admit: 2022-10-16 | Discharge: 2022-10-16 | Disposition: A | Discharge status: Home / Self Care | Payer: Medicare Other | Source: Ambulatory Visit | Attending: Family Medicine | Admitting: Family Medicine

## 2022-10-16 DIAGNOSIS — S32592A Other specified fracture of left pubis, initial encounter for closed fracture: Secondary | ICD-10-CM | POA: Diagnosis present

## 2022-10-16 DIAGNOSIS — S32591A Other specified fracture of right pubis, initial encounter for closed fracture: Secondary | ICD-10-CM | POA: Insufficient documentation

## 2022-10-25 ENCOUNTER — Other Ambulatory Visit: Payer: Self-pay | Admitting: Oncology

## 2022-10-28 ENCOUNTER — Ambulatory Visit (INDEPENDENT_AMBULATORY_CARE_PROVIDER_SITE_OTHER): Payer: Medicare Other | Admitting: Nurse Practitioner

## 2022-10-28 ENCOUNTER — Encounter: Payer: Self-pay | Admitting: Nurse Practitioner

## 2022-10-28 VITALS — BP 141/82 | HR 85 | Temp 97.9°F | Resp 16 | Ht 64.0 in | Wt 136.8 lb

## 2022-10-28 DIAGNOSIS — F411 Generalized anxiety disorder: Secondary | ICD-10-CM

## 2022-10-28 DIAGNOSIS — I10 Essential (primary) hypertension: Secondary | ICD-10-CM | POA: Diagnosis not present

## 2022-10-28 DIAGNOSIS — R7303 Prediabetes: Secondary | ICD-10-CM | POA: Diagnosis not present

## 2022-10-28 LAB — POCT GLYCOSYLATED HEMOGLOBIN (HGB A1C): Hemoglobin A1C: 6.2 % — AB (ref 4.0–5.6)

## 2022-10-28 NOTE — Progress Notes (Signed)
[facility] [place], [place] [address]  Internal MEDICINE  Office Visit Note  Patient Name: Virginia Warner  [id number]  [id number]  Date of Service: 10/28/2022  Chief Complaint  Patient presents with   Follow-up   Hypertension   Hyperlipidemia   Diabetes    HPI Virginia Warner presents for a follow up visit for hypertension, diabetes and anxiety.  Hypertension -- BP is stable and even better at home per patient report, has white coat syndrome when she comes to the office and her BP usually improves by the end of the visit.  Diabetes -- A1C stable at 6.2 today, remains in prediabetic range.  Anxiety -- functions with some level of anxiety most of the time often worried the most about being by herself and getting sick with any condition or ailment. Have encouraged patient in the past to try buspirone and she keeps saying she will but then she does not try it. People that are around her in her daily life have encouraged her to try the medication and she still has not. Today she is considering trying the medication while she is on vacation and visiting with family in [place]. If she tries it, she will take only 1 tablet daily to start for 1 month and see if it helps.      Current Medication: Outpatient Encounter Medications as of 10/28/2022  Medication Sig Note   acetaminophen (TYLENOL) 500 MG tablet Take 500 mg by mouth daily as needed for moderate pain.     alendronate (FOSAMAX) 70 MG tablet TAKE 1 TABLET EVERY 7 DAYS WITH A FULL GLASS OF WATER ON AN EMPTY STOMACH DO NOT LIE DOWN FOR AT LEAST 30 MIN    aspirin EC 81 MG tablet Take 81 mg by mouth every other day.    diltiazem (CARDIZEM CD) 120 MG 24 hr capsule Take 1 capsule (120 mg total) by mouth daily.    gabapentin (NEURONTIN) 100 MG capsule TAKE ONE CAPSULE BY MOUTH EVERY MORNING,ONE CAPSULE IN THE AFTERNOON, AND 3 CAPSULES AT BEDTIME.    latanoprost (XALATAN) 0.005 % ophthalmic solution 1 drop at bedtime.     letrozole (FEMARA) 2.5 MG tablet TAKE 1 TABLET BY MOUTH DAILY    lisinopril (ZESTRIL) 20 MG tablet TAKE ONE TABLET BY MOUTH EVERY MORNING AND ONE IN EVENING    meloxicam (MOBIC) 15 MG tablet Take 15 mg by mouth daily.    Multiple Vitamin (MULTIVITAMIN WITH MINERALS) TABS tablet Take 1 tablet by mouth daily.  06/26/2020: Pt ran out, needs to get more   predniSONE (DELTASONE) 5 MG tablet Take by mouth.    esomeprazole (NEXIUM) 20 MG capsule TAKE 1 CAPSULE EVERY DAY (Patient not taking: Reported on 10/13/2022)    No facility-administered encounter medications on file as of 10/28/2022.    Surgical History: Past Surgical History:  Procedure Laterality Date   ABDOMINAL HYSTERECTOMY     partial   BREAST BIOPSY Left 06/12/2020   [place] Bx, Ribbon Clip, [facility]   BREAST LUMPECTOMY Left 07/04/2020   [facility] lumpectomy with radiation   BREAST LUMPECTOMY,RADIO FREQ LOCALIZER,AXILLARY SENTINEL LYMPH NODE BIOPSY Left 07/10/2020   Procedure: BREAST LUMPECTOMY,RADIO FREQ LOCALIZER,AXILLARY SENTINEL LYMPH NODE BIOPSY;  Surgeon: Jules Husbands, MD;  Location: [facility] ORS;  Service: General;  Laterality: Left;   COLONOSCOPY WITH PROPOFOL N/A 10/18/2015   Procedure: COLONOSCOPY WITH PROPOFOL;  Surgeon: Manya Silvas, MD;  Location: [facility] ENDOSCOPY;  Service: Endoscopy;  Laterality: N/A;   COLONOSCOPY WITH PROPOFOL  N/A 08/19/2021   Procedure: COLONOSCOPY WITH PROPOFOL;  Surgeon: [name], MD;  Location: [facility] ENDOSCOPY;  Service: Endoscopy;  Laterality: N/A;   INGUINAL HERNIA REPAIR     age 89's   POLYPECTOMY      Medical History: Past Medical History:  Diagnosis Date   Allergy    Arthritis    Breast cancer ([place])    Diabetes mellitus without complication ([place])    Hyperlipidemia    Hypertension    Personal history of radiation therapy    Seasonal allergies     Family History: Family History  Problem Relation Age of Onset   Cancer Brother    Breast cancer Paternal Aunt     Social History    Socioeconomic History   Marital status: Divorced    Spouse name: Not on file   Number of children: Not on file   Years of education: Not on file   Highest education level: Not on file  Occupational History   Not on file  Tobacco Use   Smoking status: Former    Types: Cigarettes    Quit date: 11/2007    Years since quitting: 14.9   Smokeless tobacco: Never  Vaping Use   Vaping Use: Never used  Substance and Sexual Activity   Alcohol use: Not Currently    Alcohol/week: 0.0 standard drinks of alcohol   Drug use: No   Sexual activity: Not Currently  Other Topics Concern   Not on file  Social History Narrative   Not on file   Social Determinants of Health   Financial Resource Strain: Not on file  Food Insecurity: Not on file  Transportation Needs: Not on file  Physical Activity: Not on file  Stress: Not on file  Social Connections: Not on file  Intimate Partner Violence: Not on file      Review of Systems  Constitutional:  Negative for appetite change, fatigue and fever.  HENT:  Negative for congestion, mouth sores and postnasal drip.   Respiratory: Negative.  Negative for cough, chest tightness and shortness of breath.   Cardiovascular: Negative.  Negative for chest pain and palpitations.  Gastrointestinal: Negative.   Genitourinary: Negative.  Negative for flank pain.  Musculoskeletal:  Positive for arthralgias.  Neurological: Negative.  Negative for headaches.  Psychiatric/Behavioral:  Negative for behavioral problems, self-injury, sleep disturbance and suicidal ideas. The patient is nervous/anxious.     Vital Signs: BP (!) 141/82   Pulse 85   Temp 97.9 F (36.6 C)   Resp 16   Ht '5\' 4"'$  (1.626 m)   Wt 136 lb 12.8 oz (62.1 kg)   SpO2 94%   BMI 23.48 kg/m    Physical Exam Vitals reviewed.  Constitutional:      General: She is not in acute distress.    Appearance: Normal appearance. She is normal weight. She is not ill-appearing.  HENT:     Head:  Normocephalic and atraumatic.  Eyes:     Pupils: Pupils are equal, round, and reactive to light.  Cardiovascular:     Rate and Rhythm: Normal rate and regular rhythm.  Pulmonary:     Effort: Pulmonary effort is normal. No respiratory distress.  Neurological:     Mental Status: She is alert and oriented to person, place, and time.     Cranial Nerves: No cranial nerve deficit.     Coordination: Coordination normal.     Gait: Gait normal.  Psychiatric:        Mood and  Affect: Affect normal. Mood is anxious.        Behavior: Behavior normal. Behavior is cooperative.        Assessment/Plan: 1. Essential hypertension Stable, continue medications as prescribed and continue to check BP regularly at home.   2. Prediabetes Stable with A1c at 6.2, diet controlled, good job! - POCT glycosylated hemoglobin (Hb A1C)  3. GAD (generalized anxiety disorder) Instructed patient to try buspirone 5 mg daily and see how she feels.    General Counseling: Virginia Warner understanding of the findings of todays visit and agrees with plan of treatment. I have discussed any further diagnostic evaluation that may be needed or ordered today. We also reviewed her medications today. she has been encouraged to call the office with any questions or concerns that should arise related to todays visit.    Orders Placed This Encounter  Procedures   POCT glycosylated hemoglobin (Hb A1C)    No orders of the defined types were placed in this encounter.   Return in about 6 weeks (around 12/09/2022) for F/U, eval new med, [place] PCP.   Total time spent:30 Minutes Time spent includes review of chart, medications, test results, and follow up plan with the patient.   [place] Controlled Substance Database was reviewed by me.  This patient was seen by Jonetta Osgood, FNP-C in collaboration with Dr. Clayborn Bigness as a part of collaborative care agreement.   Gennell How R. Valetta Fuller, MSN, FNP-C Internal medicine

## 2022-11-28 ENCOUNTER — Other Ambulatory Visit: Payer: Self-pay | Admitting: Nurse Practitioner

## 2022-11-28 DIAGNOSIS — M810 Age-related osteoporosis without current pathological fracture: Secondary | ICD-10-CM

## 2022-11-28 DIAGNOSIS — G5793 Unspecified mononeuropathy of bilateral lower limbs: Secondary | ICD-10-CM

## 2022-12-09 ENCOUNTER — Ambulatory Visit (INDEPENDENT_AMBULATORY_CARE_PROVIDER_SITE_OTHER): Payer: Medicare Other | Admitting: Nurse Practitioner

## 2022-12-09 ENCOUNTER — Encounter: Payer: Self-pay | Admitting: Nurse Practitioner

## 2022-12-09 ENCOUNTER — Telehealth: Payer: Self-pay

## 2022-12-09 VITALS — BP 138/88 | HR 77 | Temp 97.0°F | Resp 16 | Ht 64.0 in | Wt 137.0 lb

## 2022-12-09 DIAGNOSIS — Z2911 Encounter for prophylactic immunotherapy for respiratory syncytial virus (RSV): Secondary | ICD-10-CM

## 2022-12-09 DIAGNOSIS — R7303 Prediabetes: Secondary | ICD-10-CM | POA: Diagnosis not present

## 2022-12-09 DIAGNOSIS — F411 Generalized anxiety disorder: Secondary | ICD-10-CM

## 2022-12-09 DIAGNOSIS — I10 Essential (primary) hypertension: Secondary | ICD-10-CM

## 2022-12-09 MED ORDER — AREXVY 120 MCG/0.5ML IM SUSR
0.5000 mL | Freq: Once | INTRAMUSCULAR | 0 refills | Status: AC
Start: 2022-12-09 — End: 2022-12-09

## 2022-12-09 NOTE — Progress Notes (Signed)
[facility] [place], [place] [address]  Internal MEDICINE  Office Visit Note  Patient Name: Virginia Warner  [id number]  [id number]  Date of Service: 12/09/2022  Chief Complaint  Patient presents with   Follow-up   Diabetes   Hypertension   Hyperlipidemia    HPI Virginia Warner presents for a follow up visit for anxiety, diabetes and hypertension. Anxiety -- Not tried buspirone yet, not sure if she will. Decreased stress and anxiety after going to the beach in [place] and spending time with family.  Got covid while in [place], feel better now BP stable Will check A1c in January       Current Medication: Outpatient Encounter Medications as of 12/09/2022  Medication Sig Note   acetaminophen (TYLENOL) 500 MG tablet Take 500 mg by mouth daily as needed for moderate pain.     alendronate (FOSAMAX) 70 MG tablet TAKE 1 TABLET EVERY 7 DAYS WITH A FULL GLASS OF WATER ON AN EMPTY STOMACH DO NOT LIE DOWN FOR AT LEAST 30 MIN    aspirin EC 81 MG tablet Take 81 mg by mouth every other day.    diltiazem (CARDIZEM CD) 120 MG 24 hr capsule Take 1 capsule (120 mg total) by mouth daily.    gabapentin (NEURONTIN) 100 MG capsule TAKE ONE CAPSULE BY MOUTH EVERY MORNING,ONE CAPSULE IN THE AFTERNOON, AND 3 CAPSULES AT BEDTIME.    latanoprost (XALATAN) 0.005 % ophthalmic solution 1 drop at bedtime.    letrozole (FEMARA) 2.5 MG tablet TAKE 1 TABLET BY MOUTH DAILY    lisinopril (ZESTRIL) 20 MG tablet TAKE ONE TABLET BY MOUTH EVERY MORNING AND ONE IN EVENING    meloxicam (MOBIC) 15 MG tablet Take 15 mg by mouth daily.    Multiple Vitamin (MULTIVITAMIN WITH MINERALS) TABS tablet Take 1 tablet by mouth daily.  06/26/2020: Pt ran out, needs to get more   predniSONE (DELTASONE) 5 MG tablet Take by mouth.    RSV vaccine recomb adjuvanted (AREXVY) 120 MCG/0.5ML injection Inject 0.5 mLs into the muscle once for 1 dose.    esomeprazole (NEXIUM) 20 MG capsule TAKE 1 CAPSULE EVERY DAY (Patient  not taking: Reported on 10/13/2022)    No facility-administered encounter medications on file as of 12/09/2022.    Surgical History: Past Surgical History:  Procedure Laterality Date   ABDOMINAL HYSTERECTOMY     partial   BREAST BIOPSY Left 06/12/2020   [place] Bx, Ribbon Clip, [facility]   BREAST LUMPECTOMY Left 07/04/2020   [facility] lumpectomy with radiation   BREAST LUMPECTOMY,RADIO FREQ LOCALIZER,AXILLARY SENTINEL LYMPH NODE BIOPSY Left 07/10/2020   Procedure: BREAST LUMPECTOMY,RADIO FREQ LOCALIZER,AXILLARY SENTINEL LYMPH NODE BIOPSY;  Surgeon: Jules Husbands, MD;  Location: [facility] ORS;  Service: General;  Laterality: Left;   COLONOSCOPY WITH PROPOFOL N/A 10/18/2015   Procedure: COLONOSCOPY WITH PROPOFOL;  Surgeon: Manya Silvas, MD;  Location: [facility] ENDOSCOPY;  Service: Endoscopy;  Laterality: N/A;   COLONOSCOPY WITH PROPOFOL N/A 08/19/2021   Procedure: COLONOSCOPY WITH PROPOFOL;  Surgeon: [name], MD;  Location: [facility] ENDOSCOPY;  Service: Endoscopy;  Laterality: N/A;   INGUINAL HERNIA REPAIR     age 62's   POLYPECTOMY      Medical History: Past Medical History:  Diagnosis Date   Allergy    Arthritis    Breast cancer ([place])    Diabetes mellitus without complication ([place])    Hyperlipidemia    Hypertension    Personal history of radiation therapy    Seasonal allergies  Family History: Family History  Problem Relation Age of Onset   Cancer Brother    Breast cancer Paternal Aunt     Social History   Socioeconomic History   Marital status: Divorced    Spouse name: Not on file   Number of children: Not on file   Years of education: Not on file   Highest education level: Not on file  Occupational History   Not on file  Tobacco Use   Smoking status: Former    Types: Cigarettes    Quit date: 11/2007    Years since quitting: 15.0   Smokeless tobacco: Never  Vaping Use   Vaping Use: Never used  Substance and Sexual Activity   Alcohol use: Not Currently     Alcohol/week: 0.0 standard drinks of alcohol   Drug use: No   Sexual activity: Not Currently  Other Topics Concern   Not on file  Social History Narrative   Not on file   Social Determinants of Health   Financial Resource Strain: Not on file  Food Insecurity: Not on file  Transportation Needs: Not on file  Physical Activity: Not on file  Stress: Not on file  Social Connections: Not on file  Intimate Partner Violence: Not on file      Review of Systems  Constitutional:  Negative for appetite change, fatigue and fever.  HENT:  Negative for congestion, mouth sores and postnasal drip.   Respiratory: Negative.  Negative for cough, chest tightness and shortness of breath.   Cardiovascular: Negative.  Negative for chest pain and palpitations.  Gastrointestinal: Negative.   Genitourinary: Negative.  Negative for flank pain.  Musculoskeletal:  Positive for arthralgias.  Neurological: Negative.  Negative for headaches.  Psychiatric/Behavioral:  Negative for behavioral problems, self-injury, sleep disturbance and suicidal ideas. The patient is nervous/anxious.     Vital Signs: BP 138/88 Comment: 146/98  Pulse 77   Temp (!) 97 F (36.1 C)   Resp 16   Ht '5\' 4"'$  (1.626 m)   Wt 137 lb (62.1 kg)   SpO2 95%   BMI 23.52 kg/m    Physical Exam Vitals reviewed.  Constitutional:      General: She is not in acute distress.    Appearance: Normal appearance. She is normal weight. She is not ill-appearing.  HENT:     Head: Normocephalic and atraumatic.  Eyes:     Pupils: Pupils are equal, round, and reactive to light.  Cardiovascular:     Rate and Rhythm: Normal rate and regular rhythm.  Pulmonary:     Effort: Pulmonary effort is normal. No respiratory distress.  Neurological:     Mental Status: She is alert and oriented to person, place, and time.     Cranial Nerves: No cranial nerve deficit.     Coordination: Coordination normal.     Gait: Gait normal.  Psychiatric:         Mood and Affect: Affect normal. Mood is anxious.        Behavior: Behavior normal. Behavior is cooperative.        Assessment/Plan: 1. Prediabetes Repeat A1c in 2 more months.   2. Essential hypertension Stable, continue monitoring BP at home, continue medications as prescribed.   3. GAD (generalized anxiety disorder) Still has not tried buspirone, feels good right now, just came back from relaxing trip to [place].   4. Need for prophylactic vaccination and inoculation against respiratory syncytial virus (RSV) RSV vaccine ordered   General Counseling: Daizha verbalizes understanding  of the findings of todays visit and agrees with plan of treatment. I have discussed any further diagnostic evaluation that may be needed or ordered today. We also reviewed her medications today. she has been encouraged to call the office with any questions or concerns that should arise related to todays visit.    No orders of the defined types were placed in this encounter.   Meds ordered this encounter  Medications   RSV vaccine recomb adjuvanted (AREXVY) 120 MCG/0.5ML injection    Sig: Inject 0.5 mLs into the muscle once for 1 dose.    Dispense:  0.5 mL    Refill:  0    Return in about 2 months (around 02/09/2023) for F/U, Recheck A1C, Nayib Remer PCP.   Total time spent:30 Minutes Time spent includes review of chart, medications, test results, and follow up plan with the patient.   [place] Controlled Substance Database was reviewed by me.  This patient was seen by Jonetta Osgood, FNP-C in collaboration with Dr. Clayborn Bigness as a part of collaborative care agreement.   Yaslene Lindamood R. Valetta Fuller, MSN, FNP-C Internal medicine

## 2022-12-10 NOTE — Telephone Encounter (Addendum)
As per alyssa lmom  pt that if don't  develop any symptoms its go away itself

## 2022-12-13 ENCOUNTER — Encounter: Payer: Self-pay | Admitting: Nurse Practitioner

## 2023-01-01 ENCOUNTER — Encounter: Payer: Self-pay | Admitting: Nurse Practitioner

## 2023-01-01 ENCOUNTER — Ambulatory Visit (INDEPENDENT_AMBULATORY_CARE_PROVIDER_SITE_OTHER): Payer: 59 | Admitting: Nurse Practitioner

## 2023-01-01 VITALS — BP 135/80 | HR 100 | Temp 98.3°F | Resp 16 | Ht 64.0 in | Wt 136.8 lb

## 2023-01-01 DIAGNOSIS — E876 Hypokalemia: Secondary | ICD-10-CM | POA: Diagnosis not present

## 2023-01-01 DIAGNOSIS — J309 Allergic rhinitis, unspecified: Secondary | ICD-10-CM | POA: Diagnosis not present

## 2023-01-01 DIAGNOSIS — I10 Essential (primary) hypertension: Secondary | ICD-10-CM

## 2023-01-01 DIAGNOSIS — Z2911 Encounter for prophylactic immunotherapy for respiratory syncytial virus (RSV): Secondary | ICD-10-CM | POA: Diagnosis not present

## 2023-01-01 DIAGNOSIS — R0982 Postnasal drip: Secondary | ICD-10-CM | POA: Diagnosis not present

## 2023-01-01 MED ORDER — AREXVY 120 MCG/0.5ML IM SUSR: 0.5000 mL | Freq: Once | INTRAMUSCULAR | 0 refills | Status: AC

## 2023-01-01 NOTE — Progress Notes (Signed)
[facility] [place], [place] [address]  Internal MEDICINE  Office Visit Note  Patient Name: Virginia Warner  [id number]  [id number]  Date of Service: 01/01/2023  Chief Complaint  Patient presents with   Follow-up   Hyperlipidemia   Hypertension   Diabetes    HPI Virginia Warner presents for a follow-up visit for allergies, hypokalemia and hypertension Postnasal drainage/allergies -- with cough and nasal congestion Low potassium -- has been eating bananas more. Last potassium level was 3.4 in december Hypertension -- blood pressure is stable today and was good at home per patient.  Wants RSV vaccine    Current Medication: Outpatient Encounter Medications as of 01/01/2023  Medication Sig Note   acetaminophen (TYLENOL) 500 MG tablet Take 500 mg by mouth daily as needed for moderate pain.     alendronate (FOSAMAX) 70 MG tablet TAKE 1 TABLET EVERY 7 DAYS WITH A FULL GLASS OF WATER ON AN EMPTY STOMACH DO NOT LIE DOWN FOR AT LEAST 30 MIN    aspirin EC 81 MG tablet Take 81 mg by mouth every other day.    diltiazem (CARDIZEM CD) 120 MG 24 hr capsule Take 1 capsule (120 mg total) by mouth daily.    gabapentin (NEURONTIN) 100 MG capsule TAKE ONE CAPSULE BY MOUTH EVERY MORNING,ONE CAPSULE IN THE AFTERNOON, AND 3 CAPSULES AT BEDTIME.    latanoprost (XALATAN) 0.005 % ophthalmic solution 1 drop at bedtime.    letrozole (FEMARA) 2.5 MG tablet TAKE 1 TABLET BY MOUTH DAILY    lisinopril (ZESTRIL) 20 MG tablet TAKE ONE TABLET BY MOUTH EVERY MORNING AND ONE IN EVENING    meloxicam (MOBIC) 15 MG tablet Take 15 mg by mouth daily.    Multiple Vitamin (MULTIVITAMIN WITH MINERALS) TABS tablet Take 1 tablet by mouth daily.  06/26/2020: Pt ran out, needs to get more   predniSONE (DELTASONE) 5 MG tablet Take by mouth.    [EXPIRED] RSV vaccine recomb adjuvanted (AREXVY) 120 MCG/0.5ML injection Inject 0.5 mLs into the muscle once for 1 dose.    No facility-administered encounter medications on  file as of 01/01/2023.    Surgical History: Past Surgical History:  Procedure Laterality Date   ABDOMINAL HYSTERECTOMY     partial   BREAST BIOPSY Left 06/12/2020   [place] Bx, Ribbon Clip, [facility]   BREAST LUMPECTOMY Left 07/04/2020   [facility] lumpectomy with radiation   BREAST LUMPECTOMY,RADIO FREQ LOCALIZER,AXILLARY SENTINEL LYMPH NODE BIOPSY Left 07/10/2020   Procedure: BREAST LUMPECTOMY,RADIO FREQ LOCALIZER,AXILLARY SENTINEL LYMPH NODE BIOPSY;  Surgeon: Jules Husbands, MD;  Location: [facility] ORS;  Service: General;  Laterality: Left;   COLONOSCOPY WITH PROPOFOL N/A 10/18/2015   Procedure: COLONOSCOPY WITH PROPOFOL;  Surgeon: Manya Silvas, MD;  Location: [facility] ENDOSCOPY;  Service: Endoscopy;  Laterality: N/A;   COLONOSCOPY WITH PROPOFOL N/A 08/19/2021   Procedure: COLONOSCOPY WITH PROPOFOL;  Surgeon: [name], MD;  Location: [facility] ENDOSCOPY;  Service: Endoscopy;  Laterality: N/A;   INGUINAL HERNIA REPAIR     age 62's   POLYPECTOMY      Medical History: Past Medical History:  Diagnosis Date   Allergy    Arthritis    Breast cancer ([place])    Diabetes mellitus without complication ([place])    Hyperlipidemia    Hypertension    Personal history of radiation therapy    Seasonal allergies     Family History: Family History  Problem Relation Age of Onset   Cancer Brother    Breast cancer Paternal  Aunt     Social History   Socioeconomic History   Marital status: Divorced    Spouse name: Not on file   Number of children: Not on file   Years of education: Not on file   Highest education level: Not on file  Occupational History   Not on file  Tobacco Use   Smoking status: Former    Types: Cigarettes    Quit date: 11/2007    Years since quitting: 15.1   Smokeless tobacco: Never  Vaping Use   Vaping Use: Never used  Substance and Sexual Activity   Alcohol use: Not Currently    Alcohol/week: 0.0 standard drinks of alcohol   Drug use: No   Sexual activity: Not Currently   Other Topics Concern   Not on file  Social History Narrative   Not on file   Social Determinants of Health   Financial Resource Strain: Not on file  Food Insecurity: Not on file  Transportation Needs: Not on file  Physical Activity: Not on file  Stress: Not on file  Social Connections: Not on file  Intimate Partner Violence: Not on file      Review of Systems  Constitutional:  Negative for appetite change, fatigue and fever.  HENT:  Positive for congestion, postnasal drip and rhinorrhea. Negative for mouth sores.   Respiratory: Negative.  Negative for cough, chest tightness and shortness of breath.   Cardiovascular: Negative.  Negative for chest pain and palpitations.  Gastrointestinal: Negative.   Genitourinary: Negative.  Negative for flank pain.  Musculoskeletal:  Positive for arthralgias.  Neurological: Negative.  Negative for headaches.  Psychiatric/Behavioral:  Negative for behavioral problems, self-injury, sleep disturbance and suicidal ideas. The patient is nervous/anxious.     Vital Signs: BP 135/80   Pulse 100   Temp 98.3 F (36.8 C)   Resp 16   Ht '5\' 4"'$  (1.626 m)   Wt 136 lb 12.8 oz (62.1 kg)   SpO2 95%   BMI 23.48 kg/m    Physical Exam Vitals reviewed.  Constitutional:      General: She is not in acute distress.    Appearance: Normal appearance. She is normal weight. She is not ill-appearing.  HENT:     Head: Normocephalic and atraumatic.  Eyes:     Pupils: Pupils are equal, round, and reactive to light.  Cardiovascular:     Rate and Rhythm: Normal rate and regular rhythm.  Pulmonary:     Effort: Pulmonary effort is normal. No respiratory distress.  Neurological:     Mental Status: She is alert and oriented to person, place, and time.     Cranial Nerves: No cranial nerve deficit.     Coordination: Coordination normal.     Gait: Gait normal.  Psychiatric:        Mood and Affect: Affect normal. Mood is anxious.        Behavior: Behavior  normal. Behavior is cooperative.        Assessment/Plan: 1. Allergic rhinitis with postnasal drip Continue OTC claritin. May add flonase nasal spray as discussed but also continue daily use of saline nasal spray to prevent significant dryness in the nasal passage which would increase the chance of having nose bleeds again.   2. Essential hypertension Continue current medications as prescribed. No changes.   3. Hypokalemia Repeat potassium level - Potassium  4. Need for prophylactic vaccination and inoculation against respiratory syncytial virus (RSV) - RSV vaccine recomb adjuvanted (AREXVY) 120 MCG/0.5ML injection; Inject 0.5  mLs into the muscle once for 1 dose.  Dispense: 0.5 mL; Refill: 0   General Counseling: Kristee verbalizes understanding of the findings of todays visit and agrees with plan of treatment. I have discussed any further diagnostic evaluation that may be needed or ordered today. We also reviewed her medications today. she has been encouraged to call the office with any questions or concerns that should arise related to todays visit.    Orders Placed This Encounter  Procedures   Potassium    Meds ordered this encounter  Medications   RSV vaccine recomb adjuvanted (AREXVY) 120 MCG/0.5ML injection    Sig: Inject 0.5 mLs into the muscle once for 1 dose.    Dispense:  0.5 mL    Refill:  0    Return for upcoming annual wellness visit in june.   Total time spent:30 Minutes Time spent includes review of chart, medications, test results, and follow up plan with the patient.   [place] Controlled Substance Database was reviewed by me.  This patient was seen by Jonetta Osgood, FNP-C in collaboration with Dr. Clayborn Bigness as a part of collaborative care agreement.   Chany Woolworth R. Valetta Fuller, MSN, FNP-C Internal medicine

## 2023-01-05 DIAGNOSIS — M4726 Other spondylosis with radiculopathy, lumbar region: Secondary | ICD-10-CM | POA: Diagnosis not present

## 2023-01-07 DIAGNOSIS — M4726 Other spondylosis with radiculopathy, lumbar region: Secondary | ICD-10-CM | POA: Diagnosis not present

## 2023-01-12 DIAGNOSIS — M4726 Other spondylosis with radiculopathy, lumbar region: Secondary | ICD-10-CM | POA: Diagnosis not present

## 2023-01-15 ENCOUNTER — Ambulatory Visit (INDEPENDENT_AMBULATORY_CARE_PROVIDER_SITE_OTHER): Payer: 59 | Admitting: Physician Assistant

## 2023-01-15 ENCOUNTER — Encounter: Payer: Self-pay | Admitting: Physician Assistant

## 2023-01-15 ENCOUNTER — Other Ambulatory Visit
Admission: RE | Admit: 2023-01-15 | Discharge: 2023-01-15 | Disposition: A | Discharge status: Home / Self Care | Payer: 59 | Source: Home / Self Care | Attending: Nurse Practitioner | Admitting: Nurse Practitioner

## 2023-01-15 ENCOUNTER — Ambulatory Visit
Admission: RE | Admit: 2023-01-15 | Discharge: 2023-01-15 | Disposition: A | Discharge status: Home / Self Care | Payer: 59 | Attending: Physician Assistant | Admitting: Physician Assistant

## 2023-01-15 ENCOUNTER — Ambulatory Visit
Admission: RE | Admit: 2023-01-15 | Discharge: 2023-01-15 | Disposition: A | Discharge status: Home / Self Care | Payer: 59 | Source: Ambulatory Visit | Attending: Physician Assistant | Admitting: Physician Assistant

## 2023-01-15 VITALS — BP 170/93 | HR 103 | Ht 64.0 in | Wt 134.0 lb

## 2023-01-15 DIAGNOSIS — R310 Gross hematuria: Secondary | ICD-10-CM | POA: Diagnosis not present

## 2023-01-15 DIAGNOSIS — N200 Calculus of kidney: Secondary | ICD-10-CM | POA: Diagnosis not present

## 2023-01-15 LAB — URINALYSIS, COMPLETE
Bilirubin, UA: NEGATIVE
Glucose, UA: NEGATIVE
Ketones, UA: NEGATIVE
Nitrite, UA: NEGATIVE
Protein,UA: NEGATIVE
Specific Gravity, UA: 1.01 (ref 1.005–1.030)
Urobilinogen, Ur: 0.2 mg/dL (ref 0.2–1.0)
pH, UA: 7 (ref 5.0–7.5)

## 2023-01-15 LAB — MICROSCOPIC EXAMINATION: RBC, Urine: >30 /hpf — AB (ref 0–2)

## 2023-01-15 LAB — POTASSIUM: Potassium: 4 mmol/L (ref 3.5–5.1)

## 2023-01-15 MED ORDER — NITROFURANTOIN MONOHYD MACRO 100 MG PO CAPS: 100.0000 mg | ORAL_CAPSULE | Freq: Two times a day (BID) | ORAL | 0 refills | Status: AC

## 2023-01-15 NOTE — Progress Notes (Signed)
01/15/2023 1:53 PM   Virginia Warner 1946/04/01 [id number]  CC: Chief Complaint  Patient presents with   Acute Visit   Hematuria   HPI: Virginia Warner is a 77 y.o. female with PMH gross hematuria with benign workup in 2022 and nonobstructing left renal calculi who presents today for evaluation of gross hematuria.   Today she reports a return of her gross hematuria yesterday.  She has had some mild dysuria this morning.  She denies flank pain, fever, chills, nausea, or vomiting.  She noticed a fleck of hard material in her underwear and wonders if she may have passed a stone.  In-office UA today positive for 3+ blood and 2+ leukocytes; urine microscopy with 11-30 WBCs/HPF, >30 RBCs/HPF, and moderate bacteria.   PMH: Past Medical History:  Diagnosis Date   Allergy    Arthritis    Breast cancer ([place])    Diabetes mellitus without complication ([place])    Hyperlipidemia    Hypertension    Personal history of radiation therapy    Seasonal allergies     Surgical History: Past Surgical History:  Procedure Laterality Date   ABDOMINAL HYSTERECTOMY     partial   BREAST BIOPSY Left 06/12/2020   [place] Bx, Ribbon Clip, [facility]   BREAST LUMPECTOMY Left 07/04/2020   [facility] lumpectomy with radiation   BREAST LUMPECTOMY,RADIO FREQ LOCALIZER,AXILLARY SENTINEL LYMPH NODE BIOPSY Left 07/10/2020   Procedure: BREAST LUMPECTOMY,RADIO FREQ LOCALIZER,AXILLARY SENTINEL LYMPH NODE BIOPSY;  Surgeon: Jules Husbands, MD;  Location: [facility] ORS;  Service: General;  Laterality: Left;   COLONOSCOPY WITH PROPOFOL N/A 10/18/2015   Procedure: COLONOSCOPY WITH PROPOFOL;  Surgeon: Manya Silvas, MD;  Location: [facility];  Service: Endoscopy;  Laterality: N/A;   COLONOSCOPY WITH PROPOFOL N/A 08/19/2021   Procedure: COLONOSCOPY WITH PROPOFOL;  Surgeon: [name], MD;  Location: [facility] ENDOSCOPY;  Service: Endoscopy;  Laterality: N/A;   INGUINAL HERNIA REPAIR     age 68's   POLYPECTOMY      Home  Medications:  Allergies as of 01/15/2023       Reactions   Aspirin Nausea And Vomiting   Cannot tolerate unless enteric coated   Atorvastatin Other (See Comments)   Bone pain        Medication List        Accurate as of January 15, 2023  1:53 PM. If you have any questions, ask your nurse or doctor.          acetaminophen 500 MG tablet Commonly known as: TYLENOL Take 500 mg by mouth daily as needed for moderate pain.   alendronate 70 MG tablet Commonly known as: FOSAMAX TAKE 1 TABLET EVERY 7 DAYS WITH A FULL GLASS OF WATER ON AN EMPTY STOMACH DO NOT LIE DOWN FOR AT LEAST 30 MIN   aspirin EC 81 MG tablet Take 81 mg by mouth every other day.   diltiazem 120 MG 24 hr capsule Commonly known as: CARDIZEM CD Take 1 capsule (120 mg total) by mouth daily.   gabapentin 100 MG capsule Commonly known as: NEURONTIN TAKE ONE CAPSULE BY MOUTH EVERY MORNING,ONE CAPSULE IN THE AFTERNOON, AND 3 CAPSULES AT BEDTIME.   latanoprost 0.005 % ophthalmic solution Commonly known as: XALATAN 1 drop at bedtime.   letrozole 2.5 MG tablet Commonly known as: FEMARA TAKE 1 TABLET BY MOUTH DAILY   lisinopril 20 MG tablet Commonly known as: ZESTRIL TAKE ONE TABLET BY MOUTH EVERY MORNING AND ONE IN EVENING   meloxicam 15  MG tablet Commonly known as: MOBIC Take 15 mg by mouth daily.   multivitamin with minerals Tabs tablet Take 1 tablet by mouth daily.   nitrofurantoin (macrocrystal-monohydrate) 100 MG capsule Commonly known as: MACROBID Take 1 capsule (100 mg total) by mouth 2 (two) times daily for 5 days. Started by: Debroah Loop, PA-C   predniSONE 5 MG tablet Commonly known as: DELTASONE Take by mouth.        Allergies:  Allergies  Allergen Reactions   Aspirin Nausea And Vomiting    Cannot tolerate unless enteric coated   Atorvastatin Other (See Comments)    Bone pain    Family History: Family History  Problem Relation Age of Onset   Cancer Brother     Breast cancer Paternal Aunt     Social History:   reports that she quit smoking about 15 years ago. Her smoking use included cigarettes. She has been exposed to tobacco smoke. She has never used smokeless tobacco. She reports that she does not currently use alcohol. She reports that she does not use drugs.  Physical Exam: BP (!) 170/93   Pulse (!) 103   Ht '5\' 4"'$  (1.626 m)   Wt 134 lb (60.8 kg)   BMI 23.00 kg/m   Constitutional:  Alert and oriented, no acute distress, nontoxic appearing HEENT: [place], AT Cardiovascular: No clubbing, cyanosis, or edema Respiratory: Normal respiratory effort, no increased work of breathing Skin: No rashes, bruises or suspicious lesions Neurologic: Grossly intact, no focal deficits, moving all 4 extremities Psychiatric: Normal mood and affect  Laboratory Data: Results for orders placed or performed in visit on 01/15/23  Microscopic Examination   Urine  Result Value Ref Range   WBC, UA 11-30 (A) 0 - 5 /hpf   RBC, Urine >30 (A) 0 - 2 /hpf   Epithelial Cells (non renal) 0-10 0 - 10 /hpf   Bacteria, UA Moderate (A) None seen/Few  Urinalysis, Complete  Result Value Ref Range   Specific Gravity, UA 1.010 1.005 - 1.030   pH, UA 7.0 5.0 - 7.5   Color, UA Yellow Yellow   Appearance Ur Hazy (A) Clear   Leukocytes,UA 2+ (A) Negative   Protein,UA Negative Negative/Trace   Glucose, UA Negative Negative   Ketones, UA Negative Negative   RBC, UA 3+ (A) Negative   Bilirubin, UA Negative Negative   Urobilinogen, Ur 0.2 0.2 - 1.0 mg/dL   Nitrite, UA Negative Negative   Microscopic Examination See below:    Assessment & Plan:   1. Gross hematuria UA today with microscopic hematuria, pyuria, and bacteriuria consistent with possible UTI.  Will start empiric Macrobid and send for culture for further evaluation.  Will send her a possible stone for analysis as well and have her repeat a KUB today in case her symptoms represent an acute stone episode.  Will call  her with her results and determine next steps based on culture and KUB.  May consider repeat cystoscopy versus CTU if no significant findings. - Urinalysis, Complete - CULTURE, URINE COMPREHENSIVE - DG Abd 1 View; Future - Calculi, with Photograph (to Clinical Lab) - nitrofurantoin, macrocrystal-monohydrate, (MACROBID) 100 MG capsule; Take 1 capsule (100 mg total) by mouth 2 (two) times daily for 5 days.  Dispense: 10 capsule; Refill: 0   Return for Will call with results.  Debroah Loop, PA-C  [facility] [address] [place], [place] [address] [phone]

## 2023-01-15 NOTE — Progress Notes (Signed)
Please let the patient know that her potassium level is normal now

## 2023-01-15 NOTE — Patient Instructions (Signed)
Go get your X-ray in the medical mall on your way home today. Go pick up your antibiotics and start them today. I will call you with your test results when I get them back.

## 2023-01-19 LAB — CULTURE, URINE COMPREHENSIVE

## 2023-01-20 ENCOUNTER — Other Ambulatory Visit: Payer: Self-pay | Admitting: Oncology

## 2023-01-20 ENCOUNTER — Telehealth: Payer: Self-pay | Admitting: Physician Assistant

## 2023-01-20 DIAGNOSIS — M4726 Other spondylosis with radiculopathy, lumbar region: Secondary | ICD-10-CM | POA: Diagnosis not present

## 2023-01-20 NOTE — Telephone Encounter (Signed)
Patient called with questions regarding lab results that she saw on mychart. She would like to speak with someone to interpret the results for her.

## 2023-01-21 NOTE — Telephone Encounter (Signed)
Advised patient that the urine culture was negative, pt asking if that was a stone or not. I advised that we would all once the stone analysis is done.

## 2023-01-23 DIAGNOSIS — M4726 Other spondylosis with radiculopathy, lumbar region: Secondary | ICD-10-CM | POA: Diagnosis not present

## 2023-01-26 DIAGNOSIS — M4726 Other spondylosis with radiculopathy, lumbar region: Secondary | ICD-10-CM | POA: Diagnosis not present

## 2023-01-30 NOTE — Telephone Encounter (Signed)
Patient called to follow up on stone results. This has not been resulted yet. I spoke with Sharyn Lull in the lab and below is the information in regards to this: "Kidney Stones are processed by our [place] branch in [place], and there were some shipping delays and the stone did not arrive at [facility] until 01/24, I was assured that Ryther's stone was in process and would be finaled by end of day tomorrow. "  FYI to [facility]

## 2023-02-02 ENCOUNTER — Other Ambulatory Visit: Payer: Self-pay | Admitting: Nurse Practitioner

## 2023-02-02 DIAGNOSIS — M4726 Other spondylosis with radiculopathy, lumbar region: Secondary | ICD-10-CM | POA: Diagnosis not present

## 2023-02-02 DIAGNOSIS — I10 Essential (primary) hypertension: Secondary | ICD-10-CM

## 2023-02-02 LAB — STONE ANALYSIS
Calcium Phosphate (Hydroxyl): 100 %
Weight Calculi: 1 mg

## 2023-02-02 NOTE — Telephone Encounter (Signed)
Monica from [facility] completed an annual visit with patient and called to report that she performed a PAD screening. I left [name] know about the results: left foot, 0.77 mild and right foot, 0.90 borderline.

## 2023-02-04 ENCOUNTER — Telehealth: Payer: Self-pay | Admitting: Family Medicine

## 2023-02-04 NOTE — Telephone Encounter (Signed)
Results received, see result notes.

## 2023-02-04 NOTE — Telephone Encounter (Signed)
Patient notified and voiced understanding. She is doing much better.

## 2023-02-04 NOTE — Telephone Encounter (Signed)
-----   Message from Debroah Loop, [place] sent at 02/04/2023  9:02 AM EST ----- It looks like the fragment she brought to clinic really was a kidney stone. It was made of calcium phosphate, which is the second most common type of kidney stone. Passing a stone could certainly explain the blood in her urine. Is she still having blood in her urine? If so, we may need to consider additional workup; otherwise okay to follow up as scheduled.

## 2023-02-09 ENCOUNTER — Ambulatory Visit (INDEPENDENT_AMBULATORY_CARE_PROVIDER_SITE_OTHER): Payer: 59 | Admitting: Nurse Practitioner

## 2023-02-09 ENCOUNTER — Encounter: Payer: Self-pay | Admitting: Nurse Practitioner

## 2023-02-09 VITALS — BP 120/70 | HR 100 | Temp 97.1°F | Resp 16 | Ht 64.0 in | Wt 134.6 lb

## 2023-02-09 DIAGNOSIS — G5793 Unspecified mononeuropathy of bilateral lower limbs: Secondary | ICD-10-CM

## 2023-02-09 DIAGNOSIS — M170 Bilateral primary osteoarthritis of knee: Secondary | ICD-10-CM | POA: Diagnosis not present

## 2023-02-09 DIAGNOSIS — I739 Peripheral vascular disease, unspecified: Secondary | ICD-10-CM

## 2023-02-09 MED ORDER — CILOSTAZOL 50 MG PO TABS: 50.0000 mg | ORAL_TABLET | Freq: Two times a day (BID) | ORAL | 0 refills | Status: DC

## 2023-02-09 NOTE — Progress Notes (Signed)
[facility] [place], [place] [address]  Internal MEDICINE  Office Visit Note  Patient Name: Virginia Warner  [id number]  [id number]  Date of Service: 02/09/2023  Chief Complaint  Patient presents with   Follow-up    HPI Virginia Warner presents for an office visit to discuss [employer] results.  She had an NP come to her house from [employer] to do a wellness check  They performed testing for ankle-brachial index and her was slightly abnormal indicating mild PAD.  We discussed medication to help with the intermittent claudication and burning leg pain in her calf and/or referred to vascular surgery.  She also worries about falling because her gait becomes unsteady sometimes when she has leg pain. She has a cane but if the cane is not adequate, she would like to have a walker available as well.     Current Medication: Outpatient Encounter Medications as of 02/09/2023  Medication Sig   acetaminophen (TYLENOL) 500 MG tablet Take 500 mg by mouth daily as needed for moderate pain.    alendronate (FOSAMAX) 70 MG tablet TAKE 1 TABLET EVERY 7 DAYS WITH A FULL GLASS OF WATER ON AN EMPTY STOMACH DO NOT LIE DOWN FOR AT LEAST 30 MIN   aspirin EC 81 MG tablet Take 81 mg by mouth every other day.   cilostazol (PLETAL) 50 MG tablet Take 1 tablet (50 mg total) by mouth 2 (two) times daily.   diltiazem (CARDIZEM CD) 120 MG 24 hr capsule TAKE 1 CAPSULE BY MOUTH EVERY DAY   gabapentin (NEURONTIN) 100 MG capsule TAKE ONE CAPSULE BY MOUTH EVERY MORNING,ONE CAPSULE IN THE AFTERNOON, AND 3 CAPSULES AT BEDTIME.   latanoprost (XALATAN) 0.005 % ophthalmic solution 1 drop at bedtime.   letrozole (FEMARA) 2.5 MG tablet TAKE 1 TABLET BY MOUTH DAILY   lisinopril (ZESTRIL) 20 MG tablet TAKE ONE TABLET BY MOUTH EVERY MORNING AND ONE IN EVENING   meloxicam (MOBIC) 15 MG tablet Take 15 mg by mouth daily.   Multiple Vitamin (MULTIVITAMIN WITH MINERALS) TABS tablet Take 1 tablet by mouth daily.     predniSONE (DELTASONE) 5 MG tablet Take by mouth.   No facility-administered encounter medications on file as of 02/09/2023.    Surgical History: Past Surgical History:  Procedure Laterality Date   ABDOMINAL HYSTERECTOMY     partial   BREAST BIOPSY Left 06/12/2020   [place] Bx, Ribbon Clip, [facility]   BREAST LUMPECTOMY Left 07/04/2020   [facility] lumpectomy with radiation   BREAST LUMPECTOMY,RADIO FREQ LOCALIZER,AXILLARY SENTINEL LYMPH NODE BIOPSY Left 07/10/2020   Procedure: BREAST LUMPECTOMY,RADIO FREQ LOCALIZER,AXILLARY SENTINEL LYMPH NODE BIOPSY;  Surgeon: Jules Husbands, MD;  Location: [facility] ORS;  Service: General;  Laterality: Left;   COLONOSCOPY WITH PROPOFOL N/A 10/18/2015   Procedure: COLONOSCOPY WITH PROPOFOL;  Surgeon: Manya Silvas, MD;  Location: [facility] ENDOSCOPY;  Service: Endoscopy;  Laterality: N/A;   COLONOSCOPY WITH PROPOFOL N/A 08/19/2021   Procedure: COLONOSCOPY WITH PROPOFOL;  Surgeon: [name], MD;  Location: [facility] ENDOSCOPY;  Service: Endoscopy;  Laterality: N/A;   INGUINAL HERNIA REPAIR     age 43's   POLYPECTOMY      Medical History: Past Medical History:  Diagnosis Date   Allergy    Arthritis    Breast cancer ([place])    Diabetes mellitus without complication ([place])    Hyperlipidemia    Hypertension    Personal history of radiation therapy    Seasonal allergies     Family History:  Family History  Problem Relation Age of Onset   Cancer Brother    Breast cancer Paternal Aunt     Social History   Socioeconomic History   Marital status: Divorced    Spouse name: Not on file   Number of children: Not on file   Years of education: Not on file   Highest education level: Not on file  Occupational History   Not on file  Tobacco Use   Smoking status: Former    Types: Cigarettes    Quit date: 11/2007    Years since quitting: 15.2    Passive exposure: Past   Smokeless tobacco: Never  Vaping Use   Vaping Use: Never used  Substance and Sexual  Activity   Alcohol use: Not Currently    Alcohol/week: 0.0 standard drinks of alcohol   Drug use: No   Sexual activity: Not Currently  Other Topics Concern   Not on file  Social History Narrative   Not on file   Social Determinants of Health   Financial Resource Strain: Not on file  Food Insecurity: Not on file  Transportation Needs: Not on file  Physical Activity: Not on file  Stress: Not on file  Social Connections: Not on file  Intimate Partner Violence: Not on file      Review of Systems  Constitutional:  Negative for appetite change, fatigue and fever.  HENT:  Negative for congestion, mouth sores and postnasal drip.   Respiratory: Negative.  Negative for cough, chest tightness and shortness of breath.   Cardiovascular: Negative.  Negative for chest pain and palpitations.  Gastrointestinal: Negative.   Genitourinary: Negative.  Negative for flank pain.  Musculoskeletal:  Positive for arthralgias, gait problem and myalgias.  Neurological:  Positive for weakness. Negative for headaches.  Psychiatric/Behavioral:  Negative for behavioral problems, self-injury, sleep disturbance and suicidal ideas. The patient is nervous/anxious.     Vital Signs: BP 120/70   Pulse 100   Temp (!) 97.1 F (36.2 C)   Resp 16   Ht 5' 4"$  (1.626 m)   Wt 134 lb 9.6 oz (61.1 kg)   SpO2 96%   BMI 23.10 kg/m    Physical Exam Vitals reviewed.  Constitutional:      Appearance: Normal appearance.  HENT:     Head: Normocephalic and atraumatic.  Eyes:     Pupils: Pupils are equal, round, and reactive to light.  Cardiovascular:     Rate and Rhythm: Normal rate and regular rhythm.  Pulmonary:     Effort: Pulmonary effort is normal. No respiratory distress.  Neurological:     Mental Status: She is alert and oriented to person, place, and time.  Psychiatric:        Mood and Affect: Mood normal.        Behavior: Behavior normal.        Assessment/Plan: 1. Peripheral arterial disease  (HCC) Abnormal ABI, need further evaluation, referred to vascular surgery. Also need walker available in the home.  - For home use only DME 4 wheeled rolling walker with seat [id number] - Ambulatory referral to Vascular Surgery  2. Neuropathy involving both lower extremities Walker ordered - For home use only DME 4 wheeled rolling walker with seat [id number])  3. Osteoarthritis of lower legs, bilateral Walker ordered - For home use only DME 4 wheeled rolling walker with seat [id number])  4. Intermittent claudication (HCC) Referred to vascular surgery, declined medication for now.  - Ambulatory referral to Vascular Surgery   General  Counseling: amalie asay understanding of the findings of todays visit and agrees with plan of treatment. I have discussed any further diagnostic evaluation that may be needed or ordered today. We also reviewed her medications today. she has been encouraged to call the office with any questions or concerns that should arise related to todays visit.    Orders Placed This Encounter  Procedures   For home use only DME 4 wheeled rolling walker with seat [id number])    Meds ordered this encounter  Medications   cilostazol (PLETAL) 50 MG tablet    Sig: Take 1 tablet (50 mg total) by mouth 2 (two) times daily.    Dispense:  60 tablet    Refill:  0    Return in about 3 weeks (around 03/02/2023) for F/U, eval new med, [place] PCP.   Total time spent:30 Minutes Time spent includes review of chart, medications, test results, and follow up plan with the patient.   [place] Controlled Substance Database was reviewed by me.  This patient was seen by Jonetta Osgood, FNP-C in collaboration with Dr. Clayborn Bigness as a part of collaborative care agreement.   Dmitriy Gair R. Valetta Fuller, MSN, FNP-C Internal medicine

## 2023-02-10 ENCOUNTER — Telehealth: Payer: Self-pay | Admitting: Nurse Practitioner

## 2023-02-10 NOTE — Telephone Encounter (Signed)
Last office note, medication list and next appointment faxed to [facility]; [phone]

## 2023-02-11 DIAGNOSIS — M25562 Pain in left knee: Secondary | ICD-10-CM | POA: Diagnosis not present

## 2023-02-11 DIAGNOSIS — M25552 Pain in left hip: Secondary | ICD-10-CM | POA: Diagnosis not present

## 2023-02-11 DIAGNOSIS — G8929 Other chronic pain: Secondary | ICD-10-CM | POA: Diagnosis not present

## 2023-02-11 DIAGNOSIS — R040 Epistaxis: Secondary | ICD-10-CM | POA: Diagnosis not present

## 2023-02-11 DIAGNOSIS — M5416 Radiculopathy, lumbar region: Secondary | ICD-10-CM | POA: Diagnosis not present

## 2023-02-18 ENCOUNTER — Encounter: Payer: Self-pay | Admitting: Nurse Practitioner

## 2023-02-19 ENCOUNTER — Telehealth: Payer: Self-pay

## 2023-02-19 NOTE — Telephone Encounter (Signed)
Faxed [facility] for walker

## 2023-02-23 DIAGNOSIS — G8929 Other chronic pain: Secondary | ICD-10-CM | POA: Diagnosis not present

## 2023-02-23 DIAGNOSIS — M25511 Pain in right shoulder: Secondary | ICD-10-CM | POA: Diagnosis not present

## 2023-02-23 DIAGNOSIS — M19111 Post-traumatic osteoarthritis, right shoulder: Secondary | ICD-10-CM | POA: Diagnosis not present

## 2023-02-26 ENCOUNTER — Telehealth: Payer: Self-pay

## 2023-02-26 NOTE — Telephone Encounter (Signed)
Faxed paper to [facility] with additional form

## 2023-03-02 ENCOUNTER — Telehealth: Payer: Self-pay | Admitting: Nurse Practitioner

## 2023-03-02 NOTE — Telephone Encounter (Addendum)
error 

## 2023-03-04 ENCOUNTER — Encounter: Payer: Self-pay | Admitting: Nurse Practitioner

## 2023-03-04 ENCOUNTER — Ambulatory Visit (INDEPENDENT_AMBULATORY_CARE_PROVIDER_SITE_OTHER): Payer: 59 | Admitting: Nurse Practitioner

## 2023-03-04 VITALS — BP 154/73 | HR 75 | Temp 98.2°F | Resp 16 | Ht 64.0 in | Wt 136.8 lb

## 2023-03-04 DIAGNOSIS — G5793 Unspecified mononeuropathy of bilateral lower limbs: Secondary | ICD-10-CM

## 2023-03-04 DIAGNOSIS — M170 Bilateral primary osteoarthritis of knee: Secondary | ICD-10-CM | POA: Diagnosis not present

## 2023-03-04 DIAGNOSIS — I10 Essential (primary) hypertension: Secondary | ICD-10-CM | POA: Diagnosis not present

## 2023-03-04 NOTE — Progress Notes (Signed)
[facility] [place], [place] [address]  Internal MEDICINE  Office Visit Note  Patient Name: Virginia Warner  [id number]  [id number]  Date of Service: 03/04/2023  Chief Complaint  Patient presents with   Follow-up    HPI Virginia Warner presents for a follow-up visit for lower leg pain Going to see vascular surgery soon for lower leg pain to rule out possible PAD/PVD. Had a home visit from nurse from insurance company and an ABI test was done and result was abnormal.   Has generalized osteoarthritis and neuropathy in her legs especially the left leg Will hold off on starting cilostazol for now and will only start this medication if vascular surgery finds evidence of PAD once they evaluate the patient.  BP elevated, home BP readings remain normal, still taking diltiazem and lisinopril   Current Medication: Outpatient Encounter Medications as of 03/04/2023  Medication Sig   acetaminophen (TYLENOL) 500 MG tablet Take 500 mg by mouth daily as needed for moderate pain.    alendronate (FOSAMAX) 70 MG tablet TAKE 1 TABLET EVERY 7 DAYS WITH A FULL GLASS OF WATER ON AN EMPTY STOMACH DO NOT LIE DOWN FOR AT LEAST 30 MIN   aspirin EC 81 MG tablet Take 81 mg by mouth every other day.   diltiazem (CARDIZEM CD) 120 MG 24 hr capsule TAKE 1 CAPSULE BY MOUTH EVERY DAY   gabapentin (NEURONTIN) 100 MG capsule TAKE ONE CAPSULE BY MOUTH EVERY MORNING,ONE CAPSULE IN THE AFTERNOON, AND 3 CAPSULES AT BEDTIME.   latanoprost (XALATAN) 0.005 % ophthalmic solution 1 drop at bedtime.   letrozole (FEMARA) 2.5 MG tablet TAKE 1 TABLET BY MOUTH DAILY   lisinopril (ZESTRIL) 20 MG tablet TAKE ONE TABLET BY MOUTH EVERY MORNING AND ONE IN EVENING   meloxicam (MOBIC) 15 MG tablet Take 15 mg by mouth daily.   Multiple Vitamin (MULTIVITAMIN WITH MINERALS) TABS tablet Take 1 tablet by mouth daily.    predniSONE (DELTASONE) 5 MG tablet Take by mouth.   No facility-administered encounter medications on file as  of 03/04/2023.    Surgical History: Past Surgical History:  Procedure Laterality Date   ABDOMINAL HYSTERECTOMY     partial   BREAST BIOPSY Left 06/12/2020   [place] Bx, Ribbon Clip, [facility]   BREAST LUMPECTOMY Left 07/04/2020   [facility] lumpectomy with radiation   BREAST LUMPECTOMY,RADIO FREQ LOCALIZER,AXILLARY SENTINEL LYMPH NODE BIOPSY Left 07/10/2020   Procedure: BREAST LUMPECTOMY,RADIO FREQ LOCALIZER,AXILLARY SENTINEL LYMPH NODE BIOPSY;  Surgeon: Jules Husbands, MD;  Location: [facility] ORS;  Service: General;  Laterality: Left;   COLONOSCOPY WITH PROPOFOL N/A 10/18/2015   Procedure: COLONOSCOPY WITH PROPOFOL;  Surgeon: Manya Silvas, MD;  Location: [facility] ENDOSCOPY;  Service: Endoscopy;  Laterality: N/A;   COLONOSCOPY WITH PROPOFOL N/A 08/19/2021   Procedure: COLONOSCOPY WITH PROPOFOL;  Surgeon: [name], MD;  Location: [facility] ENDOSCOPY;  Service: Endoscopy;  Laterality: N/A;   INGUINAL HERNIA REPAIR     age 35's   POLYPECTOMY      Medical History: Past Medical History:  Diagnosis Date   Allergy    Arthritis    Breast cancer ([place])    Diabetes mellitus without complication ([place])    Hyperlipidemia    Hypertension    Personal history of radiation therapy    Seasonal allergies     Family History: Family History  Problem Relation Age of Onset   Cancer Brother    Breast cancer Paternal Aunt     Social History  Socioeconomic History   Marital status: Divorced    Spouse name: Not on file   Number of children: Not on file   Years of education: Not on file   Highest education level: Not on file  Occupational History   Not on file  Tobacco Use   Smoking status: Former    Types: Cigarettes    Quit date: 11/2007    Years since quitting: 15.2    Passive exposure: Past   Smokeless tobacco: Never  Vaping Use   Vaping Use: Never used  Substance and Sexual Activity   Alcohol use: Not Currently    Alcohol/week: 0.0 standard drinks of alcohol   Drug use: No   Sexual activity:  Not Currently  Other Topics Concern   Not on file  Social History Narrative   Not on file   Social Determinants of Health   Financial Resource Strain: Not on file  Food Insecurity: Not on file  Transportation Needs: Not on file  Physical Activity: Not on file  Stress: Not on file  Social Connections: Not on file  Intimate Partner Violence: Not on file      Review of Systems  Constitutional:  Negative for appetite change, fatigue and fever.  HENT:  Negative for congestion, mouth sores and postnasal drip.   Respiratory: Negative.  Negative for cough, chest tightness and shortness of breath.   Cardiovascular: Negative.  Negative for chest pain and palpitations.  Gastrointestinal: Negative.   Genitourinary: Negative.  Negative for flank pain.  Musculoskeletal:  Positive for arthralgias, gait problem and myalgias.  Neurological:  Positive for weakness. Negative for headaches.  Psychiatric/Behavioral:  Negative for behavioral problems, self-injury, sleep disturbance and suicidal ideas. The patient is nervous/anxious.     Vital Signs: BP (!) 154/73   Pulse 75   Temp 98.2 F (36.8 C)   Resp 16   Ht 5\' 4"  (1.626 m)   Wt 136 lb 12.8 oz (62.1 kg)   SpO2 97%   BMI 23.48 kg/m    Physical Exam Vitals reviewed.  Constitutional:      Appearance: Normal appearance.  HENT:     Head: Normocephalic and atraumatic.  Eyes:     Pupils: Pupils are equal, round, and reactive to light.  Cardiovascular:     Rate and Rhythm: Normal rate and regular rhythm.  Pulmonary:     Effort: Pulmonary effort is normal. No respiratory distress.  Neurological:     Mental Status: She is alert and oriented to person, place, and time.  Psychiatric:        Mood and Affect: Mood normal.        Behavior: Behavior normal.        Assessment/Plan: 1. Neuropathy involving both lower extremities On gabapentin, this does help the pain. Continue as prescribed.   2. Osteoarthritis of lower legs,  bilateral Continue meloxicam as prescribed.   3. Elevated blood pressure reading in office with white coat syndrome, with diagnosis of hypertension BP elevated in office, has white coat syndrome, per patient, home BP readings remain WNL.  Continue diltiazem and lisinopril as prescribed.    General Counseling: jynelle depaola understanding of the findings of todays visit and agrees with plan of treatment. I have discussed any further diagnostic evaluation that may be needed or ordered today. We also reviewed her medications today. she has been encouraged to call the office with any questions or concerns that should arise related to todays visit.    No orders of the defined  types were placed in this encounter.   No orders of the defined types were placed in this encounter.   Return for next appt is in june.   Total time spent:30 Minutes Time spent includes review of chart, medications, test results, and follow up plan with the patient.   [place] Controlled Substance Database was reviewed by me.  This patient was seen by Jonetta Osgood, FNP-C in collaboration with Dr. Clayborn Bigness as a part of collaborative care agreement.   Demyan Fugate R. Valetta Fuller, MSN, FNP-C Internal medicine

## 2023-03-11 ENCOUNTER — Telehealth: Payer: Self-pay | Admitting: Nurse Practitioner

## 2023-03-11 ENCOUNTER — Telehealth: Payer: Self-pay

## 2023-03-11 NOTE — Telephone Encounter (Signed)
Walker & seat attachment faxed to [facility]; [phone]

## 2023-03-11 NOTE — Telephone Encounter (Addendum)
Faxed to [facility] [phone]

## 2023-03-16 ENCOUNTER — Other Ambulatory Visit (INDEPENDENT_AMBULATORY_CARE_PROVIDER_SITE_OTHER): Payer: Self-pay | Admitting: Nurse Practitioner

## 2023-03-16 DIAGNOSIS — R9389 Abnormal findings on diagnostic imaging of other specified body structures: Secondary | ICD-10-CM

## 2023-03-17 ENCOUNTER — Other Ambulatory Visit: Payer: Self-pay | Admitting: Nurse Practitioner

## 2023-03-17 DIAGNOSIS — M810 Age-related osteoporosis without current pathological fracture: Secondary | ICD-10-CM

## 2023-03-18 ENCOUNTER — Telehealth: Payer: Self-pay

## 2023-03-18 DIAGNOSIS — M4126 Other idiopathic scoliosis, lumbar region: Secondary | ICD-10-CM | POA: Diagnosis not present

## 2023-03-18 DIAGNOSIS — M160 Bilateral primary osteoarthritis of hip: Secondary | ICD-10-CM | POA: Diagnosis not present

## 2023-03-18 DIAGNOSIS — M5416 Radiculopathy, lumbar region: Secondary | ICD-10-CM | POA: Diagnosis not present

## 2023-03-18 DIAGNOSIS — M170 Bilateral primary osteoarthritis of knee: Secondary | ICD-10-CM | POA: Diagnosis not present

## 2023-03-18 DIAGNOSIS — G8929 Other chronic pain: Secondary | ICD-10-CM | POA: Diagnosis not present

## 2023-03-18 DIAGNOSIS — M25552 Pain in left hip: Secondary | ICD-10-CM | POA: Diagnosis not present

## 2023-03-18 DIAGNOSIS — M5136 Other intervertebral disc degeneration, lumbar region: Secondary | ICD-10-CM | POA: Diagnosis not present

## 2023-03-18 NOTE — Telephone Encounter (Signed)
Received call from patient stated she was going to bring walker order to office since we had not faxed back yet and it has been too long to wait. I explained to her that I had faxed signed order back to [facility] on 03/11/23 and had received fax confirmation. I faxed to different # [phone]

## 2023-03-21 ENCOUNTER — Encounter: Payer: Self-pay | Admitting: Nurse Practitioner

## 2023-03-23 ENCOUNTER — Ambulatory Visit (INDEPENDENT_AMBULATORY_CARE_PROVIDER_SITE_OTHER): Payer: 59 | Admitting: Vascular Surgery

## 2023-03-23 ENCOUNTER — Ambulatory Visit (INDEPENDENT_AMBULATORY_CARE_PROVIDER_SITE_OTHER): Payer: 59

## 2023-03-23 ENCOUNTER — Encounter (INDEPENDENT_AMBULATORY_CARE_PROVIDER_SITE_OTHER): Payer: Self-pay | Admitting: Vascular Surgery

## 2023-03-23 VITALS — BP 153/95 | HR 87 | Resp 18 | Ht 63.0 in | Wt 136.2 lb

## 2023-03-23 DIAGNOSIS — E782 Mixed hyperlipidemia: Secondary | ICD-10-CM

## 2023-03-23 DIAGNOSIS — M79604 Pain in right leg: Secondary | ICD-10-CM | POA: Diagnosis not present

## 2023-03-23 DIAGNOSIS — K219 Gastro-esophageal reflux disease without esophagitis: Secondary | ICD-10-CM

## 2023-03-23 DIAGNOSIS — R9389 Abnormal findings on diagnostic imaging of other specified body structures: Secondary | ICD-10-CM

## 2023-03-23 DIAGNOSIS — I10 Essential (primary) hypertension: Secondary | ICD-10-CM | POA: Diagnosis not present

## 2023-03-23 DIAGNOSIS — M4726 Other spondylosis with radiculopathy, lumbar region: Secondary | ICD-10-CM | POA: Diagnosis not present

## 2023-03-23 DIAGNOSIS — M79605 Pain in left leg: Secondary | ICD-10-CM

## 2023-03-24 ENCOUNTER — Encounter (INDEPENDENT_AMBULATORY_CARE_PROVIDER_SITE_OTHER): Payer: Self-pay | Admitting: Vascular Surgery

## 2023-03-24 DIAGNOSIS — M79606 Pain in leg, unspecified: Secondary | ICD-10-CM | POA: Insufficient documentation

## 2023-03-24 DIAGNOSIS — M47816 Spondylosis without myelopathy or radiculopathy, lumbar region: Secondary | ICD-10-CM | POA: Insufficient documentation

## 2023-03-24 NOTE — Progress Notes (Signed)
MRN : [id number]  Virginia Warner is a 77 y.o. (1946-01-23) female who presents with chief complaint of check circulation.  History of Present Illness:   The patient is seen for evaluation of painful lower extremities. Patient notes the pain is variable and not always associated with activity.  The pain is somewhat consistent day to day occurring on most days. The patient notes the pain also occurs with standing and routinely seems worse as the day wears on. The pain has been progressive over the past several years. The patient states these symptoms are causing  a negative impact on quality of life and daily activities which was a factor in the referral.  She notes that the visiting nurse came to the house and performed a vascular test on her and was told that her pain is not secondary to her back troubles but are vascular.  The patient has a long history of back problems and DJD of the lumbar and sacral spine associated with sciatica and leg pain.   The patient denies rest pain or dangling of an extremity off the side of the bed during the night for relief. No open wounds or sores at this time. No history of DVT or phlebitis. No prior vascular interventions or surgeries.  ABIs are normal bilaterally   Current Meds  Medication Sig   acetaminophen (TYLENOL) 500 MG tablet Take 500 mg by mouth daily as needed for moderate pain.    alendronate (FOSAMAX) 70 MG tablet TAKE 1 TABLET EVERY 7 DAYS WITH A FULL GLASS OF WATER ON AN EMPTY STOMACH DO NOT LIE DOWN FOR AT LEAST 30 MIN   aspirin EC 81 MG tablet Take 81 mg by mouth every other day.   diltiazem (CARDIZEM CD) 120 MG 24 hr capsule TAKE 1 CAPSULE BY MOUTH EVERY DAY   gabapentin (NEURONTIN) 100 MG capsule TAKE ONE CAPSULE BY MOUTH EVERY MORNING,ONE CAPSULE IN THE AFTERNOON, AND 3 CAPSULES AT BEDTIME.   latanoprost (XALATAN) 0.005 % ophthalmic solution 1 drop at bedtime.   letrozole (FEMARA) 2.5 MG tablet TAKE 1 TABLET BY  MOUTH DAILY   lisinopril (ZESTRIL) 20 MG tablet TAKE ONE TABLET BY MOUTH EVERY MORNING AND ONE IN EVENING   meloxicam (MOBIC) 15 MG tablet Take 15 mg by mouth daily.   Multiple Vitamin (MULTIVITAMIN WITH MINERALS) TABS tablet Take 1 tablet by mouth daily.    predniSONE (DELTASONE) 5 MG tablet Take by mouth.    Past Medical History:  Diagnosis Date   Allergy    Arthritis    Breast cancer ([place])    Diabetes mellitus without complication ([place])    Hyperlipidemia    Hypertension    Personal history of radiation therapy    Seasonal allergies     Past Surgical History:  Procedure Laterality Date   ABDOMINAL HYSTERECTOMY     partial   BREAST BIOPSY Left 06/12/2020   [place] Bx, Ribbon Clip, [facility]   BREAST LUMPECTOMY Left 07/04/2020   [facility] lumpectomy with radiation   BREAST LUMPECTOMY,RADIO FREQ LOCALIZER,AXILLARY SENTINEL LYMPH NODE BIOPSY Left 07/10/2020   Procedure: BREAST LUMPECTOMY,RADIO FREQ LOCALIZER,AXILLARY SENTINEL LYMPH NODE BIOPSY;  Surgeon: Jules Husbands, MD;  Location: [facility] ORS;  Service: General;  Laterality: Left;   COLONOSCOPY WITH PROPOFOL N/A 10/18/2015   Procedure: COLONOSCOPY WITH PROPOFOL;  Surgeon: Manya Silvas, MD;  Location: [facility];  Service: Endoscopy;  Laterality: N/A;   COLONOSCOPY  WITH PROPOFOL N/A 08/19/2021   Procedure: COLONOSCOPY WITH PROPOFOL;  Surgeon: [name], MD;  Location: [facility] ENDOSCOPY;  Service: Endoscopy;  Laterality: N/A;   INGUINAL HERNIA REPAIR     age 44's   POLYPECTOMY      Social History Social History   Tobacco Use   Smoking status: Former    Types: Cigarettes    Quit date: 11/2007    Years since quitting: 15.3    Passive exposure: Past   Smokeless tobacco: Never  Vaping Use   Vaping Use: Never used  Substance Use Topics   Alcohol use: Not Currently    Alcohol/week: 0.0 standard drinks of alcohol   Drug use: No    Family History Family History  Problem Relation Age of Onset   Cancer Brother    Breast  cancer Paternal Aunt     Allergies  Allergen Reactions   Aspirin Nausea And Vomiting    Cannot tolerate unless enteric coated   Atorvastatin Other (See Comments)    Bone pain     REVIEW OF SYSTEMS (Negative unless checked)  Constitutional: [] Weight loss  [] Fever  [] Chills Cardiac: [] Chest pain   [] Chest pressure   [] Palpitations   [] Shortness of breath when laying flat   [] Shortness of breath with exertion. Vascular:  [x] Pain in legs with walking   [] Pain in legs at rest  [] History of DVT   [] Phlebitis   [] Swelling in legs   [] Varicose veins   [] Non-healing ulcers Pulmonary:   [] Uses home oxygen   [] Productive cough   [] Hemoptysis   [] Wheeze  [] COPD   [] Asthma Neurologic:  [] Dizziness   [] Seizures   [] History of stroke   [] History of TIA  [] Aphasia   [] Vissual changes   [] Weakness or numbness in arm   [] Weakness or numbness in leg Musculoskeletal:   [] Joint swelling   [] Joint pain   [] Low back pain Hematologic:  [] Easy bruising  [] Easy bleeding   [] Hypercoagulable state   [] Anemic Gastrointestinal:  [] Diarrhea   [] Vomiting  [] Gastroesophageal reflux/heartburn   [] Difficulty swallowing. Genitourinary:  [] Chronic kidney disease   [] Difficult urination  [] Frequent urination   [] Blood in urine Skin:  [] Rashes   [] Ulcers  Psychological:  [] History of anxiety   []  History of major depression.  Physical Examination  Vitals:   03/23/23 1116 03/23/23 1119  BP: (!) 173/91 (!) 153/95  Pulse: 86 87  Resp: 18 18  Weight: 136 lb 3.2 oz (61.8 kg)   Height: 5\' 3"  (1.6 m)    Body mass index is 24.13 kg/m. Gen: WD/WN, NAD Head: [place]/AT, No temporalis wasting.  Ear/Nose/Throat: Hearing grossly intact, nares w/o erythema or drainage Eyes: PER, EOMI, sclera nonicteric.  Neck: Supple, no masses.  No bruit or JVD.  Pulmonary:  Good air movement, no audible wheezing, no use of accessory muscles.  Cardiac: RRR, normal S1, S2, no Murmurs. Vascular:  mild trophic changes, no open wounds Vessel Right  Left  Radial Palpable Palpable  PT  Palpable Palpable  DP Palpable  Palpable  Gastrointestinal: soft, non-distended. No guarding/no peritoneal signs.  Musculoskeletal: M/S 5/5 throughout.  No visible deformity.  Neurologic: CN 2-12 intact. Pain and light touch intact in extremities.  Symmetrical.  Speech is fluent. Motor exam as listed above. Psychiatric: Judgment intact, Mood & affect appropriate for pt's clinical situation. Dermatologic: No rashes or ulcers noted.  No changes consistent with cellulitis.   CBC Lab Results  Component Value Date   WBC 5.4 07/20/2022   HGB 13.2  07/20/2022   HCT 42.3 07/20/2022   MCV 87.4 07/20/2022   PLT 389 07/20/2022    BMET    Component Value Date/Time   NA 138 10/13/2022 1002   NA 140 05/20/2021 0819   K 4.0 01/15/2023 1049   CL 103 10/13/2022 1002   CO2 29 10/13/2022 1002   GLUCOSE 95 10/13/2022 1002   BUN 13 10/13/2022 1002   BUN 6 (L) 05/20/2021 0819   CREATININE 0.62 10/13/2022 1002   CALCIUM 9.4 10/13/2022 1002   GFRNONAA >60 10/13/2022 1002   GFRAA >60 07/06/2020 0948   CrCl cannot be calculated (Patient's most recent lab result is older than the maximum 21 days allowed.).  COAG No results found for: "INR", "PROTIME"  Radiology VAS [place] ABI WITH/WO TBI  Result Date: 03/23/2023  LOWER EXTREMITY DOPPLER STUDY Patient Name:  Virginia Warner  Date of Exam:   03/23/2023 Medical Rec #: [id number]       Accession #:    [id number] Date of Birth: April 24, 1946       Patient Gender: F Patient Age:   38 years Exam Location:  [facility] Procedure:      VAS [place] ABI WITH/WO TBI Referring Phys: --------------------------------------------------------------------------------  Indications: abnormal home health test  Performing Technologist: Concha Norway RVT  Examination Guidelines: A complete evaluation includes at minimum, Doppler waveform signals and systolic blood pressure reading at the level of bilateral brachial, anterior tibial, and  posterior tibial arteries, when vessel segments are accessible. Bilateral testing is considered an integral part of a complete examination. Photoelectric Plethysmograph (PPG) waveforms and toe systolic pressure readings are included as required and additional duplex testing as needed. Limited examinations for reoccurring indications may be performed as noted.  ABI Findings: +---------+------------------+-----+---------+--------+ Right    Rt Pressure (mmHg)IndexWaveform Comment  +---------+------------------+-----+---------+--------+ Brachial 153                                      +---------+------------------+-----+---------+--------+ ATA      160               1.05 triphasic         +---------+------------------+-----+---------+--------+ PTA      160               1.05 triphasic         +---------+------------------+-----+---------+--------+ Great Toe134               0.88 Normal            +---------+------------------+-----+---------+--------+ +---------+------------------+-----+---------+-------+ Left     Lt Pressure (mmHg)IndexWaveform Comment +---------+------------------+-----+---------+-------+ Brachial 150                                     +---------+------------------+-----+---------+-------+ ATA      171               1.12 triphasic        +---------+------------------+-----+---------+-------+ PTA      176               1.15 triphasic        +---------+------------------+-----+---------+-------+ Great Toe126               0.82 Normal           +---------+------------------+-----+---------+-------+  Summary: Right: Resting right ankle-brachial index is within normal range. The right  toe-brachial index is normal. Left: Resting left ankle-brachial index is within normal range. The left toe-brachial index is normal. *See table(s) above for measurements and observations.  Electronically signed by Hortencia Pilar MD on 03/23/2023 at 4:36:26 PM.     Final      Assessment/Plan 1. Abnormal findings on diagnostic imaging of cardiovascular system Recommend:  I do not find evidence of life style limiting vascular disease. The patient specifically denies life style limitation.  Previous noninvasive studies including ABI's of the legs do not identify critical vascular problems.  The patient should continue walking and begin a more formal exercise program. The patient should continue his antiplatelet therapy and aggressive treatment of the lipid abnormalities.  The patient is instructed to call the office if there is a significant change in the lower extremity symptoms, particularly if a wound develops or there is an abrupt increase in leg pain.  Patient will follow-up with me on a PRN basis - VAS [place] ABI WITH/WO TBI  2. Pain in both lower extremities Recommend:  The patient has atypical pain symptoms for vascular disease and on exam I do not find evidence of vascular pathology that would explain the patient's symptoms.  Noninvasive studies do not identify significant vascular problems  I suspect the patient is c/o pseudoclaudication.  Patient should have an evaluation of the LS spine which I defer to the primary service or the Spine service.  The patient should continue walking and begin a more formal exercise program. The patient should continue his antiplatelet therapy and aggressive treatment of the lipid abnormalities.  Patient will follow-up with me on a PRN basis.  3. Osteoarthritis of spine with radiculopathy, lumbar region Recommend:  The patient has atypical pain symptoms for vascular disease and on exam I do not find evidence of vascular pathology that would explain the patient's symptoms.  Noninvasive studies do not identify significant vascular problems  I suspect the patient is c/o pseudoclaudication.  Patient should have an evaluation of the LS spine which I defer to the primary service or the Spine service.  The  patient should continue walking and begin a more formal exercise program. The patient should continue his antiplatelet therapy and aggressive treatment of the lipid abnormalities.  Patient will follow-up with me on a PRN basis.  4. Essential hypertension Continue antihypertensive medications as already ordered, these medications have been reviewed and there are no changes at this time.  5. Gastroesophageal reflux disease without esophagitis Continue PPI as already ordered, this medication has been reviewed and there are no changes at this time.  Avoidence of caffeine and alcohol  Moderate elevation of the head of the bed   6. Mixed hyperlipidemia Continue statin as ordered and reviewed, no changes at this time    Hortencia Pilar, MD  03/24/2023 10:56 AM

## 2023-03-25 ENCOUNTER — Emergency Department: Payer: 59

## 2023-03-25 ENCOUNTER — Other Ambulatory Visit: Payer: Self-pay

## 2023-03-25 ENCOUNTER — Emergency Department
Admission: EM | Admit: 2023-03-25 | Discharge: 2023-03-26 | Disposition: A | Discharge status: Home / Self Care | Payer: 59 | Attending: Emergency Medicine | Admitting: Emergency Medicine

## 2023-03-25 DIAGNOSIS — E039 Hypothyroidism, unspecified: Secondary | ICD-10-CM | POA: Diagnosis not present

## 2023-03-25 DIAGNOSIS — Z9049 Acquired absence of other specified parts of digestive tract: Secondary | ICD-10-CM | POA: Diagnosis not present

## 2023-03-25 DIAGNOSIS — R450 Nervousness: Secondary | ICD-10-CM | POA: Diagnosis not present

## 2023-03-25 DIAGNOSIS — E876 Hypokalemia: Secondary | ICD-10-CM | POA: Insufficient documentation

## 2023-03-25 DIAGNOSIS — F17210 Nicotine dependence, cigarettes, uncomplicated: Secondary | ICD-10-CM | POA: Diagnosis not present

## 2023-03-25 DIAGNOSIS — I129 Hypertensive chronic kidney disease with stage 1 through stage 4 chronic kidney disease, or unspecified chronic kidney disease: Secondary | ICD-10-CM | POA: Diagnosis not present

## 2023-03-25 DIAGNOSIS — F419 Anxiety disorder, unspecified: Secondary | ICD-10-CM | POA: Insufficient documentation

## 2023-03-25 DIAGNOSIS — K219 Gastro-esophageal reflux disease without esophagitis: Secondary | ICD-10-CM | POA: Diagnosis not present

## 2023-03-25 DIAGNOSIS — R030 Elevated blood-pressure reading, without diagnosis of hypertension: Secondary | ICD-10-CM | POA: Diagnosis not present

## 2023-03-25 DIAGNOSIS — M5416 Radiculopathy, lumbar region: Secondary | ICD-10-CM | POA: Diagnosis not present

## 2023-03-25 DIAGNOSIS — M797 Fibromyalgia: Secondary | ICD-10-CM | POA: Diagnosis not present

## 2023-03-25 DIAGNOSIS — J439 Emphysema, unspecified: Secondary | ICD-10-CM | POA: Diagnosis not present

## 2023-03-25 DIAGNOSIS — Z20822 Contact with and (suspected) exposure to covid-19: Secondary | ICD-10-CM | POA: Diagnosis not present

## 2023-03-25 DIAGNOSIS — I10 Essential (primary) hypertension: Secondary | ICD-10-CM | POA: Diagnosis not present

## 2023-03-25 DIAGNOSIS — N189 Chronic kidney disease, unspecified: Secondary | ICD-10-CM | POA: Diagnosis not present

## 2023-03-25 DIAGNOSIS — R7989 Other specified abnormal findings of blood chemistry: Secondary | ICD-10-CM | POA: Diagnosis not present

## 2023-03-25 DIAGNOSIS — M5126 Other intervertebral disc displacement, lumbar region: Secondary | ICD-10-CM | POA: Diagnosis not present

## 2023-03-25 DIAGNOSIS — I2510 Atherosclerotic heart disease of native coronary artery without angina pectoris: Secondary | ICD-10-CM | POA: Diagnosis not present

## 2023-03-25 DIAGNOSIS — I739 Peripheral vascular disease, unspecified: Secondary | ICD-10-CM | POA: Diagnosis not present

## 2023-03-25 DIAGNOSIS — B9789 Other viral agents as the cause of diseases classified elsewhere: Secondary | ICD-10-CM | POA: Diagnosis not present

## 2023-03-25 LAB — CBC
HCT: 45.4 % (ref 36.0–46.0)
Hemoglobin: 14.4 g/dL (ref 12.0–15.0)
MCH: 27.5 pg (ref 26.0–34.0)
MCHC: 31.7 g/dL (ref 30.0–36.0)
MCV: 86.8 fL (ref 80.0–100.0)
Platelets: 395 10*3/uL (ref 150–400)
RBC: 5.23 MIL/uL — ABNORMAL HIGH (ref 3.87–5.11)
RDW: 13.2 % (ref 11.5–15.5)
WBC: 4.6 10*3/uL (ref 4.0–10.5)
nRBC: 0 % (ref 0.0–0.2)

## 2023-03-25 LAB — BASIC METABOLIC PANEL
Anion gap: 13 (ref 5–15)
BUN: 13 mg/dL (ref 8–23)
CO2: 21 mmol/L — ABNORMAL LOW (ref 22–32)
Calcium: 10.1 mg/dL (ref 8.9–10.3)
Chloride: 100 mmol/L (ref 98–111)
Creatinine, Ser: 0.6 mg/dL (ref 0.44–1.00)
GFR, Estimated: >60 mL/min (ref >60)
Glucose, Bld: 171 mg/dL — ABNORMAL HIGH (ref 70–99)
Potassium: 3.3 mmol/L — ABNORMAL LOW (ref 3.5–5.1)
Sodium: 134 mmol/L — ABNORMAL LOW (ref 135–145)

## 2023-03-25 LAB — TROPONIN I (HIGH SENSITIVITY): Troponin I (High Sensitivity): 13 ng/L (ref <18)

## 2023-03-25 MED ORDER — SODIUM CHLORIDE 0.9 % IV BOLUS
1000.0000 mL | Freq: Once | INTRAVENOUS | Status: AC
  Administered 2023-03-26: 1000 mL via INTRAVENOUS

## 2023-03-25 NOTE — ED Provider Notes (Signed)
[facility] Provider Note    Event Date/Time   First MD Initiated Contact with Patient 03/25/23 2310     (approximate)   History   Hypertension   HPI  Virginia Warner is a 77 y.o. female past medical history significant for hypertension, chronic back pain, history of breast cancer, who presents to the emergency department with high blood pressure.  Patient states that she received a steroid injection earlier today in her back.  Since that time states that she is feeling very anxious.  Checked her blood pressure and it continued to elevate.  Denies any significant chest pain or shortness of breath.  States that she just feels anxious which is not new.  Denies any history of DVT or PE.  Denies nausea or vomiting.  No prior stress testing.     Physical Exam   Triage Vital Signs: ED Triage Vitals  Enc Vitals Group     BP 03/25/23 2205 (!) 189/101     Pulse Rate 03/25/23 2205 (!) 125     Resp 03/25/23 2205 18     Temp 03/25/23 2205 97.7 F (36.5 C)     Temp Source 03/25/23 2205 Oral     SpO2 03/25/23 2205 97 %     Weight --      Height --      Head Circumference --      Peak Flow --      Pain Score 03/25/23 2207 0     Pain Loc --      Pain Edu? --      Excl. in [place]? --     Most recent vital signs: Vitals:   03/26/23 0330 03/26/23 0400  BP: (!) 152/90 (!) 126/92  Pulse: (!) 106 95  Resp: 13 17  Temp:    SpO2: 98% 96%    Physical Exam Constitutional:      Appearance: She is well-developed.  HENT:     Head: Atraumatic.  Eyes:     Conjunctiva/sclera: Conjunctivae normal.  Cardiovascular:     Rate and Rhythm: Regular rhythm. Tachycardia present.  Pulmonary:     Effort: No respiratory distress.     Breath sounds: No wheezing.  Abdominal:     General: There is no distension.     Tenderness: There is no abdominal tenderness.  Musculoskeletal:        General: Normal range of motion.     Cervical back: Normal range of motion.  Skin:     General: Skin is warm.     Capillary Refill: Capillary refill takes less than 2 seconds.  Neurological:     Mental Status: She is alert. Mental status is at baseline.  Psychiatric:     Comments: Anxious mood     IMPRESSION / MDM / ASSESSMENT AND PLAN / ED COURSE  I reviewed the triage vital signs and the nursing notes.  Differential diagnosis including hypertensive emergency, anxiety, pulmonary embolism, pneumonia  EKG  I, Nathaniel Man, the attending physician, personally viewed and interpreted this ECG.   Rate: 125  Rhythm: Normal sinus rhythm  Axis: Normal  Intervals: Normal  ST&T Change: None  Sinus tachycardia while on cardiac telemetry.  RADIOLOGY I independently reviewed imaging, my interpretation of imaging: CT imaging without signs of pulmonary embolism or pneumonia.  Read as no acute findings.  LABS (all labs ordered are listed, but only abnormal results are displayed) Labs interpreted as -    Labs Reviewed  BASIC METABOLIC PANEL -  Abnormal; Notable for the following components:      Result Value   Sodium 134 (*)    Potassium 3.3 (*)    CO2 21 (*)    Glucose, Bld 171 (*)    All other components within normal limits  CBC - Abnormal; Notable for the following components:   RBC 5.23 (*)    All other components within normal limits  D-DIMER, QUANTITATIVE - Abnormal; Notable for the following components:   D-Dimer, Quant 0.78 (*)    All other components within normal limits  TROPONIN I (HIGH SENSITIVITY) - Abnormal; Notable for the following components:   Troponin I (High Sensitivity) 19 (*)    All other components within normal limits  TROPONIN I (HIGH SENSITIVITY) - Abnormal; Notable for the following components:   Troponin I (High Sensitivity) 23 (*)    All other components within normal limits  TROPONIN I (HIGH SENSITIVITY)     MDM  Patient given 1 L of IV fluids.  Low risk Wells criteria, elevated age-adjusted D-dimer so obtained CT angiography  without signs of pulmonary embolism or pneumonia.  Initial troponin was negative and repeat troponin was mildly elevated.  Patient without any chest pain or symptoms concerning for ACS.  Most likely secondary demand from her tachycardia.  Mild hypokalemia.  No significant anemia.  On reevaluation states that she is feeling much better.  No longer anxious.  Tachycardia and hypertension resolved on its own.  Patient did have an elevated troponin however no chest pain and no symptoms concerning for ACS.  Patient ambulating in the emergency department without any signs of respiratory distress.  Tachycardia and high blood pressure most likely in the setting of steroid injection today and anxiety.  Discussed close follow-up with primary care physician.  Given return precautions for any worsening symptoms.     PROCEDURES:  Critical Care performed: No  Procedures  Patient's presentation is most consistent with acute presentation with potential threat to life or bodily function.   MEDICATIONS ORDERED IN ED: Medications  sodium chloride 0.9 % bolus 1,000 mL (0 mLs Intravenous Stopped 03/26/23 0117)  iohexol (OMNIPAQUE) 350 MG/ML injection 75 mL (75 mLs Intravenous Contrast Given 03/26/23 0306)    FINAL CLINICAL IMPRESSION(S) / ED DIAGNOSES   Final diagnoses:  Hypertension, unspecified type  Anxiety     Rx / DC Orders   ED Discharge Orders     None        Note:  This document was prepared using Dragon voice recognition software and may include unintentional dictation errors.   Nathaniel Man, MD 03/26/23 [phone]

## 2023-03-25 NOTE — ED Triage Notes (Signed)
EMS  brings pt in from home for HTN; had cortizone inj today and has been anxious & checking her BP multiple times throughout the day

## 2023-03-25 NOTE — ED Triage Notes (Signed)
Pt to ED via [facility] c/o hypertension. Pt reports she had cortisone shot today and has been taking BP throughout the day. Pt denies any CP, SOB, fevers, dizziness. Pt reports feeling anxious.

## 2023-03-26 ENCOUNTER — Emergency Department: Payer: 59

## 2023-03-26 DIAGNOSIS — I10 Essential (primary) hypertension: Secondary | ICD-10-CM | POA: Diagnosis not present

## 2023-03-26 DIAGNOSIS — J439 Emphysema, unspecified: Secondary | ICD-10-CM | POA: Diagnosis not present

## 2023-03-26 DIAGNOSIS — R7989 Other specified abnormal findings of blood chemistry: Secondary | ICD-10-CM | POA: Diagnosis not present

## 2023-03-26 LAB — TROPONIN I (HIGH SENSITIVITY)
Troponin I (High Sensitivity): 19 ng/L — ABNORMAL HIGH (ref <18)
Troponin I (High Sensitivity): 23 ng/L — ABNORMAL HIGH (ref <18)

## 2023-03-26 LAB — D-DIMER, QUANTITATIVE: D-Dimer, Quant: 0.78 ug/mL-FEU — ABNORMAL HIGH (ref 0.00–0.50)

## 2023-03-26 MED ORDER — IOHEXOL 350 MG/ML SOLN
75.0000 mL | Freq: Once | INTRAVENOUS | Status: AC | PRN
  Administered 2023-03-26: 75 mL via INTRAVENOUS

## 2023-03-26 NOTE — Discharge Instructions (Signed)
Your seen in the emergency department for high blood pressure.  Your blood pressure improved while in the emergency department.  You had no signs of blood clots on your CT scan.  You had no signs of pneumonia.  Your troponin or heart enzyme was mildly elevated.  If you have any chest pain or shortness of breath that is importantly return to the emergency department.  Follow-up closely with your primary care physician to discuss rechecking your blood pressure and to discuss further management of anxiety.

## 2023-03-27 ENCOUNTER — Other Ambulatory Visit: Payer: Self-pay | Admitting: Nurse Practitioner

## 2023-03-30 ENCOUNTER — Encounter: Payer: Self-pay | Admitting: Nurse Practitioner

## 2023-03-30 ENCOUNTER — Ambulatory Visit (INDEPENDENT_AMBULATORY_CARE_PROVIDER_SITE_OTHER): Payer: 59 | Admitting: Nurse Practitioner

## 2023-03-30 VITALS — BP 132/82 | HR 95 | Temp 98.2°F | Resp 16 | Ht 63.0 in | Wt 137.2 lb

## 2023-03-30 DIAGNOSIS — E876 Hypokalemia: Secondary | ICD-10-CM | POA: Diagnosis not present

## 2023-03-30 DIAGNOSIS — I10 Essential (primary) hypertension: Secondary | ICD-10-CM

## 2023-03-30 DIAGNOSIS — F411 Generalized anxiety disorder: Secondary | ICD-10-CM

## 2023-03-30 MED ORDER — BUSPIRONE HCL 5 MG PO TABS: 5.0000 mg | ORAL_TABLET | Freq: Every day | ORAL | 2 refills | Status: DC

## 2023-03-30 NOTE — Progress Notes (Signed)
[facility] [place], [place] [address]  Internal MEDICINE  Office Visit Note  Patient Name: Virginia Warner  [id number]  [id number]  Date of Service: 03/30/2023  Chief Complaint  Patient presents with   Follow-up    Blood pressure    HPI Virginia Warner presents for a follow-up visit for   [facility] to ED for high blood pressure  Lives alone and worries about her BP and other thuings History of anixety  White coat syndrome with hypertension  Recently had a cortisone shot for left hip pain Low potassium    Current Medication: Outpatient Encounter Medications as of 03/30/2023  Medication Sig   acetaminophen (TYLENOL) 500 MG tablet Take 500 mg by mouth daily as needed for moderate pain.    alendronate (FOSAMAX) 70 MG tablet TAKE 1 TABLET EVERY 7 DAYS WITH A FULL GLASS OF WATER ON AN EMPTY STOMACH DO NOT LIE DOWN FOR AT LEAST 30 MIN   aspirin EC 81 MG tablet Take 81 mg by mouth every other day.   diltiazem (CARDIZEM CD) 120 MG 24 hr capsule TAKE 1 CAPSULE BY MOUTH EVERY DAY   gabapentin (NEURONTIN) 100 MG capsule TAKE ONE CAPSULE BY MOUTH EVERY MORNING,ONE CAPSULE IN THE AFTERNOON, AND 3 CAPSULES AT BEDTIME.   latanoprost (XALATAN) 0.005 % ophthalmic solution 1 drop at bedtime.   letrozole (FEMARA) 2.5 MG tablet TAKE 1 TABLET BY MOUTH DAILY   lisinopril (ZESTRIL) 20 MG tablet TAKE ONE TABLET BY MOUTH EVERY MORNING AND ONE IN EVENING   meloxicam (MOBIC) 15 MG tablet Take 15 mg by mouth daily.   Multiple Vitamin (MULTIVITAMIN WITH MINERALS) TABS tablet Take 1 tablet by mouth daily.    predniSONE (DELTASONE) 5 MG tablet Take by mouth.   No facility-administered encounter medications on file as of 03/30/2023.    Surgical History: Past Surgical History:  Procedure Laterality Date   ABDOMINAL HYSTERECTOMY     partial   BREAST BIOPSY Left 06/12/2020   [place] Bx, Ribbon Clip, [facility]   BREAST LUMPECTOMY Left 07/04/2020   [facility] lumpectomy with radiation   BREAST LUMPECTOMY,RADIO  FREQ LOCALIZER,AXILLARY SENTINEL LYMPH NODE BIOPSY Left 07/10/2020   Procedure: BREAST LUMPECTOMY,RADIO FREQ LOCALIZER,AXILLARY SENTINEL LYMPH NODE BIOPSY;  Surgeon: Jules Husbands, MD;  Location: [facility] ORS;  Service: General;  Laterality: Left;   COLONOSCOPY WITH PROPOFOL N/A 10/18/2015   Procedure: COLONOSCOPY WITH PROPOFOL;  Surgeon: Manya Silvas, MD;  Location: [facility] ENDOSCOPY;  Service: Endoscopy;  Laterality: N/A;   COLONOSCOPY WITH PROPOFOL N/A 08/19/2021   Procedure: COLONOSCOPY WITH PROPOFOL;  Surgeon: [name], MD;  Location: [facility] ENDOSCOPY;  Service: Endoscopy;  Laterality: N/A;   INGUINAL HERNIA REPAIR     age 61's   POLYPECTOMY      Medical History: Past Medical History:  Diagnosis Date   Allergy    Arthritis    Breast cancer    Diabetes mellitus without complication    Hyperlipidemia    Hypertension    Personal history of radiation therapy    Seasonal allergies     Family History: Family History  Problem Relation Age of Onset   Cancer Brother    Breast cancer Paternal Aunt     Social History   Socioeconomic History   Marital status: Divorced    Spouse name: Not on file   Number of children: Not on file   Years of education: Not on file   Highest education level: Not on file  Occupational History  Not on file  Tobacco Use   Smoking status: Former    Types: Cigarettes    Quit date: 11/2007    Years since quitting: 15.3    Passive exposure: Past   Smokeless tobacco: Never  Vaping Use   Vaping Use: Never used  Substance and Sexual Activity   Alcohol use: Not Currently    Alcohol/week: 0.0 standard drinks of alcohol   Drug use: No   Sexual activity: Not Currently  Other Topics Concern   Not on file  Social History Narrative   Not on file   Social Determinants of Health   Financial Resource Strain: Not on file  Food Insecurity: Not on file  Transportation Needs: Not on file  Physical Activity: Not on file  Stress: Not on file   Social Connections: Not on file  Intimate Partner Violence: Not on file      Review of Systems  Vital Signs: BP (!) 156/83   Pulse 95   Temp 98.2 F (36.8 C)   Resp 16   Ht 5\' 3"  (1.6 m)   Wt 137 lb 3.2 oz (62.2 kg)   SpO2 98%   BMI 24.30 kg/m    Physical Exam     Assessment/Plan:   General Counseling: Virginia Warner verbalizes understanding of the findings of todays visit and agrees with plan of treatment. I have discussed any further diagnostic evaluation that may be needed or ordered today. We also reviewed her medications today. she has been encouraged to call the office with any questions or concerns that should arise related to todays visit.    No orders of the defined types were placed in this encounter.   No orders of the defined types were placed in this encounter.   No follow-ups on file.   Total time spent:*** Minutes Time spent includes review of chart, medications, test results, and follow up plan with the patient.   [place] Controlled Substance Database was reviewed by me.  This patient was seen by Jonetta Osgood, FNP-C in collaboration with Dr. Clayborn Bigness as a part of collaborative care agreement.   Amery Minasyan R. Valetta Fuller, MSN, FNP-C Internal medicine

## 2023-03-31 ENCOUNTER — Encounter: Payer: Self-pay | Admitting: Nurse Practitioner

## 2023-03-31 ENCOUNTER — Telehealth: Payer: Self-pay | Admitting: Nurse Practitioner

## 2023-03-31 NOTE — Telephone Encounter (Signed)
Received [facility] medical request 03/31/23 for AA.  [facility] request Faxed back; [phone]

## 2023-04-10 DIAGNOSIS — H0012 Chalazion right lower eyelid: Secondary | ICD-10-CM | POA: Diagnosis not present

## 2023-04-10 DIAGNOSIS — H40003 Preglaucoma, unspecified, bilateral: Secondary | ICD-10-CM | POA: Diagnosis not present

## 2023-04-10 DIAGNOSIS — H43813 Vitreous degeneration, bilateral: Secondary | ICD-10-CM | POA: Diagnosis not present

## 2023-04-10 DIAGNOSIS — H2513 Age-related nuclear cataract, bilateral: Secondary | ICD-10-CM | POA: Diagnosis not present

## 2023-04-14 ENCOUNTER — Encounter: Payer: Self-pay | Admitting: Oncology

## 2023-04-14 ENCOUNTER — Inpatient Hospital Stay: Payer: 59 | Attending: Oncology | Admitting: Oncology

## 2023-04-14 VITALS — BP 139/84 | HR 97 | Temp 96.4°F | Ht 63.0 in | Wt 132.0 lb

## 2023-04-14 DIAGNOSIS — G8929 Other chronic pain: Secondary | ICD-10-CM | POA: Diagnosis not present

## 2023-04-14 DIAGNOSIS — Z79811 Long term (current) use of aromatase inhibitors: Secondary | ICD-10-CM | POA: Diagnosis not present

## 2023-04-14 DIAGNOSIS — Z79899 Other long term (current) drug therapy: Secondary | ICD-10-CM | POA: Diagnosis not present

## 2023-04-14 DIAGNOSIS — Z853 Personal history of malignant neoplasm of breast: Secondary | ICD-10-CM | POA: Diagnosis not present

## 2023-04-14 DIAGNOSIS — M545 Low back pain, unspecified: Secondary | ICD-10-CM | POA: Insufficient documentation

## 2023-04-14 DIAGNOSIS — Z87891 Personal history of nicotine dependence: Secondary | ICD-10-CM | POA: Insufficient documentation

## 2023-04-14 DIAGNOSIS — Z170 Estrogen receptor positive status [ER+]: Secondary | ICD-10-CM | POA: Insufficient documentation

## 2023-04-14 DIAGNOSIS — Z923 Personal history of irradiation: Secondary | ICD-10-CM | POA: Insufficient documentation

## 2023-04-14 DIAGNOSIS — Z08 Encounter for follow-up examination after completed treatment for malignant neoplasm: Secondary | ICD-10-CM

## 2023-04-14 DIAGNOSIS — C50912 Malignant neoplasm of unspecified site of left female breast: Secondary | ICD-10-CM | POA: Diagnosis not present

## 2023-04-14 NOTE — Progress Notes (Signed)
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  This was followed by diagnostic mammogram and ultrasound which showed a 7 x 7 x 4 mm mass 3 cm from the nipple at the 7 o'clock position.  Normal-appearing left axillary lymph nodes.  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  ER strongly positive greater than 90%, PR 1 to 10% positive and HER-2 negative   Patient underwent lumpectomy and sentinel lymph node biopsy on 07/10/2020.  Final pathology showed 8 mm grade 1 invasive mucinous carcinoma with negative margins.  Sentinel lymph nodes were negative for malignancy.   Patient completed adjuvant radiation therapy and started letrozole in November 2021    Interval history-patient has occasional pain in the lumpectomy site.  Overall she is tolerating letrozole well she has had chronic low back pain for which she has seen orthopedics  ECOG PS- 1 Pain scale- 3   Review of systems- Review of Systems  Constitutional:   Negative for chills, fever, malaise/fatigue and weight loss.  HENT:  Negative for congestion, ear discharge and nosebleeds.   Eyes:  Negative for blurred vision.  Respiratory:  Negative for cough, hemoptysis, sputum production, shortness of breath and wheezing.   Cardiovascular:  Negative for chest pain, palpitations, orthopnea and claudication.  Gastrointestinal:  Negative for abdominal pain, blood in stool, constipation, diarrhea, heartburn, melena, nausea and vomiting.  Genitourinary:  Negative for dysuria, flank pain, frequency, hematuria and urgency.  Musculoskeletal:  Negative for back pain, joint pain and myalgias.  Skin:  Negative for rash.  Neurological:  Negative for dizziness, tingling, focal weakness, seizures, weakness and headaches.  Endo/Heme/Allergies:  Does not bruise/bleed easily.  Psychiatric/Behavioral:  Negative for depression and suicidal ideas. The patient does not have insomnia.       Allergies  Allergen Reactions   Aspirin Nausea And Vomiting    Cannot tolerate unless enteric coated   Atorvastatin Other (See Comments)    Bone pain     Past Medical History:  Diagnosis Date   Allergy    Arthritis    Breast cancer    Diabetes mellitus without complication    Hyperlipidemia    Hypertension    Personal history of radiation therapy    Seasonal allergies      Past Surgical History:  Procedure Laterality Date   ABDOMINAL HYSTERECTOMY     partial   BREAST BIOPSY Left 06/12/2020   [place] Bx, [place], [facility]   BREAST LUMPECTOMY Left 07/04/2020   [facility] lumpectomy with radiation   BREAST LUMPECTOMY,RADIO FREQ LOCALIZER,AXILLARY SENTINEL LYMPH NODE BIOPSY Left 07/10/2020   Procedure: BREAST LUMPECTOMY,RADIO FREQ LOCALIZER,AXILLARY SENTINEL LYMPH NODE BIOPSY;  Surgeon: Leafy Ro, MD;  Location: [facility] ORS;  Service: General;  Laterality: Left;   COLONOSCOPY WITH PROPOFOL N/A 10/18/2015   Procedure: COLONOSCOPY WITH PROPOFOL;  Surgeon: Scot Jun, MD;   Location: [facility] ENDOSCOPY;  Service: Endoscopy;  Laterality: N/A;   COLONOSCOPY WITH PROPOFOL N/A 08/19/2021   Procedure: COLONOSCOPY WITH PROPOFOL;  Surgeon: Regis Bill, MD;  Location: [facility] ENDOSCOPY;  Service: Endoscopy;  Laterality: N/A;   INGUINAL HERNIA REPAIR     age 62's   POLYPECTOMY      Social History   Socioeconomic History   Marital status: Divorced    Spouse name: Not on file   Number of children: Not on file   Years of education: Not on file   Highest education level: Not on file  Occupational History   Not on file  Tobacco Use   Smoking status: Former    Types: Cigarettes    Quit date: 11/2007    Years since quitting: 15.3    Passive exposure: Past   Smokeless tobacco: Never  Vaping Use   Vaping Use: Never used  Substance and Sexual Activity   Alcohol use: Not Currently    Alcohol/week: 0.0 standard drinks of alcohol   Drug use: No   Sexual activity: Not Currently  Other Topics Concern   Not on file  Social History Narrative   Not on file   Social Determinants of Health   Financial Resource Strain: Not on file  Food Insecurity: Not on file  Transportation Needs: Not on file  Physical Activity: Not on file  Stress: Not on file  Social Connections: Not on file  Intimate Partner Violence: Not on file    Family History  Problem Relation Age of Onset   Cancer Brother    Breast cancer Paternal Aunt      Current Outpatient Medications:    acetaminophen (TYLENOL) 500 MG tablet, Take 500 mg by mouth daily as needed for moderate pain. , Disp: , Rfl:    alendronate (FOSAMAX) 70 MG tablet, TAKE 1 TABLET EVERY 7 DAYS WITH A FULL GLASS OF WATER ON AN EMPTY STOMACH DO NOT LIE DOWN FOR AT LEAST 30 MIN, Disp: 4 tablet, Rfl: 3   aspirin EC 81 MG tablet, Take 81 mg by mouth every other day., Disp: , Rfl:    busPIRone (BUSPAR) 5 MG tablet, Take 1 tablet (5 mg total) by mouth daily., Disp: 30 tablet, Rfl: 2   diltiazem (CARDIZEM CD) 120 MG 24 hr capsule,  TAKE 1 CAPSULE BY MOUTH EVERY DAY, Disp: 30 capsule, Rfl: 3   fluticasone (FLONASE) 50 MCG/ACT nasal spray, , Disp: , Rfl:    gabapentin (NEURONTIN) 100 MG capsule, TAKE ONE CAPSULE BY MOUTH EVERY MORNING,ONE CAPSULE IN THE AFTERNOON, AND 3 CAPSULES AT BEDTIME., Disp: 150 capsule, Rfl: 3   latanoprost (XALATAN) 0.005 % ophthalmic solution, 1 drop at bedtime., Disp: , Rfl:    letrozole (FEMARA) 2.5 MG tablet, TAKE 1 TABLET BY MOUTH DAILY, Disp: 30 tablet, Rfl: 2   lisinopril (ZESTRIL) 20 MG tablet, TAKE ONE TABLET BY MOUTH EVERY MORNING AND ONE IN EVENING, Disp: 180 tablet, Rfl: 3   meloxicam (MOBIC) 15 MG tablet, Take 15 mg by mouth daily., Disp: , Rfl:    Multiple Vitamin (MULTIVITAMIN WITH MINERALS) TABS tablet, Take 1 tablet by mouth daily. , Disp: , Rfl:   Physical exam:  Vitals:   04/14/23 1111 04/14/23 1119  BP: (!) 162/78 139/84  Pulse: 100 97  Temp: (!)  96.4 F (35.8 C)   TempSrc: Tympanic   Weight: 132 lb (59.9 kg)   Height:  (1.6 m)    Physical Exam Cardiovascular:     Rate and Rhythm: Normal rate and regular rhythm.     Heart sounds: Normal heart sounds.  Pulmonary:     Effort: Pulmonary effort is normal.     Breath sounds: Normal breath sounds.  Abdominal:     General: Bowel sounds are normal.     Palpations: Abdomen is soft.  Skin:    General: Skin is warm and dry.  Neurological:     Mental Status: She is alert and oriented to person, place, and time.    Breast exam was performed in seated and lying down position. Patient is status post left lumpectomy with a well-healed surgical scar. No evidence of any palpable masses. No evidence of axillary adenopathy. No evidence of any palpable masses or lumps in the right breast. No evidence of right axillary adenopathy      Latest Ref Rng & Units 03/25/2023   10:20 PM  CMP  Glucose 70 - 99 mg/dL 161   BUN 8 - 23 mg/dL 13   Creatinine 0.96 - 1.00 mg/dL 0.45   Sodium 409 - 811 mmol/L 134   Potassium 3.5 - 5.1  mmol/L 3.3   Chloride 98 - 111 mmol/L 100   CO2 22 - 32 mmol/L 21   Calcium 8.9 - 10.3 mg/dL 91.4       Latest Ref Rng & Units 03/25/2023   10:20 PM  CBC  WBC 4.0 - 10.5 K/uL 4.6   Hemoglobin 12.0 - 15.0 g/dL 78.2   Hematocrit 95.6 - 46.0 % 45.4   Platelets 150 - 400 K/uL 395     No images are attached to the encounter.  CT Angio Chest Pulmonary Embolism (PE) W or WO Contrast  Result Date: 03/26/2023 CLINICAL DATA:  Pulmonary embolus suspected with low to intermediate probability. Positive D-dimer. Hypertension. EXAM: CT ANGIOGRAPHY CHEST WITH CONTRAST TECHNIQUE: Multidetector CT imaging of the chest was performed using the standard protocol during bolus administration of intravenous contrast. Multiplanar CT image reconstructions and MIPs were obtained to evaluate the vascular anatomy. RADIATION DOSE REDUCTION: This exam was performed according to the departmental dose-optimization program which includes automated exposure control, adjustment of the mA and/or kV according to patient size and/or use of iterative reconstruction technique. CONTRAST:  75mL OMNIPAQUE IOHEXOL 350 MG/ML SOLN COMPARISON:  Chest radiograph 01/14/2022 FINDINGS: Cardiovascular: There is good opacification of the central and segmental pulmonary arteries. No focal filling defects. No evidence of significant pulmonary embolus. Normal heart size. No pericardial effusions. Ascending thoracic aorta is upper limits of normal caliber at 3.8 cm. No discrete aneurysm is demonstrated. Calcific and noncalcific plaque formation. No dissection. Great vessel origins are patent. Calcification in the coronary arteries. Mediastinum/Nodes: Esophagus is decompressed. No significant lymphadenopathy. Thyroid gland is unremarkable. Lungs/Pleura: Diffuse emphysematous changes in the lungs. No airspace disease or consolidation. No pleural effusions. No pneumothorax. Bronchial wall thickening suggesting chronic bronchitis. Upper Abdomen: 4 mm stone in  the upper pole left kidney. No hydronephrosis. Musculoskeletal: Degenerative changes in the spine. Review of the MIP images confirms the above findings. IMPRESSION: 1. No evidence of significant pulmonary embolus. 2. Emphysematous changes in the lungs. Chronic bronchitic changes. No evidence of active pulmonary disease. 3. Nonobstructing stones seen in the upper pole left kidney. Electronically Signed   By: Burman Nieves M.D.   On: 03/26/2023 03:19  DG Chest 2 View  Result Date: 03/25/2023 CLINICAL DATA:  Elevated blood pressure EXAM: CHEST - 2 VIEW COMPARISON:  01/14/2022 FINDINGS: The heart size and mediastinal contours are within normal limits. Both lungs are clear. The visualized skeletal structures are unremarkable. IMPRESSION: No active cardiopulmonary disease. Electronically Signed   By: Jasmine Pang M.D.   On: 03/25/2023 22:41   VAS [place] ABI WITH/WO TBI  Result Date: 03/23/2023  LOWER EXTREMITY DOPPLER STUDY Patient Name:  IVER FEHRENBACH  Date of Exam:   03/23/2023 Medical Rec #: [id number]       Accession #:    [id number] Date of Birth: 1946/01/29       Patient Gender: F Patient Age:   70 years Exam Location:  [facility] Procedure:      VAS [place] ABI WITH/WO TBI Referring Phys: --------------------------------------------------------------------------------  Indications: abnormal home health test  Performing Technologist: Salvadore Farber RVT  Examination Guidelines: A complete evaluation includes at minimum, Doppler waveform signals and systolic blood pressure reading at the level of bilateral brachial, anterior tibial, and posterior tibial arteries, when vessel segments are accessible. Bilateral testing is considered an integral part of a complete examination. Photoelectric Plethysmograph (PPG) waveforms and toe systolic pressure readings are included as required and additional duplex testing as needed. Limited examinations for reoccurring indications may be performed as noted.  ABI  Findings: +---------+------------------+-----+---------+--------+ Right    Rt Pressure (mmHg)IndexWaveform Comment  +---------+------------------+-----+---------+--------+ Brachial 153                                      +---------+------------------+-----+---------+--------+ ATA      160               1.05 triphasic         +---------+------------------+-----+---------+--------+ PTA      160               1.05 triphasic         +---------+------------------+-----+---------+--------+ Great Toe134               0.88 Normal            +---------+------------------+-----+---------+--------+ +---------+------------------+-----+---------+-------+ Left     Lt Pressure (mmHg)IndexWaveform Comment +---------+------------------+-----+---------+-------+ Brachial 150                                     +---------+------------------+-----+---------+-------+ ATA      171               1.12 triphasic        +---------+------------------+-----+---------+-------+ PTA      176               1.15 triphasic        +---------+------------------+-----+---------+-------+ Great Toe126               0.82 Normal           +---------+------------------+-----+---------+-------+  Summary: Right: Resting right ankle-brachial index is within normal range. The right toe-brachial index is normal. Left: Resting left ankle-brachial index is within normal range. The left toe-brachial index is normal. *See table(s) above for measurements and observations.  Electronically signed by Levora Dredge MD on 03/23/2023 at 4:36:26 PM.    Final      Assessment and plan- Patient is a 77 y.o. female with history of stage I left breast cancer  ER/PR positive HER2 negative.  She is currently on letrozole and this is a routine follow-up visit for breast cancer  Clinically patient is doing well with no concerning signs and symptoms of recurrenceBased on today's exam.  She has had chronic back pain and  has undergone imaging in the past as well which did not show any evidence of malignancy.  She is following up with Ortho for the same.  Patient will continue taking letrozole for 5 years ending in November 2026.  We will schedule her mammogram for June 2024 and I will see her back in 6 months no labs   Visit Diagnosis 1. Encounter for follow-up surveillance of breast cancer   2. Use of letrozole (Femara)      Dr. Owens Shark, MD, MPH [facility] [phone] 04/14/2023 12:17 PM

## 2023-04-23 DIAGNOSIS — M5126 Other intervertebral disc displacement, lumbar region: Secondary | ICD-10-CM | POA: Diagnosis not present

## 2023-04-23 DIAGNOSIS — M5416 Radiculopathy, lumbar region: Secondary | ICD-10-CM | POA: Diagnosis not present

## 2023-04-23 DIAGNOSIS — M5136 Other intervertebral disc degeneration, lumbar region: Secondary | ICD-10-CM | POA: Diagnosis not present

## 2023-04-23 DIAGNOSIS — M48062 Spinal stenosis, lumbar region with neurogenic claudication: Secondary | ICD-10-CM | POA: Diagnosis not present

## 2023-04-27 ENCOUNTER — Other Ambulatory Visit: Payer: Self-pay | Admitting: Nurse Practitioner

## 2023-04-27 DIAGNOSIS — G5793 Unspecified mononeuropathy of bilateral lower limbs: Secondary | ICD-10-CM

## 2023-05-05 DIAGNOSIS — M25562 Pain in left knee: Secondary | ICD-10-CM | POA: Diagnosis not present

## 2023-05-05 DIAGNOSIS — M545 Low back pain, unspecified: Secondary | ICD-10-CM | POA: Diagnosis not present

## 2023-05-05 DIAGNOSIS — M25552 Pain in left hip: Secondary | ICD-10-CM | POA: Diagnosis not present

## 2023-05-08 ENCOUNTER — Encounter: Payer: Self-pay | Admitting: Physician Assistant

## 2023-05-08 ENCOUNTER — Ambulatory Visit (INDEPENDENT_AMBULATORY_CARE_PROVIDER_SITE_OTHER): Payer: 59 | Admitting: Physician Assistant

## 2023-05-08 VITALS — BP 125/90 | HR 96 | Temp 97.8°F | Resp 16 | Ht 63.0 in | Wt 136.0 lb

## 2023-05-08 DIAGNOSIS — J069 Acute upper respiratory infection, unspecified: Secondary | ICD-10-CM

## 2023-05-08 DIAGNOSIS — M545 Low back pain, unspecified: Secondary | ICD-10-CM | POA: Diagnosis not present

## 2023-05-08 DIAGNOSIS — M25552 Pain in left hip: Secondary | ICD-10-CM | POA: Diagnosis not present

## 2023-05-08 DIAGNOSIS — M25562 Pain in left knee: Secondary | ICD-10-CM | POA: Diagnosis not present

## 2023-05-08 MED ORDER — AZITHROMYCIN 250 MG PO TABS: ORAL_TABLET | ORAL | 0 refills | Status: AC

## 2023-05-08 NOTE — Progress Notes (Signed)
[facility] [address] [place], [place] [address]  Internal MEDICINE  Office Visit Note  Patient Name: Virginia Warner  [id number]  [id number]  Date of Service: 05/21/2023  Chief Complaint  Patient presents with   Acute Visit   Shortness of Breath    Having increased SOB, possible URI     HPI Pt is here for a sick visit. -Some drainage and cough. Some SOB with exertion only, but reports she has been busy doing things as well. -Using nasal spray as needed -Using vicks and states this has helped at night  Current Medication:  Outpatient Encounter Medications as of 05/08/2023  Medication Sig   acetaminophen (TYLENOL) 500 MG tablet Take 500 mg by mouth daily as needed for moderate pain.    alendronate (FOSAMAX) 70 MG tablet TAKE 1 TABLET EVERY 7 DAYS WITH A FULL GLASS OF WATER ON AN EMPTY STOMACH DO NOT LIE DOWN FOR AT LEAST 30 MIN   aspirin EC 81 MG tablet Take 81 mg by mouth every other day.   [EXPIRED] azithromycin (ZITHROMAX) 250 MG tablet Take 2 tablets on day 1, then 1 tablet daily on days 2 through 5   busPIRone (BUSPAR) 5 MG tablet Take 1 tablet (5 mg total) by mouth daily.   diltiazem (CARDIZEM CD) 120 MG 24 hr capsule TAKE 1 CAPSULE BY MOUTH EVERY DAY   fluticasone (FLONASE) 50 MCG/ACT nasal spray    gabapentin (NEURONTIN) 100 MG capsule TAKE ONE CAPSULE BY MOUTH EVERY MORNING,ONE CAPSULE IN THE AFTERNOON, AND 3 CAPSULES AT BEDTIME.   latanoprost (XALATAN) 0.005 % ophthalmic solution 1 drop at bedtime.   lisinopril (ZESTRIL) 20 MG tablet TAKE ONE TABLET BY MOUTH EVERY MORNING AND ONE IN EVENING   meloxicam (MOBIC) 15 MG tablet Take 15 mg by mouth daily.   Multiple Vitamin (MULTIVITAMIN WITH MINERALS) TABS tablet Take 1 tablet by mouth daily.    [DISCONTINUED] letrozole (FEMARA) 2.5 MG tablet TAKE 1 TABLET BY MOUTH DAILY   No facility-administered encounter medications on file as of 05/08/2023.      Medical History: Past Medical History:  Diagnosis  Date   Allergy    Arthritis    Breast cancer (HCC)    Diabetes mellitus without complication (HCC)    Hyperlipidemia    Hypertension    Personal history of radiation therapy    Seasonal allergies      Vital Signs: BP (!) 125/90   Pulse 96   Temp 97.8 F (36.6 C)   Resp 16   Ht 5\' 3"  (1.6 m)   Wt 136 lb (61.7 kg)   SpO2 95%   BMI 24.09 kg/m    Review of Systems  Constitutional:  Negative for fatigue and fever.  HENT:  Positive for congestion and postnasal drip. Negative for mouth sores.   Respiratory:  Positive for cough and shortness of breath.   Cardiovascular:  Negative for chest pain.  Genitourinary:  Negative for flank pain.  Psychiatric/Behavioral: Negative.      Physical Exam Vitals reviewed.  Constitutional:      General: She is not in acute distress.    Appearance: Normal appearance. She is normal weight. She is not ill-appearing.  HENT:     Head: Normocephalic and atraumatic.  Eyes:     Pupils: Pupils are equal, round, and reactive to light.  Cardiovascular:     Rate and Rhythm: Normal rate and regular rhythm.  Pulmonary:     Effort: Pulmonary effort is normal. No  respiratory distress.     Breath sounds: No rhonchi.  Neurological:     Mental Status: She is alert and oriented to person, place, and time.  Psychiatric:        Mood and Affect: Mood is anxious.        Behavior: Behavior normal.       Assessment/Plan: 1. Upper respiratory tract infection, unspecified type Will start zpak and may continue vicks and nasal spray. Call or go to ED if worsening SOB occurs - azithromycin (ZITHROMAX) 250 MG tablet; Take 2 tablets on day 1, then 1 tablet daily on days 2 through 5  Dispense: 6 tablet; Refill: 0   General Counseling: Virginia Warner verbalizes understanding of the findings of todays visit and agrees with plan of treatment. I have discussed any further diagnostic evaluation that may be needed or ordered today. We also reviewed her medications today. she  has been encouraged to call the office with any questions or concerns that should arise related to todays visit.    Counseling:    No orders of the defined types were placed in this encounter.   Meds ordered this encounter  Medications   azithromycin (ZITHROMAX) 250 MG tablet    Sig: Take 2 tablets on day 1, then 1 tablet daily on days 2 through 5    Dispense:  6 tablet    Refill:  0    Time spent:25 Minutes

## 2023-05-12 DIAGNOSIS — M25552 Pain in left hip: Secondary | ICD-10-CM | POA: Diagnosis not present

## 2023-05-12 DIAGNOSIS — M545 Low back pain, unspecified: Secondary | ICD-10-CM | POA: Diagnosis not present

## 2023-05-12 DIAGNOSIS — M25562 Pain in left knee: Secondary | ICD-10-CM | POA: Diagnosis not present

## 2023-05-18 ENCOUNTER — Other Ambulatory Visit: Payer: Self-pay | Admitting: Oncology

## 2023-05-19 DIAGNOSIS — M25562 Pain in left knee: Secondary | ICD-10-CM | POA: Diagnosis not present

## 2023-05-19 DIAGNOSIS — M25552 Pain in left hip: Secondary | ICD-10-CM | POA: Diagnosis not present

## 2023-05-19 DIAGNOSIS — M545 Low back pain, unspecified: Secondary | ICD-10-CM | POA: Diagnosis not present

## 2023-05-22 DIAGNOSIS — M545 Low back pain, unspecified: Secondary | ICD-10-CM | POA: Diagnosis not present

## 2023-05-22 DIAGNOSIS — M25552 Pain in left hip: Secondary | ICD-10-CM | POA: Diagnosis not present

## 2023-05-22 DIAGNOSIS — M25562 Pain in left knee: Secondary | ICD-10-CM | POA: Diagnosis not present

## 2023-05-26 ENCOUNTER — Other Ambulatory Visit: Payer: Self-pay | Admitting: Nurse Practitioner

## 2023-05-26 DIAGNOSIS — I10 Essential (primary) hypertension: Secondary | ICD-10-CM

## 2023-05-26 DIAGNOSIS — F411 Generalized anxiety disorder: Secondary | ICD-10-CM

## 2023-05-26 DIAGNOSIS — M25562 Pain in left knee: Secondary | ICD-10-CM | POA: Diagnosis not present

## 2023-05-26 DIAGNOSIS — M545 Low back pain, unspecified: Secondary | ICD-10-CM | POA: Diagnosis not present

## 2023-05-26 DIAGNOSIS — M25552 Pain in left hip: Secondary | ICD-10-CM | POA: Diagnosis not present

## 2023-05-28 DIAGNOSIS — M545 Low back pain, unspecified: Secondary | ICD-10-CM | POA: Diagnosis not present

## 2023-05-28 DIAGNOSIS — M25562 Pain in left knee: Secondary | ICD-10-CM | POA: Diagnosis not present

## 2023-05-28 DIAGNOSIS — M25552 Pain in left hip: Secondary | ICD-10-CM | POA: Diagnosis not present

## 2023-06-03 DIAGNOSIS — M545 Low back pain, unspecified: Secondary | ICD-10-CM | POA: Diagnosis not present

## 2023-06-03 DIAGNOSIS — M25552 Pain in left hip: Secondary | ICD-10-CM | POA: Diagnosis not present

## 2023-06-03 DIAGNOSIS — M25562 Pain in left knee: Secondary | ICD-10-CM | POA: Diagnosis not present

## 2023-06-04 DIAGNOSIS — H0012 Chalazion right lower eyelid: Secondary | ICD-10-CM | POA: Diagnosis not present

## 2023-06-04 DIAGNOSIS — H43813 Vitreous degeneration, bilateral: Secondary | ICD-10-CM | POA: Diagnosis not present

## 2023-06-04 DIAGNOSIS — H2513 Age-related nuclear cataract, bilateral: Secondary | ICD-10-CM | POA: Diagnosis not present

## 2023-06-04 DIAGNOSIS — H40003 Preglaucoma, unspecified, bilateral: Secondary | ICD-10-CM | POA: Diagnosis not present

## 2023-06-08 DIAGNOSIS — H40003 Preglaucoma, unspecified, bilateral: Secondary | ICD-10-CM | POA: Diagnosis not present

## 2023-06-09 ENCOUNTER — Ambulatory Visit (INDEPENDENT_AMBULATORY_CARE_PROVIDER_SITE_OTHER): Payer: 59 | Admitting: Nurse Practitioner

## 2023-06-09 ENCOUNTER — Encounter: Payer: Self-pay | Admitting: Nurse Practitioner

## 2023-06-09 VITALS — BP 119/70 | HR 87 | Temp 98.2°F | Resp 16 | Ht 63.0 in | Wt 136.2 lb

## 2023-06-09 DIAGNOSIS — R300 Dysuria: Secondary | ICD-10-CM | POA: Diagnosis not present

## 2023-06-09 DIAGNOSIS — R7303 Prediabetes: Secondary | ICD-10-CM | POA: Diagnosis not present

## 2023-06-09 DIAGNOSIS — Z0001 Encounter for general adult medical examination with abnormal findings: Secondary | ICD-10-CM | POA: Diagnosis not present

## 2023-06-09 DIAGNOSIS — F411 Generalized anxiety disorder: Secondary | ICD-10-CM

## 2023-06-09 DIAGNOSIS — E876 Hypokalemia: Secondary | ICD-10-CM | POA: Diagnosis not present

## 2023-06-09 DIAGNOSIS — I10 Essential (primary) hypertension: Secondary | ICD-10-CM

## 2023-06-09 LAB — POCT GLYCOSYLATED HEMOGLOBIN (HGB A1C): Hemoglobin A1C: 5.9 % — AB (ref 4.0–5.6)

## 2023-06-09 MED ORDER — DILTIAZEM HCL ER COATED BEADS 120 MG PO CP24: 120.0000 mg | ORAL_CAPSULE | Freq: Every day | ORAL | 3 refills | Status: DC

## 2023-06-09 NOTE — Progress Notes (Signed)
[facility] ([place]) [address] [place], [place] [address]  Internal MEDICINE  Office Visit Note  Patient Name: Virginia Warner  [id number]  [id number]  Date of Service: 06/09/2023  Chief Complaint  Patient presents with   Medicare Wellness   Diabetes   Hypertension   Hyperlipidemia    HPI Makyah presents for an annual well visit and physical exam.  Well-appearing 77 y.o. female with hypertension, GERD, allergic rhinitis, high cholesterol, anxiety and history of breast cancer.  Routine CRC screening: last colonoscopy was in August 2022 and she was told that this is her last screening colonoscopy Routine mammogram: scheduled for tomorrow  DEXA scan: done in 2023 Eye exam: seen by eye doctor.  Labs: due for routine labs  New or worsening pain: chronic arthritic pain Prediabetes -- A1c improved to 5.9 from 6.2 in October.  Taking buspirone 1 daily and she has been relaxing more Seen by eye doctor, cataracts are not big enough to be removed Need new eye glass prescription      06/09/2023    2:01 PM 06/03/2022    1:57 PM 05/16/2021    1:38 PM  MMSE - Mini Mental State Exam  Orientation to time 5 5 5   Orientation to Place 5 5 5   Registration 3 3 3   Attention/ Calculation 5 5 5   Recall 3 3 3   Language- name 2 objects 2 2 2   Language- repeat 1 1 1   Language- follow 3 step command 3 3 3   Language- read & follow direction 1 1 1   Write a sentence 1 1 1   Copy design 1 1 1   Total score 30 30 30     Functional Status Survey: Is the patient deaf or have difficulty hearing?: No Does the patient have difficulty seeing, even when wearing glasses/contacts?: No Does the patient have difficulty concentrating, remembering, or making decisions?: No Does the patient have difficulty walking or climbing stairs?: No Does the patient have difficulty dressing or bathing?: No Does the patient have difficulty doing errands alone such as visiting a doctor's office or shopping?: No      06/16/2022    1:28 PM 07/20/2022    7:15 AM 10/02/2022    1:22 PM 02/09/2023    3:59 PM 06/09/2023    2:00 PM  Fall Risk  Falls in the past year? 0  0 0 0  Was there an injury with Fall?   0 0   Fall Risk Category Calculator   0 0   Fall Risk Category (Retired)   Low    (RETIRED) Patient Fall Risk Level  Low fall risk Low fall risk    Patient at Risk for Falls Due to   No Fall Risks No Fall Risks   Fall risk Follow up   Falls evaluation completed Falls evaluation completed        06/09/2023    2:01 PM  Depression screen PHQ 2/9  Decreased Interest 0  Down, Depressed, Hopeless 0  PHQ - 2 Score 0       Current Medication: Outpatient Encounter Medications as of 06/09/2023  Medication Sig   acetaminophen (TYLENOL) 500 MG tablet Take 500 mg by mouth daily as needed for moderate pain.    alendronate (FOSAMAX) 70 MG tablet TAKE 1 TABLET EVERY 7 DAYS WITH A FULL GLASS OF WATER ON AN EMPTY STOMACH DO NOT LIE DOWN FOR AT LEAST 30 MIN   aspirin EC 81 MG tablet Take 81 mg by mouth every other day.  busPIRone (BUSPAR) 5 MG tablet TAKE 1 TABLET BY MOUTH ONCE DAILY   fluticasone (FLONASE) 50 MCG/ACT nasal spray    gabapentin (NEURONTIN) 100 MG capsule TAKE ONE CAPSULE BY MOUTH EVERY MORNING,ONE CAPSULE IN THE AFTERNOON, AND 3 CAPSULES AT BEDTIME.   latanoprost (XALATAN) 0.005 % ophthalmic solution 1 drop at bedtime.   letrozole (FEMARA) 2.5 MG tablet TAKE 1 TABLET BY MOUTH DAILY   lisinopril (ZESTRIL) 20 MG tablet TAKE ONE TABLET BY MOUTH EVERY MORNING AND ONE IN EVENING   Multiple Vitamin (MULTIVITAMIN WITH MINERALS) TABS tablet Take 1 tablet by mouth daily.    [DISCONTINUED] diltiazem (CARDIZEM CD) 120 MG 24 hr capsule TAKE 1 CAPSULE BY MOUTH EVERY DAY   [DISCONTINUED] meloxicam (MOBIC) 15 MG tablet Take 15 mg by mouth daily.   diltiazem (CARDIZEM CD) 120 MG 24 hr capsule Take 1 capsule (120 mg total) by mouth daily.   No facility-administered encounter medications on file as of 06/09/2023.     Surgical History: Past Surgical History:  Procedure Laterality Date   ABDOMINAL HYSTERECTOMY     partial   BREAST BIOPSY Left 06/12/2020   [place] Bx, Ribbon Clip, [facility]   BREAST LUMPECTOMY Left 07/04/2020   [facility] lumpectomy with radiation   BREAST LUMPECTOMY,RADIO FREQ LOCALIZER,AXILLARY SENTINEL LYMPH NODE BIOPSY Left 07/10/2020   Procedure: BREAST LUMPECTOMY,RADIO FREQ LOCALIZER,AXILLARY SENTINEL LYMPH NODE BIOPSY;  Surgeon: Leafy Ro, MD;  Location: [facility] ORS;  Service: General;  Laterality: Left;   COLONOSCOPY WITH PROPOFOL N/A 10/18/2015   Procedure: COLONOSCOPY WITH PROPOFOL;  Surgeon: Scot Jun, MD;  Location: [facility] ENDOSCOPY;  Service: Endoscopy;  Laterality: N/A;   COLONOSCOPY WITH PROPOFOL N/A 08/19/2021   Procedure: COLONOSCOPY WITH PROPOFOL;  Surgeon: Regis Bill, MD;  Location: [facility] ENDOSCOPY;  Service: Endoscopy;  Laterality: N/A;   INGUINAL HERNIA REPAIR     age 30's   POLYPECTOMY      Medical History: Past Medical History:  Diagnosis Date   Allergy    Arthritis    Breast cancer (HCC)    Diabetes mellitus without complication (HCC)    Hyperlipidemia    Hypertension    Personal history of radiation therapy    Seasonal allergies     Family History: Family History  Problem Relation Age of Onset   Cancer Brother    Breast cancer Paternal Aunt     Social History   Socioeconomic History   Marital status: Divorced    Spouse name: Not on file   Number of children: Not on file   Years of education: Not on file   Highest education level: Not on file  Occupational History   Not on file  Tobacco Use   Smoking status: Former    Types: Cigarettes    Quit date: 11/2007    Years since quitting: 15.5    Passive exposure: Past   Smokeless tobacco: Never  Vaping Use   Vaping Use: Never used  Substance and Sexual Activity   Alcohol use: Not Currently    Alcohol/week: 0.0 standard drinks of alcohol   Drug use: No   Sexual activity: Not  Currently  Other Topics Concern   Not on file  Social History Narrative   Not on file   Social Determinants of Health   Financial Resource Strain: Not on file  Food Insecurity: Not on file  Transportation Needs: Not on file  Physical Activity: Not on file  Stress: Not on file  Social Connections: Not on file  Intimate Partner  Violence: Not on file      Review of Systems  Constitutional: Negative.  Negative for activity change, appetite change, chills, diaphoresis, fatigue, fever and unexpected weight change.  HENT:  Positive for postnasal drip. Negative for congestion, ear discharge, ear pain, facial swelling, hearing loss, nosebleeds, rhinorrhea, sinus pressure, sinus pain, sneezing, sore throat, tinnitus, trouble swallowing and voice change.   Eyes:  Negative for photophobia, pain, discharge, redness, itching and visual disturbance.  Respiratory:  Negative for apnea, cough, choking, chest tightness, shortness of breath, wheezing and stridor.   Cardiovascular: Negative.  Negative for chest pain, palpitations and leg swelling.  Gastrointestinal: Negative.  Negative for abdominal distention, abdominal pain, anal bleeding, constipation, diarrhea, nausea and rectal pain.  Endocrine: Negative for cold intolerance, heat intolerance, polydipsia, polyphagia and polyuria.  Genitourinary:  Negative for difficulty urinating, flank pain, frequency, genital sores, hematuria, menstrual problem, pelvic pain, urgency, vaginal bleeding, vaginal discharge and vaginal pain.  Musculoskeletal:  Positive for arthralgias and back pain. Negative for gait problem, joint swelling, myalgias and neck pain.  Skin:  Negative for color change, pallor, rash and wound.  Allergic/Immunologic: Negative for environmental allergies, food allergies and immunocompromised state.  Neurological:  Negative for dizziness, seizures, syncope, facial asymmetry, speech difficulty, weakness, light-headedness, numbness and headaches.   Hematological:  Negative for adenopathy. Does not bruise/bleed easily.  Psychiatric/Behavioral:  Negative for agitation, behavioral problems, confusion, decreased concentration, dysphoric mood, hallucinations, self-injury, sleep disturbance and suicidal ideas. The patient is nervous/anxious (improved with buspirone once daily). The patient is not hyperactive.     Vital Signs: BP 119/70   Pulse 87   Temp 98.2 F (36.8 C)   Resp 16   Ht 5\' 3"  (1.6 m)   Wt 136 lb 3.2 oz (61.8 kg)   SpO2 95%   BMI 24.13 kg/m    Physical Exam Vitals reviewed.  Constitutional:      General: She is awake. She is not in acute distress.    Appearance: Normal appearance. She is well-developed, well-groomed and normal weight. She is not ill-appearing or diaphoretic.  HENT:     Head: Normocephalic and atraumatic.     Right Ear: Tympanic membrane, ear canal and external ear normal. There is no impacted cerumen.     Left Ear: Tympanic membrane, ear canal and external ear normal. There is no impacted cerumen.     Nose: Nose normal. No congestion or rhinorrhea.     Mouth/Throat:     Lips: Pink.     Mouth: Mucous membranes are moist.     Pharynx: Oropharynx is clear. Uvula midline. No oropharyngeal exudate or posterior oropharyngeal erythema.  Eyes:     General: Lids are normal. Vision grossly intact. Gaze aligned appropriately.     Extraocular Movements: Extraocular movements intact.     Conjunctiva/sclera: Conjunctivae normal.     Pupils: Pupils are equal, round, and reactive to light.     Funduscopic exam:    Right eye: Red reflex present.        Left eye: Red reflex present. Neck:     Thyroid: No thyromegaly.     Vascular: No carotid bruit or JVD.     Trachea: Trachea and phonation normal. No tracheal deviation.  Cardiovascular:     Rate and Rhythm: Normal rate and regular rhythm.     Pulses: Normal pulses.     Heart sounds: Normal heart sounds, S1 normal and S2 normal. No murmur heard.    No  friction rub. No gallop.  Pulmonary:     Effort: Pulmonary effort is normal. No accessory muscle usage or respiratory distress.     Breath sounds: Normal breath sounds and air entry. No decreased breath sounds, wheezing, rhonchi or rales.  Chest:     Chest wall: No tenderness.     Comments: Declined clinical breast exam Abdominal:     General: Bowel sounds are normal.     Palpations: Abdomen is soft. There is no shifting dullness, fluid wave, mass or pulsatile mass.     Tenderness: There is no abdominal tenderness.  Musculoskeletal:        General: Normal range of motion.     Cervical back: Normal range of motion and neck supple.     Right lower leg: No edema.     Left lower leg: No edema.  Lymphadenopathy:     Cervical: No cervical adenopathy.  Skin:    General: Skin is warm and dry.     Capillary Refill: Capillary refill takes less than 2 seconds.  Neurological:     Mental Status: She is alert and oriented to person, place, and time.     Cranial Nerves: No cranial nerve deficit.     Coordination: Coordination normal.     Gait: Gait normal.  Psychiatric:        Mood and Affect: Mood normal.        Behavior: Behavior normal. Behavior is cooperative.        Thought Content: Thought content normal.        Judgment: Judgment normal.        Assessment/Plan: 1. Encounter for routine adult health examination with abnormal findings Age-appropriate preventive screenings and vaccinations discussed, annual physical exam completed. Routine labs for health maintenance ordered, see below. PHM updated.  - CBC with Differential/Platelet - CMP14+EGFR - Lipid Profile - TSH + free T4 - Vitamin D (25 hydroxy)  2. Prediabetes Routine labs ordered - POCT HgB A1C - CBC with Differential/Platelet - CMP14+EGFR - Lipid Profile - TSH + free T4 - Vitamin D (25 hydroxy)  3. Essential hypertension Stable, Continue diltiazem as prescribed. Routine labs ordered - CBC with  Differential/Platelet - CMP14+EGFR - Lipid Profile - TSH + free T4 - Vitamin D (25 hydroxy) - diltiazem (CARDIZEM CD) 120 MG 24 hr capsule; Take 1 capsule (120 mg total) by mouth daily.  Dispense: 90 capsule; Refill: 3  4. Hypokalemia Routine lab ordered - CMP14+EGFR  5. Dysuria Routine urinalysis done - UA/M w/rflx Culture, Routine - Microscopic Examination  6. GAD (generalized anxiety disorder) Taking buspirone once daily and it is helping, may continue this or increase to twice daily as prescribed.       General Counseling: tanza vranich understanding of the findings of todays visit and agrees with plan of treatment. I have discussed any further diagnostic evaluation that may be needed or ordered today. We also reviewed her medications today. she has been encouraged to call the office with any questions or concerns that should arise related to todays visit.    Orders Placed This Encounter  Procedures   UA/M w/rflx Culture, Routine   CBC with Differential/Platelet   CMP14+EGFR   Lipid Profile   TSH + free T4   Vitamin D (25 hydroxy)   POCT HgB A1C    Meds ordered this encounter  Medications   diltiazem (CARDIZEM CD) 120 MG 24 hr capsule    Sig: Take 1 capsule (120 mg total) by mouth daily.    Dispense:  90 capsule  Refill:  3    NEXT REFILL    Return in about 6 months (around 12/09/2023) for F/U, Talulah Schirmer PCP.   Total time spent:30 Minutes Time spent includes review of chart, medications, test results, and follow up plan with the patient.   [place] Controlled Substance Database was reviewed by me.  This patient was seen by Sallyanne Kuster, FNP-C in collaboration with Dr. Beverely Risen as a part of collaborative care agreement.  Delanee Virginia R. Tedd Sias, MSN, FNP-C Internal medicine

## 2023-06-10 ENCOUNTER — Encounter: Payer: Self-pay | Admitting: Nurse Practitioner

## 2023-06-10 ENCOUNTER — Ambulatory Visit
Admission: RE | Admit: 2023-06-10 | Discharge: 2023-06-10 | Disposition: A | Discharge status: Home / Self Care | Payer: 59 | Source: Ambulatory Visit | Attending: Oncology | Admitting: Oncology

## 2023-06-10 DIAGNOSIS — Z08 Encounter for follow-up examination after completed treatment for malignant neoplasm: Secondary | ICD-10-CM | POA: Insufficient documentation

## 2023-06-10 DIAGNOSIS — Z79811 Long term (current) use of aromatase inhibitors: Secondary | ICD-10-CM | POA: Insufficient documentation

## 2023-06-10 DIAGNOSIS — Z853 Personal history of malignant neoplasm of breast: Secondary | ICD-10-CM | POA: Insufficient documentation

## 2023-06-10 DIAGNOSIS — R92323 Mammographic fibroglandular density, bilateral breasts: Secondary | ICD-10-CM | POA: Diagnosis not present

## 2023-06-10 LAB — UA/M W/RFLX CULTURE, ROUTINE
Bilirubin, UA: NEGATIVE
Glucose, UA: NEGATIVE
Ketones, UA: NEGATIVE
Leukocytes,UA: NEGATIVE
Nitrite, UA: NEGATIVE
Protein,UA: NEGATIVE
Specific Gravity, UA: 1.015 (ref 1.005–1.030)
Urobilinogen, Ur: 1 mg/dL (ref 0.2–1.0)
pH, UA: 7.5 (ref 5.0–7.5)

## 2023-06-10 LAB — MICROSCOPIC EXAMINATION
Bacteria, UA: NONE SEEN
Casts: NONE SEEN /lpf
Epithelial Cells (non renal): NONE SEEN /hpf (ref 0–10)
RBC, Urine: >30 /hpf — AB (ref 0–2)

## 2023-06-11 DIAGNOSIS — M25552 Pain in left hip: Secondary | ICD-10-CM | POA: Diagnosis not present

## 2023-06-11 DIAGNOSIS — M25562 Pain in left knee: Secondary | ICD-10-CM | POA: Diagnosis not present

## 2023-06-11 DIAGNOSIS — M545 Low back pain, unspecified: Secondary | ICD-10-CM | POA: Diagnosis not present

## 2023-06-12 DIAGNOSIS — M25552 Pain in left hip: Secondary | ICD-10-CM | POA: Diagnosis not present

## 2023-06-12 DIAGNOSIS — M25562 Pain in left knee: Secondary | ICD-10-CM | POA: Diagnosis not present

## 2023-06-12 DIAGNOSIS — M545 Low back pain, unspecified: Secondary | ICD-10-CM | POA: Diagnosis not present

## 2023-06-17 ENCOUNTER — Other Ambulatory Visit
Admission: RE | Admit: 2023-06-17 | Discharge: 2023-06-17 | Disposition: A | Discharge status: Home / Self Care | Payer: 59 | Attending: Nurse Practitioner | Admitting: Nurse Practitioner

## 2023-06-17 ENCOUNTER — Ambulatory Visit (INDEPENDENT_AMBULATORY_CARE_PROVIDER_SITE_OTHER): Payer: 59 | Admitting: Surgery

## 2023-06-17 ENCOUNTER — Encounter: Payer: Self-pay | Admitting: Surgery

## 2023-06-17 VITALS — BP 145/89 | HR 78 | Temp 98.0°F | Ht 63.0 in | Wt 133.0 lb

## 2023-06-17 DIAGNOSIS — I10 Essential (primary) hypertension: Secondary | ICD-10-CM | POA: Insufficient documentation

## 2023-06-17 DIAGNOSIS — E559 Vitamin D deficiency, unspecified: Secondary | ICD-10-CM | POA: Insufficient documentation

## 2023-06-17 DIAGNOSIS — Z170 Estrogen receptor positive status [ER+]: Secondary | ICD-10-CM

## 2023-06-17 DIAGNOSIS — R7303 Prediabetes: Secondary | ICD-10-CM | POA: Insufficient documentation

## 2023-06-17 DIAGNOSIS — M25552 Pain in left hip: Secondary | ICD-10-CM | POA: Diagnosis not present

## 2023-06-17 DIAGNOSIS — M545 Low back pain, unspecified: Secondary | ICD-10-CM | POA: Diagnosis not present

## 2023-06-17 DIAGNOSIS — E876 Hypokalemia: Secondary | ICD-10-CM | POA: Insufficient documentation

## 2023-06-17 DIAGNOSIS — Z0001 Encounter for general adult medical examination with abnormal findings: Secondary | ICD-10-CM | POA: Diagnosis not present

## 2023-06-17 DIAGNOSIS — C50312 Malignant neoplasm of lower-inner quadrant of left female breast: Secondary | ICD-10-CM

## 2023-06-17 DIAGNOSIS — M25562 Pain in left knee: Secondary | ICD-10-CM | POA: Diagnosis not present

## 2023-06-17 LAB — CBC WITH DIFFERENTIAL/PLATELET
Abs Immature Granulocytes: 0.01 10*3/uL (ref 0.00–0.07)
Basophils Absolute: 0.1 10*3/uL (ref 0.0–0.1)
Basophils Relative: 2 %
Eosinophils Absolute: 0.5 10*3/uL (ref 0.0–0.5)
Eosinophils Relative: 9 %
HCT: 44.3 % (ref 36.0–46.0)
Hemoglobin: 13.8 g/dL (ref 12.0–15.0)
Immature Granulocytes: 0 %
Lymphocytes Relative: 42 %
Lymphs Abs: 2.3 10*3/uL (ref 0.7–4.0)
MCH: 27.2 pg (ref 26.0–34.0)
MCHC: 31.2 g/dL (ref 30.0–36.0)
MCV: 87.4 fL (ref 80.0–100.0)
Monocytes Absolute: 0.5 10*3/uL (ref 0.1–1.0)
Monocytes Relative: 8 %
Neutro Abs: 2.2 10*3/uL (ref 1.7–7.7)
Neutrophils Relative %: 39 %
Platelets: 383 10*3/uL (ref 150–400)
RBC: 5.07 MIL/uL (ref 3.87–5.11)
RDW: 13.1 % (ref 11.5–15.5)
WBC: 5.6 10*3/uL (ref 4.0–10.5)
nRBC: 0 % (ref 0.0–0.2)

## 2023-06-17 LAB — COMPREHENSIVE METABOLIC PANEL
ALT: 17 U/L (ref 0–44)
AST: 19 U/L (ref 15–41)
Albumin: 4.5 g/dL (ref 3.5–5.0)
Alkaline Phosphatase: 39 U/L (ref 38–126)
Anion gap: 10 (ref 5–15)
BUN: 13 mg/dL (ref 8–23)
CO2: 28 mmol/L (ref 22–32)
Calcium: 9.2 mg/dL (ref 8.9–10.3)
Chloride: 105 mmol/L (ref 98–111)
Creatinine, Ser: 0.68 mg/dL (ref 0.44–1.00)
GFR, Estimated: >60 mL/min (ref >60)
Glucose, Bld: 118 mg/dL — ABNORMAL HIGH (ref 70–99)
Potassium: 3.8 mmol/L (ref 3.5–5.1)
Sodium: 143 mmol/L (ref 135–145)
Total Bilirubin: 0.7 mg/dL (ref 0.3–1.2)
Total Protein: 7.9 g/dL (ref 6.5–8.1)

## 2023-06-17 LAB — LIPID PANEL
Cholesterol: 263 mg/dL — ABNORMAL HIGH (ref 0–200)
HDL: 59 mg/dL (ref >40)
LDL Cholesterol: 171 mg/dL — ABNORMAL HIGH (ref 0–99)
Total CHOL/HDL Ratio: 4.5 RATIO
Triglycerides: 164 mg/dL — ABNORMAL HIGH (ref <150)
VLDL: 33 mg/dL (ref 0–40)

## 2023-06-17 LAB — VITAMIN D 25 HYDROXY (VIT D DEFICIENCY, FRACTURES): Vit D, 25-Hydroxy: 24.85 ng/mL — ABNORMAL LOW (ref 30–100)

## 2023-06-17 LAB — TSH: TSH: 1.348 u[IU]/mL (ref 0.350–4.500)

## 2023-06-17 LAB — T4, FREE: Free T4: 0.75 ng/dL (ref 0.61–1.12)

## 2023-06-17 NOTE — Patient Instructions (Addendum)
Follow up with Dr Smith Robert next year. She will order your mammograms.   Follow-up with our office as needed.  Continue self breast exams. Call office for any new breast issues or concerns.   Breast Self-Awareness Breast self-awareness means being familiar with how your breasts look and feel. It involves checking your breasts regularly and telling your health care provider about any changes. Practicing breast self-awareness helps to maintain breast health. Sometimes, changes are not harmful (are benign). Other times, a change in your breasts can be a sign of a serious medical problem. Being familiar with the look and feel of your breasts can help you catch a breast problem while it is still small and can be treated. You should do breast self-exams even if you have breast implants. What you need: A mirror. A well-lit room. A pillow or other soft object. How to do a breast self-exam A breast self-exam is one way to learn what is normal for your breasts and whether your breasts are changing. To do a breast self-exam: Look for changes  Remove all the clothing above your waist. Stand in front of a mirror in a room with good lighting. Put your hands down at your sides. Compare your breasts in the mirror. Look for differences between them (asymmetry), such as: Differences in shape. Differences in size. Puckers, dips, and bumps in one breast and not the other. Look at each breast for changes in the skin, such as: Redness. Scaly areas. Skin thickening. Dimpling. Open sores (ulcers). Look for changes in your nipples, such as: Discharge. Bleeding. Dimpling. Redness. A nipple that looks pushed in (retracted), or that has changed position. Feel for changes Carefully feel your breasts for lumps and changes. It is best to do this self-exam while lying down. Follow these steps to feel each breast: Place a pillow under the shoulder of one side of your body. Place the arm of that side of your body  behind your head. Feel the breast of that side of your body using the hand of the opposite arm. To do this: Start in the nipple area and use the pads of your three middle fingers to make -inch (2 cm) overlapping circles. Use light, medium, and then firm pressure as you feel your breast, gently covering the entire breast area and armpit. Continue the overlapping circles, moving downward over the breast until you feel your ribs below your breast. Then, make circles with your fingers going upward until you reach your collarbone. Next, make circles by moving outward across your breast and into your armpit area. Squeeze the nipple. Check for discharge and lumps. Repeat steps 1-7 to check your other breast. Sit or stand in the tub or shower. With soapy water on your skin, feel each breast the same way you did when you were lying down. Write down what you find Writing down what you find can help you remember what to discuss with your health care provider. Write down: What is normal for each breast. Any changes that you find in each breast. These include: The kind of changes you find. Any pain or tenderness. Size and location of any lumps. Where you are in your menstrual cycle, if you are still getting your menstrual period (menstruating). General tips If you are breastfeeding, the best time to examine your breasts is after a feeding or after using a breast pump. If you menstruate, the best time to examine your breasts is 5-7 days after your menstrual period. Breasts are generally lumpier during  menstrual periods, and it may be more difficult to notice changes. With time and practice, you will become more familiar with the differences in your breasts and more comfortable with the exam. Contact a health care provider if: You see a change in the shape or size of your breasts or nipples. You see a change in the skin of your breast or nipples, such as a reddened or scaly area. You have unusual  discharge from your nipples. You find a new lump or thick area. You have breast pain. You have any concerns about your breast health. Summary Breast self-awareness includes looking for physical changes in your breasts and feeling for any changes within your breasts. Breast self-awareness should be done in front of a mirror in a well-lit room. If you menstruate, the best time to examine your breasts is 5-7 days after your menstrual period. Tell your health care provider about any changes you notice in your breasts. Changes include changes in size, changes on the skin, pain or tenderness, or unusual fluid from your nipples. This information is not intended to replace advice given to you by your health care provider. Make sure you discuss any questions you have with your health care provider. Document Revised: 05/22/2022 Document Reviewed: 10/17/2021 Elsevier Patient Education  2024 [employer].

## 2023-06-18 ENCOUNTER — Other Ambulatory Visit: Payer: Self-pay

## 2023-06-18 ENCOUNTER — Telehealth: Payer: Self-pay

## 2023-06-18 ENCOUNTER — Encounter: Payer: Self-pay | Admitting: Surgery

## 2023-06-18 MED ORDER — ERGOCALCIFEROL 1.25 MG (50000 UT) PO CAPS: 50000.0000 [IU] | ORAL_CAPSULE | ORAL | 5 refills | Status: DC

## 2023-06-18 NOTE — Telephone Encounter (Signed)
As per alyssa send drisdol to [facility] rest of labs we will call her back when alyssa review

## 2023-06-18 NOTE — Progress Notes (Signed)
Surgical Consultation  06/18/2023  Virginia Warner is an 77 y.o. female.   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  ER strongly positive greater than 90%, PR 1 to 10% positive and HER-2 negative.  Received adjuvant radiation.  She had a recent mammogram that have personally reviewed showing no evidence of any suspicious lesions.  She is tolerating letrozole. No fevers, no chills no new lumps. She is tolerating letrozole. Has no major complaints regarding her breast.  Lumps or bumps.  Denies any discharge. Recent CBC and CMP nml Past Medical History:  Diagnosis Date   Allergy    Arthritis    Breast cancer (HCC)    Diabetes mellitus without complication (HCC)    Hyperlipidemia    Hypertension    Personal history of radiation therapy    Seasonal allergies     Past Surgical History:  Procedure Laterality Date   ABDOMINAL HYSTERECTOMY     partial   BREAST BIOPSY Left 06/12/2020   [place] Bx, [place], [facility]   BREAST LUMPECTOMY Left 07/04/2020   [facility] lumpectomy with radiation   BREAST LUMPECTOMY,RADIO FREQ LOCALIZER,AXILLARY SENTINEL LYMPH NODE BIOPSY Left 07/10/2020   Procedure: BREAST LUMPECTOMY,RADIO FREQ LOCALIZER,AXILLARY SENTINEL LYMPH NODE BIOPSY;  Surgeon: Leafy Ro, MD;  Location: [facility] ORS;  Service: General;  Laterality: Left;   COLONOSCOPY WITH PROPOFOL N/A 10/18/2015   Procedure: COLONOSCOPY WITH PROPOFOL;  Surgeon: Scot Jun, MD;  Location: [facility] ENDOSCOPY;  Service: Endoscopy;  Laterality: N/A;   COLONOSCOPY WITH PROPOFOL N/A 08/19/2021   Procedure: COLONOSCOPY WITH PROPOFOL;  Surgeon: Regis Bill, MD;  Location: [facility] ENDOSCOPY;  Service: Endoscopy;  Laterality: N/A;   INGUINAL HERNIA REPAIR     age 6's   POLYPECTOMY      Family History  Problem Relation Age of Onset   Cancer  Brother    Breast cancer Paternal Aunt     Social History:  reports that she quit smoking about 15 years ago. Her smoking use included cigarettes. She has been exposed to tobacco smoke. She has never used smokeless tobacco. She reports that she does not currently use alcohol. She reports that she does not use drugs.  Allergies:  Allergies  Allergen Reactions   Aspirin Nausea And Vomiting    Cannot tolerate unless enteric coated   Atorvastatin Other (See Comments)    Bone pain    Medications reviewed.     ROS Full ROS performed and is otherwise negative other than what is stated in the HPI    BP (!) 145/89   Pulse 78   Temp 98 F (36.7 C)   Ht 5\' 3"  (1.6 m)   Wt 133 lb (60.3 kg)   SpO2 98%   BMI 23.56 kg/m   Physical Exam Vitals and nursing note reviewed. Exam conducted with a chaperone present.  Constitutional:      General: She is not in acute distress.    Appearance: Normal appearance. She is normal weight.  Eyes:     General: No scleral icterus.       Right eye: No discharge.        Left eye: No discharge.  Cardiovascular:     Rate and Rhythm: Normal rate and regular rhythm.     Heart sounds: No murmur heard. Pulmonary:     Effort: Pulmonary effort is normal. No  respiratory distress.     Breath sounds: Normal breath sounds. No stridor.     Comments: BREAST: Left lumpectomy scar and radiation changes.  No evidence of infection no evidence of new lesions.  No significant deformity. There is no evidence of lymphedema and no evidence of lymphadenopathy.  Right breast and right axilla is completely normal. Musculoskeletal:        General: No swelling or tenderness. Normal range of motion.     Cervical back: Normal range of motion and neck supple. No rigidity or tenderness.  Skin:    General: Skin is warm and dry.  Neurological:     General: No focal deficit present.     Mental Status: She is alert and oriented to person, place, and time.  Psychiatric:         Mood and Affect: Mood normal.        Behavior: Behavior normal.        Thought Content: Thought content normal.        Judgment: Judgment normal   Results for orders placed or performed during the hospital encounter of 06/17/23 (from the past 48 hour(s))  CBC with Differential/Platelet     Status: None   Collection Time: 06/17/23  7:28 AM  Result Value Ref Range   WBC 5.6 4.0 - 10.5 K/uL   RBC 5.07 3.87 - 5.11 MIL/uL   Hemoglobin 13.8 12.0 - 15.0 g/dL   HCT 91.4 78.2 - 95.6 %   MCV 87.4 80.0 - 100.0 fL   MCH 27.2 26.0 - 34.0 pg   MCHC 31.2 30.0 - 36.0 g/dL   RDW 21.3 08.6 - 57.8 %   Platelets 383 150 - 400 K/uL   nRBC 0.0 0.0 - 0.2 %   Neutrophils Relative % 39 %   Neutro Abs 2.2 1.7 - 7.7 K/uL   Lymphocytes Relative 42 %   Lymphs Abs 2.3 0.7 - 4.0 K/uL   Monocytes Relative 8 %   Monocytes Absolute 0.5 0.1 - 1.0 K/uL   Eosinophils Relative 9 %   Eosinophils Absolute 0.5 0.0 - 0.5 K/uL   Basophils Relative 2 %   Basophils Absolute 0.1 0.0 - 0.1 K/uL   Immature Granulocytes 0 %   Abs Immature Granulocytes 0.01 0.00 - 0.07 K/uL    Comment: Performed at [facility], [address] [place], [place] [address]  Comprehensive metabolic panel     Status: Abnormal   Collection Time: 06/17/23  7:28 AM  Result Value Ref Range   Sodium 143 135 - 145 mmol/L   Potassium 3.8 3.5 - 5.1 mmol/L   Chloride 105 98 - 111 mmol/L   CO2 28 22 - 32 mmol/L   Glucose, Bld 118 (H) 70 - 99 mg/dL    Comment: Glucose reference range applies only to samples taken after fasting for at least 8 hours.   BUN 13 8 - 23 mg/dL   Creatinine, Ser 9.52 0.44 - 1.00 mg/dL   Calcium 9.2 8.9 - 84.1 mg/dL   Total Protein 7.9 6.5 - 8.1 g/dL   Albumin 4.5 3.5 - 5.0 g/dL   AST 19 15 - 41 U/L   ALT 17 0 - 44 U/L   Alkaline Phosphatase 39 38 - 126 U/L   Total Bilirubin 0.7 0.3 - 1.2 mg/dL   GFR, Estimated >32 >44 mL/min    Comment: (NOTE) Calculated using the CKD-EPI Creatinine Equation (2021)    Anion  gap 10 5 - 15  Comment: Performed at [facility], [address] [place], [place] [address]  Lipid panel     Status: Abnormal   Collection Time: 06/17/23  7:28 AM  Result Value Ref Range   Cholesterol 263 (H) 0 - 200 mg/dL   Triglycerides 191 (H) <150 mg/dL   HDL 59 >47 mg/dL   Total CHOL/HDL Ratio 4.5 RATIO   VLDL 33 0 - 40 mg/dL   LDL Cholesterol 829 (H) 0 - 99 mg/dL    Comment:        Total Cholesterol/HDL:CHD Risk Coronary Heart Disease Risk Table                     Men   Women  1/2 Average Risk   3.4   3.3  Average Risk       5.0   4.4  2 X Average Risk   9.6   7.1  3 X Average Risk  23.4   11.0        Use the calculated Patient Ratio above and the CHD Risk Table to determine the patient's CHD Risk.        ATP III CLASSIFICATION (LDL):  <100     mg/dL   Optimal  562-130  mg/dL   Near or Above                    Optimal  130-159  mg/dL   Borderline  865-784  mg/dL   High  >696     mg/dL   Very High Performed at [facility], [address] [place], [place] [address]   TSH     Status: None   Collection Time: 06/17/23  7:28 AM  Result Value Ref Range   TSH 1.348 0.350 - 4.500 uIU/mL    Comment: Performed by a 3rd Generation assay with a functional sensitivity of <=0.01 uIU/mL. Performed at [facility], [address] [place], [place] [address]   T4, free     Status: None   Collection Time: 06/17/23  7:28 AM  Result Value Ref Range   Free T4 0.75 0.61 - 1.12 ng/dL    Comment: (NOTE) Biotin ingestion may interfere with free T4 tests. If the results are inconsistent with the TSH level, previous test results, or the clinical presentation, then consider biotin interference. If needed, order repeat testing after stopping biotin. Performed at [facility], [address] [place], [place] [address]   VITAMIN D 25 Hydroxy (Vit-D Deficiency, Fractures)     Status: Abnormal   Collection Time: 06/17/23  7:28 AM  Result Value  Ref Range   Vit D, 25-Hydroxy 24.85 (L) 30 - 100 ng/mL    Comment: (NOTE) Vitamin D deficiency has been defined by the Institute of Medicine  and an Endocrine Society practice guideline as a level of serum 25-OH  vitamin D less than 20 ng/mL (1,2). The Endocrine Society went on to  further define vitamin D insufficiency as a level between 21 and 29  ng/mL (2).  1. IOM (Institute of Medicine). 2010. Dietary reference intakes for  calcium and D. [place]: [employer]. 2. Holick MF, [place], Bischoff-Ferrari HA, et al. Evaluation,  treatment, and prevention of vitamin D deficiency: an Endocrine  Society clinical practice guideline, JCEM. 2011 Jul; 96(7): 1911-30.  Performed at [facility], [address], [place], [place] [address]    No results found.  Assessment/Plan: 1. Malignant neoplasm of  lower-inner quadrant of left breast in female, estrogen receptor positive (HCC) No evidence of recurrence or any suspicious lesions .  Option of yearly breast exam given to the pt. She will call us back once she makes her mind up. She is very appreciative   Please note I spent greater than 30 minutes in this encounter personally reviewing studies, medical, counseling the patient and performing appropriate documentation    Sterling Big, MD [facility] General Surgeon

## 2023-06-19 DIAGNOSIS — M25562 Pain in left knee: Secondary | ICD-10-CM | POA: Diagnosis not present

## 2023-06-19 DIAGNOSIS — M25552 Pain in left hip: Secondary | ICD-10-CM | POA: Diagnosis not present

## 2023-06-19 DIAGNOSIS — M545 Low back pain, unspecified: Secondary | ICD-10-CM | POA: Diagnosis not present

## 2023-06-23 ENCOUNTER — Telehealth: Payer: Self-pay

## 2023-06-23 NOTE — Telephone Encounter (Signed)
Left message for patient to give office a call.

## 2023-06-23 NOTE — Progress Notes (Signed)
Vitamin D -- slightly low, take the weekly supplement as prescribed.  CMP is normal except for slightly elevated glucose which is ok Cholesterol is still pretty elevated  -- I recommend starting a very small dose of cholesterol medication, probably rosuvastatin  CBC is normal Thyroid labs are normal

## 2023-06-25 ENCOUNTER — Other Ambulatory Visit: Payer: Self-pay | Admitting: Nurse Practitioner

## 2023-06-25 DIAGNOSIS — M810 Age-related osteoporosis without current pathological fracture: Secondary | ICD-10-CM

## 2023-06-29 ENCOUNTER — Ambulatory Visit (INDEPENDENT_AMBULATORY_CARE_PROVIDER_SITE_OTHER): Payer: 59 | Admitting: Nurse Practitioner

## 2023-06-29 ENCOUNTER — Encounter: Payer: Self-pay | Admitting: Nurse Practitioner

## 2023-06-29 VITALS — BP 136/76 | HR 91 | Temp 97.8°F | Resp 16 | Ht 63.0 in | Wt 135.0 lb

## 2023-06-29 DIAGNOSIS — J309 Allergic rhinitis, unspecified: Secondary | ICD-10-CM | POA: Diagnosis not present

## 2023-06-29 DIAGNOSIS — J018 Other acute sinusitis: Secondary | ICD-10-CM | POA: Diagnosis not present

## 2023-06-29 DIAGNOSIS — I700 Atherosclerosis of aorta: Secondary | ICD-10-CM

## 2023-06-29 DIAGNOSIS — R0982 Postnasal drip: Secondary | ICD-10-CM | POA: Diagnosis not present

## 2023-06-29 DIAGNOSIS — R040 Epistaxis: Secondary | ICD-10-CM

## 2023-06-29 MED ORDER — ROSUVASTATIN CALCIUM 5 MG PO TABS
5.0000 mg | ORAL_TABLET | Freq: Every day | ORAL | 1 refills | Status: DC
Start: 2023-06-29 — End: 2024-03-14

## 2023-06-29 NOTE — Progress Notes (Signed)
[facility] [address] [place], [place] [address]  Internal MEDICINE  Office Visit Note  Patient Name: Virginia Warner  [id number]  [id number]  Date of Service: 06/29/2023  Chief Complaint  Patient presents with   Acute Visit    Nose bleeds     HPI Cristol presents for an acute sick visit for left nare nosebleed.  Nosebleed -- started using steroid nasal spray which dried out nose again. This happened around the same time last year.  Has afrin available and uses saline nasal spray every day . LDL 170s -- need rosuvastatin ordered.  Has allergic rhinitis with post nasal drip     Current Medication:  Outpatient Encounter Medications as of 06/29/2023  Medication Sig   acetaminophen (TYLENOL) 500 MG tablet Take 500 mg by mouth daily as needed for moderate pain.    alendronate (FOSAMAX) 70 MG tablet TAKE 1 TABLET EVERY 7 DAYS WITH A FULL GLASS OF WATER ON AN EMPTY STOMACH DO NOT LIE DOWN FOR AT LEAST 30 MIN   aspirin EC 81 MG tablet Take 81 mg by mouth every other day.   busPIRone (BUSPAR) 5 MG tablet TAKE 1 TABLET BY MOUTH ONCE DAILY   diltiazem (CARDIZEM CD) 120 MG 24 hr capsule Take 1 capsule (120 mg total) by mouth daily.   ergocalciferol (VITAMIN D2) 1.25 MG (50000 UT) capsule Take 1 capsule (50,000 Units total) by mouth once a week.   fluticasone (FLONASE) 50 MCG/ACT nasal spray    gabapentin (NEURONTIN) 100 MG capsule TAKE ONE CAPSULE BY MOUTH EVERY MORNING,ONE CAPSULE IN THE AFTERNOON, AND 3 CAPSULES AT BEDTIME.   latanoprost (XALATAN) 0.005 % ophthalmic solution 1 drop at bedtime.   letrozole (FEMARA) 2.5 MG tablet TAKE 1 TABLET BY MOUTH DAILY   lisinopril (ZESTRIL) 20 MG tablet TAKE ONE TABLET BY MOUTH EVERY MORNING AND ONE IN EVENING   Multiple Vitamin (MULTIVITAMIN WITH MINERALS) TABS tablet Take 1 tablet by mouth daily.    rosuvastatin (CRESTOR) 5 MG tablet Take 1 tablet (5 mg total) by mouth daily.   No facility-administered encounter medications on file  as of 06/29/2023.      Medical History: Past Medical History:  Diagnosis Date   Allergy    Arthritis    Breast cancer (HCC)    Diabetes mellitus without complication (HCC)    Hyperlipidemia    Hypertension    Personal history of radiation therapy    Seasonal allergies      Vital Signs: BP 136/76 Comment: 170/100  Pulse 91   Temp 97.8 F (36.6 C)   Resp 16   Ht 5\' 3"  (1.6 m)   Wt 135 lb (61.2 kg)   SpO2 96%   BMI 23.91 kg/m    Review of Systems  Constitutional: Negative.   HENT:  Positive for congestion, nosebleeds, postnasal drip and rhinorrhea.   Respiratory: Negative.  Negative for cough, chest tightness, shortness of breath and wheezing.   Cardiovascular: Negative.  Negative for chest pain and palpitations.  Genitourinary: Negative.   Psychiatric/Behavioral:  Positive for sleep disturbance. The patient is nervous/anxious.     Physical Exam Vitals reviewed.  Constitutional:      Appearance: Normal appearance. She is not ill-appearing.  HENT:     Head: Normocephalic and atraumatic.     Nose: Mucosal edema present.     Left Nostril: Epistaxis present.     Left Turbinates: Swollen.     Left Sinus: No maxillary sinus tenderness or frontal sinus tenderness.  Mouth/Throat:     Lips: Pink.  Eyes:     Pupils: Pupils are equal, round, and reactive to light.  Cardiovascular:     Rate and Rhythm: Normal rate and regular rhythm.  Pulmonary:     Effort: Pulmonary effort is normal. No respiratory distress.  Neurological:     Mental Status: She is alert and oriented to person, place, and time.  Psychiatric:        Mood and Affect: Mood normal.        Behavior: Behavior normal.       Assessment/Plan: 1. Left-sided epistaxis Use saline nasal spray daily in both nares. For acute nosebleed, spray 2-3 sprays of afrin nasal spray in the affected nare to stop the bleeding.   2. Allergic rhinitis with postnasal drip Continue allegra PO OTC.  Do not use steroid  nasal sprays due to nosebleeds   3. Aortic atherosclerosis (HCC) Start rosuvastatin as prescribed.  - rosuvastatin (CRESTOR) 5 MG tablet; Take 1 tablet (5 mg total) by mouth daily.  Dispense: 90 tablet; Refill: 1   General Counseling: Remington verbalizes understanding of the findings of todays visit and agrees with plan of treatment. I have discussed any further diagnostic evaluation that may be needed or ordered today. We also reviewed her medications today. she has been encouraged to call the office with any questions or concerns that should arise related to todays visit.    Counseling:    No orders of the defined types were placed in this encounter.   Meds ordered this encounter  Medications   rosuvastatin (CRESTOR) 5 MG tablet    Sig: Take 1 tablet (5 mg total) by mouth daily.    Dispense:  90 tablet    Refill:  1    Return if symptoms worsen or fail to improve.  [place] Controlled Substance Database was reviewed by me for overdose risk score (ORS)  Time spent:20 Minutes Time spent with patient included reviewing progress notes, labs, imaging studies, and discussing plan for follow up.   This patient was seen by Sallyanne Kuster, FNP-C in collaboration with Dr. Beverely Risen as a part of collaborative care agreement.  Ramandeep Arington R. Tedd Sias, MSN, FNP-C Internal Medicine

## 2023-07-22 DIAGNOSIS — R040 Epistaxis: Secondary | ICD-10-CM | POA: Diagnosis not present

## 2023-08-12 ENCOUNTER — Other Ambulatory Visit: Payer: Self-pay | Admitting: Oncology

## 2023-09-10 ENCOUNTER — Encounter: Payer: Self-pay | Admitting: Nurse Practitioner

## 2023-09-10 ENCOUNTER — Ambulatory Visit (INDEPENDENT_AMBULATORY_CARE_PROVIDER_SITE_OTHER): Payer: 59 | Admitting: Nurse Practitioner

## 2023-09-10 VITALS — BP 135/85 | HR 91 | Temp 98.3°F | Resp 16 | Ht 63.0 in | Wt 135.2 lb

## 2023-09-10 DIAGNOSIS — K219 Gastro-esophageal reflux disease without esophagitis: Secondary | ICD-10-CM | POA: Diagnosis not present

## 2023-09-10 MED ORDER — OMEPRAZOLE 20 MG PO CPDR
20.0000 mg | DELAYED_RELEASE_CAPSULE | Freq: Every day | ORAL | 2 refills | Status: DC
Start: 2023-09-10 — End: 2024-01-04

## 2023-09-10 NOTE — Progress Notes (Signed)
[facility] [address] [place], [place] [address]  Internal MEDICINE  Office Visit Note  Patient Name: Virginia Warner  [id number]  [id number]  Date of Service: 09/10/2023  Chief Complaint  Patient presents with   Acute Visit    Difficulty swallowing pills      HPI Virginia Warner presents for an acute sick visit for pills getting stuck Difficulty swallowing pills, also reports that water and food sometimes come up  Gets heartburn, has some regurgitation of food or water or pills.  Was previously told she may have acid reflux. Wants to try something for this to see if it helps.    Current Medication:  Outpatient Encounter Medications as of 09/10/2023  Medication Sig   acetaminophen (TYLENOL) 500 MG tablet Take 500 mg by mouth daily as needed for moderate pain.    alendronate (FOSAMAX) 70 MG tablet TAKE 1 TABLET EVERY 7 DAYS WITH A FULL GLASS OF WATER ON AN EMPTY STOMACH DO NOT LIE DOWN FOR AT LEAST 30 MIN   aspirin EC 81 MG tablet Take 81 mg by mouth every other day.   busPIRone (BUSPAR) 5 MG tablet TAKE 1 TABLET BY MOUTH ONCE DAILY   diltiazem (CARDIZEM CD) 120 MG 24 hr capsule Take 1 capsule (120 mg total) by mouth daily.   ergocalciferol (VITAMIN D2) 1.25 MG (50000 UT) capsule Take 1 capsule (50,000 Units total) by mouth once a week.   fluticasone (FLONASE) 50 MCG/ACT nasal spray    gabapentin (NEURONTIN) 100 MG capsule TAKE ONE CAPSULE BY MOUTH EVERY MORNING,ONE CAPSULE IN THE AFTERNOON, AND 3 CAPSULES AT BEDTIME.   latanoprost (XALATAN) 0.005 % ophthalmic solution 1 drop at bedtime.   letrozole (FEMARA) 2.5 MG tablet TAKE 1 TABLET BY MOUTH DAILY   lisinopril (ZESTRIL) 20 MG tablet TAKE ONE TABLET BY MOUTH EVERY MORNING AND ONE IN EVENING   Multiple Vitamin (MULTIVITAMIN WITH MINERALS) TABS tablet Take 1 tablet by mouth daily.    omeprazole (PRILOSEC) 20 MG capsule Take 1 capsule (20 mg total) by mouth daily.   rosuvastatin (CRESTOR) 5 MG tablet Take 1 tablet (5 mg  total) by mouth daily.   No facility-administered encounter medications on file as of 09/10/2023.      Medical History: Past Medical History:  Diagnosis Date   Allergy    Arthritis    Breast cancer (HCC)    Diabetes mellitus without complication (HCC)    Hyperlipidemia    Hypertension    Personal history of radiation therapy    Seasonal allergies      Vital Signs: BP 135/85 Comment: 150/90  Pulse 91   Temp 98.3 F (36.8 C)   Resp 16   Ht 5\' 3"  (1.6 m)   Wt 135 lb 3.2 oz (61.3 kg)   SpO2 94%   BMI 23.95 kg/m    Review of Systems  HENT:  Positive for trouble swallowing.   Respiratory: Negative.  Negative for cough, chest tightness, shortness of breath and wheezing.   Cardiovascular: Negative.  Negative for chest pain and palpitations.  Gastrointestinal:  Positive for nausea.       Acid reflux, heart burn, pills getting stuck  Psychiatric/Behavioral:  The patient is nervous/anxious.     Physical Exam Constitutional:      General: She is not in acute distress.    Appearance: Normal appearance. She is normal weight. She is not ill-appearing.  HENT:     Head: Normocephalic and atraumatic.  Eyes:     Pupils:  Pupils are equal, round, and reactive to light.  Cardiovascular:     Rate and Rhythm: Normal rate and regular rhythm.  Pulmonary:     Effort: Pulmonary effort is normal. No respiratory distress.  Neurological:     Mental Status: She is alert and oriented to person, place, and time.  Psychiatric:        Mood and Affect: Mood normal.        Behavior: Behavior normal.       Assessment/Plan: 1. Gastroesophageal reflux disease without esophagitis Start omeprazole as prescribed. Call clinic if no relief, give the medication a couple weeks.  - omeprazole (PRILOSEC) 20 MG capsule; Take 1 capsule (20 mg total) by mouth daily.  Dispense: 30 capsule; Refill: 2   General Counseling: Virginia Warner verbalizes understanding of the findings of todays visit and agrees with  plan of treatment. I have discussed any further diagnostic evaluation that may be needed or ordered today. We also reviewed her medications today. she has been encouraged to call the office with any questions or concerns that should arise related to todays visit.    Counseling:    No orders of the defined types were placed in this encounter.   Meds ordered this encounter  Medications   omeprazole (PRILOSEC) 20 MG capsule    Sig: Take 1 capsule (20 mg total) by mouth daily.    Dispense:  30 capsule    Refill:  2    Return if symptoms worsen or fail to improve.  [place] Controlled Substance Database was reviewed by me for overdose risk score (ORS)  Time spent:20 Minutes Time spent with patient included reviewing progress notes, labs, imaging studies, and discussing plan for follow up.   This patient was seen by Sallyanne Kuster, FNP-C in collaboration with Dr. Beverely Risen as a part of collaborative care agreement.  Asberry Lascola R. Tedd Sias, MSN, FNP-C Internal Medicine

## 2023-09-17 ENCOUNTER — Encounter: Payer: Self-pay | Admitting: Nurse Practitioner

## 2023-09-17 ENCOUNTER — Ambulatory Visit (INDEPENDENT_AMBULATORY_CARE_PROVIDER_SITE_OTHER): Payer: 59 | Admitting: Nurse Practitioner

## 2023-09-17 VITALS — BP 145/80 | HR 99 | Temp 98.4°F | Resp 16 | Ht 63.0 in | Wt 134.4 lb

## 2023-09-17 DIAGNOSIS — M5442 Lumbago with sciatica, left side: Secondary | ICD-10-CM | POA: Diagnosis not present

## 2023-09-17 DIAGNOSIS — R030 Elevated blood-pressure reading, without diagnosis of hypertension: Secondary | ICD-10-CM

## 2023-09-17 DIAGNOSIS — M5416 Radiculopathy, lumbar region: Secondary | ICD-10-CM | POA: Diagnosis not present

## 2023-09-17 DIAGNOSIS — G8929 Other chronic pain: Secondary | ICD-10-CM | POA: Diagnosis not present

## 2023-09-17 DIAGNOSIS — M5126 Other intervertebral disc displacement, lumbar region: Secondary | ICD-10-CM | POA: Diagnosis not present

## 2023-09-17 DIAGNOSIS — I10 Essential (primary) hypertension: Secondary | ICD-10-CM | POA: Diagnosis not present

## 2023-09-17 DIAGNOSIS — M5136 Other intervertebral disc degeneration, lumbar region: Secondary | ICD-10-CM | POA: Diagnosis not present

## 2023-09-17 DIAGNOSIS — M48062 Spinal stenosis, lumbar region with neurogenic claudication: Secondary | ICD-10-CM | POA: Diagnosis not present

## 2023-09-17 NOTE — Progress Notes (Signed)
[facility] [address] [place], [place] [address]  Internal MEDICINE  Office Visit Note  Patient Name: Virginia Warner  [id number]  [id number]  Date of Service: 09/17/2023  Chief Complaint  Patient presents with   Acute Visit    High bp     HPI Milley presents for an acute sick visit for high blood pressure Increased anxiety Had high BP at another clinic. Clinic called and asked what to do, had patient schedule an appointment.  BP improved when rechecked.     Current Medication:  Outpatient Encounter Medications as of 09/17/2023  Medication Sig   acetaminophen (TYLENOL) 500 MG tablet Take 500 mg by mouth daily as needed for moderate pain.    alendronate (FOSAMAX) 70 MG tablet TAKE 1 TABLET EVERY 7 DAYS WITH A FULL GLASS OF WATER ON AN EMPTY STOMACH DO NOT LIE DOWN FOR AT LEAST 30 MIN   aspirin EC 81 MG tablet Take 81 mg by mouth every other day.   busPIRone (BUSPAR) 5 MG tablet TAKE 1 TABLET BY MOUTH ONCE DAILY   diltiazem (CARDIZEM CD) 120 MG 24 hr capsule Take 1 capsule (120 mg total) by mouth daily.   ergocalciferol (VITAMIN D2) 1.25 MG (50000 UT) capsule Take 1 capsule (50,000 Units total) by mouth once a week.   fluticasone (FLONASE) 50 MCG/ACT nasal spray    gabapentin (NEURONTIN) 100 MG capsule TAKE ONE CAPSULE BY MOUTH EVERY MORNING,ONE CAPSULE IN THE AFTERNOON, AND 3 CAPSULES AT BEDTIME.   latanoprost (XALATAN) 0.005 % ophthalmic solution 1 drop at bedtime.   letrozole (FEMARA) 2.5 MG tablet TAKE 1 TABLET BY MOUTH DAILY   lisinopril (ZESTRIL) 20 MG tablet TAKE ONE TABLET BY MOUTH EVERY MORNING AND ONE IN EVENING   Multiple Vitamin (MULTIVITAMIN WITH MINERALS) TABS tablet Take 1 tablet by mouth daily.    omeprazole (PRILOSEC) 20 MG capsule Take 1 capsule (20 mg total) by mouth daily.   rosuvastatin (CRESTOR) 5 MG tablet Take 1 tablet (5 mg total) by mouth daily.   No facility-administered encounter medications on file as of 09/17/2023.      Medical  History: Past Medical History:  Diagnosis Date   Allergy    Arthritis    Breast cancer (HCC)    Diabetes mellitus without complication (HCC)    Hyperlipidemia    Hypertension    Personal history of radiation therapy    Seasonal allergies      Vital Signs: BP (!) 145/80 Comment: 160/80  Pulse 99   Temp 98.4 F (36.9 C)   Resp 16   Ht 5\' 3"  (1.6 m)   Wt 134 lb 6.4 oz (61 kg)   SpO2 98%   BMI 23.81 kg/m    Review of Systems  Constitutional:  Negative for appetite change, fatigue and fever.  HENT:  Negative for congestion, mouth sores and postnasal drip.   Respiratory: Negative.  Negative for cough, chest tightness and shortness of breath.   Cardiovascular: Negative.  Negative for chest pain and palpitations.  Gastrointestinal: Negative.   Genitourinary: Negative.  Negative for flank pain.  Musculoskeletal:  Positive for arthralgias.  Neurological: Negative.  Negative for headaches.  Psychiatric/Behavioral:  Negative for behavioral problems, self-injury, sleep disturbance and suicidal ideas. The patient is nervous/anxious.     Physical Exam Vitals reviewed.  Constitutional:      General: She is not in acute distress.    Appearance: Normal appearance. She is normal weight. She is not ill-appearing.  HENT:  Head: Normocephalic and atraumatic.  Eyes:     Pupils: Pupils are equal, round, and reactive to light.  Cardiovascular:     Rate and Rhythm: Normal rate and regular rhythm.  Pulmonary:     Effort: Pulmonary effort is normal. No respiratory distress.  Neurological:     Mental Status: She is alert and oriented to person, place, and time.  Psychiatric:        Mood and Affect: Mood is anxious.        Behavior: Behavior normal.       Assessment/Plan: 1. Essential hypertension Stable, improved when rechecked, instructed patient to go home and rest, may recheck BP if desired after resting for a couple of hours.  2. Elevated blood pressure reading High BP,  stress level and anxiety most likely contributed to elevated BP. Improved when rechecked.    General Counseling: analia denherder understanding of the findings of todays visit and agrees with plan of treatment. I have discussed any further diagnostic evaluation that may be needed or ordered today. We also reviewed her medications today. she has been encouraged to call the office with any questions or concerns that should arise related to todays visit.    Counseling:    No orders of the defined types were placed in this encounter.   No orders of the defined types were placed in this encounter.   Return if symptoms worsen or fail to improve.  [place] Controlled Substance Database was reviewed by me for overdose risk score (ORS)  Time spent:20 Minutes Time spent with patient included reviewing progress notes, labs, imaging studies, and discussing plan for follow up.   This patient was seen by Sallyanne Kuster, FNP-C in collaboration with Dr. Beverely Risen as a part of collaborative care agreement.  Alyrica Thurow R. Tedd Sias, MSN, FNP-C Internal Medicine

## 2023-09-18 ENCOUNTER — Other Ambulatory Visit: Payer: Self-pay | Admitting: Nurse Practitioner

## 2023-09-18 DIAGNOSIS — G5793 Unspecified mononeuropathy of bilateral lower limbs: Secondary | ICD-10-CM

## 2023-09-18 DIAGNOSIS — F411 Generalized anxiety disorder: Secondary | ICD-10-CM

## 2023-09-19 ENCOUNTER — Encounter: Payer: Self-pay | Admitting: Nurse Practitioner

## 2023-09-28 DIAGNOSIS — M5416 Radiculopathy, lumbar region: Secondary | ICD-10-CM | POA: Diagnosis not present

## 2023-09-28 DIAGNOSIS — M6281 Muscle weakness (generalized): Secondary | ICD-10-CM | POA: Diagnosis not present

## 2023-09-28 DIAGNOSIS — M545 Low back pain, unspecified: Secondary | ICD-10-CM | POA: Diagnosis not present

## 2023-10-02 DIAGNOSIS — M6281 Muscle weakness (generalized): Secondary | ICD-10-CM | POA: Diagnosis not present

## 2023-10-02 DIAGNOSIS — M545 Low back pain, unspecified: Secondary | ICD-10-CM | POA: Diagnosis not present

## 2023-10-02 DIAGNOSIS — M5416 Radiculopathy, lumbar region: Secondary | ICD-10-CM | POA: Diagnosis not present

## 2023-10-09 DIAGNOSIS — M6281 Muscle weakness (generalized): Secondary | ICD-10-CM | POA: Diagnosis not present

## 2023-10-09 DIAGNOSIS — M5416 Radiculopathy, lumbar region: Secondary | ICD-10-CM | POA: Diagnosis not present

## 2023-10-09 DIAGNOSIS — M545 Low back pain, unspecified: Secondary | ICD-10-CM | POA: Diagnosis not present

## 2023-10-13 DIAGNOSIS — M6281 Muscle weakness (generalized): Secondary | ICD-10-CM | POA: Diagnosis not present

## 2023-10-13 DIAGNOSIS — M5416 Radiculopathy, lumbar region: Secondary | ICD-10-CM | POA: Diagnosis not present

## 2023-10-13 DIAGNOSIS — M545 Low back pain, unspecified: Secondary | ICD-10-CM | POA: Diagnosis not present

## 2023-10-14 ENCOUNTER — Encounter: Payer: Self-pay | Admitting: Oncology

## 2023-10-14 ENCOUNTER — Inpatient Hospital Stay: Payer: 59 | Attending: Oncology | Admitting: Oncology

## 2023-10-14 VITALS — BP 156/85 | HR 88 | Temp 97.6°F | Resp 17 | Ht 63.0 in | Wt 131.6 lb

## 2023-10-14 DIAGNOSIS — C50912 Malignant neoplasm of unspecified site of left female breast: Secondary | ICD-10-CM | POA: Diagnosis not present

## 2023-10-14 DIAGNOSIS — Z79811 Long term (current) use of aromatase inhibitors: Secondary | ICD-10-CM | POA: Insufficient documentation

## 2023-10-14 DIAGNOSIS — Z87891 Personal history of nicotine dependence: Secondary | ICD-10-CM | POA: Insufficient documentation

## 2023-10-14 DIAGNOSIS — M85831 Other specified disorders of bone density and structure, right forearm: Secondary | ICD-10-CM | POA: Insufficient documentation

## 2023-10-14 DIAGNOSIS — Z08 Encounter for follow-up examination after completed treatment for malignant neoplasm: Secondary | ICD-10-CM | POA: Diagnosis not present

## 2023-10-14 DIAGNOSIS — Z923 Personal history of irradiation: Secondary | ICD-10-CM | POA: Insufficient documentation

## 2023-10-14 DIAGNOSIS — Z853 Personal history of malignant neoplasm of breast: Secondary | ICD-10-CM | POA: Diagnosis not present

## 2023-10-14 DIAGNOSIS — Z170 Estrogen receptor positive status [ER+]: Secondary | ICD-10-CM | POA: Diagnosis not present

## 2023-10-14 NOTE — Progress Notes (Signed)
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  This was followed by diagnostic mammogram and ultrasound which showed a 7 x 7 x 4 mm mass 3 cm from the nipple at the 7 o'clock position.  Normal-appearing left axillary lymph nodes.  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  ER strongly positive greater than 90%, PR 1 to 10% positive and HER-2 negative   Patient underwent lumpectomy and sentinel lymph node biopsy on 07/10/2020.  Final pathology showed 8 mm grade 1 invasive mucinous carcinoma with negative margins.  Sentinel lymph nodes were negative for malignancy.   Patient completed adjuvant radiation therapy and started letrozole in November 2021      Interval history-patient feels well overall and remains active for her age.  Denies any breast concerns other than occasional pain at the lumpectomy site.  Denies any new aches and pains anywhere.  ECOG PS- 1 Pain scale- 0   Review of systems- Review of Systems  Constitutional:   Negative for chills, fever, malaise/fatigue and weight loss.  HENT:  Negative for congestion, ear discharge and nosebleeds.   Eyes:  Negative for blurred vision.  Respiratory:  Negative for cough, hemoptysis, sputum production, shortness of breath and wheezing.   Cardiovascular:  Negative for chest pain, palpitations, orthopnea and claudication.  Gastrointestinal:  Negative for abdominal pain, blood in stool, constipation, diarrhea, heartburn, melena, nausea and vomiting.  Genitourinary:  Negative for dysuria, flank pain, frequency, hematuria and urgency.  Musculoskeletal:  Negative for back pain, joint pain and myalgias.  Skin:  Negative for rash.  Neurological:  Negative for dizziness, tingling, focal weakness, seizures, weakness and headaches.  Endo/Heme/Allergies:  Does not bruise/bleed easily.  Psychiatric/Behavioral:  Negative for depression and suicidal ideas. The patient does not have insomnia.       Allergies  Allergen Reactions   Aspirin Nausea And Vomiting    Cannot tolerate unless enteric coated   Atorvastatin Other (See Comments)    Bone pain     Past Medical History:  Diagnosis Date   Allergy    Arthritis    Breast cancer (HCC)    Diabetes mellitus without complication (HCC)    Hyperlipidemia    Hypertension    Personal history of radiation therapy    Seasonal allergies      Past Surgical History:  Procedure Laterality Date   ABDOMINAL HYSTERECTOMY     partial   BREAST BIOPSY Left 06/12/2020   [place] Bx, Ribbon Clip, [facility]   BREAST LUMPECTOMY Left 07/04/2020   [facility] lumpectomy with radiation   BREAST LUMPECTOMY,RADIO FREQ LOCALIZER,AXILLARY SENTINEL LYMPH NODE BIOPSY Left 07/10/2020   Procedure: BREAST LUMPECTOMY,RADIO FREQ LOCALIZER,AXILLARY  SENTINEL LYMPH NODE BIOPSY;  Surgeon: Leafy Ro, MD;  Location: [facility] ORS;  Service: General;  Laterality: Left;   COLONOSCOPY WITH PROPOFOL N/A 10/18/2015   Procedure: COLONOSCOPY WITH PROPOFOL;  Surgeon: Scot Jun, MD;  Location: [facility] ENDOSCOPY;  Service: Endoscopy;  Laterality: N/A;   COLONOSCOPY WITH PROPOFOL N/A 08/19/2021   Procedure: COLONOSCOPY WITH PROPOFOL;  Surgeon: Regis Bill, MD;  Location: [facility] ENDOSCOPY;  Service: Endoscopy;  Laterality: N/A;   INGUINAL HERNIA REPAIR     age 75's   POLYPECTOMY      Social History   Socioeconomic History   Marital status: Divorced    Spouse name: Not on file   Number of children: Not on file   Years of education: Not on file   Highest education level: Not on file  Occupational History   Not on file  Tobacco Use   Smoking status: Former    Current packs/day: 0.00    Types: Cigarettes    Quit date: 11/2007    Years since quitting: 15.8    Passive exposure: Past   Smokeless tobacco: Never  Vaping Use   Vaping status: Never Used  Substance and Sexual Activity   Alcohol use: Not Currently    Alcohol/week: 0.0 standard drinks of alcohol   Drug use: No   Sexual activity: Not Currently  Other Topics Concern   Not on file  Social History Narrative   Not on file   Social Determinants of Health   Financial Resource Strain: Not on file  Food Insecurity: Not on file  Transportation Needs: Not on file  Physical Activity: Not on file  Stress: Not on file  Social Connections: Not on file  Intimate Partner Violence: Not on file    Family History  Problem Relation Age of Onset   Cancer Brother    Breast cancer Paternal Aunt      Current Outpatient Medications:    acetaminophen (TYLENOL) 500 MG tablet, Take 500 mg by mouth daily as needed for moderate pain. , Disp: , Rfl:    alendronate (FOSAMAX) 70 MG tablet, TAKE 1 TABLET EVERY 7 DAYS WITH A FULL GLASS OF WATER ON AN EMPTY STOMACH DO NOT LIE DOWN FOR AT LEAST 30 MIN, Disp: 4 tablet, Rfl: 3   aspirin EC 81 MG tablet, Take 81 mg by mouth every other day., Disp: , Rfl:    busPIRone (BUSPAR) 5 MG tablet, TAKE 1 TABLET BY MOUTH ONCE DAILY, Disp: 30 tablet, Rfl: 2   diltiazem  (CARDIZEM CD) 120 MG 24 hr capsule, Take 1 capsule (120 mg total) by mouth daily., Disp: 90 capsule, Rfl: 3   ergocalciferol (VITAMIN D2) 1.25 MG (50000 UT) capsule, Take 1 capsule (50,000 Units total) by mouth once a week., Disp: 4 capsule, Rfl: 5   fluticasone (FLONASE) 50 MCG/ACT nasal spray, , Disp: , Rfl:    gabapentin (NEURONTIN) 100 MG capsule, TAKE ONE CAPSULE BY MOUTH EVERY MORNING,ONE CAPSULE IN THE AFTERNOON, AND 3 CAPSULES AT BEDTIME., Disp: 150 capsule, Rfl: 3   latanoprost (XALATAN) 0.005 % ophthalmic solution, 1 drop at bedtime., Disp: , Rfl:    letrozole (FEMARA) 2.5 MG tablet, TAKE 1 TABLET BY MOUTH DAILY, Disp: 30 tablet, Rfl: 2   lisinopril (ZESTRIL) 20 MG tablet, TAKE ONE TABLET BY MOUTH EVERY MORNING AND ONE IN EVENING, Disp: 180 tablet, Rfl: 3   Multiple Vitamin (MULTIVITAMIN WITH MINERALS) TABS tablet, Take 1 tablet by mouth daily. , Disp: , Rfl:  omeprazole (PRILOSEC) 20 MG capsule, Take 1 capsule (20 mg total) by mouth daily., Disp: 30 capsule, Rfl: 2   rosuvastatin (CRESTOR) 5 MG tablet, Take 1 tablet (5 mg total) by mouth daily., Disp: 90 tablet, Rfl: 1  Physical exam:  Vitals:   10/14/23 1126  BP: (!) 156/85  Pulse: 88  Resp: 17  Temp: 97.6 F (36.4 C)  TempSrc: Tympanic  SpO2: 98%  Weight: 131 lb 9.6 oz (59.7 kg)  Height: 5\' 3"  (1.6 m)   Physical Exam Cardiovascular:     Rate and Rhythm: Normal rate and regular rhythm.     Heart sounds: Normal heart sounds.  Pulmonary:     Effort: Pulmonary effort is normal.     Breath sounds: Normal breath sounds.  Abdominal:     General: Bowel sounds are normal.     Palpations: Abdomen is soft.  Skin:    General: Skin is warm and dry.  Neurological:     Mental Status: She is alert and oriented to person, place, and time.     Breast exam was performed in seated and lying down position. Patient is status post left lumpectomy with a well-healed surgical scar. No evidence of any palpable masses. No evidence of  axillary adenopathy. No evidence of any palpable masses or lumps in the right breast. No evidence of right axillary adenopathy       Latest Ref Rng & Units 06/17/2023    7:28 AM  CMP  Glucose 70 - 99 mg/dL 191   BUN 8 - 23 mg/dL 13   Creatinine 4.78 - 1.00 mg/dL 2.95   Sodium 621 - 308 mmol/L 143   Potassium 3.5 - 5.1 mmol/L 3.8   Chloride 98 - 111 mmol/L 105   CO2 22 - 32 mmol/L 28   Calcium 8.9 - 10.3 mg/dL 9.2   Total Protein 6.5 - 8.1 g/dL 7.9   Total Bilirubin 0.3 - 1.2 mg/dL 0.7   Alkaline Phos 38 - 126 U/L 39   AST 15 - 41 U/L 19   ALT 0 - 44 U/L 17       Latest Ref Rng & Units 06/17/2023    7:28 AM  CBC  WBC 4.0 - 10.5 K/uL 5.6   Hemoglobin 12.0 - 15.0 g/dL 65.7   Hematocrit 84.6 - 46.0 % 44.3   Platelets 150 - 400 K/uL 383      Assessment and plan- Patient is a 77 y.o. female with history of stage I left breast cancer ER/PR positive HER2 negative.  This is a routine follow-up visit of breast cancer on letrozole  Clinically patient is doing well with no concerning signs and symptoms of recurrence based on today's exam.  She started taking letrozole in November 2021 and will continue taking it for 2 more years ending in November 2026.Her mammogram from June 2024 was unremarkable.  Her baseline bone density scan from June 2023 showed osteopenia and she is on weekly Fosamax for the same.  I will see her back in 6 months no labs   Visit Diagnosis 1. Encounter for follow-up surveillance of breast cancer   2. Use of letrozole (Femara)   3. Osteopenia of right forearm      Dr. Owens Shark, MD, MPH [facility] [phone] 10/14/2023 12:54 PM

## 2023-10-15 DIAGNOSIS — M5416 Radiculopathy, lumbar region: Secondary | ICD-10-CM | POA: Diagnosis not present

## 2023-10-15 DIAGNOSIS — M6281 Muscle weakness (generalized): Secondary | ICD-10-CM | POA: Diagnosis not present

## 2023-10-15 DIAGNOSIS — M545 Low back pain, unspecified: Secondary | ICD-10-CM | POA: Diagnosis not present

## 2023-10-16 ENCOUNTER — Ambulatory Visit: Payer: 59 | Admitting: Nurse Practitioner

## 2023-10-16 ENCOUNTER — Encounter: Payer: Self-pay | Admitting: Nurse Practitioner

## 2023-10-16 VITALS — BP 135/86 | HR 88 | Temp 98.2°F | Resp 16 | Ht 63.0 in | Wt 133.2 lb

## 2023-10-16 DIAGNOSIS — R1319 Other dysphagia: Secondary | ICD-10-CM | POA: Diagnosis not present

## 2023-10-16 DIAGNOSIS — K219 Gastro-esophageal reflux disease without esophagitis: Secondary | ICD-10-CM

## 2023-10-16 NOTE — Progress Notes (Signed)
[facility] [address] [place], [place] [address]  Internal MEDICINE  Office Visit Note  Patient Name: Virginia Warner  [id number]  [id number]  Date of Service: 10/16/2023  Chief Complaint  Patient presents with   Acute Visit    Difficulty food/swallowing     HPI [name] presents for a follow-up visit for trouble swallowing. Having trouble swallowing pills, feels like it is getting stuck in her chest in the esophagus. Has felt like this off and on for months. Does not happen with drinks or most foods but often happens when she is taking her medications.      Current Medication: Outpatient Encounter Medications as of 10/16/2023  Medication Sig   acetaminophen (TYLENOL) 500 MG tablet Take 500 mg by mouth daily as needed for moderate pain.    alendronate (FOSAMAX) 70 MG tablet TAKE 1 TABLET EVERY 7 DAYS WITH A FULL GLASS OF WATER ON AN EMPTY STOMACH DO NOT LIE DOWN FOR AT LEAST 30 MIN   aspirin EC 81 MG tablet Take 81 mg by mouth every other day.   busPIRone (BUSPAR) 5 MG tablet TAKE 1 TABLET BY MOUTH ONCE DAILY   diltiazem (CARDIZEM CD) 120 MG 24 hr capsule Take 1 capsule (120 mg total) by mouth daily.   ergocalciferol (VITAMIN D2) 1.25 MG (50000 UT) capsule Take 1 capsule (50,000 Units total) by mouth once a week.   fluticasone (FLONASE) 50 MCG/ACT nasal spray    gabapentin (NEURONTIN) 100 MG capsule TAKE ONE CAPSULE BY MOUTH EVERY MORNING,ONE CAPSULE IN THE AFTERNOON, AND 3 CAPSULES AT BEDTIME.   latanoprost (XALATAN) 0.005 % ophthalmic solution 1 drop at bedtime.   letrozole (FEMARA) 2.5 MG tablet TAKE 1 TABLET BY MOUTH DAILY   lisinopril (ZESTRIL) 20 MG tablet TAKE ONE TABLET BY MOUTH EVERY MORNING AND ONE IN EVENING   Multiple Vitamin (MULTIVITAMIN WITH MINERALS) TABS tablet Take 1 tablet by mouth daily.    omeprazole (PRILOSEC) 20 MG capsule Take 1 capsule (20 mg total) by mouth daily.   rosuvastatin (CRESTOR) 5 MG tablet Take 1 tablet (5 mg total) by mouth  daily.   No facility-administered encounter medications on file as of 10/16/2023.    Surgical History: Past Surgical History:  Procedure Laterality Date   ABDOMINAL HYSTERECTOMY     partial   BREAST BIOPSY Left 06/12/2020   [place] Bx, Ribbon Clip, [facility]   BREAST LUMPECTOMY Left 07/04/2020   [facility] lumpectomy with radiation   BREAST LUMPECTOMY,RADIO FREQ LOCALIZER,AXILLARY SENTINEL LYMPH NODE BIOPSY Left 07/10/2020   Procedure: BREAST LUMPECTOMY,RADIO FREQ LOCALIZER,AXILLARY SENTINEL LYMPH NODE BIOPSY;  Surgeon: Leafy Ro, MD;  Location: [facility] ORS;  Service: General;  Laterality: Left;   COLONOSCOPY WITH PROPOFOL N/A 10/18/2015   Procedure: COLONOSCOPY WITH PROPOFOL;  Surgeon: Scot Jun, MD;  Location: [facility] ENDOSCOPY;  Service: Endoscopy;  Laterality: N/A;   COLONOSCOPY WITH PROPOFOL N/A 08/19/2021   Procedure: COLONOSCOPY WITH PROPOFOL;  Surgeon: Regis Bill, MD;  Location: [facility] ENDOSCOPY;  Service: Endoscopy;  Laterality: N/A;   INGUINAL HERNIA REPAIR     age 79's   POLYPECTOMY      Medical History: Past Medical History:  Diagnosis Date   Allergy    Arthritis    Breast cancer (HCC)    Diabetes mellitus without complication (HCC)    Hyperlipidemia    Hypertension    Personal history of radiation therapy    Seasonal allergies     Family History: Family History  Problem Relation Age of  Onset   Cancer Brother    Breast cancer Paternal Aunt     Social History   Socioeconomic History   Marital status: Divorced    Spouse name: Not on file   Number of children: Not on file   Years of education: Not on file   Highest education level: Not on file  Occupational History   Not on file  Tobacco Use   Smoking status: Former    Current packs/day: 0.00    Types: Cigarettes    Quit date: 11/2007    Years since quitting: 15.8    Passive exposure: Past   Smokeless tobacco: Never  Vaping Use   Vaping status: Never Used  Substance and Sexual Activity   Alcohol  use: Not Currently    Alcohol/week: 0.0 standard drinks of alcohol   Drug use: No   Sexual activity: Not Currently  Other Topics Concern   Not on file  Social History Narrative   Not on file   Social Determinants of Health   Financial Resource Strain: Not on file  Food Insecurity: Not on file  Transportation Needs: Not on file  Physical Activity: Not on file  Stress: Not on file  Social Connections: Not on file  Intimate Partner Violence: Not on file      Review of Systems  HENT:  Positive for trouble swallowing.   Respiratory: Negative.  Negative for cough, chest tightness, shortness of breath and wheezing.   Cardiovascular: Negative.  Negative for chest pain and palpitations.  Gastrointestinal:  Positive for nausea.       Acid reflux, heart burn, pills getting stuck  Psychiatric/Behavioral:  The patient is nervous/anxious.     Vital Signs: BP (!) 140/96   Pulse 88   Temp 98.2 F (36.8 C)   Resp 16   Ht 5\' 3"  (1.6 m)   Wt 133 lb 3.2 oz (60.4 kg)   SpO2 94%   BMI 23.60 kg/m    Physical Exam Vitals reviewed.  Constitutional:      General: She is not in acute distress.    Appearance: Normal appearance. She is normal weight. She is not ill-appearing.  HENT:     Head: Normocephalic and atraumatic.  Eyes:     Pupils: Pupils are equal, round, and reactive to light.  Cardiovascular:     Rate and Rhythm: Normal rate and regular rhythm.  Pulmonary:     Effort: Pulmonary effort is normal. No respiratory distress.  Neurological:     Mental Status: She is alert and oriented to person, place, and time.  Psychiatric:        Mood and Affect: Mood normal.        Behavior: Behavior normal.       Assessment/Plan: 1. Esophageal dysphagia Swallowing imaging ordered, then follow up to discuss results  - DG ESOPHAGUS W SINGLE CM (SOL OR THIN BA); Future  2. Gastroesophageal reflux disease without esophagitis Continue to take omeprazole as prescribed  - DG ESOPHAGUS  W SINGLE CM (SOL OR THIN BA); Future   General Counseling: cathyann walzer understanding of the findings of todays visit and agrees with plan of treatment. I have discussed any further diagnostic evaluation that may be needed or ordered today. We also reviewed her medications today. she has been encouraged to call the office with any questions or concerns that should arise related to todays visit.    Orders Placed This Encounter  Procedures   DG ESOPHAGUS W SINGLE CM (SOL OR THIN  BA)    No orders of the defined types were placed in this encounter.   Return for F/U, Brenda Cowher PCP swallowing imaging study results .   Total time spent:30 Minutes Time spent includes review of chart, medications, test results, and follow up plan with the patient.   [place] Controlled Substance Database was reviewed by me.  This patient was seen by Sallyanne Kuster, FNP-C in collaboration with Dr. Beverely Risen as a part of collaborative care agreement.   Aparna Vanderweele R. Tedd Sias, MSN, FNP-C Internal medicine

## 2023-10-17 ENCOUNTER — Other Ambulatory Visit: Payer: Self-pay | Admitting: Nurse Practitioner

## 2023-10-17 DIAGNOSIS — M810 Age-related osteoporosis without current pathological fracture: Secondary | ICD-10-CM

## 2023-10-20 DIAGNOSIS — M6281 Muscle weakness (generalized): Secondary | ICD-10-CM | POA: Diagnosis not present

## 2023-10-20 DIAGNOSIS — M545 Low back pain, unspecified: Secondary | ICD-10-CM | POA: Diagnosis not present

## 2023-10-20 DIAGNOSIS — M5416 Radiculopathy, lumbar region: Secondary | ICD-10-CM | POA: Diagnosis not present

## 2023-10-22 ENCOUNTER — Ambulatory Visit
Admission: RE | Admit: 2023-10-22 | Discharge: 2023-10-22 | Disposition: A | Discharge status: Home / Self Care | Payer: 59 | Source: Ambulatory Visit | Attending: Physician Assistant | Admitting: Physician Assistant

## 2023-10-22 ENCOUNTER — Other Ambulatory Visit: Payer: Self-pay

## 2023-10-22 ENCOUNTER — Ambulatory Visit
Admission: RE | Admit: 2023-10-22 | Discharge: 2023-10-22 | Disposition: A | Discharge status: Home / Self Care | Payer: 59 | Attending: Physician Assistant | Admitting: Physician Assistant

## 2023-10-22 ENCOUNTER — Encounter: Payer: Self-pay | Admitting: Physician Assistant

## 2023-10-22 ENCOUNTER — Ambulatory Visit: Payer: 59 | Admitting: Physician Assistant

## 2023-10-22 VITALS — BP 151/83 | HR 92

## 2023-10-22 DIAGNOSIS — Z87898 Personal history of other specified conditions: Secondary | ICD-10-CM | POA: Diagnosis not present

## 2023-10-22 DIAGNOSIS — Z87442 Personal history of urinary calculi: Secondary | ICD-10-CM | POA: Diagnosis not present

## 2023-10-22 DIAGNOSIS — N200 Calculus of kidney: Secondary | ICD-10-CM

## 2023-10-22 DIAGNOSIS — R82998 Other abnormal findings in urine: Secondary | ICD-10-CM | POA: Diagnosis not present

## 2023-10-22 DIAGNOSIS — R310 Gross hematuria: Secondary | ICD-10-CM

## 2023-10-22 LAB — MICROSCOPIC EXAMINATION

## 2023-10-22 LAB — URINALYSIS, COMPLETE
Bilirubin, UA: NEGATIVE
Glucose, UA: NEGATIVE
Ketones, UA: NEGATIVE
Nitrite, UA: NEGATIVE
Protein,UA: NEGATIVE
Specific Gravity, UA: <1.005 — ABNORMAL LOW (ref 1.005–1.030)
Urobilinogen, Ur: 0.2 mg/dL (ref 0.2–1.0)
pH, UA: 5.5 (ref 5.0–7.5)

## 2023-10-22 NOTE — Progress Notes (Signed)
10/22/2023 9:29 AM   Virginia Warner 12/25/1946 [id number]  CC: Chief Complaint  Patient presents with   Follow-up   HPI: Virginia Warner is a 77 y.o. female with PMH of gross hematuria with benign workup in 2022 and nonobstructing left renal calculi who presents today for annual follow-up.   Today she reports no acute concerns.  She states she had 1 episode of gross hematuria this year immediately after completing a PT treatment where her physical therapist was "pounding" on her back.  KUB today with stable size of left lower pole stones, however the more medial stones appear more prominent than prior.  In-office UA today positive for trace lysed blood and trace leukocytes; urine microscopy with 6-10 WBCs/HPF.  PMH: Past Medical History:  Diagnosis Date   Allergy    Arthritis    Breast cancer (HCC)    Diabetes mellitus without complication (HCC)    Hyperlipidemia    Hypertension    Personal history of radiation therapy    Seasonal allergies     Surgical History: Past Surgical History:  Procedure Laterality Date   ABDOMINAL HYSTERECTOMY     partial   BREAST BIOPSY Left 06/12/2020   [place] Bx, [facility], [facility]   BREAST LUMPECTOMY Left 07/04/2020   [facility] lumpectomy with radiation   BREAST LUMPECTOMY,RADIO FREQ LOCALIZER,AXILLARY SENTINEL LYMPH NODE BIOPSY Left 07/10/2020   Procedure: BREAST LUMPECTOMY,RADIO FREQ LOCALIZER,AXILLARY SENTINEL LYMPH NODE BIOPSY;  Surgeon: Leafy Ro, MD;  Location: [facility] ORS;  Service: General;  Laterality: Left;   COLONOSCOPY WITH PROPOFOL N/A 10/18/2015   Procedure: COLONOSCOPY WITH PROPOFOL;  Surgeon: Scot Jun, MD;  Location: [facility] ENDOSCOPY;  Service: Endoscopy;  Laterality: N/A;   COLONOSCOPY WITH PROPOFOL N/A 08/19/2021   Procedure: COLONOSCOPY WITH PROPOFOL;  Surgeon: Regis Bill, MD;  Location: [facility] ENDOSCOPY;  Service: Endoscopy;  Laterality: N/A;   INGUINAL HERNIA REPAIR     age 47's   POLYPECTOMY      Home  Medications:  Allergies as of 10/22/2023       Reactions   Aspirin Nausea And Vomiting   Cannot tolerate unless enteric coated   Atorvastatin Other (See Comments)   Bone pain        Medication List        Accurate as of October 22, 2023  9:29 AM. If you have any questions, ask your nurse or doctor.          acetaminophen 500 MG tablet Commonly known as: TYLENOL Take 500 mg by mouth daily as needed for moderate pain.   alendronate 70 MG tablet Commonly known as: FOSAMAX TAKE 1 TABLET EVERY 7 DAYS WITH A FULL GLASS OF WATER ON AN EMPTY STOMACH DO NOT LIE DOWN FOR AT LEAST 30 MIN   aspirin EC 81 MG tablet Take 81 mg by mouth every other day.   busPIRone 5 MG tablet Commonly known as: BUSPAR TAKE 1 TABLET BY MOUTH ONCE DAILY   diltiazem 120 MG 24 hr capsule Commonly known as: CARDIZEM CD Take 1 capsule (120 mg total) by mouth daily.   fluticasone 50 MCG/ACT nasal spray Commonly known as: FLONASE   gabapentin 100 MG capsule Commonly known as: NEURONTIN TAKE ONE CAPSULE BY MOUTH EVERY MORNING,ONE CAPSULE IN THE AFTERNOON, AND 3 CAPSULES AT BEDTIME.   latanoprost 0.005 % ophthalmic solution Commonly known as: XALATAN 1 drop at bedtime.   letrozole 2.5 MG tablet Commonly known as: FEMARA TAKE 1 TABLET BY MOUTH DAILY  lisinopril 20 MG tablet Commonly known as: ZESTRIL TAKE ONE TABLET BY MOUTH EVERY MORNING AND ONE IN EVENING   multivitamin with minerals Tabs tablet Take 1 tablet by mouth daily.   omeprazole 20 MG capsule Commonly known as: PRILOSEC Take 1 capsule (20 mg total) by mouth daily.   rosuvastatin 5 MG tablet Commonly known as: CRESTOR Take 1 tablet (5 mg total) by mouth daily.   Vitamin D (Ergocalciferol) 50000 units Caps TAKE 1 CAPSULE BY MOUTH ONCE A WEEK        Allergies:  Allergies  Allergen Reactions   Aspirin Nausea And Vomiting    Cannot tolerate unless enteric coated   Atorvastatin Other (See Comments)    Bone pain     Family History: Family History  Problem Relation Age of Onset   Cancer Brother    Breast cancer Paternal Aunt     Social History:   reports that she quit smoking about 15 years ago. Her smoking use included cigarettes. She has been exposed to tobacco smoke. She has never used smokeless tobacco. She reports that she does not currently use alcohol. She reports that she does not use drugs.  Physical Exam: BP (!) 151/83   Pulse 92   Constitutional:  Alert and oriented, no acute distress, nontoxic appearing HEENT: [place], AT Cardiovascular: No clubbing, cyanosis, or edema Respiratory: Normal respiratory effort, no increased work of breathing Skin: No rashes, bruises or suspicious lesions Neurologic: Grossly intact, no focal deficits, moving all 4 extremities Psychiatric: Normal mood and affect  Laboratory Data: Results for orders placed or performed in visit on 10/22/23  Microscopic Examination   Urine  Result Value Ref Range   WBC, UA 6-10 (A) 0 - 5 /hpf   RBC, Urine 0-2 0 - 2 /hpf   Epithelial Cells (non renal) 0-10 0 - 10 /hpf   Renal Epithel, UA 0-10 (A) None seen /hpf   Bacteria, UA Few None seen/Few  Urinalysis, Complete  Result Value Ref Range   Specific Gravity, UA <1.005 (L) 1.005 - 1.030   pH, UA 5.5 5.0 - 7.5   Color, UA Straw Yellow   Appearance Ur Clear Clear   Leukocytes,UA Trace (A) Negative   Protein,UA Negative Negative/Trace   Glucose, UA Negative Negative   Ketones, UA Negative Negative   RBC, UA Trace (A) Negative   Bilirubin, UA Negative Negative   Urobilinogen, Ur 0.2 0.2 - 1.0 mg/dL   Nitrite, UA Negative Negative   Microscopic Examination See below:    Pertinent Imaging: KUB, 10/22/2023: CLINICAL DATA:  History of left renal calculi, follow-up exam   EXAM: ABDOMEN - 1 VIEW   COMPARISON:  01/15/2023   FINDINGS: Stable lower pole renal calculi on the left are noted. No ureteral stones are seen. Stable scoliosis concave to the left in the  lumbar spine. Scattered large and small bowel gas is seen.   IMPRESSION: Stable lower pole renal calculi on the left.     Electronically Signed   By: Alcide Clever M.D.   On: 11/15/2023 00:42  I personally reviewed the images referenced above and stable size of left lower pole stones, with increased prominence of the more medial stones compared to prior.  Assessment & Plan:   1. Gross hematuria No micro heme today.  She reports an episode of gross hematuria within the past year associated with PT, possibly secondary to #2 below.  Will send urine for cytology today and defer repeat hematuria workup unless there are any  abnormalities on this. - Urinalysis, Complete  2. Kidney stones Stable size compared to prior.  Will see her back next year for annual follow-up.  We discussed that these could certainly be the source of #1 above.  Return in about 1 year (around 10/21/2024) for Annual f/u with UA, KUB prior.  Carman Ching, PA-C  [facility] [place] [address] [place], [place] [address] [phone]

## 2023-10-26 ENCOUNTER — Other Ambulatory Visit: Payer: Self-pay | Admitting: Nurse Practitioner

## 2023-10-26 ENCOUNTER — Ambulatory Visit
Admission: RE | Admit: 2023-10-26 | Discharge: 2023-10-26 | Disposition: A | Discharge status: Home / Self Care | Payer: 59 | Source: Ambulatory Visit | Attending: Nurse Practitioner | Admitting: Nurse Practitioner

## 2023-10-26 DIAGNOSIS — R1319 Other dysphagia: Secondary | ICD-10-CM

## 2023-10-26 DIAGNOSIS — K219 Gastro-esophageal reflux disease without esophagitis: Secondary | ICD-10-CM

## 2023-10-26 DIAGNOSIS — R1310 Dysphagia, unspecified: Secondary | ICD-10-CM | POA: Diagnosis not present

## 2023-10-26 DIAGNOSIS — K224 Dyskinesia of esophagus: Secondary | ICD-10-CM | POA: Diagnosis not present

## 2023-11-03 ENCOUNTER — Telehealth: Payer: Self-pay | Admitting: Nurse Practitioner

## 2023-11-03 DIAGNOSIS — K222 Esophageal obstruction: Secondary | ICD-10-CM

## 2023-11-03 NOTE — Telephone Encounter (Signed)
Discussed results of swallow study -- has mild to moderate esophageal stricture and moderate esophageal dysmotility

## 2023-11-04 ENCOUNTER — Telehealth: Payer: Self-pay | Admitting: Nurse Practitioner

## 2023-11-04 DIAGNOSIS — M5416 Radiculopathy, lumbar region: Secondary | ICD-10-CM | POA: Diagnosis not present

## 2023-11-04 DIAGNOSIS — M5126 Other intervertebral disc displacement, lumbar region: Secondary | ICD-10-CM | POA: Diagnosis not present

## 2023-11-04 DIAGNOSIS — M48062 Spinal stenosis, lumbar region with neurogenic claudication: Secondary | ICD-10-CM | POA: Diagnosis not present

## 2023-11-04 NOTE — Telephone Encounter (Signed)
Gastroenterology referral sent via [facility] to Dr. Mia Creek with [facility]. Lvm for patient. Gave pt telephone # [phone]) [phone]

## 2023-11-06 ENCOUNTER — Telehealth: Payer: Self-pay | Admitting: Nurse Practitioner

## 2023-11-06 NOTE — Telephone Encounter (Signed)
GI appointment 12/03/2023 @ [facility]

## 2023-11-09 ENCOUNTER — Other Ambulatory Visit: Payer: Self-pay | Admitting: Oncology

## 2023-11-29 ENCOUNTER — Encounter: Payer: Self-pay | Admitting: Nurse Practitioner

## 2023-12-01 DIAGNOSIS — H2513 Age-related nuclear cataract, bilateral: Secondary | ICD-10-CM | POA: Diagnosis not present

## 2023-12-01 DIAGNOSIS — H04123 Dry eye syndrome of bilateral lacrimal glands: Secondary | ICD-10-CM | POA: Diagnosis not present

## 2023-12-01 DIAGNOSIS — H40003 Preglaucoma, unspecified, bilateral: Secondary | ICD-10-CM | POA: Diagnosis not present

## 2023-12-03 ENCOUNTER — Ambulatory Visit: Payer: 59 | Admitting: Nurse Practitioner

## 2023-12-03 DIAGNOSIS — M5416 Radiculopathy, lumbar region: Secondary | ICD-10-CM | POA: Diagnosis not present

## 2023-12-03 DIAGNOSIS — M48062 Spinal stenosis, lumbar region with neurogenic claudication: Secondary | ICD-10-CM | POA: Diagnosis not present

## 2023-12-03 DIAGNOSIS — R1032 Left lower quadrant pain: Secondary | ICD-10-CM | POA: Diagnosis not present

## 2023-12-03 DIAGNOSIS — M5126 Other intervertebral disc displacement, lumbar region: Secondary | ICD-10-CM | POA: Diagnosis not present

## 2023-12-03 DIAGNOSIS — R9389 Abnormal findings on diagnostic imaging of other specified body structures: Secondary | ICD-10-CM | POA: Diagnosis not present

## 2023-12-04 ENCOUNTER — Ambulatory Visit (INDEPENDENT_AMBULATORY_CARE_PROVIDER_SITE_OTHER): Payer: 59 | Admitting: Nurse Practitioner

## 2023-12-04 ENCOUNTER — Encounter: Payer: Self-pay | Admitting: Nurse Practitioner

## 2023-12-04 VITALS — BP 127/79 | HR 85 | Temp 96.3°F | Resp 16 | Ht 63.0 in | Wt 133.2 lb

## 2023-12-04 DIAGNOSIS — K222 Esophageal obstruction: Secondary | ICD-10-CM | POA: Diagnosis not present

## 2023-12-04 DIAGNOSIS — I700 Atherosclerosis of aorta: Secondary | ICD-10-CM | POA: Diagnosis not present

## 2023-12-04 DIAGNOSIS — I10 Essential (primary) hypertension: Secondary | ICD-10-CM

## 2023-12-04 DIAGNOSIS — G720 Drug-induced myopathy: Secondary | ICD-10-CM | POA: Diagnosis not present

## 2023-12-04 DIAGNOSIS — E559 Vitamin D deficiency, unspecified: Secondary | ICD-10-CM | POA: Diagnosis not present

## 2023-12-04 DIAGNOSIS — T466X5A Adverse effect of antihyperlipidemic and antiarteriosclerotic drugs, initial encounter: Secondary | ICD-10-CM

## 2023-12-04 NOTE — Progress Notes (Signed)
[facility] [address] [place], [place] [address]  Internal MEDICINE  Office Visit Note  Patient Name: Virginia Warner  [id number]  [id number]  Date of Service: 12/04/2023  Chief Complaint  Patient presents with   Diabetes   Hypertension   Hyperlipidemia   Follow-up    Review swallowing test     HPI Virginia Warner presents for a follow-up visit for low vitamin D, high cholesterol, esophageal stricture, and hypertension.  Vitamin D deficiency -- has been taking weekly supplement or 6 months, needs labs rechecked.  High cholesterol -- declined rosuvastatin due to myopathy. Needs repeat lab Esophageal stricture -- seen by specialist, has endoscopy scheduled, will most likely have esophageal dilation done then.  Hypertension -- blood pressure is well controlled with lisinopril and diltiazem.     Current Medication: Outpatient Encounter Medications as of 12/04/2023  Medication Sig   acetaminophen (TYLENOL) 500 MG tablet Take 500 mg by mouth daily as needed for moderate pain.    alendronate (FOSAMAX) 70 MG tablet TAKE 1 TABLET EVERY 7 DAYS WITH A FULL GLASS OF WATER ON AN EMPTY STOMACH DO NOT LIE DOWN FOR AT LEAST 30 MIN   aspirin EC 81 MG tablet Take 81 mg by mouth every other day.   busPIRone (BUSPAR) 5 MG tablet TAKE 1 TABLET BY MOUTH ONCE DAILY   diltiazem (CARDIZEM CD) 120 MG 24 hr capsule Take 1 capsule (120 mg total) by mouth daily.   fluticasone (FLONASE) 50 MCG/ACT nasal spray    gabapentin (NEURONTIN) 100 MG capsule TAKE ONE CAPSULE BY MOUTH EVERY MORNING,ONE CAPSULE IN THE AFTERNOON, AND 3 CAPSULES AT BEDTIME.   latanoprost (XALATAN) 0.005 % ophthalmic solution 1 drop at bedtime.   letrozole (FEMARA) 2.5 MG tablet TAKE 1 TABLET BY MOUTH DAILY   lisinopril (ZESTRIL) 20 MG tablet TAKE ONE TABLET BY MOUTH EVERY MORNING AND ONE IN EVENING   Multiple Vitamin (MULTIVITAMIN WITH MINERALS) TABS tablet Take 1 tablet by mouth daily.    omeprazole (PRILOSEC) 20 MG capsule Take  1 capsule (20 mg total) by mouth daily.   rosuvastatin (CRESTOR) 5 MG tablet Take 1 tablet (5 mg total) by mouth daily.   Vitamin D, Ergocalciferol, 50000 units CAPS TAKE 1 CAPSULE BY MOUTH ONCE A WEEK   No facility-administered encounter medications on file as of 12/04/2023.    Surgical History: Past Surgical History:  Procedure Laterality Date   ABDOMINAL HYSTERECTOMY     partial   BREAST BIOPSY Left 06/12/2020   [place] Bx, Ribbon Clip, [facility]   BREAST LUMPECTOMY Left 07/04/2020   [facility] lumpectomy with radiation   BREAST LUMPECTOMY,RADIO FREQ LOCALIZER,AXILLARY SENTINEL LYMPH NODE BIOPSY Left 07/10/2020   Procedure: BREAST LUMPECTOMY,RADIO FREQ LOCALIZER,AXILLARY SENTINEL LYMPH NODE BIOPSY;  Surgeon: Leafy Ro, MD;  Location: [facility] ORS;  Service: General;  Laterality: Left;   COLONOSCOPY WITH PROPOFOL N/A 10/18/2015   Procedure: COLONOSCOPY WITH PROPOFOL;  Surgeon: Scot Jun, MD;  Location: [facility] ENDOSCOPY;  Service: Endoscopy;  Laterality: N/A;   COLONOSCOPY WITH PROPOFOL N/A 08/19/2021   Procedure: COLONOSCOPY WITH PROPOFOL;  Surgeon: Regis Bill, MD;  Location: [facility] ENDOSCOPY;  Service: Endoscopy;  Laterality: N/A;   INGUINAL HERNIA REPAIR     age 63's   POLYPECTOMY      Medical History: Past Medical History:  Diagnosis Date   Allergy    Arthritis    Breast cancer (HCC)    Diabetes mellitus without complication (HCC)    Hyperlipidemia    Hypertension  Personal history of radiation therapy    Seasonal allergies     Family History: Family History  Problem Relation Age of Onset   Cancer Brother    Breast cancer Paternal Aunt     Social History   Socioeconomic History   Marital status: Divorced    Spouse name: Not on file   Number of children: Not on file   Years of education: Not on file   Highest education level: Not on file  Occupational History   Not on file  Tobacco Use   Smoking status: Former    Current packs/day: 0.00    Types: Cigarettes     Quit date: 11/2007    Years since quitting: 16.0    Passive exposure: Past   Smokeless tobacco: Never  Vaping Use   Vaping status: Never Used  Substance and Sexual Activity   Alcohol use: Not Currently    Alcohol/week: 0.0 standard drinks of alcohol   Drug use: No   Sexual activity: Not Currently  Other Topics Concern   Not on file  Social History Narrative   Not on file   Social Determinants of Health   Financial Resource Strain: Not on file  Food Insecurity: Not on file  Transportation Needs: Not on file  Physical Activity: Not on file  Stress: Not on file  Social Connections: Not on file  Intimate Partner Violence: Not on file      Review of Systems  HENT:  Positive for trouble swallowing.   Respiratory: Negative.  Negative for cough, chest tightness, shortness of breath and wheezing.   Cardiovascular: Negative.  Negative for chest pain and palpitations.  Gastrointestinal:  Positive for nausea.       Acid reflux, heart burn, pills getting stuck  Psychiatric/Behavioral:  The patient is nervous/anxious.     Vital Signs: BP 127/79   Pulse 85   Temp (!) 96.3 F (35.7 C)   Resp 16   Ht 5\' 3"  (1.6 m)   Wt 133 lb 3.2 oz (60.4 kg)   SpO2 96%   BMI 23.60 kg/m    Physical Exam Vitals reviewed.  Constitutional:      General: She is not in acute distress.    Appearance: Normal appearance. She is normal weight. She is not ill-appearing.  HENT:     Head: Normocephalic and atraumatic.  Eyes:     Pupils: Pupils are equal, round, and reactive to light.  Cardiovascular:     Rate and Rhythm: Normal rate and regular rhythm.  Pulmonary:     Effort: Pulmonary effort is normal. No respiratory distress.  Neurological:     Mental Status: She is alert and oriented to person, place, and time.  Psychiatric:        Mood and Affect: Mood normal.        Behavior: Behavior normal.        Assessment/Plan: 1. Essential hypertension Stable, continue lisinopril and  diltiazem as prescribed.   2. Esophageal stricture Continue to follow up with specialist. Continue omeprazole as prescribed.   3. Aortic atherosclerosis (HCC) Repeat lipid panel, will then prescribe alternative to statin therapy.  - Lipid Profile  4. Vitamin D deficiency Repeat vitamin D level.  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 I have discussed any further diagnostic evaluation that may be needed or ordered  today. We also reviewed her medications today. she has been encouraged to call the office with any questions or concerns that should arise related to todays visit.    Orders Placed This Encounter  Procedures   Lipid Profile   Vitamin D (25 hydroxy)    No orders of the defined types were placed in this encounter.   Return in about 3 months (around 03/03/2024) for F/U, Cattleya Dobratz PCP.   Total time spent:30 Minutes Time spent includes review of chart, medications, test results, and follow up plan with the patient.   [place] Controlled Substance Database was reviewed by me.  This patient was seen by Sallyanne Kuster, FNP-C in collaboration with Dr. Beverely Risen as a part of collaborative care agreement.   Ishia Tenorio R. Tedd Sias, MSN, FNP-C Internal medicine

## 2023-12-08 ENCOUNTER — Ambulatory Visit: Payer: 59 | Admitting: Nurse Practitioner

## 2023-12-15 ENCOUNTER — Other Ambulatory Visit: Payer: Self-pay | Admitting: Nurse Practitioner

## 2023-12-15 DIAGNOSIS — F411 Generalized anxiety disorder: Secondary | ICD-10-CM

## 2024-01-02 ENCOUNTER — Other Ambulatory Visit: Payer: Self-pay | Admitting: Nurse Practitioner

## 2024-01-02 DIAGNOSIS — K219 Gastro-esophageal reflux disease without esophagitis: Secondary | ICD-10-CM

## 2024-01-06 ENCOUNTER — Other Ambulatory Visit
Admission: RE | Admit: 2024-01-06 | Discharge: 2024-01-06 | Disposition: A | Discharge status: Home / Self Care | Payer: 59 | Attending: Nurse Practitioner | Admitting: Nurse Practitioner

## 2024-01-06 DIAGNOSIS — E559 Vitamin D deficiency, unspecified: Secondary | ICD-10-CM | POA: Diagnosis not present

## 2024-01-06 DIAGNOSIS — I700 Atherosclerosis of aorta: Secondary | ICD-10-CM | POA: Insufficient documentation

## 2024-01-06 LAB — LIPID PANEL
Cholesterol: 252 mg/dL — ABNORMAL HIGH (ref 0–200)
HDL: 54 mg/dL (ref >40)
LDL Cholesterol: 178 mg/dL — ABNORMAL HIGH (ref 0–99)
Total CHOL/HDL Ratio: 4.7 {ratio}
Triglycerides: 102 mg/dL (ref <150)
VLDL: 20 mg/dL (ref 0–40)

## 2024-01-06 LAB — VITAMIN D 25 HYDROXY (VIT D DEFICIENCY, FRACTURES): Vit D, 25-Hydroxy: 68 ng/mL (ref 30–100)

## 2024-01-27 ENCOUNTER — Other Ambulatory Visit: Payer: Self-pay | Admitting: Oncology

## 2024-02-10 ENCOUNTER — Other Ambulatory Visit: Payer: Self-pay | Admitting: Nurse Practitioner

## 2024-02-10 DIAGNOSIS — M810 Age-related osteoporosis without current pathological fracture: Secondary | ICD-10-CM

## 2024-02-10 DIAGNOSIS — G5793 Unspecified mononeuropathy of bilateral lower limbs: Secondary | ICD-10-CM

## 2024-02-16 ENCOUNTER — Telehealth: Payer: Self-pay | Admitting: Nurse Practitioner

## 2024-02-16 NOTE — Progress Notes (Signed)
Will discuss results at her next office visit.

## 2024-02-17 ENCOUNTER — Telehealth (INDEPENDENT_AMBULATORY_CARE_PROVIDER_SITE_OTHER): Payer: 59 | Admitting: Nurse Practitioner

## 2024-02-17 ENCOUNTER — Encounter: Payer: Self-pay | Admitting: Nurse Practitioner

## 2024-02-17 VITALS — Resp 16 | Ht 63.0 in | Wt 130.0 lb

## 2024-02-17 DIAGNOSIS — J0101 Acute recurrent maxillary sinusitis: Secondary | ICD-10-CM | POA: Diagnosis not present

## 2024-02-17 MED ORDER — AZITHROMYCIN 250 MG PO TABS
ORAL_TABLET | ORAL | 0 refills | Status: AC
Start: 2024-02-17 — End: 2024-02-22

## 2024-02-17 NOTE — Progress Notes (Signed)
[facility] [address] [place], [place] [address]  Internal MEDICINE  Telephone Visit  Patient Name: Virginia Warner  [id number]  [id number]  Date of Service: 02/17/2024  I connected with the patient at 0815 by telephone and verified the patients identity using two identifiers.   I discussed the limitations, risks, security and privacy concerns of performing an evaluation and management service by telephone and the availability of in person appointments. I also discussed with the patient that there may be a patient responsible charge related to the service.  The patient expressed understanding and agrees to proceed.    Chief Complaint  Patient presents with   Telephone Screen    Sinus infection, head congestion and nose runny   Telephone Assessment    HPI Virginia Warner presents for a telehealth virtual visit for symptoms of sinusitis --Onset of symptoms was a few days  --reports cough, sore throat, headache, head congestion, runny nose, sinus pressure/pain, nasal congestion, fatigue, ear pain.  --Did not test for covid   Current Medication: Outpatient Encounter Medications as of 02/17/2024  Medication Sig   azithromycin (ZITHROMAX) 250 MG tablet Take 2 tablets on day 1, then 1 tablet daily on days 2 through 5   acetaminophen (TYLENOL) 500 MG tablet Take 500 mg by mouth daily as needed for moderate pain.    alendronate (FOSAMAX) 70 MG tablet TAKE 1 TABLET EVERY 7 DAYS WITH A FULL GLASS OF WATER ON AN EMPTY STOMACH DO NOT LIE DOWN FOR AT LEAST 30 MIN   aspirin EC 81 MG tablet Take 81 mg by mouth every other day.   busPIRone (BUSPAR) 5 MG tablet TAKE 1 TABLET BY MOUTH ONCE DAILY   diltiazem (CARDIZEM CD) 120 MG 24 hr capsule Take 1 capsule (120 mg total) by mouth daily.   fluticasone (FLONASE) 50 MCG/ACT nasal spray    gabapentin (NEURONTIN) 100 MG capsule TAKE ONE CAPSULE BY MOUTH EVERY MORNING,ONE CAPSULE IN THE AFTERNOON, AND 3 CAPSULES AT BEDTIME.   latanoprost (XALATAN)  0.005 % ophthalmic solution 1 drop at bedtime.   letrozole (FEMARA) 2.5 MG tablet TAKE 1 TABLET BY MOUTH DAILY   lisinopril (ZESTRIL) 20 MG tablet TAKE ONE TABLET BY MOUTH EVERY MORNING AND ONE IN EVENING   Multiple Vitamin (MULTIVITAMIN WITH MINERALS) TABS tablet Take 1 tablet by mouth daily.    omeprazole (PRILOSEC) 20 MG capsule TAKE 1 CAPSULE BY MOUTH DAILY.   rosuvastatin (CRESTOR) 5 MG tablet Take 1 tablet (5 mg total) by mouth daily.   Vitamin D, Ergocalciferol, 50000 units CAPS TAKE 1 CAPSULE BY MOUTH ONCE A WEEK   No facility-administered encounter medications on file as of 02/17/2024.    Surgical History: Past Surgical History:  Procedure Laterality Date   ABDOMINAL HYSTERECTOMY     partial   BREAST BIOPSY Left 06/12/2020   [place] Bx, Ribbon Clip, [facility]   BREAST LUMPECTOMY Left 07/04/2020   [facility] lumpectomy with radiation   BREAST LUMPECTOMY,RADIO FREQ LOCALIZER,AXILLARY SENTINEL LYMPH NODE BIOPSY Left 07/10/2020   Procedure: BREAST LUMPECTOMY,RADIO FREQ LOCALIZER,AXILLARY SENTINEL LYMPH NODE BIOPSY;  Surgeon: Leafy Ro, MD;  Location: [facility] ORS;  Service: General;  Laterality: Left;   COLONOSCOPY WITH PROPOFOL N/A 10/18/2015   Procedure: COLONOSCOPY WITH PROPOFOL;  Surgeon: Scot Jun, MD;  Location: [facility] ENDOSCOPY;  Service: Endoscopy;  Laterality: N/A;   COLONOSCOPY WITH PROPOFOL N/A 08/19/2021   Procedure: COLONOSCOPY WITH PROPOFOL;  Surgeon: Regis Bill, MD;  Location: [facility] ENDOSCOPY;  Service: Endoscopy;  Laterality: N/A;  INGUINAL HERNIA REPAIR     age 71's   POLYPECTOMY      Medical History: Past Medical History:  Diagnosis Date   Allergy    Arthritis    Breast cancer (HCC)    Diabetes mellitus without complication (HCC)    Hyperlipidemia    Hypertension    Personal history of radiation therapy    Seasonal allergies     Family History: Family History  Problem Relation Age of Onset   Cancer Brother    Breast cancer Paternal Aunt     Social  History   Socioeconomic History   Marital status: Divorced    Spouse name: Not on file   Number of children: Not on file   Years of education: Not on file   Highest education level: Not on file  Occupational History   Not on file  Tobacco Use   Smoking status: Former    Current packs/day: 0.00    Types: Cigarettes    Quit date: 11/2007    Years since quitting: 16.2    Passive exposure: Past   Smokeless tobacco: Never  Vaping Use   Vaping status: Never Used  Substance and Sexual Activity   Alcohol use: Not Currently    Alcohol/week: 0.0 standard drinks of alcohol   Drug use: No   Sexual activity: Not Currently  Other Topics Concern   Not on file  Social History Narrative   Not on file   Social Drivers of Health   Financial Resource Strain: Not on file  Food Insecurity: Not on file  Transportation Needs: Not on file  Physical Activity: Not on file  Stress: Not on file  Social Connections: Not on file  Intimate Partner Violence: Not on file      Review of Systems  Constitutional:  Positive for fatigue. Negative for chills and fever.  HENT:  Positive for congestion, ear pain, postnasal drip, rhinorrhea, sinus pressure, sinus pain and sore throat. Negative for trouble swallowing.   Respiratory:  Positive for cough. Negative for chest tightness, shortness of breath and wheezing.   Cardiovascular: Negative.  Negative for chest pain and palpitations.  Gastrointestinal: Negative.  Negative for diarrhea, nausea and vomiting.  Neurological:  Positive for headaches.    Vital Signs: Resp 16   Ht 5\' 3"  (1.6 m)   Wt 130 lb (59 kg)   BMI 23.03 kg/m    Observation/Objective: She is alert and oriented. No acute distress noted.   Assessment/Plan: 1. Acute recurrent maxillary sinusitis (Primary) Zpak prescribed, take until gone  - azithromycin (ZITHROMAX) 250 MG tablet; Take 2 tablets on day 1, then 1 tablet daily on days 2 through 5  Dispense: 6 tablet; Refill:  0   General Counseling: Letitia Neri understanding of the findings of today's phone visit and agrees with plan of treatment. I have discussed any further diagnostic evaluation that may be needed or ordered today. We also reviewed her medications today. she has been encouraged to call the office with any questions or concerns that should arise related to todays visit.  Return if symptoms worsen or fail to improve.   No orders of the defined types were placed in this encounter.   Meds ordered this encounter  Medications   azithromycin (ZITHROMAX) 250 MG tablet    Sig: Take 2 tablets on day 1, then 1 tablet daily on days 2 through 5    Dispense:  6 tablet    Refill:  0    Time spent:10 Minutes  Time spent with patient included reviewing progress notes, labs, imaging studies, and discussing plan for follow up.  [place] Controlled Substance Database was reviewed by me for overdose risk score (ORS) if appropriate.  This patient was seen by Sallyanne Kuster, FNP-C in collaboration with Dr. Beverely Risen as a part of collaborative care agreement.  Jernie Schutt R. Tedd Sias, MSN, FNP-C Internal medicine

## 2024-02-19 ENCOUNTER — Encounter: Payer: Self-pay | Admitting: *Deleted

## 2024-02-24 ENCOUNTER — Other Ambulatory Visit: Payer: Self-pay | Admitting: Nurse Practitioner

## 2024-02-26 NOTE — Telephone Encounter (Signed)
 error

## 2024-03-07 ENCOUNTER — Ambulatory Visit
Admission: RE | Admit: 2024-03-07 | Discharge: 2024-03-07 | Disposition: A | Discharge status: Home / Self Care | Payer: 59 | Attending: Gastroenterology | Admitting: Gastroenterology

## 2024-03-07 ENCOUNTER — Encounter
Admission: RE | Disposition: A | Discharge status: Home / Self Care | Payer: Self-pay | Source: Home / Self Care | Attending: Gastroenterology

## 2024-03-07 ENCOUNTER — Ambulatory Visit: Admitting: Anesthesiology

## 2024-03-07 ENCOUNTER — Encounter: Payer: Self-pay | Admitting: *Deleted

## 2024-03-07 ENCOUNTER — Telehealth: Payer: Self-pay | Admitting: Physician Assistant

## 2024-03-07 DIAGNOSIS — K219 Gastro-esophageal reflux disease without esophagitis: Secondary | ICD-10-CM | POA: Insufficient documentation

## 2024-03-07 DIAGNOSIS — Z853 Personal history of malignant neoplasm of breast: Secondary | ICD-10-CM | POA: Diagnosis not present

## 2024-03-07 DIAGNOSIS — K449 Diaphragmatic hernia without obstruction or gangrene: Secondary | ICD-10-CM | POA: Diagnosis not present

## 2024-03-07 DIAGNOSIS — R933 Abnormal findings on diagnostic imaging of other parts of digestive tract: Secondary | ICD-10-CM | POA: Diagnosis present

## 2024-03-07 DIAGNOSIS — E785 Hyperlipidemia, unspecified: Secondary | ICD-10-CM | POA: Diagnosis not present

## 2024-03-07 DIAGNOSIS — Z87891 Personal history of nicotine dependence: Secondary | ICD-10-CM | POA: Insufficient documentation

## 2024-03-07 DIAGNOSIS — Z79899 Other long term (current) drug therapy: Secondary | ICD-10-CM | POA: Insufficient documentation

## 2024-03-07 DIAGNOSIS — K222 Esophageal obstruction: Secondary | ICD-10-CM | POA: Diagnosis not present

## 2024-03-07 DIAGNOSIS — R9389 Abnormal findings on diagnostic imaging of other specified body structures: Secondary | ICD-10-CM | POA: Diagnosis not present

## 2024-03-07 DIAGNOSIS — I10 Essential (primary) hypertension: Secondary | ICD-10-CM | POA: Diagnosis not present

## 2024-03-07 HISTORY — DX: 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 breast: C50.512

## 2024-03-07 HISTORY — DX: Adjustment insomnia: F51.02

## 2024-03-07 HISTORY — DX: Corns and callosities: L84

## 2024-03-07 HISTORY — DX: Generalized enlarged lymph nodes: R59.1

## 2024-03-07 HISTORY — DX: Gastro-esophageal reflux disease without esophagitis: K21.9

## 2024-03-07 HISTORY — DX: Age-related osteoporosis without current pathological fracture: M81.0

## 2024-03-07 HISTORY — DX: Other specified bacterial agents as the cause of diseases classified elsewhere: B96.89

## 2024-03-07 HISTORY — DX: Polyp of colon: K63.5

## 2024-03-07 HISTORY — DX: Personal history of urinary calculi: Z87.442

## 2024-03-07 HISTORY — DX: Congenital deformity of feet, unspecified, unspecified foot: Q66.90

## 2024-03-07 HISTORY — DX: Prediabetes: R73.03

## 2024-03-07 HISTORY — PX: ESOPHAGOGASTRODUODENOSCOPY (EGD) WITH PROPOFOL: SHX5813

## 2024-03-07 HISTORY — DX: Calculus of kidney: N20.0

## 2024-03-07 LAB — GLUCOSE, CAPILLARY: Glucose-Capillary: 93 mg/dL (ref 70–99)

## 2024-03-07 SURGERY — ESOPHAGOGASTRODUODENOSCOPY (EGD) WITH PROPOFOL
Anesthesia: General

## 2024-03-07 MED ORDER — PROPOFOL 1000 MG/100ML IV EMUL
INTRAVENOUS | Status: AC
  Filled 2024-03-07: qty 100

## 2024-03-07 MED ORDER — PROPOFOL 10 MG/ML IV BOLUS
INTRAVENOUS | Status: DC | PRN
  Administered 2024-03-07: 50 mg via INTRAVENOUS

## 2024-03-07 MED ORDER — PROPOFOL 500 MG/50ML IV EMUL
INTRAVENOUS | Status: DC | PRN
  Administered 2024-03-07: 120 ug/kg/min via INTRAVENOUS

## 2024-03-07 MED ORDER — SODIUM CHLORIDE 0.9 % IV SOLN: INTRAVENOUS | Status: DC

## 2024-03-07 MED ORDER — LIDOCAINE HCL (CARDIAC) PF 100 MG/5ML IV SOSY
PREFILLED_SYRINGE | INTRAVENOUS | Status: DC | PRN
  Administered 2024-03-07: 50 mg via INTRAVENOUS

## 2024-03-07 MED ORDER — LIDOCAINE HCL (PF) 2 % IJ SOLN
INTRAMUSCULAR | Status: AC
  Filled 2024-03-07: qty 5

## 2024-03-07 NOTE — Op Note (Signed)
 [facility] Patient Name: Virginia Warner Procedure Date: 03/07/2024 8:18 AM MRN: [id number] Account #: [id number] Date of Birth: 01/04/1946 Admit Type: Outpatient Age: 78 Room: [facility] ENDO ROOM 3 Gender: Female Note Status: Finalized Instrument Name: Laurette Schimke [id number] Procedure:             Upper GI endoscopy Indications:           Abnormal cine-esophagram Providers:             Eather Colas MD, MD Referring MD:          Sallyanne Kuster (Referring MD) Medicines:             Monitored Anesthesia Care Complications:         No immediate complications. Estimated blood loss:                         Minimal. Procedure:             Pre-Anesthesia Assessment:                        - Prior to the procedure, a History and Physical was                         performed, and patient medications and allergies were                         reviewed. The patient is competent. The risks and                         benefits of the procedure and the sedation options and                         risks were discussed with the patient. All questions                         were answered and informed consent was obtained.                         Patient identification and proposed procedure were                         verified by the physician, the nurse, the                         anesthesiologist, the anesthetist and the technician                         in the endoscopy suite. Mental Status Examination:                         alert and oriented. Airway Examination: normal                         oropharyngeal airway and neck mobility. Respiratory                         Examination: clear to auscultation. CV Examination:  normal. Prophylactic Antibiotics: The patient does not                         require prophylactic antibiotics. Prior                         Anticoagulants: The patient has taken no anticoagulant                          or antiplatelet agents. ASA Grade Assessment: II - A                         patient with mild systemic disease. After reviewing                         the risks and benefits, the patient was deemed in                         satisfactory condition to undergo the procedure. The                         anesthesia plan was to use monitored anesthesia care                         (MAC). Immediately prior to administration of                         medications, the patient was re-assessed for adequacy                         to receive sedatives. The heart rate, respiratory                         rate, oxygen saturations, blood pressure, adequacy of                         pulmonary ventilation, and response to care were                         monitored throughout the procedure. The physical                         status of the patient was re-assessed after the                         procedure.                        After obtaining informed consent, the endoscope was                         passed under direct vision. Throughout the procedure,                         the patient's blood pressure, pulse, and oxygen                         saturations were monitored continuously. The Endoscope  was introduced through the mouth, and advanced to the                         second part of duodenum. The upper GI endoscopy was                         accomplished without difficulty. The patient tolerated                         the procedure well. Findings:      A obstructing Schatzki ring was found in the lower third of the       esophagus. Dilated with gentle pressure from the endoscope. Estimated       blood loss was minimal.      A 3 cm hiatal hernia was present.      The entire examined stomach was normal.      The examined duodenum was normal. Impression:            - Obstructing Schatzki ring.                        - 3 cm hiatal hernia.                         - Normal stomach.                        - Normal examined duodenum.                        - No specimens collected. Recommendation:        - Discharge patient to home.                        - Resume previous diet.                        - Continue present medications.                        - Repeat upper endoscopy in 1 month for retreatment.                        - Return to referring physician as previously                         scheduled. Procedure Code(s):     --- Professional ---                        [phone], Esophagogastroduodenoscopy, flexible,                         transoral; diagnostic, including collection of                         specimen(s) by brushing or washing, when performed                         (separate procedure) Diagnosis Code(s):     --- Professional ---  K22.2, Esophageal obstruction                        K44.9, Diaphragmatic hernia without obstruction or                         gangrene                        R93.3, Abnormal findings on diagnostic imaging of                         other parts of digestive tract CPT copyright 2022 American [employer]. All rights reserved. The codes documented in this report are preliminary and upon coder review may  be revised to meet current compliance requirements. Eather Colas MD, MD 03/07/2024 8:47:37 AM Number of Addenda: 0 Note Initiated On: 03/07/2024 8:18 AM Estimated Blood Loss:  Estimated blood loss was minimal.      [facility]

## 2024-03-07 NOTE — Anesthesia Preprocedure Evaluation (Signed)
 Anesthesia Evaluation  Patient identified by MRN, date of birth, ID band Patient awake    Reviewed: Allergy & Precautions, NPO status , Patient's Chart, lab work & pertinent test results  History of Anesthesia Complications Negative for: history of anesthetic complications  Airway Mallampati: II  TM Distance: >3 FB Neck ROM: Full    Dental no notable dental hx. (+) Teeth Intact   Pulmonary neg pulmonary ROS, neg sleep apnea, neg COPD, Patient abstained from smoking.Not current smoker, former smoker   Pulmonary exam normal breath sounds clear to auscultation       Cardiovascular Exercise Tolerance: Good METShypertension, (-) CAD and (-) Past MI negative cardio ROS Normal cardiovascular exam(-) dysrhythmias  Rhythm:Regular Rate:Normal - Systolic murmurs    Neuro/Psych negative neurological ROS  negative psych ROS   GI/Hepatic negative GI ROS, Neg liver ROS,GERD  Medicated,,(+)     (-) substance abuse    Endo/Other  negative endocrine ROSneg diabetes    Renal/GU negative Renal ROS  negative genitourinary   Musculoskeletal   Abdominal   Peds  Hematology negative hematology ROS (+)   Anesthesia Other Findings Past Medical History: No date: Adjustment insomnia No date: Allergy No date: Arthritis No date: Bacterial vaginitis No date: Breast cancer (HCC) 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 No date: Corns and callosities No date: GERD (gastroesophageal reflux disease) No date: History of kidney stones No date: Hyperlipidemia No date: Hypertension No date: Lymphadenopathy No date: 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) No date: Osteoporosis No date: Personal history of radiation therapy No date: Polyp of colon No date: Pre-diabetes No date: Renal calculi No date: Seasonal allergies  Past Surgical History: No date: ABDOMINAL HYSTERECTOMY     Comment:  partial No date:  ABDOMINAL HYSTERECTOMY W/ PARTIAL VAGINACTOMY; N/A 06/12/2020: BREAST BIOPSY; Left     Comment:  [place] Bx, Ribbon Clip, [facility] 07/04/2020: BREAST LUMPECTOMY; Left     Comment:  [facility] lumpectomy with radiation 07/10/2020: BREAST LUMPECTOMY,RADIO FREQ LOCALIZER,AXILLARY SENTINEL  LYMPH NODE BIOPSY; Left     Comment:  Procedure: BREAST LUMPECTOMY,RADIO FREQ               LOCALIZER,AXILLARY SENTINEL LYMPH NODE BIOPSY;  Surgeon:               Leafy Ro, MD;  Location: [facility] ORS;  Service:               General;  Laterality: Left; 10/18/2015: COLONOSCOPY WITH PROPOFOL; N/A     Comment:  Procedure: COLONOSCOPY WITH PROPOFOL;  Surgeon: Scot Jun, MD;  Location: [facility] ENDOSCOPY;  Service:               Endoscopy;  Laterality: N/A; 08/19/2021: COLONOSCOPY WITH PROPOFOL; N/A     Comment:  Procedure: COLONOSCOPY WITH PROPOFOL;  Surgeon:               Regis Bill, MD;  Location: [facility] ENDOSCOPY;                Service: Endoscopy;  Laterality: N/A; No date: INGUINAL HERNIA REPAIR     Comment:  age 23's No date: POLYPECTOMY  BMI    Body Mass Index: 23.17 kg/m      Reproductive/Obstetrics negative OB ROS  Anesthesia Physical Anesthesia Plan  ASA: 2  Anesthesia Plan: General   Post-op Pain Management:    Induction: Intravenous  PONV Risk Score and Plan: 3 and Ondansetron, Propofol infusion and TIVA  Airway Management Planned: Nasal Cannula  Additional Equipment: None  Intra-op Plan:   Post-operative Plan:   Informed Consent: I have reviewed the patients History and Physical, chart, labs and discussed the procedure including the risks, benefits and alternatives for the proposed anesthesia with the patient or authorized representative who has indicated his/her understanding and acceptance.     Dental advisory given  Plan Discussed with: CRNA and Surgeon  Anesthesia Plan Comments: (Discussed risks of  anesthesia with patient, including possibility of difficulty with spontaneous ventilation under anesthesia necessitating airway intervention, PONV, and rare risks such as cardiac or respiratory or neurological events, and allergic reactions. Patient understands.)        Anesthesia Quick Evaluation

## 2024-03-07 NOTE — H&P (Signed)
 Outpatient short stay form Pre-procedure 03/07/2024  Regis Bill, MD  Primary Physician: Sallyanne Kuster, NP  Reason for visit:  Abnormal imaging  History of present illness:    78 y/o lady with history of hypertension, hyperlipidemia, and breast cancer here for EGD for abnormal barium swallow. Notes since starting PPI her dysphagia has improved. No blood thinners. No neck surgeries. No family history of esophageal cancer.    Current Facility-Administered Medications:    0.9 %  sodium chloride infusion, , Intravenous, Continuous, Ayra Hodgdon, Rossie Muskrat, MD, Last Rate: 20 mL/hr at 03/07/24 0802, Continued from Pre-op at 03/07/24 0802  Medications Prior to Admission  Medication Sig Dispense Refill Last Dose/Taking   amLODipine (NORVASC) 5 MG tablet Take 5 mg by mouth daily.   03/07/2024 Morning   busPIRone (BUSPAR) 5 MG tablet TAKE 1 TABLET BY MOUTH ONCE DAILY 30 tablet 2 03/06/2024   diltiazem (CARDIZEM CD) 120 MG 24 hr capsule Take 1 capsule (120 mg total) by mouth daily. 90 capsule 3 03/07/2024 Morning   fluticasone (FLONASE) 50 MCG/ACT nasal spray SPRAY TWICE INTO EACH NOSTRIL ONCE DAILY 16 g 3 03/06/2024   gabapentin (NEURONTIN) 100 MG capsule TAKE ONE CAPSULE BY MOUTH EVERY MORNING,ONE CAPSULE IN THE AFTERNOON, AND 3 CAPSULES AT BEDTIME. 150 capsule 3 Past Week   latanoprost (XALATAN) 0.005 % ophthalmic solution 1 drop at bedtime.   Past Week   letrozole (FEMARA) 2.5 MG tablet TAKE 1 TABLET BY MOUTH DAILY 30 tablet 2 03/06/2024   lisinopril (ZESTRIL) 20 MG tablet TAKE ONE TABLET BY MOUTH EVERY MORNING AND ONE IN EVENING 180 tablet 3 03/07/2024 Morning   omeprazole (PRILOSEC) 20 MG capsule TAKE 1 CAPSULE BY MOUTH DAILY. 30 capsule 2 03/06/2024   rosuvastatin (CRESTOR) 5 MG tablet Take 1 tablet (5 mg total) by mouth daily. 90 tablet 1 03/06/2024   Vitamin D, Ergocalciferol, 50000 units CAPS TAKE 1 CAPSULE BY MOUTH ONCE A WEEK 4 capsule 5 Past Week   acetaminophen (TYLENOL) 500 MG tablet Take  500 mg by mouth daily as needed for moderate pain.       alendronate (FOSAMAX) 70 MG tablet TAKE 1 TABLET EVERY 7 DAYS WITH A FULL GLASS OF WATER ON AN EMPTY STOMACH DO NOT LIE DOWN FOR AT LEAST 30 MIN 4 tablet 3 03/03/2024   aspirin EC 81 MG tablet Take 81 mg by mouth every other day.   03/04/2024   Multiple Vitamin (MULTIVITAMIN WITH MINERALS) TABS tablet Take 1 tablet by mouth daily.         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    Physical Exam  Gen:  Alert, oriented. Appears stated age.  HEENT: PERRLA. Lungs: No respiratory distress CV: RRR Abd: soft, benign, no masses Ext: No edema    Planned procedures: Proceed with EGD. The patient understands the nature of the planned procedure, indications, risks, alternatives and potential complications including but not limited to bleeding, infection, perforation, damage to internal organs and possible oversedation/side effects from anesthesia. The patient agrees and gives consent to proceed.  Please refer to procedure notes for findings, recommendations and patient disposition/instructions.     Regis Bill, MD [facility]

## 2024-03-07 NOTE — Transfer of Care (Signed)
 Immediate Anesthesia Transfer of Care Note  Patient: Virginia Warner  Procedure(s) Performed: ESOPHAGOGASTRODUODENOSCOPY (EGD) WITH PROPOFOL  Patient Location: PACU  Anesthesia Type:General  Level of Consciousness: awake and sedated  Airway & Oxygen Therapy: Patient Spontanous Breathing  Post-op Assessment: Report given to RN  Post vital signs: Reviewed and stable  Last Vitals:  Vitals Value Taken Time  BP    Temp    Pulse    Resp    SpO2      Last Pain:  Vitals:   03/07/24 0743  TempSrc: Temporal         Complications: There were no known notable events for this encounter.

## 2024-03-07 NOTE — Telephone Encounter (Signed)
 Pt will need to be schedule to see Sam today for a UA and KUB prior.  LVM for pt to return call to schedule appt.

## 2024-03-07 NOTE — Telephone Encounter (Signed)
 PT came in and wanted to let [facility] Know that she noticed some blood in her urine and was concerned that she was passing a kidney stone.

## 2024-03-07 NOTE — Anesthesia Postprocedure Evaluation (Signed)
 Anesthesia Post Note  Patient: Virginia Warner  Procedure(s) Performed: ESOPHAGOGASTRODUODENOSCOPY (EGD) WITH PROPOFOL  Patient location during evaluation: Endoscopy Anesthesia Type: General Level of consciousness: awake and alert Pain management: pain level controlled Vital Signs Assessment: post-procedure vital signs reviewed and stable Respiratory status: spontaneous breathing, nonlabored ventilation, respiratory function stable and patient connected to nasal cannula oxygen Cardiovascular status: blood pressure returned to baseline and stable Postop Assessment: no apparent nausea or vomiting Anesthetic complications: no  There were no known notable events for this encounter.   Last Vitals:  Vitals:   03/07/24 0855 03/07/24 0905  BP: (!) 150/59 129/81  Pulse: 73 70  Resp: 17 14  Temp:    SpO2: 98% 98%    Last Pain:  Vitals:   03/07/24 0905  TempSrc:   PainSc: 0-No pain                 Stephanie Coup

## 2024-03-07 NOTE — Interval H&P Note (Signed)
 History and Physical Interval Note:  03/07/2024 8:26 AM  Virginia Warner  has presented today for surgery, with the diagnosis of Abnormal finding on imaging.  The various methods of treatment have been discussed with the patient and family. After consideration of risks, benefits and other options for treatment, the patient has consented to  Procedure(s): ESOPHAGOGASTRODUODENOSCOPY (EGD) WITH PROPOFOL (N/A) as a surgical intervention.  The patient's history has been reviewed, patient examined, no change in status, stable for surgery.  I have reviewed the patient's chart and labs.  Questions were answered to the patient's satisfaction.     Regis Bill  Ok to proceed with EGD

## 2024-03-09 ENCOUNTER — Other Ambulatory Visit: Payer: Self-pay | Admitting: Nurse Practitioner

## 2024-03-10 ENCOUNTER — Other Ambulatory Visit: Payer: Self-pay | Admitting: Nurse Practitioner

## 2024-03-10 DIAGNOSIS — F411 Generalized anxiety disorder: Secondary | ICD-10-CM

## 2024-03-11 ENCOUNTER — Encounter: Payer: Self-pay | Admitting: Physician Assistant

## 2024-03-11 ENCOUNTER — Ambulatory Visit (INDEPENDENT_AMBULATORY_CARE_PROVIDER_SITE_OTHER): Admitting: Physician Assistant

## 2024-03-11 ENCOUNTER — Other Ambulatory Visit: Payer: Self-pay | Admitting: Physician Assistant

## 2024-03-11 ENCOUNTER — Ambulatory Visit
Admission: RE | Admit: 2024-03-11 | Discharge: 2024-03-11 | Disposition: A | Discharge status: Home / Self Care | Source: Ambulatory Visit | Attending: Physician Assistant | Admitting: Physician Assistant

## 2024-03-11 ENCOUNTER — Ambulatory Visit
Admission: RE | Admit: 2024-03-11 | Discharge: 2024-03-11 | Disposition: A | Discharge status: Home / Self Care | Attending: Physician Assistant | Admitting: Physician Assistant

## 2024-03-11 VITALS — BP 153/69 | HR 80 | Ht 64.0 in | Wt 131.8 lb

## 2024-03-11 DIAGNOSIS — R310 Gross hematuria: Secondary | ICD-10-CM | POA: Insufficient documentation

## 2024-03-11 DIAGNOSIS — N2889 Other specified disorders of kidney and ureter: Secondary | ICD-10-CM | POA: Diagnosis not present

## 2024-03-11 DIAGNOSIS — N200 Calculus of kidney: Secondary | ICD-10-CM | POA: Diagnosis not present

## 2024-03-11 LAB — MICROSCOPIC EXAMINATION: Bacteria, UA: NONE SEEN

## 2024-03-11 LAB — URINALYSIS, COMPLETE
Bilirubin, UA: NEGATIVE
Glucose, UA: NEGATIVE
Ketones, UA: NEGATIVE
Leukocytes,UA: NEGATIVE
Nitrite, UA: NEGATIVE
Protein,UA: NEGATIVE
Specific Gravity, UA: 1.01 (ref 1.005–1.030)
Urobilinogen, Ur: 0.2 mg/dL (ref 0.2–1.0)
pH, UA: 7 (ref 5.0–7.5)

## 2024-03-11 NOTE — Progress Notes (Signed)
 03/11/2024 1:41 PM   Virginia Warner 1946/10/23 [id number]  CC: Chief Complaint  Patient presents with   Hematuria   HPI: Virginia Warner is a 78 y.o. female with PMH gross hematuria with benign workup in 2022 and nonobstructing left renal stones who presents today for evaluation of gross hematuria.   Today she reports gross hematuria that occurred 1 week ago in the setting of heavy lifting and housework.  She had some associated left low back pain.  She is rather asymptomatic today.  KUB today with a possible small left UPJ stone as well as a new radiopaque density in the left hemipelvis, question bowel contents.  In-office UA today positive for trace lysed blood; urine microscopy pan negative.  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)    Osteoporosis    Personal history of radiation therapy    Polyp of colon    Pre-diabetes    Renal calculi    Seasonal allergies     Surgical History: Past Surgical History:  Procedure Laterality Date   ABDOMINAL HYSTERECTOMY     partial   ABDOMINAL HYSTERECTOMY W/ PARTIAL VAGINACTOMY N/A    BREAST BIOPSY Left 06/12/2020   [place] Bx, Ribbon Clip, [facility]   BREAST LUMPECTOMY Left 07/04/2020   [facility] lumpectomy with radiation   BREAST LUMPECTOMY,RADIO FREQ LOCALIZER,AXILLARY SENTINEL LYMPH NODE BIOPSY Left 07/10/2020   Procedure: BREAST LUMPECTOMY,RADIO FREQ LOCALIZER,AXILLARY SENTINEL LYMPH NODE BIOPSY;  Surgeon: Leafy Ro, MD;  Location: [facility] ORS;  Service: General;  Laterality: Left;   COLONOSCOPY WITH PROPOFOL N/A 10/18/2015   Procedure: COLONOSCOPY WITH PROPOFOL;  Surgeon: Scot Jun, MD;  Location: [facility] ENDOSCOPY;  Service: Endoscopy;  Laterality: N/A;   COLONOSCOPY WITH PROPOFOL N/A 08/19/2021   Procedure: COLONOSCOPY WITH PROPOFOL;  Surgeon: Regis Bill, MD;  Location: [facility] ENDOSCOPY;  Service: Endoscopy;  Laterality: N/A;   ESOPHAGOGASTRODUODENOSCOPY (EGD) WITH PROPOFOL N/A 03/07/2024   Procedure: ESOPHAGOGASTRODUODENOSCOPY (EGD) WITH PROPOFOL;  Surgeon: Regis Bill, MD;  Location: [facility] ENDOSCOPY;  Service: Endoscopy;  Laterality: N/A;   INGUINAL HERNIA REPAIR     age 72's   POLYPECTOMY      Home Medications:  Allergies as of 03/11/2024       Reactions   Aspirin Nausea And Vomiting   Cannot tolerate unless enteric coated   Atorvastatin Other (See Comments)   Bone pain        Medication List        Accurate as of March 11, 2024  1:41 PM. If you have any questions, ask your nurse or doctor.          acetaminophen 500 MG tablet Commonly known as: TYLENOL Take 500 mg by mouth daily as needed for moderate pain.   alendronate 70 MG tablet Commonly known as: FOSAMAX TAKE 1 TABLET EVERY 7 DAYS WITH A FULL GLASS OF WATER ON AN EMPTY STOMACH DO NOT LIE DOWN FOR AT LEAST 30 MIN   amLODipine 5 MG tablet Commonly known as: NORVASC Take 5 mg by mouth daily.   aspirin  EC 81 MG tablet Take 81 mg by mouth every other day.   busPIRone 5 MG tablet Commonly known as: BUSPAR TAKE 1 TABLET BY MOUTH ONCE DAILY   diltiazem 120 MG 24 hr capsule Commonly known as: CARDIZEM CD Take 1 capsule (120 mg total) by mouth daily.   fluticasone 50 MCG/ACT nasal spray Commonly known as: FLONASE SPRAY TWICE INTO EACH NOSTRIL ONCE DAILY   gabapentin 100 MG capsule Commonly known as: NEURONTIN TAKE ONE CAPSULE BY MOUTH EVERY MORNING,ONE CAPSULE IN THE AFTERNOON, AND 3 CAPSULES AT BEDTIME.   latanoprost 0.005 % ophthalmic solution Commonly known as: XALATAN 1 drop at bedtime.   letrozole 2.5 MG tablet Commonly known as: FEMARA TAKE 1 TABLET  BY MOUTH DAILY   lisinopril 20 MG tablet Commonly known as: ZESTRIL TAKE ONE TABLET BY MOUTH EVERY MORNING AND ONE IN EVENING   multivitamin with minerals Tabs tablet Take 1 tablet by mouth daily.   omeprazole 20 MG capsule Commonly known as: PRILOSEC TAKE 1 CAPSULE BY MOUTH DAILY.   rosuvastatin 5 MG tablet Commonly known as: CRESTOR Take 1 tablet (5 mg total) by mouth daily.   Vitamin D (Ergocalciferol) 50000 units Caps TAKE 1 CAPSULE BY MOUTH ONCE A WEEK        Allergies:  Allergies  Allergen Reactions   Aspirin Nausea And Vomiting    Cannot tolerate unless enteric coated   Atorvastatin Other (See Comments)    Bone pain    Family History: Family History  Problem Relation Age of Onset   Cancer Brother    Breast cancer Paternal Aunt     Social History:   reports that she quit smoking about 16 years ago. Her smoking use included cigarettes. She has been exposed to tobacco smoke. She has never used smokeless tobacco. She reports that she does not currently use alcohol. She reports that she does not use drugs.  Physical Exam: BP (!) 153/69   Pulse 80   Ht 5\' 4"  (1.626 m)   Wt 131 lb 12.8 oz (59.8 kg)   BMI 22.62 kg/m   Constitutional:  Alert and oriented, no acute distress, nontoxic appearing HEENT: [place], AT Cardiovascular: No clubbing, cyanosis, or edema Respiratory: Normal respiratory effort, no increased work of breathing Skin: No rashes, bruises or suspicious lesions Neurologic: Grossly intact, no focal deficits, moving all 4 extremities Psychiatric: Normal mood and affect  Laboratory Data: Results for orders placed or performed in visit on 03/11/24  Microscopic Examination   Collection Time: 03/11/24  1:04 PM   Urine  Result Value Ref Range   WBC, UA 0-5 0 - 5 /hpf   RBC, Urine 0-2 0 - 2 /hpf   Epithelial Cells (non renal) 0-10 0 - 10 /hpf   Bacteria, UA None seen None seen/Few  Urinalysis, Complete   Collection Time: 03/11/24  1:04 PM  Result  Value Ref Range   Specific Gravity, UA 1.010 1.005 - 1.030   pH, UA 7.0 5.0 - 7.5   Color, UA Yellow Yellow   Appearance Ur Clear Clear   Leukocytes,UA Negative Negative   Protein,UA Negative Negative/Trace   Glucose, UA Negative Negative   Ketones, UA Negative Negative   RBC, UA Trace (A) Negative   Bilirubin, UA Negative Negative   Urobilinogen, Ur 0.2 0.2 - 1.0 mg/dL   Nitrite, UA Negative Negative   Microscopic Examination See below:     Pertinent Imaging: KUB, 03/11/2024: See epic  I personally reviewed the images referenced above and note  stable left lower pole stones, a new small possible left UPJ stone, and a radiopaque density in the left hemipelvis of unclear etiology..  Assessment & Plan:   1. Gross hematuria (Primary) UA is bland and she is rather asymptomatic today, however I do see a possible left UPJ stone on KUB.  With an unclear radiopaque density in the left hemipelvis, I recommended pursuing CT stone study for further evaluation and she agreed.  I will see her back in clinic in about 2 weeks with a CT prior. - Urinalysis, Complete - CT RENAL STONE STUDY; Future   Return in about 2 weeks (around 03/25/2024) for CT prior and UA.  Carman Ching, PA-C  [facility] [place] [address], [address] [place], [place] [address] [phone]

## 2024-03-14 ENCOUNTER — Ambulatory Visit: Payer: 59 | Admitting: Nurse Practitioner

## 2024-03-14 ENCOUNTER — Encounter: Payer: Self-pay | Admitting: Nurse Practitioner

## 2024-03-14 VITALS — BP 126/79 | HR 86 | Temp 97.6°F | Resp 16 | Ht 64.0 in | Wt 134.6 lb

## 2024-03-14 DIAGNOSIS — K219 Gastro-esophageal reflux disease without esophagitis: Secondary | ICD-10-CM | POA: Diagnosis not present

## 2024-03-14 DIAGNOSIS — I700 Atherosclerosis of aorta: Secondary | ICD-10-CM | POA: Diagnosis not present

## 2024-03-14 DIAGNOSIS — K222 Esophageal obstruction: Secondary | ICD-10-CM | POA: Diagnosis not present

## 2024-03-14 DIAGNOSIS — I10 Essential (primary) hypertension: Secondary | ICD-10-CM | POA: Diagnosis not present

## 2024-03-14 DIAGNOSIS — F411 Generalized anxiety disorder: Secondary | ICD-10-CM

## 2024-03-14 MED ORDER — ROSUVASTATIN CALCIUM 5 MG PO TABS: 5.0000 mg | ORAL_TABLET | Freq: Every day | ORAL | 1 refills | Status: DC

## 2024-03-14 MED ORDER — OMEPRAZOLE 20 MG PO CPDR: 20.0000 mg | DELAYED_RELEASE_CAPSULE | Freq: Every day | ORAL | 2 refills | Status: DC

## 2024-03-14 NOTE — Progress Notes (Signed)
 [facility] [address] [place], [place] [address]  Internal MEDICINE  Office Visit Note  Patient Name: Virginia Warner  [id number]  [id number]  Date of Service: 03/14/2024  Chief Complaint  Patient presents with   Gastroesophageal Reflux   Hypertension   Hyperlipidemia   Follow-up    HPI Tijah presents for a follow-up visit for hypertension, high cholesterol, anxiety, esophageal stricture and recent gross hematuria Recently seen by urology for blood in the urine, abnormal KUB xray, urology ordered CT scan for further evaluation Seeing GI, getting subsequent dilation for the esophageal stricture.  Blood pressure is stable High cholesterol -- taking rosuvastatin Anxiety -- doing well with buspirone once daily.     Current Medication: Outpatient Encounter Medications as of 03/14/2024  Medication Sig   acetaminophen (TYLENOL) 500 MG tablet Take 500 mg by mouth daily as needed for moderate pain.    alendronate (FOSAMAX) 70 MG tablet TAKE 1 TABLET EVERY 7 DAYS WITH A FULL GLASS OF WATER ON AN EMPTY STOMACH DO NOT LIE DOWN FOR AT LEAST 30 MIN   amLODipine (NORVASC) 5 MG tablet Take 5 mg by mouth daily.   aspirin EC 81 MG tablet Take 81 mg by mouth every other day.   busPIRone (BUSPAR) 5 MG tablet TAKE 1 TABLET BY MOUTH ONCE DAILY   diltiazem (CARDIZEM CD) 120 MG 24 hr capsule Take 1 capsule (120 mg total) by mouth daily.   fluticasone (FLONASE) 50 MCG/ACT nasal spray SPRAY TWICE INTO EACH NOSTRIL ONCE DAILY   gabapentin (NEURONTIN) 100 MG capsule TAKE ONE CAPSULE BY MOUTH EVERY MORNING,ONE CAPSULE IN THE AFTERNOON, AND 3 CAPSULES AT BEDTIME.   latanoprost (XALATAN) 0.005 % ophthalmic solution 1 drop at bedtime.   letrozole (FEMARA) 2.5 MG tablet TAKE 1 TABLET BY MOUTH DAILY   lisinopril (ZESTRIL) 20 MG tablet TAKE ONE TABLET BY MOUTH EVERY MORNING AND ONE IN EVENING   Multiple Vitamin (MULTIVITAMIN WITH MINERALS) TABS tablet Take 1 tablet by mouth daily.    Vitamin D,  Ergocalciferol, 50000 units CAPS TAKE 1 CAPSULE BY MOUTH ONCE A WEEK   [DISCONTINUED] omeprazole (PRILOSEC) 20 MG capsule TAKE 1 CAPSULE BY MOUTH DAILY.   [DISCONTINUED] rosuvastatin (CRESTOR) 5 MG tablet Take 1 tablet (5 mg total) by mouth daily.   omeprazole (PRILOSEC) 20 MG capsule Take 1 capsule (20 mg total) by mouth daily.   rosuvastatin (CRESTOR) 5 MG tablet Take 1 tablet (5 mg total) by mouth daily.   No facility-administered encounter medications on file as of 03/14/2024.    Surgical History: Past Surgical History:  Procedure Laterality Date   ABDOMINAL HYSTERECTOMY     partial   ABDOMINAL HYSTERECTOMY W/ PARTIAL VAGINACTOMY N/A    BREAST BIOPSY Left 06/12/2020   [place] Bx, Ribbon Clip, [facility]   BREAST LUMPECTOMY Left 07/04/2020   [facility] lumpectomy with radiation   BREAST LUMPECTOMY,RADIO FREQ LOCALIZER,AXILLARY SENTINEL LYMPH NODE BIOPSY Left 07/10/2020   Procedure: BREAST LUMPECTOMY,RADIO FREQ LOCALIZER,AXILLARY SENTINEL LYMPH NODE BIOPSY;  Surgeon: Leafy Ro, MD;  Location: [facility] ORS;  Service: General;  Laterality: Left;   COLONOSCOPY WITH PROPOFOL N/A 10/18/2015   Procedure: COLONOSCOPY WITH PROPOFOL;  Surgeon: Scot Jun, MD;  Location: [facility] ENDOSCOPY;  Service: Endoscopy;  Laterality: N/A;   COLONOSCOPY WITH PROPOFOL N/A 08/19/2021   Procedure: COLONOSCOPY WITH PROPOFOL;  Surgeon: Regis Bill, MD;  Location: [facility] ENDOSCOPY;  Service: Endoscopy;  Laterality: N/A;   ESOPHAGOGASTRODUODENOSCOPY (EGD) WITH PROPOFOL N/A 03/07/2024   Procedure: ESOPHAGOGASTRODUODENOSCOPY (EGD) WITH  PROPOFOL;  Surgeon: Regis Bill, MD;  Location: [facility] ENDOSCOPY;  Service: Endoscopy;  Laterality: N/A;   INGUINAL HERNIA REPAIR     age 20's   POLYPECTOMY      Medical History: Past Medical History:  Diagnosis Date   Adjustment insomnia    Allergy    Arthritis    Bacterial vaginitis    Breast cancer (HCC)    Congenital deformities of feet    Corns and callosities    GERD  (gastroesophageal reflux disease)    History of kidney stones    Hyperlipidemia    Hypertension    Lymphadenopathy    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)    Osteoporosis    Personal history of radiation therapy    Polyp of colon    Pre-diabetes    Renal calculi    Seasonal allergies     Family History: Family History  Problem Relation Age of Onset   Cancer Brother    Breast cancer Paternal Aunt     Social History   Socioeconomic History   Marital status: Divorced    Spouse name: Not on file   Number of children: Not on file   Years of education: Not on file   Highest education level: Not on file  Occupational History   Not on file  Tobacco Use   Smoking status: Former    Current packs/day: 0.00    Types: Cigarettes    Quit date: 11/2007    Years since quitting: 16.3    Passive exposure: Past   Smokeless tobacco: Never  Vaping Use   Vaping status: Never Used  Substance and Sexual Activity   Alcohol use: Not Currently    Alcohol/week: 0.0 standard drinks of alcohol   Drug use: No   Sexual activity: Not Currently  Other Topics Concern   Not on file  Social History Narrative   Not on file   Social Drivers of Health   Financial Resource Strain: Not on file  Food Insecurity: Not on file  Transportation Needs: Not on file  Physical Activity: Not on file  Stress: Not on file  Social Connections: Not on file  Intimate Partner Violence: Not on file      Review of Systems  Constitutional:  Negative for appetite change, fatigue and fever.  HENT:  Negative for congestion, mouth sores and postnasal drip.   Respiratory: Negative.  Negative for cough, chest tightness and shortness of breath.   Cardiovascular: Negative.  Negative for chest pain and palpitations.  Gastrointestinal: Negative.   Genitourinary: Negative.  Negative for flank pain.  Musculoskeletal:  Positive for arthralgias.  Neurological: Negative.  Negative for  headaches.  Psychiatric/Behavioral:  Negative for behavioral problems, self-injury, sleep disturbance and suicidal ideas. The patient is nervous/anxious.     Vital Signs: BP 126/79   Pulse 86   Temp 97.6 F (36.4 C)   Resp 16   Ht 5\' 4"  (1.626 m)   Wt 134 lb 9.6 oz (61.1 kg)   SpO2 97%   BMI 23.10 kg/m    Physical Exam Vitals reviewed.  Constitutional:      General: She is not in acute distress.    Appearance: Normal appearance. She is normal weight. She is not ill-appearing.  HENT:     Head: Normocephalic and atraumatic.  Eyes:     Pupils: Pupils are equal, round, and reactive to light.  Cardiovascular:     Rate and Rhythm: Normal rate and  regular rhythm.  Pulmonary:     Effort: Pulmonary effort is normal. No respiratory distress.  Neurological:     Mental Status: She is alert and oriented to person, place, and time.  Psychiatric:        Mood and Affect: Mood normal.        Behavior: Behavior normal.        Assessment/Plan: 1. Essential hypertension (Primary) Stable, continue medications as prescribed.   2. Aortic atherosclerosis (HCC) Continue rosuvastatin as prescribed  - rosuvastatin (CRESTOR) 5 MG tablet; Take 1 tablet (5 mg total) by mouth daily.  Dispense: 90 tablet; Refill: 1  3. Esophageal stricture Scheduled for another esophageal dilation with GI  4. Gastroesophageal reflux disease without esophagitis Continue omeprazole as prescribed  - omeprazole (PRILOSEC) 20 MG capsule; Take 1 capsule (20 mg total) by mouth daily.  Dispense: 30 capsule; Refill: 2  5. GAD (generalized anxiety disorder) Continue buspirone daily as prescribed    General Counseling: Letitia Neri understanding of the findings of todays visit and agrees with plan of treatment. I have discussed any further diagnostic evaluation that may be needed or ordered today. We also reviewed her medications today. she has been encouraged to call the office with any questions or concerns  that should arise related to todays visit.    No orders of the defined types were placed in this encounter.   Meds ordered this encounter  Medications   rosuvastatin (CRESTOR) 5 MG tablet    Sig: Take 1 tablet (5 mg total) by mouth daily.    Dispense:  90 tablet    Refill:  1   omeprazole (PRILOSEC) 20 MG capsule    Sig: Take 1 capsule (20 mg total) by mouth daily.    Dispense:  30 capsule    Refill:  2    FOR NEXT FILL. THANKS.    Return for previously scheduled, AWV, Fain Francis PCP in june.   Total time spent:30 Minutes Time spent includes review of chart, medications, test results, and follow up plan with the patient.   [place] Controlled Substance Database was reviewed by me.  This patient was seen by Sallyanne Kuster, FNP-C in collaboration with Dr. Beverely Risen as a part of collaborative care agreement.   Marvelene Stoneberg R. Tedd Sias, MSN, FNP-C Internal medicine

## 2024-03-15 ENCOUNTER — Other Ambulatory Visit: Payer: Self-pay | Admitting: Physician Assistant

## 2024-03-15 ENCOUNTER — Other Ambulatory Visit

## 2024-03-15 DIAGNOSIS — N200 Calculus of kidney: Secondary | ICD-10-CM | POA: Diagnosis not present

## 2024-03-19 ENCOUNTER — Encounter: Payer: Self-pay | Admitting: Nurse Practitioner

## 2024-03-21 ENCOUNTER — Ambulatory Visit (INDEPENDENT_AMBULATORY_CARE_PROVIDER_SITE_OTHER): Admitting: Physician Assistant

## 2024-03-21 VITALS — BP 142/81 | HR 93 | Ht 64.0 in | Wt 132.0 lb

## 2024-03-21 DIAGNOSIS — N200 Calculus of kidney: Secondary | ICD-10-CM | POA: Diagnosis not present

## 2024-03-21 DIAGNOSIS — R310 Gross hematuria: Secondary | ICD-10-CM

## 2024-03-21 LAB — CALCULI, WITH PHOTOGRAPH (CLINICAL LAB)
Calcium Oxalate Dihydrate: 95 %
Hydroxyapatite: 5 %
Weight Calculi: 6 mg

## 2024-03-21 LAB — URINALYSIS, COMPLETE
Bilirubin, UA: NEGATIVE
Glucose, UA: NEGATIVE
Ketones, UA: NEGATIVE
Leukocytes,UA: NEGATIVE
Nitrite, UA: NEGATIVE
Protein,UA: NEGATIVE
Specific Gravity, UA: 1.01 (ref 1.005–1.030)
Urobilinogen, Ur: 0.2 mg/dL (ref 0.2–1.0)
pH, UA: 5.5 (ref 5.0–7.5)

## 2024-03-21 LAB — MICROSCOPIC EXAMINATION

## 2024-03-21 NOTE — Progress Notes (Unsigned)
 03/21/2024 11:44 AM   [name] 1946-11-01 [id number]  CC: Chief Complaint  Patient presents with   Hematuria   HPI: Virginia Warner is a 78 y.o. female with PMH gross hematuria with benign workup in 2022 and nonobstructing left renal stones with a recent history of gross hematuria associated with heavy lifting and possible acute stone episode (4mm) who presents today for follow-up.   Since I last saw her in clinic, she spontaneously passed a punctate stone.  She dropped it off for analysis last week, but the results are still pending.  Today she reports her pain has resolved.  She is very concerned about the radiopaque density in the left hemipelvis seen on her prior KUB.  In-office UA today positive for trace lysed blood; urine microscopy with 6-10 WBCs/HPF and 3-10 RBCs/HPF.  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)    Osteoporosis    Personal history of radiation therapy    Polyp of colon    Pre-diabetes    Renal calculi    Seasonal allergies     Surgical History: Past Surgical History:  Procedure Laterality Date   ABDOMINAL HYSTERECTOMY     partial   ABDOMINAL HYSTERECTOMY W/ PARTIAL VAGINACTOMY N/A    BREAST BIOPSY Left 06/12/2020   [place] Bx, Ribbon Clip, [facility]   BREAST LUMPECTOMY Left 07/04/2020   [facility] lumpectomy with radiation   BREAST LUMPECTOMY,RADIO FREQ LOCALIZER,AXILLARY SENTINEL LYMPH NODE BIOPSY Left 07/10/2020   Procedure: BREAST LUMPECTOMY,RADIO FREQ LOCALIZER,AXILLARY SENTINEL LYMPH NODE BIOPSY;  Surgeon: Leafy Ro, MD;  Location: [facility] ORS;  Service: General;  Laterality: Left;   COLONOSCOPY  WITH PROPOFOL N/A 10/18/2015   Procedure: COLONOSCOPY WITH PROPOFOL;  Surgeon: Scot Jun, MD;  Location: [facility] ENDOSCOPY;  Service: Endoscopy;  Laterality: N/A;   COLONOSCOPY WITH PROPOFOL N/A 08/19/2021   Procedure: COLONOSCOPY WITH PROPOFOL;  Surgeon: Regis Bill, MD;  Location: [facility] ENDOSCOPY;  Service: Endoscopy;  Laterality: N/A;   ESOPHAGOGASTRODUODENOSCOPY (EGD) WITH PROPOFOL N/A 03/07/2024   Procedure: ESOPHAGOGASTRODUODENOSCOPY (EGD) WITH PROPOFOL;  Surgeon: Regis Bill, MD;  Location: [facility] ENDOSCOPY;  Service: Endoscopy;  Laterality: N/A;   INGUINAL HERNIA REPAIR     age 82's   POLYPECTOMY      Home Medications:  Allergies as of 03/21/2024       Reactions   Aspirin Nausea And Vomiting   Cannot tolerate unless enteric coated   Atorvastatin Other (See Comments)   Bone pain        Medication List        Accurate as of March 21, 2024 11:44 AM. If you have any questions, ask your nurse or doctor.          acetaminophen 500 MG tablet Commonly known as: TYLENOL Take 500 mg by mouth daily as needed for moderate pain.   alendronate 70 MG tablet Commonly known as: FOSAMAX TAKE 1 TABLET EVERY 7 DAYS WITH A FULL GLASS OF WATER ON AN EMPTY STOMACH DO NOT LIE DOWN FOR AT LEAST 30 MIN   amLODipine 5 MG  tablet Commonly known as: NORVASC Take 5 mg by mouth daily.   aspirin EC 81 MG tablet Take 81 mg by mouth every other day.   busPIRone 5 MG tablet Commonly known as: BUSPAR TAKE 1 TABLET BY MOUTH ONCE DAILY   diltiazem 120 MG 24 hr capsule Commonly known as: CARDIZEM CD Take 1 capsule (120 mg total) by mouth daily.   fluticasone 50 MCG/ACT nasal spray Commonly known as: FLONASE SPRAY TWICE INTO EACH NOSTRIL ONCE DAILY What changed: See the new instructions.   gabapentin 100 MG capsule Commonly known as: NEURONTIN TAKE ONE CAPSULE BY MOUTH EVERY MORNING,ONE CAPSULE IN THE AFTERNOON, AND 3 CAPSULES AT BEDTIME.   latanoprost 0.005 %  ophthalmic solution Commonly known as: XALATAN 1 drop at bedtime.   letrozole 2.5 MG tablet Commonly known as: FEMARA TAKE 1 TABLET BY MOUTH DAILY   lisinopril 20 MG tablet Commonly known as: ZESTRIL TAKE ONE TABLET BY MOUTH EVERY MORNING AND ONE IN EVENING   multivitamin with minerals Tabs tablet Take 1 tablet by mouth daily.   omeprazole 20 MG capsule Commonly known as: PRILOSEC Take 1 capsule (20 mg total) by mouth daily. What changed:  when to take this reasons to take this   rosuvastatin 5 MG tablet Commonly known as: CRESTOR Take 1 tablet (5 mg total) by mouth daily.   Vitamin D (Ergocalciferol) 50000 units Caps TAKE 1 CAPSULE BY MOUTH ONCE A WEEK        Allergies:  Allergies  Allergen Reactions   Aspirin Nausea And Vomiting    Cannot tolerate unless enteric coated   Atorvastatin Other (See Comments)    Bone pain    Family History: Family History  Problem Relation Age of Onset   Cancer Brother    Breast cancer Paternal Aunt     Social History:   reports that she quit smoking about 16 years ago. Her smoking use included cigarettes. She has been exposed to tobacco smoke. She has never used smokeless tobacco. She reports that she does not currently use alcohol. She reports that she does not use drugs.  Physical Exam: BP (!) 142/81   Pulse 93   Ht 5\' 4"  (1.626 m)   Wt 132 lb (59.9 kg)   BMI 22.66 kg/m   Constitutional:  Alert and oriented, no acute distress, nontoxic appearing HEENT: [place], AT Cardiovascular: No clubbing, cyanosis, or edema Respiratory: Normal respiratory effort, no increased work of breathing Skin: No rashes, bruises or suspicious lesions Neurologic: Grossly intact, no focal deficits, moving all 4 extremities Psychiatric: Normal mood and affect  Laboratory Data: Results for orders placed or performed in visit on 03/21/24  Microscopic Examination   Collection Time: 03/21/24 11:19 AM   Urine  Result Value Ref Range   WBC, UA 6-10  (A) 0 - 5 /hpf   RBC, Urine 3-10 (A) 0 - 2 /hpf   Epithelial Cells (non renal) 0-10 0 - 10 /hpf   Mucus, UA Present (A) Not Estab.   Bacteria, UA Few None seen/Few  Urinalysis, Complete   Collection Time: 03/21/24 11:19 AM  Result Value Ref Range   Specific Gravity, UA 1.010 1.005 - 1.030   pH, UA 5.5 5.0 - 7.5   Color, UA Yellow Yellow   Appearance Ur Clear Clear   Leukocytes,UA Negative Negative   Protein,UA Negative Negative/Trace   Glucose, UA Negative Negative   Ketones, UA Negative Negative   RBC, UA Trace (A) Negative   Bilirubin, UA Negative Negative  Urobilinogen, Ur 0.2 0.2 - 1.0 mg/dL   Nitrite, UA Negative Negative   Microscopic Examination See below:    Assessment & Plan:   1. Gross hematuria (Primary) Persistent microscopic hematuria today.  Her pain has resolved, however the punctate stone that she brought to clinic would not account for the 4 mm possible proximal left ureteral stone seen on prior KUB.  Additionally, with her concerns regarding the indeterminate left hemipelvic radiopaque density, I recommended pursuing CT stone study for further evaluation as previously discussed.  I gave her the phone number for scheduling and we will contact her with the results. - Urinalysis, Complete   Return for Will call with results.  Carman Ching, PA-C  [facility] [place] [address] [place], [place] [address] [phone]

## 2024-03-21 NOTE — Patient Instructions (Signed)
Please call [phone] to schedule your CT scan.

## 2024-03-24 ENCOUNTER — Encounter: Payer: Self-pay | Admitting: *Deleted

## 2024-03-28 ENCOUNTER — Ambulatory Visit
Admission: RE | Admit: 2024-03-28 | Discharge: 2024-03-28 | Disposition: A | Discharge status: Home / Self Care | Source: Ambulatory Visit | Attending: Physician Assistant | Admitting: Physician Assistant

## 2024-03-28 DIAGNOSIS — R109 Unspecified abdominal pain: Secondary | ICD-10-CM | POA: Diagnosis not present

## 2024-03-28 DIAGNOSIS — R310 Gross hematuria: Secondary | ICD-10-CM | POA: Diagnosis not present

## 2024-03-28 DIAGNOSIS — N200 Calculus of kidney: Secondary | ICD-10-CM | POA: Diagnosis not present

## 2024-03-28 DIAGNOSIS — K5730 Diverticulosis of large intestine without perforation or abscess without bleeding: Secondary | ICD-10-CM | POA: Diagnosis not present

## 2024-03-28 DIAGNOSIS — N2889 Other specified disorders of kidney and ureter: Secondary | ICD-10-CM | POA: Diagnosis not present

## 2024-04-04 ENCOUNTER — Encounter
Admission: RE | Disposition: A | Discharge status: Home / Self Care | Payer: Self-pay | Source: Home / Self Care | Attending: Gastroenterology

## 2024-04-04 ENCOUNTER — Ambulatory Visit: Admitting: Certified Registered Nurse Anesthetist

## 2024-04-04 ENCOUNTER — Ambulatory Visit
Admission: RE | Admit: 2024-04-04 | Discharge: 2024-04-04 | Disposition: A | Discharge status: Home / Self Care | Attending: Gastroenterology | Admitting: Gastroenterology

## 2024-04-04 DIAGNOSIS — Z87891 Personal history of nicotine dependence: Secondary | ICD-10-CM | POA: Insufficient documentation

## 2024-04-04 DIAGNOSIS — I10 Essential (primary) hypertension: Secondary | ICD-10-CM | POA: Diagnosis not present

## 2024-04-04 DIAGNOSIS — K222 Esophageal obstruction: Secondary | ICD-10-CM | POA: Diagnosis not present

## 2024-04-04 HISTORY — PX: ESOPHAGOGASTRODUODENOSCOPY: SHX5428

## 2024-04-04 SURGERY — EGD (ESOPHAGOGASTRODUODENOSCOPY)
Anesthesia: General

## 2024-04-04 MED ORDER — LIDOCAINE HCL (CARDIAC) PF 100 MG/5ML IV SOSY
PREFILLED_SYRINGE | INTRAVENOUS | Status: DC | PRN
  Administered 2024-04-04: 50 mg via INTRAVENOUS

## 2024-04-04 MED ORDER — DEXMEDETOMIDINE HCL IN NACL 80 MCG/20ML IV SOLN
INTRAVENOUS | Status: DC | PRN
  Administered 2024-04-04: 8 ug via INTRAVENOUS

## 2024-04-04 MED ORDER — SODIUM CHLORIDE 0.9 % IV SOLN: INTRAVENOUS | Status: DC

## 2024-04-04 MED ORDER — PROPOFOL 10 MG/ML IV BOLUS
INTRAVENOUS | Status: DC | PRN
Start: 2024-04-04 — End: 2024-04-04
  Administered 2024-04-04: 125 ug/kg/min via INTRAVENOUS
  Administered 2024-04-04: 70 mg via INTRAVENOUS

## 2024-04-04 NOTE — H&P (Signed)
 Outpatient short stay form Pre-procedure 04/04/2024  Regis Bill, MD  Primary Physician: Sallyanne Kuster, NP  Reason for visit:  Schatzki ring  History of present illness:    78 y/o lady with history of hypertension, hyperlipidemia, and breast cancer here for EGD for schatzki ring.No blood thinners. No neck surgeries. No family history of esophageal cancer.     Current Facility-Administered Medications:    0.9 %  sodium chloride infusion, , Intravenous, Continuous, Odin Mariani, Rossie Muskrat, MD, Last Rate: 20 mL/hr at 04/04/24 1004, New Bag at 04/04/24 1004  Medications Prior to Admission  Medication Sig Dispense Refill Last Dose/Taking   amLODipine (NORVASC) 5 MG tablet Take 5 mg by mouth daily.   04/04/2024 Morning   busPIRone (BUSPAR) 5 MG tablet TAKE 1 TABLET BY MOUTH ONCE DAILY 30 tablet 2 04/03/2024   diltiazem (CARDIZEM CD) 120 MG 24 hr capsule Take 1 capsule (120 mg total) by mouth daily. 90 capsule 3 04/04/2024 Morning   gabapentin (NEURONTIN) 100 MG capsule TAKE ONE CAPSULE BY MOUTH EVERY MORNING,ONE CAPSULE IN THE AFTERNOON, AND 3 CAPSULES AT BEDTIME. 150 capsule 3 04/03/2024   lisinopril (ZESTRIL) 20 MG tablet TAKE ONE TABLET BY MOUTH EVERY MORNING AND ONE IN EVENING 180 tablet 3 04/04/2024 Morning   Multiple Vitamin (MULTIVITAMIN WITH MINERALS) TABS tablet Take 1 tablet by mouth daily.    Past Week   rosuvastatin (CRESTOR) 5 MG tablet Take 1 tablet (5 mg total) by mouth daily. 90 tablet 1 04/03/2024   Vitamin D, Ergocalciferol, 50000 units CAPS TAKE 1 CAPSULE BY MOUTH ONCE A WEEK 4 capsule 5 Past Week   acetaminophen (TYLENOL) 500 MG tablet Take 500 mg by mouth daily as needed for moderate pain.       alendronate (FOSAMAX) 70 MG tablet TAKE 1 TABLET EVERY 7 DAYS WITH A FULL GLASS OF WATER ON AN EMPTY STOMACH DO NOT LIE DOWN FOR AT LEAST 30 MIN 4 tablet 3 03/31/2024   aspirin EC 81 MG tablet Take 81 mg by mouth every other day.   03/31/2024   fluticasone (FLONASE) 50 MCG/ACT nasal spray  SPRAY TWICE INTO EACH NOSTRIL ONCE DAILY (Patient taking differently: 1 spray as needed for allergies.) 16 g 3    latanoprost (XALATAN) 0.005 % ophthalmic solution 1 drop at bedtime.      letrozole (FEMARA) 2.5 MG tablet TAKE 1 TABLET BY MOUTH DAILY 30 tablet 2    omeprazole (PRILOSEC) 20 MG capsule Take 1 capsule (20 mg total) by mouth daily. 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    Physical Exam  Gen: Alert, oriented. Appears stated age.  HEENT: PERRLA. Lungs: No respiratory distress CV: RRR Abd: soft, benign, no masses Ext: No edema    Planned procedures: Proceed with colonoscopy. The patient understands the nature of the planned procedure, indications, risks, alternatives and potential complications including but not limited to bleeding, infection, perforation, damage to internal organs and possible oversedation/side effects from anesthesia. The patient agrees and gives consent to proceed.  Please refer to procedure notes for findings, recommendations and patient disposition/instructions.     Regis Bill, MD [facility]

## 2024-04-04 NOTE — Anesthesia Postprocedure Evaluation (Signed)
 Anesthesia Post Note  Patient: Virginia Warner  Procedure(s) Performed: EGD (ESOPHAGOGASTRODUODENOSCOPY) Balloon dilation wire-guided  Patient location during evaluation: Endoscopy Anesthesia Type: General Level of consciousness: awake and alert Pain management: pain level controlled Vital Signs Assessment: post-procedure vital signs reviewed and stable Respiratory status: spontaneous breathing, nonlabored ventilation, respiratory function stable and patient connected to nasal cannula oxygen Cardiovascular status: blood pressure returned to baseline and stable Postop Assessment: no apparent nausea or vomiting Anesthetic complications: no  No notable events documented.   Last Vitals:  Vitals:   04/04/24 1102 04/04/24 1104  BP: 98/62 98/62  Pulse: 74 77  Resp: (!) 23 (!) 21  Temp: (!) 36.3 C   SpO2: 96% 95%    Last Pain:  Vitals:   04/04/24 1122  TempSrc:   PainSc: 0-No pain                 Stephanie Coup

## 2024-04-04 NOTE — Anesthesia Procedure Notes (Signed)
 Date/Time: 04/04/2024 10:45 AM  Performed by: Ginger Carne, CRNAPre-anesthesia Checklist: Patient identified, Emergency Drugs available, Suction available, Patient being monitored and Timeout performed Patient Re-evaluated:Patient Re-evaluated prior to induction Oxygen Delivery Method: Nasal cannula Preoxygenation: Pre-oxygenation with 100% oxygen Induction Type: IV induction

## 2024-04-04 NOTE — Anesthesia Preprocedure Evaluation (Signed)
 Anesthesia Evaluation  Patient identified by MRN, date of birth, ID band Patient awake    Reviewed: Allergy & Precautions, NPO status , Patient's Chart, lab work & pertinent test results  History of Anesthesia Complications Negative for: history of anesthetic complications  Airway Mallampati: II  TM Distance: >3 FB Neck ROM: Full    Dental no notable dental hx. (+) Teeth Intact   Pulmonary neg pulmonary ROS, neg sleep apnea, neg COPD, Patient abstained from smoking.Not current smoker, former smoker   Pulmonary exam normal breath sounds clear to auscultation       Cardiovascular Exercise Tolerance: Good hypertension, (-) CAD and (-) Past MI negative cardio ROS Normal cardiovascular exam(-) dysrhythmias  Rhythm:Regular Rate:Normal - Systolic murmurs    Neuro/Psych negative neurological ROS  negative psych ROS   GI/Hepatic negative GI ROS, Neg liver ROS,GERD  Medicated,,(+)     (-) substance abuse    Endo/Other  negative endocrine ROSneg diabetes    Renal/GU      Musculoskeletal   Abdominal   Peds  Hematology negative hematology ROS (+)   Anesthesia Other Findings Past Medical History: No date: Adjustment insomnia No date: Allergy No date: Arthritis No date: Bacterial vaginitis No date: Breast cancer (HCC) 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 No date: Corns and callosities No date: GERD (gastroesophageal reflux disease) No date: History of kidney stones No date: Hyperlipidemia No date: Hypertension No date: Lymphadenopathy No date: 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) No date: Osteoporosis No date: Personal history of radiation therapy No date: Polyp of colon No date: Pre-diabetes No date: Renal calculi No date: Seasonal allergies  Past Surgical History: No date: ABDOMINAL HYSTERECTOMY     Comment:  partial No date: ABDOMINAL HYSTERECTOMY W/ PARTIAL  VAGINACTOMY; N/A 06/12/2020: BREAST BIOPSY; Left     Comment:  [place] Bx, Ribbon Clip, [facility] 07/04/2020: BREAST LUMPECTOMY; Left     Comment:  [facility] lumpectomy with radiation 07/10/2020: BREAST LUMPECTOMY,RADIO FREQ LOCALIZER,AXILLARY SENTINEL  LYMPH NODE BIOPSY; Left     Comment:  Procedure: BREAST LUMPECTOMY,RADIO FREQ               LOCALIZER,AXILLARY SENTINEL LYMPH NODE BIOPSY;  Surgeon:               Leafy Ro, MD;  Location: [facility] ORS;  Service:               General;  Laterality: Left; 10/18/2015: COLONOSCOPY WITH PROPOFOL; N/A     Comment:  Procedure: COLONOSCOPY WITH PROPOFOL;  Surgeon: Scot Jun, MD;  Location: [facility] ENDOSCOPY;  Service:               Endoscopy;  Laterality: N/A; 08/19/2021: COLONOSCOPY WITH PROPOFOL; N/A     Comment:  Procedure: COLONOSCOPY WITH PROPOFOL;  Surgeon:               Regis Bill, MD;  Location: [facility] ENDOSCOPY;                Service: Endoscopy;  Laterality: N/A; No date: INGUINAL HERNIA REPAIR     Comment:  age 5's No date: POLYPECTOMY  BMI    Body Mass Index: 23.17 kg/m      Reproductive/Obstetrics negative OB ROS  Anesthesia Physical Anesthesia Plan  ASA: 2  Anesthesia Plan: General   Post-op Pain Management:    Induction: Intravenous  PONV Risk Score and Plan: 3 and Ondansetron, Propofol infusion and TIVA  Airway Management Planned: Nasal Cannula  Additional Equipment: None  Intra-op Plan:   Post-operative Plan:   Informed Consent: I have reviewed the patients History and Physical, chart, labs and discussed the procedure including the risks, benefits and alternatives for the proposed anesthesia with the patient or authorized representative who has indicated his/her understanding and acceptance.     Dental advisory given  Plan Discussed with: CRNA and Surgeon  Anesthesia Plan Comments: (Discussed risks of anesthesia with patient, including  possibility of difficulty with spontaneous ventilation under anesthesia necessitating airway intervention, PONV, and rare risks such as cardiac or respiratory or neurological events, and allergic reactions. Patient understands.)       Anesthesia Quick Evaluation

## 2024-04-04 NOTE — Interval H&P Note (Signed)
 History and Physical Interval Note:  04/04/2024 10:40 AM  Virginia Warner  has presented today for surgery, with the diagnosis of Schatzki's ring (K22.2) Dysphagia, unspecified type (R13.10).  The various methods of treatment have been discussed with the patient and family. After consideration of risks, benefits and other options for treatment, the patient has consented to  Procedure(s): EGD (ESOPHAGOGASTRODUODENOSCOPY) (N/A) as a surgical intervention.  The patient's history has been reviewed, patient examined, no change in status, stable for surgery.  I have reviewed the patient's chart and labs.  Questions were answered to the patient's satisfaction.     Regis Bill  Ok to proceed with EGD

## 2024-04-04 NOTE — Op Note (Signed)
 [facility] Gastroenterology Patient Name: Virginia Warner Procedure Date: 04/04/2024 10:37 AM MRN: [id number] Account #: [id number] Date of Birth: 03/10/1946 Admit Type: Outpatient Age: 78 Room: [facility] ENDO ROOM 3 Gender: Female Note Status: Finalized Instrument Name: Upper Endoscope [id number] Procedure:             Upper GI endoscopy Indications:           Stricture of the esophagus Providers:             Eather Colas MD, MD Referring MD:          Sallyanne Kuster (Referring MD) Medicines:             Monitored Anesthesia Care Complications:         No immediate complications. Estimated blood loss:                         Minimal. Procedure:             Pre-Anesthesia Assessment:                        - Prior to the procedure, a History and Physical was                         performed, and patient medications and allergies were                         reviewed. The patient is competent. The risks and                         benefits of the procedure and the sedation options and                         risks were discussed with the patient. All questions                         were answered and informed consent was obtained.                         Patient identification and proposed procedure were                         verified by the physician, the nurse, the                         anesthesiologist, the anesthetist and the technician                         in the endoscopy suite. Mental Status Examination:                         alert and oriented. Airway Examination: normal                         oropharyngeal airway and neck mobility. Respiratory                         Examination: clear to auscultation. CV Examination:  normal. Prophylactic Antibiotics: The patient does not                         require prophylactic antibiotics. Prior                         Anticoagulants: The patient has taken no anticoagulant                          or antiplatelet agents. ASA Grade Assessment: II - A                         patient with mild systemic disease. After reviewing                         the risks and benefits, the patient was deemed in                         satisfactory condition to undergo the procedure. The                         anesthesia plan was to use monitored anesthesia care                         (MAC). Immediately prior to administration of                         medications, the patient was re-assessed for adequacy                         to receive sedatives. The heart rate, respiratory                         rate, oxygen saturations, blood pressure, adequacy of                         pulmonary ventilation, and response to care were                         monitored throughout the procedure. The physical                         status of the patient was re-assessed after the                         procedure.                        After obtaining informed consent, the endoscope was                         passed under direct vision. Throughout the procedure,                         the patient's blood pressure, pulse, and oxygen                         saturations were monitored continuously. The Endoscope  was introduced through the mouth, and advanced to the                         second part of duodenum. The upper GI endoscopy was                         accomplished without difficulty. The patient tolerated                         the procedure well. Findings:      A moderate Schatzki ring was found in the lower third of the esophagus.       A TTS dilator was passed through the scope. Dilation with a 12-13.5-15       mm balloon dilator was performed to 15 mm. The dilation site was       examined and showed moderate mucosal disruption. Estimated blood loss       was minimal.      The entire examined stomach was normal.      The examined duodenum was  normal. Impression:            - Moderate Schatzki ring. Dilated.                        - Normal stomach.                        - Normal examined duodenum.                        - No specimens collected. Recommendation:        - Discharge patient to home.                        - Resume previous diet.                        - Continue present medications.                        - Repeat upper endoscopy in 1 month for retreatment.                        - Return to referring physician as previously                         scheduled. Procedure Code(s):     --- Professional ---                        [phone], Esophagogastroduodenoscopy, flexible,                         transoral; with transendoscopic balloon dilation of                         esophagus (less than 30 mm diameter) Diagnosis Code(s):     --- Professional ---                        K22.2, Esophageal obstruction CPT copyright 2022 American Medical Association. All rights reserved. The codes documented in this report are preliminary and upon coder  review may  be revised to meet current compliance requirements. Eather Colas MD, MD 04/04/2024 11:02:20 AM Number of Addenda: 0 Note Initiated On: 04/04/2024 10:37 AM Estimated Blood Loss:  Estimated blood loss was minimal.      [facility]

## 2024-04-04 NOTE — Transfer of Care (Signed)
 Immediate Anesthesia Transfer of Care Note  Patient: Virginia Warner  Procedure(s) Performed: EGD (ESOPHAGOGASTRODUODENOSCOPY) Balloon dilation wire-guided  Patient Location: Endoscopy Unit  Anesthesia Type:General  Level of Consciousness: sedated and drowsy  Airway & Oxygen Therapy: Patient Spontanous Breathing  Post-op Assessment: Report given to RN and Post -op Vital signs reviewed and stable  Post vital signs: Reviewed and stable  Last Vitals:  Vitals Value Taken Time  BP 98/62 04/04/24 1103  Temp    Pulse 77 04/04/24 1104  Resp 21 04/04/24 1104  SpO2 95 % 04/04/24 1104  Vitals shown include unfiled device data.  Last Pain:  Vitals:   04/04/24 0953  TempSrc: Temporal         Complications: No notable events documented.

## 2024-04-05 ENCOUNTER — Encounter: Payer: Self-pay | Admitting: Gastroenterology

## 2024-04-13 ENCOUNTER — Encounter: Payer: Self-pay | Admitting: Oncology

## 2024-04-13 ENCOUNTER — Other Ambulatory Visit: Payer: Self-pay | Admitting: Oncology

## 2024-04-13 ENCOUNTER — Inpatient Hospital Stay: Payer: 59 | Attending: Oncology | Admitting: Oncology

## 2024-04-13 VITALS — BP 146/77 | HR 86 | Temp 96.0°F | Resp 19 | Ht 64.0 in | Wt 134.7 lb

## 2024-04-13 DIAGNOSIS — Z923 Personal history of irradiation: Secondary | ICD-10-CM | POA: Diagnosis not present

## 2024-04-13 DIAGNOSIS — C50912 Malignant neoplasm of unspecified site of left female breast: Secondary | ICD-10-CM

## 2024-04-13 DIAGNOSIS — Z79811 Long term (current) use of aromatase inhibitors: Secondary | ICD-10-CM | POA: Diagnosis not present

## 2024-04-13 DIAGNOSIS — Z170 Estrogen receptor positive status [ER+]: Secondary | ICD-10-CM | POA: Diagnosis not present

## 2024-04-13 DIAGNOSIS — Z1721 Progesterone receptor positive status: Secondary | ICD-10-CM | POA: Diagnosis not present

## 2024-04-13 DIAGNOSIS — C50512 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 breast: Secondary | ICD-10-CM | POA: Insufficient documentation

## 2024-04-13 DIAGNOSIS — Z1732 Human epidermal growth factor receptor 2 negative status: Secondary | ICD-10-CM | POA: Diagnosis not present

## 2024-04-13 DIAGNOSIS — Z08 Encounter for follow-up examination after completed treatment for malignant neoplasm: Secondary | ICD-10-CM | POA: Diagnosis not present

## 2024-04-13 DIAGNOSIS — Z79899 Other long term (current) drug therapy: Secondary | ICD-10-CM

## 2024-04-13 DIAGNOSIS — M85831 Other specified disorders of bone density and structure, right forearm: Secondary | ICD-10-CM | POA: Diagnosis not present

## 2024-04-13 DIAGNOSIS — Z87891 Personal history of nicotine dependence: Secondary | ICD-10-CM | POA: Insufficient documentation

## 2024-04-13 NOTE — Progress Notes (Signed)
 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  This was followed by diagnostic mammogram and ultrasound which showed a 7 x 7 x 4 mm mass 3 cm from the nipple at the 7 o'clock position.  Normal-appearing left axillary lymph nodes.  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  ER strongly positive greater than 90%, PR 1 to 10% positive and HER-2 negative   Patient underwent lumpectomy and sentinel lymph node biopsy on 07/10/2020.  Final pathology showed 8 mm grade 1 invasive mucinous carcinoma with negative margins.  Sentinel lymph nodes were negative for malignancy.   Patient completed adjuvant radiation therapy and started letrozole in November 2021    Interval history-  she is doing well for her age and denies any complaints at this time.  Specifically denies any breast concerns and changes in her appetite or weight.  Denies any new aches and pains anywhere.  No recent hospitalizations.  She did undergo endoscopy for esophageal  stenosis and reports improvement in her swallowing since then  ECOG PS- 1 Pain scale- 0  Review of systems- Review of Systems  Constitutional:  Negative for chills, fever, malaise/fatigue and weight loss.  HENT:  Negative for congestion, ear discharge and nosebleeds.   Eyes:  Negative for blurred vision.  Respiratory:  Negative for cough, hemoptysis, sputum production, shortness of breath and wheezing.   Cardiovascular:  Negative for chest pain, palpitations, orthopnea and claudication.  Gastrointestinal:  Negative for abdominal pain, blood in stool, constipation, diarrhea, heartburn, melena, nausea and vomiting.  Genitourinary:  Negative for dysuria, flank pain, frequency, hematuria and urgency.  Musculoskeletal:  Negative for back pain, joint pain and myalgias.  Skin:  Negative for rash.  Neurological:  Negative for dizziness, tingling, focal weakness, seizures, weakness and headaches.  Endo/Heme/Allergies:  Does not bruise/bleed easily.  Psychiatric/Behavioral:  Negative for depression and suicidal ideas. The patient does not have insomnia.       Allergies  Allergen Reactions   Aspirin Nausea And Vomiting    Cannot tolerate unless enteric coated   Atorvastatin Other (See Comments)    Bone pain     Past Medical History:  Diagnosis Date   Adjustment insomnia    Allergy    Arthritis    Bacterial vaginitis    Breast cancer (HCC)    Congenital deformities of feet    Congenital deformities of feet    Corns and callosities    GERD (gastroesophageal reflux disease)    History of kidney stones    Hyperlipidemia    Hypertension    Lymphadenopathy    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)    Osteoporosis    Personal history of radiation therapy    Polyp of colon    Pre-diabetes    Renal calculi    Seasonal allergies      Past Surgical History:  Procedure Laterality Date   ABDOMINAL HYSTERECTOMY     partial   ABDOMINAL HYSTERECTOMY W/  PARTIAL VAGINACTOMY N/A    BREAST BIOPSY Left 06/12/2020   [place] Bx, Ribbon Clip, [facility]   BREAST LUMPECTOMY Left 07/04/2020   [facility] lumpectomy with radiation   BREAST LUMPECTOMY,RADIO FREQ LOCALIZER,AXILLARY SENTINEL LYMPH NODE BIOPSY Left 07/10/2020   Procedure: BREAST LUMPECTOMY,RADIO FREQ LOCALIZER,AXILLARY SENTINEL LYMPH NODE BIOPSY;  Surgeon: Leafy Ro, MD;  Location: [facility] ORS;  Service: General;  Laterality: Left;   COLONOSCOPY WITH PROPOFOL N/A 10/18/2015   Procedure: COLONOSCOPY WITH PROPOFOL;  Surgeon: Scot Jun, MD;  Location: [facility] ENDOSCOPY;  Service: Endoscopy;  Laterality: N/A;   COLONOSCOPY WITH PROPOFOL N/A 08/19/2021   Procedure: COLONOSCOPY WITH PROPOFOL;  Surgeon: Regis Bill, MD;  Location: [facility] ENDOSCOPY;  Service: Endoscopy;  Laterality: N/A;   ESOPHAGOGASTRODUODENOSCOPY N/A 04/04/2024   Procedure: EGD (ESOPHAGOGASTRODUODENOSCOPY);  Surgeon: Regis Bill, MD;  Location: [facility] ENDOSCOPY;  Service: Endoscopy;  Laterality: N/A;   ESOPHAGOGASTRODUODENOSCOPY (EGD) WITH PROPOFOL N/A 03/07/2024   Procedure: ESOPHAGOGASTRODUODENOSCOPY (EGD) WITH PROPOFOL;  Surgeon: Regis Bill, MD;  Location: [facility] ENDOSCOPY;  Service: Endoscopy;  Laterality: N/A;   INGUINAL HERNIA REPAIR     age 52's   POLYPECTOMY      Social History   Socioeconomic History   Marital status: Divorced    Spouse name: Not on file   Number of children: Not on file   Years of education: Not on file   Highest education level: Not on file  Occupational History   Not on file  Tobacco Use   Smoking status: Former    Current packs/day: 0.00    Types: Cigarettes    Quit date: 11/2007    Years since quitting: 16.3    Passive exposure: Past   Smokeless tobacco: Never  Vaping Use   Vaping status: Never Used  Substance and Sexual Activity   Alcohol use: Not Currently    Alcohol/week: 0.0 standard drinks of alcohol   Drug use: No   Sexual activity: Not Currently  Other Topics Concern    Not on file  Social History Narrative   Not on file   Social Drivers of Health   Financial Resource Strain: Not on file  Food Insecurity: Not on file  Transportation Needs: Not on file  Physical Activity: Not on file  Stress: Not on file  Social Connections: Not on file  Intimate Partner Violence: Not on file    Family History  Problem Relation Age of Onset   Cancer Brother    Breast cancer Paternal Aunt      Current Outpatient Medications:    acetaminophen (TYLENOL) 500 MG tablet, Take 500 mg by mouth daily as needed for moderate pain. , Disp: , Rfl:    alendronate (FOSAMAX) 70 MG tablet, TAKE 1 TABLET EVERY 7 DAYS WITH A FULL GLASS OF WATER ON AN EMPTY STOMACH DO NOT LIE DOWN FOR AT LEAST 30 MIN, Disp: 4 tablet, Rfl: 3   amLODipine (NORVASC) 5 MG tablet, Take 5 mg by mouth daily., Disp: , Rfl:    aspirin EC 81 MG tablet, Take 81 mg by mouth every other day., Disp: , Rfl:    busPIRone (BUSPAR) 5 MG tablet,  TAKE 1 TABLET BY MOUTH ONCE DAILY, Disp: 30 tablet, Rfl: 2   diltiazem (CARDIZEM CD) 120 MG 24 hr capsule, Take 1 capsule (120 mg total) by mouth daily., Disp: 90 capsule, Rfl: 3   fluticasone (FLONASE) 50 MCG/ACT nasal spray, SPRAY TWICE INTO EACH NOSTRIL ONCE DAILY (Patient taking differently: 1 spray as needed for allergies.), Disp: 16 g, Rfl: 3   gabapentin (NEURONTIN) 100 MG capsule, TAKE ONE CAPSULE BY MOUTH EVERY MORNING,ONE CAPSULE IN THE AFTERNOON, AND 3 CAPSULES AT BEDTIME., Disp: 150 capsule, Rfl: 3   latanoprost (XALATAN) 0.005 % ophthalmic solution, 1 drop at bedtime., Disp: , Rfl:    letrozole (FEMARA) 2.5 MG tablet, TAKE 1 TABLET BY MOUTH DAILY, Disp: 30 tablet, Rfl: 2   lisinopril (ZESTRIL) 20 MG tablet, TAKE ONE TABLET BY MOUTH EVERY MORNING AND ONE IN EVENING, Disp: 180 tablet, Rfl: 3   Multiple Vitamin (MULTIVITAMIN WITH MINERALS) TABS tablet, Take 1 tablet by mouth daily. , Disp: , Rfl:    omeprazole (PRILOSEC) 20 MG capsule, Take 1 capsule (20 mg total)  by mouth daily. (Patient taking differently: Take 20 mg by mouth as needed.), Disp: 30 capsule, Rfl: 2   rosuvastatin (CRESTOR) 5 MG tablet, Take 1 tablet (5 mg total) by mouth daily., Disp: 90 tablet, Rfl: 1   Vitamin D, Ergocalciferol, 50000 units CAPS, TAKE 1 CAPSULE BY MOUTH ONCE A WEEK, Disp: 4 capsule, Rfl: 5  Physical exam:  Vitals:   04/13/24 1137  BP: (!) 146/77  Pulse: 86  Resp: 19  Temp: (!) 96 F (35.6 C)  TempSrc: Tympanic  SpO2: 100%  Weight: 134 lb 11.2 oz (61.1 kg)  Height: 5\' 4"  (1.626 m)   Physical Exam Cardiovascular:     Rate and Rhythm: Normal rate and regular rhythm.     Heart sounds: Normal heart sounds.  Pulmonary:     Effort: Pulmonary effort is normal.     Breath sounds: Normal breath sounds.  Skin:    General: Skin is warm and dry.  Neurological:     Mental Status: She is alert and oriented to person, place, and time.    Breast exam was performed in seated and lying down position. Patient is status post left lumpectomy with a well-healed surgical scar. No evidence of any palpable masses. No evidence of axillary adenopathy. No evidence of any palpable masses or lumps in the right breast. No evidence of right axillary adenopathy   I have personally reviewed labs listed below:    Latest Ref Rng & Units 06/17/2023    7:28 AM  CMP  Glucose 70 - 99 mg/dL 161   BUN 8 - 23 mg/dL 13   Creatinine 0.96 - 1.00 mg/dL 0.45   Sodium 409 - 811 mmol/L 143   Potassium 3.5 - 5.1 mmol/L 3.8   Chloride 98 - 111 mmol/L 105   CO2 22 - 32 mmol/L 28   Calcium 8.9 - 10.3 mg/dL 9.2   Total Protein 6.5 - 8.1 g/dL 7.9   Total Bilirubin 0.3 - 1.2 mg/dL 0.7   Alkaline Phos 38 - 126 U/L 39   AST 15 - 41 U/L 19   ALT 0 - 44 U/L 17       Latest Ref Rng & Units 06/17/2023    7:28 AM  CBC  WBC 4.0 - 10.5 K/uL 5.6   Hemoglobin 12.0 - 15.0 g/dL 91.4   Hematocrit 78.2 - 46.0 % 44.3   Platelets 150 - 400 K/uL 383  I have personally reviewed Radiology images listed  below: No images are attached to the encounter.  CT RENAL STONE STUDY Result Date: 03/29/2024 CLINICAL DATA:  Abdominal flank pain EXAM: CT ABDOMEN AND PELVIS WITHOUT CONTRAST TECHNIQUE: Multidetector CT imaging of the abdomen and pelvis was performed following the standard protocol without IV contrast. RADIATION DOSE REDUCTION: This exam was performed according to the departmental dose-optimization program which includes automated exposure control, adjustment of the mA and/or kV according to patient size and/or use of iterative reconstruction technique. COMPARISON:  KUB March 11, 2024 FINDINGS: Lower chest: No acute abnormality. Hepatobiliary: No focal liver abnormality is seen. No gallstones, gallbladder wall thickening, or biliary dilatation. Pancreas: Unremarkable. No pancreatic ductal dilatation or surrounding inflammatory changes. Spleen: Normal in size without focal abnormality. Adrenals/Urinary Tract: Adrenal glands are normal. Right kidney is unremarkable without calcifications or hydronephrosis Left kidney demonstrates multiple calcifications 3-4 mm in the upper pole and several calcifications between 5 and 9 mm in the lower pole calices consistent with left nephrolithiasis without evidence of ureterolithiasis or hydronephrosis. No bladder stones. Stomach/Bowel: Stomach is within normal limits. Appendix appears normal. No evidence of bowel wall thickening, distention, or inflammatory changes. Diffuse diverticulosis sigmoid colon without diverticulitis Vascular/Lymphatic: Aortic atherosclerosis. No enlarged abdominal or pelvic lymph nodes. Reproductive: Status post hysterectomy. No adnexal masses. Other: No abdominal wall hernia or abnormality. No abdominopelvic ascites. 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 IMPRESSION: *Left nephrolithiasis without evidence of ureterolithiasis or hydronephrosis.  *Diverticulosis sigmoid colon without diverticulitis. *Aortic atherosclerosis. Electronically Signed   By: Fredrich Jefferson M.D.   On: 03/29/2024 13:54     Assessment and plan- Patient is a 78 y.o. female  with history of stage I left breast cancer ER/PR positive HER2 negative.   Patient started taking letrozole in November 2021 and will continue up until November 2026.  She is overall tolerating it well without any significant side effects.  She is compliant with calcium and vitamin DE.  She is currently on Fosamax for her osteopenia.  We will plan to repeat her bone density scan again in June 2025 and she will also be due for her mammogram at that time.  Clinically she is doing well with no concerning signs and symptoms of recurrence based on today's exam.  I will see her back in 6 months no labs.   Visit Diagnosis 1. Osteopenia of right forearm   2. Encounter for follow-up surveillance of breast cancer   3. Use of letrozole (Femara)   4. High risk medication use      Dr. Seretha Dance, MD, MPH [facility] [phone] 04/13/2024 1:05 PM

## 2024-04-20 ENCOUNTER — Ambulatory Visit (INDEPENDENT_AMBULATORY_CARE_PROVIDER_SITE_OTHER): Admitting: Nurse Practitioner

## 2024-04-20 VITALS — BP 130/75 | HR 88 | Temp 98.2°F | Resp 16 | Ht 64.0 in | Wt 133.6 lb

## 2024-04-20 DIAGNOSIS — I10 Essential (primary) hypertension: Secondary | ICD-10-CM | POA: Diagnosis not present

## 2024-04-20 DIAGNOSIS — D1721 Benign lipomatous neoplasm of skin and subcutaneous tissue of right arm: Secondary | ICD-10-CM

## 2024-04-20 DIAGNOSIS — E559 Vitamin D deficiency, unspecified: Secondary | ICD-10-CM | POA: Diagnosis not present

## 2024-04-20 DIAGNOSIS — I700 Atherosclerosis of aorta: Secondary | ICD-10-CM | POA: Diagnosis not present

## 2024-04-20 DIAGNOSIS — F411 Generalized anxiety disorder: Secondary | ICD-10-CM

## 2024-04-20 DIAGNOSIS — E538 Deficiency of other specified B group vitamins: Secondary | ICD-10-CM | POA: Diagnosis not present

## 2024-04-20 DIAGNOSIS — R7303 Prediabetes: Secondary | ICD-10-CM

## 2024-04-20 MED ORDER — DILTIAZEM HCL ER COATED BEADS 120 MG PO CP24: 120.0000 mg | ORAL_CAPSULE | Freq: Every day | ORAL | 3 refills | Status: AC

## 2024-04-20 MED ORDER — BUSPIRONE HCL 5 MG PO TABS: 5.0000 mg | ORAL_TABLET | Freq: Every day | ORAL | 2 refills | Status: DC

## 2024-04-20 NOTE — Progress Notes (Signed)
 [facility] [address] [place], [place] [address]  Internal MEDICINE  Office Visit Note  Patient Name: Virginia Warner  [id number]  [id number]  Date of Service: 04/20/2024  Chief Complaint  Patient presents with   Lipoma   Medication Refill   Follow-up    HPI Keiasha presents for a follow-up visit for esophageal strictures, lipoma, overactive bladder, kidney stones.  Esophageal strictures -- Has 1 more eosphageal dilation Mammogram due in June Kidney stones -- Still has some blood in urine, has multiple small kidney stones Overactive bladder Allergra for allergies.  Lipoma -- Has a lipoma in the back of the right arm that has been there for years. It does not cause any pain or bother her .  Due for routine labs for upcoming AWV.     Current Medication: Outpatient Encounter Medications as of 04/20/2024  Medication Sig   acetaminophen  (TYLENOL ) 500 MG tablet Take 500 mg by mouth daily as needed for moderate pain.    alendronate  (FOSAMAX ) 70 MG tablet TAKE 1 TABLET EVERY 7 DAYS WITH A FULL GLASS OF WATER ON AN EMPTY STOMACH DO NOT LIE DOWN FOR AT LEAST 30 MIN   amLODipine  (NORVASC ) 5 MG tablet Take 5 mg by mouth daily.   aspirin EC 81 MG tablet Take 81 mg by mouth every other day.   busPIRone  (BUSPAR ) 5 MG tablet Take 1 tablet (5 mg total) by mouth daily.   diltiazem  (CARDIZEM  CD) 120 MG 24 hr capsule Take 1 capsule (120 mg total) by mouth daily.   fluticasone  (FLONASE ) 50 MCG/ACT nasal spray SPRAY TWICE INTO EACH NOSTRIL ONCE DAILY (Patient taking differently: 1 spray as needed for allergies.)   gabapentin  (NEURONTIN ) 100 MG capsule TAKE ONE CAPSULE BY MOUTH EVERY MORNING,ONE CAPSULE IN THE AFTERNOON, AND 3 CAPSULES AT BEDTIME.   latanoprost  (XALATAN ) 0.005 % ophthalmic solution 1 drop at bedtime.   lisinopril  (ZESTRIL ) 20 MG tablet TAKE ONE TABLET BY MOUTH EVERY MORNING AND ONE IN EVENING   Multiple Vitamin (MULTIVITAMIN WITH MINERALS) TABS tablet Take 1 tablet by  mouth daily.    omeprazole  (PRILOSEC) 20 MG capsule Take 1 capsule (20 mg total) by mouth daily. (Patient taking differently: Take 20 mg by mouth as needed.)   rosuvastatin  (CRESTOR ) 5 MG tablet Take 1 tablet (5 mg total) by mouth daily.   Vitamin D , Ergocalciferol , 50000 units CAPS TAKE 1 CAPSULE BY MOUTH ONCE A WEEK   [DISCONTINUED] busPIRone  (BUSPAR ) 5 MG tablet TAKE 1 TABLET BY MOUTH ONCE DAILY   [DISCONTINUED] diltiazem  (CARDIZEM  CD) 120 MG 24 hr capsule Take 1 capsule (120 mg total) by mouth daily.   [DISCONTINUED] letrozole  (FEMARA ) 2.5 MG tablet TAKE 1 TABLET BY MOUTH DAILY   No facility-administered encounter medications on file as of 04/20/2024.    Surgical History: Past Surgical History:  Procedure Laterality Date   ABDOMINAL HYSTERECTOMY     partial   ABDOMINAL HYSTERECTOMY W/ PARTIAL VAGINACTOMY N/A    BREAST BIOPSY Left 06/12/2020   US  Bx, [place] Cl[facility], [facility]   BREAST LUMPECTOMY Left 07/04/2020   [facility] lumpectomy with radiation   BREAST LUMPECTOMY,RADIO FREQ LOCALIZER,AXILLARY SENTINEL LYMPH NODE BIOPSY Left 07/10/2020   Procedure: BREAST LUMPECTOMY,RADIO FREQ LOCALIZER,AXILLARY SENTINEL LYMPH NODE BIOPSY;  Surgeon: Alben Alma, MD;  Location: [facility] ORS;  Service: General;  Laterality: Left;   COLONOSCOPY WITH PROPOFOL  N/A 10/18/2015   Procedure: COLONOSCOPY WITH PROPOFOL ;  Surgeon: Cassie Click, MD;  Location: [facility] ENDOSCOPY;  Service: Endoscopy;  Laterality: N/A;  COLONOSCOPY WITH PROPOFOL  N/A 08/19/2021   Procedure: COLONOSCOPY WITH PROPOFOL ;  Surgeon: Shane Darling, MD;  Location: [facility] ENDOSCOPY;  Service: Endoscopy;  Laterality: N/A;   ESOPHAGOGASTRODUODENOSCOPY N/A 04/04/2024   Procedure: EGD (ESOPHAGOGASTRODUODENOSCOPY);  Surgeon: Shane Darling, MD;  Location: [facility] ENDOSCOPY;  Service: Endoscopy;  Laterality: N/A;   ESOPHAGOGASTRODUODENOSCOPY N/A 05/03/2024   Procedure: EGD (ESOPHAGOGASTRODUODENOSCOPY);  Surgeon: Shane Darling, MD;  Location: [facility]  ENDOSCOPY;  Service: Endoscopy;  Laterality: N/A;   ESOPHAGOGASTRODUODENOSCOPY (EGD) WITH PROPOFOL  N/A 03/07/2024   Procedure: ESOPHAGOGASTRODUODENOSCOPY (EGD) WITH PROPOFOL ;  Surgeon: Shane Darling, MD;  Location: [facility] ENDOSCOPY;  Service: Endoscopy;  Laterality: N/A;   INGUINAL HERNIA REPAIR     age 61's   POLYPECTOMY      Medical History: Past Medical History:  Diagnosis Date   Adjustment insomnia    Allergy    Arthritis    Bacterial vaginitis    Breast cancer (HCC)    Congenital deformities of feet    Congenital deformities of feet    Corns and callosities    Esophageal stricture    GERD (gastroesophageal reflux disease)    History of kidney stones    Hyperlipidemia    Hypertension    Lymphadenopathy    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)    Osteoporosis    Personal history of radiation therapy    Polyp of colon    Pre-diabetes    Renal calculi    Seasonal allergies     Family History: Family History  Problem Relation Age of Onset   Cancer Brother    Breast cancer Paternal Aunt     Social History   Socioeconomic History   Marital status: Divorced    Spouse name: Not on file   Number of children: Not on file   Years of education: Not on file   Highest education level: Not on file  Occupational History   Not on file  Tobacco Use   Smoking status: Former    Current packs/day: 0.00    Types: Cigarettes    Quit date: 11/2007    Years since quitting: 16.4    Passive exposure: Past   Smokeless tobacco: Never  Vaping Use   Vaping status: Never Used  Substance and Sexual Activity   Alcohol use: Not Currently    Alcohol/week: 0.0 standard drinks of alcohol   Drug use: No   Sexual activity: Not Currently  Other Topics Concern   Not on file  Social History Narrative   Not on file   Social Drivers of Health   Financial Resource Strain: Not on file  Food Insecurity: Not on file  Transportation Needs: Not on file   Physical Activity: Not on file  Stress: Not on file  Social Connections: Not on file  Intimate Partner Violence: Not on file      Review of Systems  Constitutional:  Negative for appetite change, fatigue and fever.  HENT:  Negative for congestion, mouth sores and postnasal drip.   Respiratory: Negative.  Negative for cough, chest tightness and shortness of breath.   Cardiovascular: Negative.  Negative for chest pain and palpitations.  Gastrointestinal: Negative.   Genitourinary: Negative.  Negative for flank pain.  Musculoskeletal:  Positive for arthralgias.  Skin:        Large lipoma on back of right upper arm  Neurological: Negative.  Negative for headaches.  Psychiatric/Behavioral:  Negative for behavioral problems, self-injury, sleep disturbance and suicidal ideas. The patient  is nervous/anxious.     Vital Signs: BP 130/75 Comment: 150/92  Pulse 88   Temp 98.2 F (36.8 C)   Resp 16   Ht 5\' 4"  (1.626 m)   Wt 133 lb 9.6 oz (60.6 kg)   SpO2 94%   BMI 22.93 kg/m    Physical Exam Vitals reviewed.  Constitutional:      General: She is not in acute distress.    Appearance: Normal appearance. She is normal weight. She is not ill-appearing.  HENT:     Head: Normocephalic and atraumatic.  Eyes:     Pupils: Pupils are equal, round, and reactive to light.  Cardiovascular:     Rate and Rhythm: Normal rate and regular rhythm.  Pulmonary:     Effort: Pulmonary effort is normal. No respiratory distress.  Skin:    Comments: Large lipoma noted on the back of the right upper arm.   Neurological:     Mental Status: She is alert and oriented to person, place, and time.  Psychiatric:        Mood and Affect: Mood normal.        Behavior: Behavior normal.        Assessment/Plan: 1. Essential hypertension (Primary) Continue diltiazem  as prescribed. Routine labs ordered.  - diltiazem  (CARDIZEM  CD) 120 MG 24 hr capsule; Take 1 capsule (120 mg total) by mouth daily.   Dispense: 90 capsule; Refill: 3 - CBC with Differential/Platelet - CMP14+EGFR - Lipid Profile - Hgb A1C w/o eAG  2. Prediabetes Routine labs ordered  - CBC with Differential/Platelet - CMP14+EGFR - Lipid Profile - Hgb A1C w/o eAG  3. Aortic atherosclerosis (HCC) Routine labs ordered  - CBC with Differential/Platelet - CMP14+EGFR - Lipid Profile - Hgb A1C w/o eAG  4. Lipoma of right upper extremity Discussed referral for general surgery if she desires to have it removed. For now she wants to wait  5. B12 deficiency Routine labs ordered  - CBC with Differential/Platelet - B12 and Folate Panel  6. Vitamin D  deficiency Routine lab ordered  - Vitamin D  (25 hydroxy)  7. GAD (generalized anxiety disorder) Continue buspirone  as prescribed.  - busPIRone  (BUSPAR ) 5 MG tablet; Take 1 tablet (5 mg total) by mouth daily.  Dispense: 30 tablet; Refill: 2   General Counseling: Vela verbalizes understanding of the findings of todays visit and agrees with plan of treatment. I have discussed any further diagnostic evaluation that may be needed or ordered today. We also reviewed her medications today. she has been encouraged to call the office with any questions or concerns that should arise related to todays visit.    Orders Placed This Encounter  Procedures   CBC with Differential/Platelet   CMP14+EGFR   Lipid Profile   Vitamin D  (25 hydroxy)   Hgb A1C w/o eAG   B12 and Folate Panel    Meds ordered this encounter  Medications   diltiazem  (CARDIZEM  CD) 120 MG 24 hr capsule    Sig: Take 1 capsule (120 mg total) by mouth daily.    Dispense:  90 capsule    Refill:  3    NEXT REFILL   busPIRone  (BUSPAR ) 5 MG tablet    Sig: Take 1 tablet (5 mg total) by mouth daily.    Dispense:  30 tablet    Refill:  2    PT NEED REFILL PLEASE    Return for previously scheduled, AWV, Christ Fullenwider PCP in june.   Total time spent:30 Minutes Time spent includes review  of chart, medications, test  results, and follow up plan with the patient.   [place] Controlled Substance Database was reviewed by me.  This patient was seen by Laurence Pons, FNP-C in collaboration with Dr. Verneta Gone as a part of collaborative care agreement.   Dondrea Clendenin R. Bobbi Burow, MSN, FNP-C Internal medicine

## 2024-04-21 ENCOUNTER — Telehealth: Payer: Self-pay | Admitting: Nurse Practitioner

## 2024-04-25 ENCOUNTER — Other Ambulatory Visit: Payer: Self-pay | Admitting: Oncology

## 2024-04-26 ENCOUNTER — Encounter: Payer: Self-pay | Admitting: *Deleted

## 2024-05-03 ENCOUNTER — Encounter
Admission: RE | Disposition: A | Discharge status: Home / Self Care | Payer: Self-pay | Source: Home / Self Care | Attending: Gastroenterology

## 2024-05-03 ENCOUNTER — Ambulatory Visit: Admitting: Anesthesiology

## 2024-05-03 ENCOUNTER — Encounter: Payer: Self-pay | Admitting: *Deleted

## 2024-05-03 ENCOUNTER — Ambulatory Visit
Admission: RE | Admit: 2024-05-03 | Discharge: 2024-05-03 | Disposition: A | Discharge status: Home / Self Care | Attending: Gastroenterology | Admitting: Gastroenterology

## 2024-05-03 ENCOUNTER — Other Ambulatory Visit: Payer: Self-pay

## 2024-05-03 DIAGNOSIS — Z87891 Personal history of nicotine dependence: Secondary | ICD-10-CM | POA: Insufficient documentation

## 2024-05-03 DIAGNOSIS — K219 Gastro-esophageal reflux disease without esophagitis: Secondary | ICD-10-CM | POA: Diagnosis not present

## 2024-05-03 DIAGNOSIS — E785 Hyperlipidemia, unspecified: Secondary | ICD-10-CM | POA: Insufficient documentation

## 2024-05-03 DIAGNOSIS — K222 Esophageal obstruction: Secondary | ICD-10-CM | POA: Insufficient documentation

## 2024-05-03 DIAGNOSIS — I10 Essential (primary) hypertension: Secondary | ICD-10-CM | POA: Diagnosis not present

## 2024-05-03 HISTORY — PX: ESOPHAGOGASTRODUODENOSCOPY: SHX5428

## 2024-05-03 HISTORY — DX: Esophageal obstruction: K22.2

## 2024-05-03 SURGERY — EGD (ESOPHAGOGASTRODUODENOSCOPY)
Anesthesia: General

## 2024-05-03 MED ORDER — LIDOCAINE HCL (CARDIAC) PF 100 MG/5ML IV SOSY
PREFILLED_SYRINGE | INTRAVENOUS | Status: DC | PRN
  Administered 2024-05-03: 60 mg via INTRAVENOUS

## 2024-05-03 MED ORDER — PROPOFOL 500 MG/50ML IV EMUL
INTRAVENOUS | Status: DC | PRN
  Administered 2024-05-03: 50 ug/kg/min via INTRAVENOUS

## 2024-05-03 MED ORDER — PROPOFOL 10 MG/ML IV BOLUS
INTRAVENOUS | Status: DC | PRN
  Administered 2024-05-03: 30 mg via INTRAVENOUS
  Administered 2024-05-03: 40 mg via INTRAVENOUS

## 2024-05-03 MED ORDER — GLYCOPYRROLATE 0.2 MG/ML IJ SOLN
INTRAMUSCULAR | Status: DC | PRN
  Administered 2024-05-03: .2 mg via INTRAVENOUS

## 2024-05-03 MED ORDER — SODIUM CHLORIDE 0.9 % IV SOLN: INTRAVENOUS | Status: DC

## 2024-05-03 NOTE — Anesthesia Postprocedure Evaluation (Signed)
 Anesthesia Post Note  Patient: Virginia Warner  Procedure(s) Performed: EGD (ESOPHAGOGASTRODUODENOSCOPY) Balloon dilation wire-guided  Patient location during evaluation: Endoscopy Anesthesia Type: General Level of consciousness: awake and alert Pain management: pain level controlled Vital Signs Assessment: post-procedure vital signs reviewed and stable Respiratory status: spontaneous breathing, nonlabored ventilation, respiratory function stable and patient connected to nasal cannula oxygen Cardiovascular status: blood pressure returned to baseline and stable Postop Assessment: no apparent nausea or vomiting Anesthetic complications: no   No notable events documented.   Last Vitals:  Vitals:   05/03/24 1050 05/03/24 1100  BP:  (!) 145/97  Pulse: 94 78  Resp: 14   Temp:    SpO2: 100% 100%    Last Pain:  Vitals:   05/03/24 1100  TempSrc:   PainSc: 0-No pain                 Portia Brittle Appolonia Ackert

## 2024-05-03 NOTE — Op Note (Signed)
 [facility] Patient Name: Virginia Warner Procedure Date: 05/03/2024 10:25 AM MRN: [id number] Account #: [id number] Date of Birth: [birth year] Admit Type: Outpatient Age: 78 Room: [facility] ENDO ROOM 1 Gender: Female Note Status: Finalized Instrument Name: Cristino Donna Endoscope [id number] Procedure:             Upper GI endoscopy Indications:           Stenosis of the esophagus Providers:             Leida Puna MD, MD Medicines:             Monitored Anesthesia Care Complications:         No immediate complications. Estimated blood loss:                         Minimal. Procedure:             Pre-Anesthesia Assessment:                        - Prior to the procedure, a History and Physical was                         performed, and patient medications and allergies were                         reviewed. The patient is competent. The risks and                         benefits of the procedure and the sedation options and                         risks were discussed with the patient. All questions                         were answered and informed consent was obtained.                         Patient identification and proposed procedure were                         verified by the physician, the nurse, the                         anesthesiologist, the anesthetist and the technician                         in the endoscopy suite. Mental Status Examination:                         alert and oriented. Airway Examination: normal                         oropharyngeal airway and neck mobility. Respiratory                         Examination: clear to auscultation. CV Examination:                         normal. Prophylactic Antibiotics: The patient does not  require prophylactic antibiotics. Prior                         Anticoagulants: The patient has taken no anticoagulant                         or antiplatelet agents. ASA Grade Assessment:  III - A                         patient with severe systemic disease. After reviewing                         the risks and benefits, the patient was deemed in                         satisfactory condition to undergo the procedure. The                         anesthesia plan was to use monitored anesthesia care                         (MAC). Immediately prior to administration of                         medications, the patient was re-assessed for adequacy                         to receive sedatives. The heart rate, respiratory                         rate, oxygen saturations, blood pressure, adequacy of                         pulmonary ventilation, and response to care were                         monitored throughout the procedure. The physical                         status of the patient was re-assessed after the                         procedure.                        After obtaining informed consent, the endoscope was                         passed under direct vision. Throughout the procedure,                         the patient's blood pressure, pulse, and oxygen                         saturations were monitored continuously. The Endoscope                         was introduced through the mouth, and advanced to the  second part of duodenum. The upper GI endoscopy was                         accomplished without difficulty. The patient tolerated                         the procedure well. Findings:      A non-obstructing Schatzki ring was found at the gastroesophageal       junction. A TTS dilator was passed through the scope. Dilation with a       15-16.5-18 mm balloon dilator was performed to 18 mm. The dilation site       was examined and showed mild mucosal disruption. Estimated blood loss       was minimal.      The entire examined stomach was normal.      The examined duodenum was normal. Impression:            - Non-obstructing Schatzki ring.  Dilated.                        - Normal stomach.                        - Normal examined duodenum.                        - No specimens collected. Recommendation:        - Discharge patient to home.                        - Resume previous diet.                        - Continue present medications.                        - Return to referring physician as previously                         scheduled. Procedure Code(s):     --- Professional ---                        [id number], Esophagogastroduodenoscopy, flexible,                         transoral; with transendoscopic balloon dilation of                         esophagus (less than 30 mm diameter) Diagnosis Code(s):     --- Professional ---                        K22.2, Esophageal obstruction CPT copyright 2022 American Medical Association. All rights reserved. The codes documented in this report are preliminary and upon coder review may  be revised to meet current compliance requirements. Leida Puna MD, MD 05/03/2024 10:42:17 AM Number of Addenda: 0 Note Initiated On: 05/03/2024 10:25 AM Estimated Blood Loss:  Estimated blood loss was minimal.      [facility]

## 2024-05-03 NOTE — H&P (Signed)
 Outpatient short stay form Pre-procedure 05/03/2024  Shane Darling, MD  Primary Physician: Laurence Pons, NP  Reason for visit:  Esophageal Stricture  History of present illness:    78 y/o lady with history of hypertension, hyperlipidemia, and breast cancer here for EGD for schatzki ring. No blood thinners. No neck surgeries. No family history of esophageal cancer.     Current Facility-Administered Medications:    0.9 %  sodium chloride  infusion, , Intravenous, Continuous, Danyeal Akens, Leanora Prophet, MD, Last Rate: 20 mL/hr at 05/03/24 1019, Continued from Pre-op at 05/03/24 1019  Facility-Administered Medications Ordered in Other Encounters:    glycopyrrolate  (ROBINUL ) injection, , Intravenous, Anesthesia Intra-op, Heriberto London, CRNA, 0.2 mg at 05/03/24 1019  Medications Prior to Admission  Medication Sig Dispense Refill Last Dose/Taking   acetaminophen  (TYLENOL ) 500 MG tablet Take 500 mg by mouth daily as needed for moderate pain.    Past Month   alendronate  (FOSAMAX ) 70 MG tablet TAKE 1 TABLET EVERY 7 DAYS WITH A FULL GLASS OF WATER ON AN EMPTY STOMACH DO NOT LIE DOWN FOR AT LEAST 30 MIN 4 tablet 3 Past Week   amLODipine  (NORVASC ) 5 MG tablet Take 5 mg by mouth daily.   05/03/2024 Morning   aspirin EC 81 MG tablet Take 81 mg by mouth every other day.   05/02/2024   busPIRone  (BUSPAR ) 5 MG tablet Take 1 tablet (5 mg total) by mouth daily. 30 tablet 2 05/02/2024   diltiazem  (CARDIZEM  CD) 120 MG 24 hr capsule Take 1 capsule (120 mg total) by mouth daily. 90 capsule 3 05/03/2024 Morning   fluticasone  (FLONASE ) 50 MCG/ACT nasal spray SPRAY TWICE INTO EACH NOSTRIL ONCE DAILY (Patient taking differently: 1 spray as needed for allergies.) 16 g 3 05/02/2024   gabapentin  (NEURONTIN ) 100 MG capsule TAKE ONE CAPSULE BY MOUTH EVERY MORNING,ONE CAPSULE IN THE AFTERNOON, AND 3 CAPSULES AT BEDTIME. 150 capsule 3 05/02/2024   latanoprost  (XALATAN ) 0.005 % ophthalmic solution 1 drop at bedtime.   05/02/2024    letrozole  (FEMARA ) 2.5 MG tablet TAKE 1 TABLET BY MOUTH DAILY 30 tablet 2 05/02/2024   lisinopril  (ZESTRIL ) 20 MG tablet TAKE ONE TABLET BY MOUTH EVERY MORNING AND ONE IN EVENING 180 tablet 3 05/02/2024   Multiple Vitamin (MULTIVITAMIN WITH MINERALS) TABS tablet Take 1 tablet by mouth daily.    05/02/2024   omeprazole  (PRILOSEC) 20 MG capsule Take 1 capsule (20 mg total) by mouth daily. (Patient taking differently: Take 20 mg by mouth as needed.) 30 capsule 2 05/02/2024   rosuvastatin  (CRESTOR ) 5 MG tablet Take 1 tablet (5 mg total) by mouth daily. 90 tablet 1 05/02/2024   Vitamin D , Ergocalciferol , 50000 units CAPS TAKE 1 CAPSULE BY MOUTH ONCE A WEEK 4 capsule 5 Past Week     Allergies  Allergen Reactions   Aspirin Nausea And Vomiting    Cannot tolerate unless enteric coated   Atorvastatin Other (See Comments)    Bone pain     Past Medical History:  Diagnosis Date   Adjustment insomnia    Allergy    Arthritis    Bacterial vaginitis    Breast cancer (HCC)    Congenital deformities of feet    Congenital deformities of feet    Corns and callosities    Esophageal stricture    GERD (gastroesophageal reflux disease)    History of kidney stones    Hyperlipidemia    Hypertension    Lymphadenopathy    Malignant neoplasm of lower-outer quadrant of left  female breast [facility])    Osteoporosis    Personal history of radiation therapy    Polyp of colon    Pre-diabetes    Renal calculi    Seasonal allergies     Review of systems:  Otherwise negative.    Physical Exam  Gen: Alert, oriented. Appears stated age.  HEENT: PERRLA. Lungs: No respiratory distress CV: RRR Abd: soft, benign, no masses Ext: No edema    Planned procedures: Proceed with EGD. The patient understands the nature of the planned procedure, indications, risks, alternatives and potential complications including but not limited to bleeding, infection, perforation, damage to internal organs and possible oversedation/side  effects from anesthesia. The patient agrees and gives consent to proceed.  Please refer to procedure notes for findings, recommendations and patient disposition/instructions.     Shane Darling, MD [facility]

## 2024-05-03 NOTE — Transfer of Care (Signed)
 Immediate Anesthesia Transfer of Care Note  Patient: Virginia Warner  Procedure(s) Performed: EGD (ESOPHAGOGASTRODUODENOSCOPY) Balloon dilation wire-guided  Patient Location: PACU  Anesthesia Type:General  Level of Consciousness: sedated  Airway & Oxygen Therapy: Patient Spontanous Breathing  Post-op Assessment: Report given to RN and Post -op Vital signs reviewed and stable  Post vital signs: Reviewed and stable  Last Vitals:  Vitals Value Taken Time  BP    Temp    Pulse    Resp    SpO2      Last Pain:  Vitals:   05/03/24 1003  TempSrc: Temporal  PainSc: 0-No pain         Complications: No notable events documented.

## 2024-05-03 NOTE — Anesthesia Preprocedure Evaluation (Signed)
 Anesthesia Evaluation  Patient identified by MRN, date of birth, ID band Patient awake    Reviewed: Allergy & Precautions, NPO status , Patient's Chart, lab work & pertinent test results  History of Anesthesia Complications Negative for: history of anesthetic complications  Airway Mallampati: III  TM Distance: <3 FB Neck ROM: full    Dental  (+) Missing   Pulmonary neg shortness of breath, former smoker   Pulmonary exam normal        Cardiovascular Exercise Tolerance: Good hypertension, (-) angina Normal cardiovascular exam     Neuro/Psych negative neurological ROS  negative psych ROS   GI/Hepatic Neg liver ROS,GERD  Controlled,,  Endo/Other  negative endocrine ROS    Renal/GU Renal disease  negative genitourinary   Musculoskeletal   Abdominal   Peds  Hematology negative hematology ROS (+)   Anesthesia Other Findings Patient reports that they do not think that any food or pills are stuck in their throat at this time.  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) No date: Osteoporosis No date: Personal history of radiation therapy No date: Polyp of colon No date: Pre-diabetes No date: Renal calculi No date: Seasonal allergies  Past Surgical History: No date: ABDOMINAL HYSTERECTOMY     Comment:  partial No date: ABDOMINAL HYSTERECTOMY W/ PARTIAL VAGINACTOMY; N/A 06/12/2020: BREAST BIOPSY; Left     Comment:  US  Bx, Ribbon Clip,  [facility] 07/04/2020: BREAST LUMPECTOMY; Left     Comment:  [facility] lumpectomy with radiation 07/10/2020: BREAST LUMPECTOMY,RADIO FREQ LOCALIZER,AXILLARY SENTINEL  LYMPH NODE BIOPSY; Left     Comment:  Procedure: BREAST LUMPECTOMY,RADIO FREQ               LOCALIZER,AXILLARY SENTINEL LYMPH NODE BIOPSY;  Surgeon:               Alben Alma, MD;  Location: [facility] ORS;  Service:               General;  Laterality: Left; 10/18/2015: COLONOSCOPY WITH PROPOFOL ; N/A     Comment:  Procedure: COLONOSCOPY WITH PROPOFOL ;  Surgeon: Cassie Click, MD;  Location: [facility] ENDOSCOPY;  Service:               Endoscopy;  Laterality: N/A; 08/19/2021: COLONOSCOPY WITH PROPOFOL ; N/A     Comment:  Procedure: COLONOSCOPY WITH PROPOFOL ;  Surgeon:               Shane Darling, MD;  Location: [facility] ENDOSCOPY;                Service: Endoscopy;  Laterality: N/A; 04/04/2024: ESOPHAGOGASTRODUODENOSCOPY; N/A     Comment:  Procedure: EGD (ESOPHAGOGASTRODUODENOSCOPY);  Surgeon:               Shane Darling, MD;  Location: [facility] ENDOSCOPY;                Service: Endoscopy;  Laterality: N/A; 03/07/2024: ESOPHAGOGASTRODUODENOSCOPY (EGD) WITH PROPOFOL ; N/A     Comment:  Procedure: ESOPHAGOGASTRODUODENOSCOPY (EGD) WITH  PROPOFOL ;  Surgeon: Shane Darling, MD;  Location:               [facility] ENDOSCOPY;  Service: Endoscopy;  Laterality: N/A; No date: INGUINAL HERNIA REPAIR     Comment:  age 32's No date: POLYPECTOMY  BMI    Body Mass Index: 22.80 kg/m      Reproductive/Obstetrics negative OB ROS                             Anesthesia Physical Anesthesia Plan  ASA: 3  Anesthesia Plan: General   Post-op Pain Management:    Induction: Intravenous  PONV Risk Score and Plan: Propofol  infusion and TIVA  Airway Management Planned: Natural Airway and Nasal Cannula  Additional Equipment:   Intra-op Plan:   Post-operative Plan:   Informed Consent: I have  reviewed the patients History and Physical, chart, labs and discussed the procedure including the risks, benefits and alternatives for the proposed anesthesia with the patient or authorized representative who has indicated his/her understanding and acceptance.     Dental Advisory Given  Plan Discussed with: Anesthesiologist, CRNA and Surgeon  Anesthesia Plan Comments: (Patient consented for risks of anesthesia including but not limited to:  - adverse reactions to medications - risk of airway placement if required - damage to eyes, teeth, lips or other oral mucosa - nerve damage due to positioning  - sore throat or hoarseness - Damage to heart, brain, nerves, lungs, other parts of body or loss of life  Patient voiced understanding and assent.)       Anesthesia Quick Evaluation

## 2024-05-03 NOTE — Interval H&P Note (Signed)
 History and Physical Interval Note:  05/03/2024 10:25 AM  Virginia Warner  has presented today for surgery, with the diagnosis of Esophageal Stricture Dysphagia.  The various methods of treatment have been discussed with the patient and family. After consideration of risks, benefits and other options for treatment, the patient has consented to  Procedure(s): EGD (ESOPHAGOGASTRODUODENOSCOPY) (N/A) as a surgical intervention.  The patient's history has been reviewed, patient examined, no change in status, stable for surgery.  I have reviewed the patient's chart and labs.  Questions were answered to the patient's satisfaction.     Shane Darling  Ok to proceed with EGD

## 2024-05-05 ENCOUNTER — Encounter: Payer: Self-pay | Admitting: Emergency Medicine

## 2024-05-05 ENCOUNTER — Emergency Department
Admission: EM | Admit: 2024-05-05 | Discharge: 2024-05-05 | Disposition: A | Discharge status: Home / Self Care | Attending: Emergency Medicine | Admitting: Emergency Medicine

## 2024-05-05 ENCOUNTER — Emergency Department

## 2024-05-05 ENCOUNTER — Other Ambulatory Visit: Payer: Self-pay

## 2024-05-05 DIAGNOSIS — R0602 Shortness of breath: Secondary | ICD-10-CM | POA: Insufficient documentation

## 2024-05-05 DIAGNOSIS — R0789 Other chest pain: Secondary | ICD-10-CM | POA: Insufficient documentation

## 2024-05-05 DIAGNOSIS — J432 Centrilobular emphysema: Secondary | ICD-10-CM | POA: Diagnosis not present

## 2024-05-05 DIAGNOSIS — R000 Tachycardia, unspecified: Secondary | ICD-10-CM | POA: Insufficient documentation

## 2024-05-05 DIAGNOSIS — I2699 Other pulmonary embolism without acute cor pulmonale: Secondary | ICD-10-CM | POA: Diagnosis not present

## 2024-05-05 DIAGNOSIS — I10 Essential (primary) hypertension: Secondary | ICD-10-CM | POA: Diagnosis not present

## 2024-05-05 DIAGNOSIS — R059 Cough, unspecified: Secondary | ICD-10-CM | POA: Insufficient documentation

## 2024-05-05 DIAGNOSIS — R0600 Dyspnea, unspecified: Secondary | ICD-10-CM

## 2024-05-05 LAB — CBC
HCT: 40.2 % (ref 36.0–46.0)
Hemoglobin: 12.5 g/dL (ref 12.0–15.0)
MCH: 27.4 pg (ref 26.0–34.0)
MCHC: 31.1 g/dL (ref 30.0–36.0)
MCV: 88 fL (ref 80.0–100.0)
Platelets: 350 10*3/uL (ref 150–400)
RBC: 4.57 MIL/uL (ref 3.87–5.11)
RDW: 13.6 % (ref 11.5–15.5)
WBC: 9.6 10*3/uL (ref 4.0–10.5)
nRBC: 0 % (ref 0.0–0.2)

## 2024-05-05 LAB — BASIC METABOLIC PANEL WITH GFR
Anion gap: 11 (ref 5–15)
BUN: 10 mg/dL (ref 8–23)
CO2: 25 mmol/L (ref 22–32)
Calcium: 9.2 mg/dL (ref 8.9–10.3)
Chloride: 103 mmol/L (ref 98–111)
Creatinine, Ser: 0.55 mg/dL (ref 0.44–1.00)
GFR, Estimated: >60 mL/min (ref >60)
Glucose, Bld: 136 mg/dL — ABNORMAL HIGH (ref 70–99)
Potassium: 3.4 mmol/L — ABNORMAL LOW (ref 3.5–5.1)
Sodium: 139 mmol/L (ref 135–145)

## 2024-05-05 LAB — BRAIN NATRIURETIC PEPTIDE: B Natriuretic Peptide: 37.7 pg/mL (ref 0.0–100.0)

## 2024-05-05 LAB — RESP PANEL BY RT-PCR (RSV, FLU A&B, COVID)  RVPGX2
Influenza A by PCR: NEGATIVE
Influenza B by PCR: NEGATIVE
Resp Syncytial Virus by PCR: NEGATIVE
SARS Coronavirus 2 by RT PCR: NEGATIVE

## 2024-05-05 LAB — TROPONIN I (HIGH SENSITIVITY)
Troponin I (High Sensitivity): 4 ng/L (ref <18)
Troponin I (High Sensitivity): 6 ng/L (ref <18)

## 2024-05-05 MED ORDER — LACTATED RINGERS IV BOLUS
1000.0000 mL | Freq: Once | INTRAVENOUS | Status: AC
  Administered 2024-05-05: 1000 mL via INTRAVENOUS

## 2024-05-05 MED ORDER — IOHEXOL 350 MG/ML SOLN
75.0000 mL | Freq: Once | INTRAVENOUS | Status: AC | PRN
  Administered 2024-05-05: 75 mL via INTRAVENOUS

## 2024-05-05 NOTE — ED Notes (Signed)
 Pt to CT

## 2024-05-05 NOTE — ED Provider Notes (Signed)
-----------------------------------------   8:19 AM on 05/05/2024 ----------------------------------------- Patient care assumed from Dr. Drenda Gentle.  Patient's workup is reassuring, CBC is normal chemistry is normal troponin negative x 2.  BNP normal.  Respiratory panel negative.  CTA of the chest is negative for any acute process including PE.  I have updated the patient on the results, she is very reassured by today's workup and results.  Overall the patient appears well speaking in full sentences, reassuring vitals besides mild hypertension.  We will discharge the patient home have her follow-up with her doctor.  Patient is agreeable to this plan of care.   Ruth Cove, MD 05/05/24 [phone]

## 2024-05-05 NOTE — ED Triage Notes (Signed)
 Patient ambulatory to triage with steady gait, without difficulty or distress noted; pt reports having U GI 5/6; since yesterday having SHOB esp with any exertion, occas prod cough with clear sputum; denies any accomp symptoms, no pain

## 2024-05-05 NOTE — ED Notes (Signed)
 Collected urine and sent it it to the lab with temporary sunquest label

## 2024-05-05 NOTE — ED Provider Notes (Signed)
 [facility] Provider Note    None    (approximate)   History   Shortness of Breath   HPI  Virginia Warner is a 78 y.o. female with history of esophageal stricture, GERD, hypertension, hyperlipidemia, presenting with shortness of breath.  States it is worse with exertion, has some chest wall pain.  States that she had been coughing and when she coughs up phlegm, shortness of breath does improve.  States that she had an EGD done 2 days ago.  No leg swelling.  No history of blood clots.  On independent chart review, she had her EGD done on 6 May.  Was also seen by oncology in mid April, has history of osteoporosis, had a mammogram done in 2021 that showed a possible mass in left breast, that was biopsied and showed cancer, she is status post lumpectomy in 2021 as well as radiation therapy and is on letrozole .     Physical Exam   Triage Vital Signs: ED Triage Vitals [05/05/24 0310]  Encounter Vitals Group     BP (!) 166/81     Systolic BP Percentile      Diastolic BP Percentile      Pulse Rate (!) 128     Resp 20     Temp 98.4 F (36.9 C)     Temp src      SpO2 100 %     Weight 133 lb (60.3 kg)     Height 5\' 4"  (1.626 m)     Head Circumference      Peak Flow      Pain Score 0     Pain Loc      Pain Education      Exclude from Growth Chart     Most recent vital signs: Vitals:   05/05/24 0310  BP: (!) 166/81  Pulse: (!) 128  Resp: 20  Temp: 98.4 F (36.9 C)  SpO2: 100%     General: Awake, no distress.  CV:  Good peripheral perfusion.  Resp:  Normal effort.  Tachypneic Abd:  No distention.  Soft nontender Other:  No lower extremity edema, no unilateral calf swelling or tenderness.   ED Results / Procedures / Treatments   Labs (all labs ordered are listed, but only abnormal results are displayed) Labs Reviewed  BASIC METABOLIC PANEL WITH GFR - Abnormal; Notable for the following components:      Result Value   Potassium 3.4 (*)     Glucose, Bld 136 (*)    All other components within normal limits  RESP PANEL BY RT-PCR (RSV, FLU A&B, COVID)  RVPGX2  CBC  TROPONIN I (HIGH SENSITIVITY)  TROPONIN I (HIGH SENSITIVITY)     EKG  EKG shows, sinus tachycardia, rate 117, normal QRS, normal QTc, no ischemic ST elevation, T wave inversion to aVL, not significantly changed compared to prior   RADIOLOGY On my independent interpretation, chest x-ray without focal consolidation   PROCEDURES:  Critical Care performed: No  Procedures   MEDICATIONS ORDERED IN ED: Medications - No data to display   IMPRESSION / MDM / ASSESSMENT AND PLAN / ED COURSE  I reviewed the triage vital signs and the nursing notes.                              Differential diagnosis includes, but is not limited to, ACS, consider CHF but she does not appear volume overloaded  at this time, consider viral illness, pneumonia, PE given her recent procedure.  Will get labs, EKG, troponin, chest x-ray, CT PE.  Will also give her some fluids.  Patient's presentation is most consistent with acute presentation with potential threat to life or bodily function.  Independent interpretation of labs and imaging below.  Patient signed out pending CT imaging results.  The patient is on the cardiac monitor to evaluate for evidence of arrhythmia and/or significant heart rate changes.   Clinical Course as of 05/05/24 0646  Thu May 05, 2024  0646 DG Chest 1 View No active disease.  [TT]  [phone] Independent review of labs, electrolytes not severely deranged, troponin is negative, no leukocytosis, respiratory viral panel is negative. [TT]    Clinical Course User Index [TT] Drenda Gentle, Richard Champion, MD     FINAL CLINICAL IMPRESSION(S) / ED DIAGNOSES   Final diagnoses:  None     Rx / DC Orders   ED Discharge Orders     None        Note:  This document was prepared using Dragon voice recognition software and may include unintentional dictation errors.     Shane Darling, MD 05/05/24 [phone]

## 2024-05-06 NOTE — Telephone Encounter (Signed)
 error

## 2024-05-09 ENCOUNTER — Encounter: Payer: Self-pay | Admitting: Nurse Practitioner

## 2024-05-09 ENCOUNTER — Ambulatory Visit: Admitting: Nurse Practitioner

## 2024-05-09 VITALS — BP 137/83 | HR 94 | Temp 98.2°F | Resp 16 | Ht 64.0 in | Wt 133.8 lb

## 2024-05-09 DIAGNOSIS — K222 Esophageal obstruction: Secondary | ICD-10-CM

## 2024-05-09 DIAGNOSIS — D1721 Benign lipomatous neoplasm of skin and subcutaneous tissue of right arm: Secondary | ICD-10-CM | POA: Diagnosis not present

## 2024-05-09 DIAGNOSIS — I10 Essential (primary) hypertension: Secondary | ICD-10-CM

## 2024-05-09 DIAGNOSIS — I700 Atherosclerosis of aorta: Secondary | ICD-10-CM

## 2024-05-09 NOTE — Progress Notes (Signed)
 [facility] [address] [place], [place] [address]  Internal MEDICINE  Office Visit Note  Patient Name: Virginia Warner  [id number]  [id number]  Date of Service: 05/09/2024  Chief Complaint  Patient presents with   Gastroesophageal Reflux   Hypertension   Hyperlipidemia   Follow-up    ED f/u     HPI Virginia Warner presents for a follow-up visit for ED visit four dyspnea and cough.  ED visit for dyspnea after having her final upper endoscopy for esophageal dilation.  Dyspnea is resolved, chest xray and labs were ok.  Slight hypokalemia at 3.4 on labs. Reports that her swallowing is much more improved since having the procedures done for esophageal dilation.     Current Medication: Outpatient Encounter Medications as of 05/09/2024  Medication Sig   acetaminophen  (TYLENOL ) 500 MG tablet Take 500 mg by mouth daily as needed for moderate pain.    alendronate  (FOSAMAX ) 70 MG tablet TAKE 1 TABLET EVERY 7 DAYS WITH A FULL GLASS OF WATER ON AN EMPTY STOMACH DO NOT LIE DOWN FOR AT LEAST 30 MIN   amLODipine  (NORVASC ) 5 MG tablet Take 5 mg by mouth daily.   aspirin EC 81 MG tablet Take 81 mg by mouth every other day.   busPIRone  (BUSPAR ) 5 MG tablet Take 1 tablet (5 mg total) by mouth daily.   diltiazem  (CARDIZEM  CD) 120 MG 24 hr capsule Take 1 capsule (120 mg total) by mouth daily.   fluticasone  (FLONASE ) 50 MCG/ACT nasal spray SPRAY TWICE INTO EACH NOSTRIL ONCE DAILY (Patient taking differently: 1 spray as needed for allergies.)   gabapentin  (NEURONTIN ) 100 MG capsule TAKE ONE CAPSULE BY MOUTH EVERY MORNING,ONE CAPSULE IN THE AFTERNOON, AND 3 CAPSULES AT BEDTIME.   latanoprost  (XALATAN ) 0.005 % ophthalmic solution 1 drop at bedtime.   letrozole  (FEMARA ) 2.5 MG tablet TAKE 1 TABLET BY MOUTH DAILY   lisinopril  (ZESTRIL ) 20 MG tablet TAKE ONE TABLET BY MOUTH EVERY MORNING AND ONE IN EVENING   Multiple Vitamin (MULTIVITAMIN WITH MINERALS) TABS tablet Take 1 tablet by mouth daily.     omeprazole  (PRILOSEC) 20 MG capsule Take 1 capsule (20 mg total) by mouth daily. (Patient taking differently: Take 20 mg by mouth as needed.)   rosuvastatin  (CRESTOR ) 5 MG tablet Take 1 tablet (5 mg total) by mouth daily.   Vitamin D , Ergocalciferol , 50000 units CAPS TAKE 1 CAPSULE BY MOUTH ONCE A WEEK   No facility-administered encounter medications on file as of 05/09/2024.    Surgical History: Past Surgical History:  Procedure Laterality Date   ABDOMINAL HYSTERECTOMY     partial   ABDOMINAL HYSTERECTOMY W/ PARTIAL VAGINACTOMY N/A    BREAST BIOPSY Left 06/12/2020   US  Bx, Ribbon Clip, [facility]   BREAST LUMPECTOMY Left 07/04/2020   [facility] lumpectomy with radiation   BREAST LUMPECTOMY,RADIO FREQ LOCALIZER,AXILLARY SENTINEL LYMPH NODE BIOPSY Left 07/10/2020   Procedure: BREAST LUMPECTOMY,RADIO FREQ LOCALIZER,AXILLARY SENTINEL LYMPH NODE BIOPSY;  Surgeon: Alben Alma, MD;  Location: [facility] ORS;  Service: General;  Laterality: Left;   COLONOSCOPY WITH PROPOFOL  N/A 10/18/2015   Procedure: COLONOSCOPY WITH PROPOFOL ;  Surgeon: Cassie Click, MD;  Location: [facility] ENDOSCOPY;  Service: Endoscopy;  Laterality: N/A;   COLONOSCOPY WITH PROPOFOL  N/A 08/19/2021   Procedure: COLONOSCOPY WITH PROPOFOL ;  Surgeon: Shane Darling, MD;  Location: [facility] ENDOSCOPY;  Service: Endoscopy;  Laterality: N/A;   ESOPHAGOGASTRODUODENOSCOPY N/A 04/04/2024   Procedure: EGD (ESOPHAGOGASTRODUODENOSCOPY);  Surgeon: Shane Darling, MD;  Location: [facility] ENDOSCOPY;  Service: Endoscopy;  Laterality: N/A;   ESOPHAGOGASTRODUODENOSCOPY N/A 05/03/2024   Procedure: EGD (ESOPHAGOGASTRODUODENOSCOPY);  Surgeon: Shane Darling, MD;  Location: [facility] ENDOSCOPY;  Service: Endoscopy;  Laterality: N/A;   ESOPHAGOGASTRODUODENOSCOPY (EGD) WITH PROPOFOL  N/A 03/07/2024   Procedure: ESOPHAGOGASTRODUODENOSCOPY (EGD) WITH PROPOFOL ;  Surgeon: Shane Darling, MD;  Location: [facility] ENDOSCOPY;  Service: Endoscopy;  Laterality: N/A;   INGUINAL  HERNIA REPAIR     age 58's   POLYPECTOMY      Medical History: Past Medical History:  Diagnosis Date   Adjustment insomnia    Allergy    Arthritis    Bacterial vaginitis    Breast cancer (HCC)    Congenital deformities of feet    Congenital deformities of feet    Corns and callosities    Esophageal stricture    GERD (gastroesophageal reflux disease)    History of kidney stones    Hyperlipidemia    Hypertension    Lymphadenopathy    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)    Osteoporosis    Personal history of radiation therapy    Polyp of colon    Pre-diabetes    Renal calculi    Seasonal allergies     Family History: Family History  Problem Relation Age of Onset   Cancer Brother    Breast cancer Paternal Aunt     Social History   Socioeconomic History   Marital status: Divorced    Spouse name: Not on file   Number of children: Not on file   Years of education: Not on file   Highest education level: Not on file  Occupational History   Not on file  Tobacco Use   Smoking status: Former    Current packs/day: 0.00    Types: Cigarettes    Quit date: 11/2007    Years since quitting: 16.4    Passive exposure: Past   Smokeless tobacco: Never  Vaping Use   Vaping status: Never Used  Substance and Sexual Activity   Alcohol use: Not Currently    Alcohol/week: 0.0 standard drinks of alcohol   Drug use: No   Sexual activity: Not Currently  Other Topics Concern   Not on file  Social History Narrative   Not on file   Social Drivers of Health   Financial Resource Strain: Not on file  Food Insecurity: Not on file  Transportation Needs: Not on file  Physical Activity: Not on file  Stress: Not on file  Social Connections: Not on file  Intimate Partner Violence: Not on file      Review of Systems  Constitutional:  Negative for appetite change, fatigue and fever.  HENT:  Negative for congestion, mouth sores and postnasal  drip.   Respiratory: Negative.  Negative for cough, chest tightness and shortness of breath.   Cardiovascular: Negative.  Negative for chest pain and palpitations.  Gastrointestinal: Negative.   Genitourinary: Negative.  Negative for flank pain.  Musculoskeletal:  Positive for arthralgias.  Neurological: Negative.  Negative for headaches.  Psychiatric/Behavioral:  Negative for behavioral problems, self-injury, sleep disturbance and suicidal ideas. The patient is nervous/anxious.     Vital Signs: BP 137/83   Pulse 94   Temp 98.2 F (36.8 C)   Resp 16   Ht 5\' 4"  (1.626 m)   Wt 133 lb 12.8 oz (60.7 kg)   SpO2 96%   BMI 22.97 kg/m    Physical Exam Vitals reviewed.  Constitutional:      General:  She is not in acute distress.    Appearance: Normal appearance. She is normal weight. She is not ill-appearing.  HENT:     Head: Normocephalic and atraumatic.  Eyes:     Pupils: Pupils are equal, round, and reactive to light.  Cardiovascular:     Rate and Rhythm: Normal rate and regular rhythm.  Pulmonary:     Effort: Pulmonary effort is normal. No respiratory distress.  Neurological:     Mental Status: She is alert and oriented to person, place, and time.  Psychiatric:        Mood and Affect: Mood normal.        Behavior: Behavior normal.        Assessment/Plan: 1. Essential hypertension (Primary) Stable, continue diltiazem , lisinopril  and amlodipine  as prescribed.   2. Lipoma of right upper extremity Discussed possible surgical removal but ensured patient that it is acceptable to do nothing as well as long as it is not bothering her.  3. Esophageal stricture Improved after dilations done by GI. No further intervention.   4. Aortic atherosclerosis (HCC) Continue rosuvastatin  as prescribed.    General Counseling: Virginia Warner understanding of the findings of todays visit and agrees with plan of treatment. I have discussed any further diagnostic evaluation that may  be needed or ordered today. We also reviewed her medications today. she has been encouraged to call the office with any questions or concerns that should arise related to todays visit.    No orders of the defined types were placed in this encounter.   No orders of the defined types were placed in this encounter.   Return if symptoms worsen or fail to improve, for keep upcoming office visit in june.   Total time spent:30 Minutes Time spent includes review of chart, medications, test results, and follow up plan with the patient.   [place] Controlled Substance Database was reviewed by me.  This patient was seen by Laurence Pons, FNP-C in collaboration with Dr. Verneta Gone as a part of collaborative care agreement.   Virginia Grandmaison R. Bobbi Burow, MSN, FNP-C Internal medicine

## 2024-05-16 ENCOUNTER — Encounter: Payer: Self-pay | Admitting: Nurse Practitioner

## 2024-05-22 ENCOUNTER — Encounter: Payer: Self-pay | Admitting: Nurse Practitioner

## 2024-05-27 DIAGNOSIS — H40003 Preglaucoma, unspecified, bilateral: Secondary | ICD-10-CM | POA: Diagnosis not present

## 2024-05-27 DIAGNOSIS — H04123 Dry eye syndrome of bilateral lacrimal glands: Secondary | ICD-10-CM | POA: Diagnosis not present

## 2024-05-31 DIAGNOSIS — H2513 Age-related nuclear cataract, bilateral: Secondary | ICD-10-CM | POA: Diagnosis not present

## 2024-05-31 DIAGNOSIS — H04123 Dry eye syndrome of bilateral lacrimal glands: Secondary | ICD-10-CM | POA: Diagnosis not present

## 2024-05-31 DIAGNOSIS — H40003 Preglaucoma, unspecified, bilateral: Secondary | ICD-10-CM | POA: Diagnosis not present

## 2024-06-02 ENCOUNTER — Other Ambulatory Visit: Payer: Self-pay | Admitting: Internal Medicine

## 2024-06-02 DIAGNOSIS — M810 Age-related osteoporosis without current pathological fracture: Secondary | ICD-10-CM

## 2024-06-13 ENCOUNTER — Other Ambulatory Visit
Admission: RE | Admit: 2024-06-13 | Discharge: 2024-06-13 | Disposition: A | Discharge status: Home / Self Care | Attending: Nurse Practitioner | Admitting: Nurse Practitioner

## 2024-06-13 DIAGNOSIS — R7303 Prediabetes: Secondary | ICD-10-CM | POA: Insufficient documentation

## 2024-06-13 DIAGNOSIS — E538 Deficiency of other specified B group vitamins: Secondary | ICD-10-CM | POA: Insufficient documentation

## 2024-06-13 DIAGNOSIS — F411 Generalized anxiety disorder: Secondary | ICD-10-CM | POA: Insufficient documentation

## 2024-06-13 DIAGNOSIS — I700 Atherosclerosis of aorta: Secondary | ICD-10-CM | POA: Diagnosis not present

## 2024-06-13 DIAGNOSIS — I10 Essential (primary) hypertension: Secondary | ICD-10-CM | POA: Insufficient documentation

## 2024-06-13 DIAGNOSIS — D1721 Benign lipomatous neoplasm of skin and subcutaneous tissue of right arm: Secondary | ICD-10-CM | POA: Insufficient documentation

## 2024-06-13 DIAGNOSIS — E559 Vitamin D deficiency, unspecified: Secondary | ICD-10-CM | POA: Diagnosis not present

## 2024-06-13 LAB — COMPREHENSIVE METABOLIC PANEL WITH GFR
ALT: 15 U/L (ref 0–44)
AST: 17 U/L (ref 15–41)
Albumin: 4.4 g/dL (ref 3.5–5.0)
Alkaline Phosphatase: 31 U/L — ABNORMAL LOW (ref 38–126)
Anion gap: 8 (ref 5–15)
BUN: 11 mg/dL (ref 8–23)
CO2: 27 mmol/L (ref 22–32)
Calcium: 9.6 mg/dL (ref 8.9–10.3)
Chloride: 106 mmol/L (ref 98–111)
Creatinine, Ser: 0.6 mg/dL (ref 0.44–1.00)
GFR, Estimated: >60 mL/min (ref >60)
Glucose, Bld: 113 mg/dL — ABNORMAL HIGH (ref 70–99)
Potassium: 3.7 mmol/L (ref 3.5–5.1)
Sodium: 141 mmol/L (ref 135–145)
Total Bilirubin: 0.9 mg/dL (ref 0.0–1.2)
Total Protein: 7.9 g/dL (ref 6.5–8.1)

## 2024-06-13 LAB — CBC WITH DIFFERENTIAL/PLATELET
Abs Immature Granulocytes: 0.01 10*3/uL (ref 0.00–0.07)
Basophils Absolute: 0.1 10*3/uL (ref 0.0–0.1)
Basophils Relative: 1 %
Eosinophils Absolute: 0.5 10*3/uL (ref 0.0–0.5)
Eosinophils Relative: 10 %
HCT: 41.8 % (ref 36.0–46.0)
Hemoglobin: 13.3 g/dL (ref 12.0–15.0)
Immature Granulocytes: 0 %
Lymphocytes Relative: 51 %
Lymphs Abs: 2.6 10*3/uL (ref 0.7–4.0)
MCH: 27.4 pg (ref 26.0–34.0)
MCHC: 31.8 g/dL (ref 30.0–36.0)
MCV: 86.2 fL (ref 80.0–100.0)
Monocytes Absolute: 0.5 10*3/uL (ref 0.1–1.0)
Monocytes Relative: 9 %
Neutro Abs: 1.5 10*3/uL — ABNORMAL LOW (ref 1.7–7.7)
Neutrophils Relative %: 29 %
Platelets: 356 10*3/uL (ref 150–400)
RBC: 4.85 MIL/uL (ref 3.87–5.11)
RDW: 13.2 % (ref 11.5–15.5)
WBC: 5.2 10*3/uL (ref 4.0–10.5)
nRBC: 0 % (ref 0.0–0.2)

## 2024-06-13 LAB — LIPID PANEL
Cholesterol: 127 mg/dL (ref 0–200)
HDL: 54 mg/dL (ref >40)
LDL Cholesterol: 58 mg/dL (ref 0–99)
Total CHOL/HDL Ratio: 2.4 ratio
Triglycerides: 77 mg/dL (ref <150)
VLDL: 15 mg/dL (ref 0–40)

## 2024-06-13 LAB — FOLATE: Folate: 12.1 ng/mL (ref >5.9)

## 2024-06-13 LAB — VITAMIN B12: Vitamin B-12: 343 pg/mL (ref 180–914)

## 2024-06-13 LAB — HEMOGLOBIN A1C
Hgb A1c MFr Bld: 6 % — ABNORMAL HIGH (ref 4.8–5.6)
Mean Plasma Glucose: 125.5 mg/dL

## 2024-06-13 LAB — VITAMIN D 25 HYDROXY (VIT D DEFICIENCY, FRACTURES): Vit D, 25-Hydroxy: 64.6 ng/mL (ref 30–100)

## 2024-06-14 ENCOUNTER — Encounter: Payer: Self-pay | Admitting: Nurse Practitioner

## 2024-06-14 ENCOUNTER — Ambulatory Visit (INDEPENDENT_AMBULATORY_CARE_PROVIDER_SITE_OTHER): Payer: 59 | Admitting: Nurse Practitioner

## 2024-06-14 VITALS — BP 135/70 | HR 83 | Temp 98.2°F | Resp 16 | Ht 64.0 in | Wt 132.6 lb

## 2024-06-14 DIAGNOSIS — R7303 Prediabetes: Secondary | ICD-10-CM | POA: Diagnosis not present

## 2024-06-14 DIAGNOSIS — I700 Atherosclerosis of aorta: Secondary | ICD-10-CM

## 2024-06-14 DIAGNOSIS — F411 Generalized anxiety disorder: Secondary | ICD-10-CM

## 2024-06-14 DIAGNOSIS — M5416 Radiculopathy, lumbar region: Secondary | ICD-10-CM | POA: Diagnosis not present

## 2024-06-14 DIAGNOSIS — Z0000 Encounter for general adult medical examination without abnormal findings: Secondary | ICD-10-CM

## 2024-06-14 DIAGNOSIS — M48062 Spinal stenosis, lumbar region with neurogenic claudication: Secondary | ICD-10-CM | POA: Diagnosis not present

## 2024-06-14 DIAGNOSIS — E538 Deficiency of other specified B group vitamins: Secondary | ICD-10-CM | POA: Diagnosis not present

## 2024-06-14 DIAGNOSIS — M5126 Other intervertebral disc displacement, lumbar region: Secondary | ICD-10-CM | POA: Diagnosis not present

## 2024-06-14 DIAGNOSIS — K222 Esophageal obstruction: Secondary | ICD-10-CM

## 2024-06-14 DIAGNOSIS — D1721 Benign lipomatous neoplasm of skin and subcutaneous tissue of right arm: Secondary | ICD-10-CM | POA: Diagnosis not present

## 2024-06-14 DIAGNOSIS — I10 Essential (primary) hypertension: Secondary | ICD-10-CM

## 2024-06-14 NOTE — Progress Notes (Signed)
 [facility] [address] [place], [place] [address]  Internal MEDICINE  Office Visit Note  Patient Name: Virginia Warner  [id number]  [id number]  Date of Service: 06/14/2024  Chief Complaint  Patient presents with   Gastroesophageal Reflux   Hypertension   Hyperlipidemia   Medicare Wellness    HPI Virginia Warner presents for a medicare annual wellness visit.  Well-appearing 78 y.o. female with hypertension, GERD, allergic rhinitis, high cholesterol, anxiety and history of breast cancer. She was also treated by GI for esophageal stricture over the past year with multiple EGD with esophageal dilation.  Routine CRC screening: discontinued  Routine mammogram: scheduled for tomorrow  DEXA scan: scheduled for tomorrow  Labs: recent labs done, results discussed with patient  A1c is stable at 6.0 as of yesterday B12 is low normal at 343. Folate is normal Vitamin D  level is normal Cholesterol panel is in normal range now and significantly improved from 6 months ago.  Liver and kidney function are normal, potassium level has improved back to normal range and fasting glucose has decreased to 113, approaching normal range.  CBC is grossly normal.  New or worsening pain: none  Other concerns: kidney stones, sometimes sees a little blood or has a pain but then it goes away.     06/14/2024    1:43 PM 06/09/2023    2:01 PM 06/03/2022    1:57 PM  MMSE - Mini Mental State Exam  Orientation to time 5 5 5   Orientation to Place 5 5 5   Registration 3 3 3   Attention/ Calculation 5 5 5   Recall 3 3 3   Language- name 2 objects 2 2 2   Language- repeat 1 1 1   Language- follow 3 step command 3 3 3   Language- read & follow direction 1 1 1   Write a sentence 1 1 1   Copy design 1 1 1   Total score 30 30 30     Functional Status Survey: Is the patient deaf or have difficulty hearing?: No Does the patient have difficulty seeing, even when wearing glasses/contacts?: No Does the patient have  difficulty concentrating, remembering, or making decisions?: No Does the patient have difficulty walking or climbing stairs?: No Does the patient have difficulty dressing or bathing?: No Does the patient have difficulty doing errands alone such as visiting a doctor's office or shopping?: No     10/02/2022    1:22 PM 02/09/2023    3:59 PM 06/09/2023    2:00 PM 06/17/2023   10:51 AM 06/14/2024    1:41 PM  Fall Risk  Falls in the past year? 0 0 0 0 0  Was there an injury with Fall? 0 0  0 0  Fall Risk Category Calculator 0 0  0 0  Fall Risk Category (Retired) Low       (RETIRED) Patient Fall Risk Level Low fall risk       Patient at Risk for Falls Due to No Fall Risks No Fall Risks   No Fall Risks  Fall risk Follow up Falls evaluation completed  Falls evaluation completed   Falls evaluation completed     Data saved with a previous flowsheet row definition       06/14/2024    1:41 PM  Depression screen PHQ 2/9  Decreased Interest 0  Down, Depressed, Hopeless 0  PHQ - 2 Score 0        Current Medication: Outpatient Encounter Medications as of 06/14/2024  Medication Sig   acetaminophen  (TYLENOL )  500 MG tablet Take 500 mg by mouth daily as needed for moderate pain.    alendronate  (FOSAMAX ) 70 MG tablet TAKE 1 TABLET EVERY 7 DAYS WITH A FULL GLASS OF WATER ON AN EMPTY STOMACH DO NOT LIE DOWN FOR AT LEAST 30 MIN   amLODipine  (NORVASC ) 5 MG tablet Take 5 mg by mouth daily.   aspirin EC 81 MG tablet Take 81 mg by mouth every other day.   busPIRone  (BUSPAR ) 5 MG tablet Take 1 tablet (5 mg total) by mouth daily.   diltiazem  (CARDIZEM  CD) 120 MG 24 hr capsule Take 1 capsule (120 mg total) by mouth daily.   fluticasone  (FLONASE ) 50 MCG/ACT nasal spray SPRAY TWICE INTO EACH NOSTRIL ONCE DAILY (Patient taking differently: 1 spray as needed for allergies.)   gabapentin  (NEURONTIN ) 100 MG capsule TAKE ONE CAPSULE BY MOUTH EVERY MORNING,ONE CAPSULE IN THE AFTERNOON, AND 3 CAPSULES AT BEDTIME.    latanoprost  (XALATAN ) 0.005 % ophthalmic solution 1 drop at bedtime.   letrozole  (FEMARA ) 2.5 MG tablet TAKE 1 TABLET BY MOUTH DAILY   lisinopril  (ZESTRIL ) 20 MG tablet TAKE ONE TABLET BY MOUTH EVERY MORNING AND ONE IN EVENING   Multiple Vitamin (MULTIVITAMIN WITH MINERALS) TABS tablet Take 1 tablet by mouth daily.    omeprazole  (PRILOSEC) 20 MG capsule Take 1 capsule (20 mg total) by mouth daily. (Patient taking differently: Take 20 mg by mouth as needed.)   rosuvastatin  (CRESTOR ) 5 MG tablet Take 1 tablet (5 mg total) by mouth daily.   Vitamin D , Ergocalciferol , 50000 units CAPS TAKE 1 CAPSULE BY MOUTH ONCE A WEEK   No facility-administered encounter medications on file as of 06/14/2024.    Surgical History: Past Surgical History:  Procedure Laterality Date   ABDOMINAL HYSTERECTOMY     partial   ABDOMINAL HYSTERECTOMY W/ PARTIAL VAGINACTOMY N/A    BREAST BIOPSY Left 06/12/2020   [facility]  [facility] Rib[facility] Clip, [facility]   BREAST LUMPECTOMY Left 07/04/2020   [facility] lumpectomy with radiation   BREAST LUMPECTOMY,RADIO FREQ LOCALIZER,AXILLARY SENTINEL LYMPH NODE BIOPSY Left 07/10/2020   Procedure: BREAST LUMPECTOMY,RADIO FREQ LOCALIZER,AXILLARY SENTINEL LYMPH NODE BIOPSY;  Surgeon: Jordis Laneta FALCON, MD;  Location: [facility] ORS;  Service: General;  Laterality: Left;   COLONOSCOPY WITH PROPOFOL  N/A 10/18/2015   Procedure: COLONOSCOPY WITH PROPOFOL ;  Surgeon: Lamar ONEIDA Holmes, MD;  Location: [facility] ENDOSCOPY;  Service: Endoscopy;  Laterality: N/A;   COLONOSCOPY WITH PROPOFOL  N/A 08/19/2021   Procedure: COLONOSCOPY WITH PROPOFOL ;  Surgeon: Maryruth Ole ONEIDA, MD;  Location: [facility] ENDOSCOPY;  Service: Endoscopy;  Laterality: N/A;   ESOPHAGOGASTRODUODENOSCOPY N/A 04/04/2024   Procedure: EGD (ESOPHAGOGASTRODUODENOSCOPY);  Surgeon: Maryruth Ole ONEIDA, MD;  Location[facility] ENDOSCOPY;  Service: Endoscopy;  Laterality: N/A;   ESOPHAGOGASTRODUODENOSCOPY N/A 05/03/2024   Procedure: EGD (ESOPHAGOGASTRODUODENOSCOPY);  Surgeon: Maryruth Ole ONEIDA, MD;  Location: [facility] ENDOSCOPY;  Service: Endoscopy;  Laterality: N/A;   ESOPHAGOGASTRODUODENOSCOPY (EGD) WITH PROPOFOL  N/A 03/07/2024   Procedure: ESOPHAGOGASTRODUODENOSCOPY (EGD) WITH PROPOFOL ;  Surgeon: Maryruth Ole ONEIDA, MD;  Location: [facility] ENDOSCOPY;  Service: Endoscopy;  Laterality: N/A;   INGUINAL HERNIA REPAIR     age 41's   POLYPECTOMY      Medical History: Past Medical History:  Diagnosis Date   Adjustment insomnia    Allergy    Arthritis    Bacterial vaginitis    Breast cancer (HCC)    Congenital deformities of feet    Congenital deformities of feet    Corns and callosities    Esophageal stricture  GERD (gastroesophageal reflux disease)    History of kidney stones    Hyperlipidemia    Hypertension    Lymphadenopathy    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)    Osteoporosis    Personal history of radiation therapy    Polyp of colon    Pre-diabetes    Renal calculi    Seasonal allergies     Family History: Family History  Problem Relation Age of Onset   Cancer Brother    Breast cancer Paternal Aunt     Social History   Socioeconomic History   Marital status: Divorced    Spouse name: Not on file   Number of children: Not on file   Years of education: Not on file   Highest education level: Not on file  Occupational History   Not on file  Tobacco Use   Smoking status: Former    Current packs/day: 0.00    Types: Cigarettes    Quit date: 11/2007    Years since quitting: 16.5    Passive exposure: Past   Smokeless tobacco: Never  Vaping Use   Vaping status: Never Used  Substance and Sexual Activity   Alcohol use: Not Currently    Alcohol/week: 0.0 standard drinks of alcohol   Drug use: No   Sexual activity: Not Currently  Other Topics Concern   Not on file  Social History Narrative   Not on file   Social Drivers of Health   Financial Resource Strain: Not on file  Food Insecurity: Not on file   Transportation Needs: Not on file  Physical Activity: Not on file  Stress: Not on file  Social Connections: Not on file  Intimate Partner Violence: Not on file      Review of Systems  Constitutional: Negative.  Negative for activity change, appetite change, chills, diaphoresis, fatigue, fever and unexpected weight change.  HENT:  Positive for postnasal drip. Negative for congestion, ear discharge, ear pain, facial swelling, hearing loss, nosebleeds, rhinorrhea, sinus pressure, sinus pain, sneezing, sore throat, tinnitus, trouble swallowing and voice change.   Eyes:  Negative for photophobia, pain, discharge, redness, itching and visual disturbance.  Respiratory:  Negative for apnea, cough, choking, chest tightness, shortness of breath, wheezing and stridor.   Cardiovascular: Negative.  Negative for chest pain, palpitations and leg swelling.  Gastrointestinal:  Positive for constipation. Negative for abdominal distention, abdominal pain, anal bleeding, diarrhea, nausea and rectal pain.  Endocrine: Negative for cold intolerance, heat intolerance, polydipsia, polyphagia and polyuria.  Genitourinary:  Positive for flank pain and frequency. Negative for difficulty urinating, genital sores, hematuria, menstrual problem, pelvic pain, urgency, vaginal bleeding, vaginal discharge and vaginal pain.  Musculoskeletal:  Positive for arthralgias and back pain. Negative for gait problem, joint swelling, myalgias and neck pain.  Skin:  Negative for color change, pallor, rash and wound.  Allergic/Immunologic: Negative for environmental allergies, food allergies and immunocompromised state.  Neurological:  Negative for dizziness, seizures, syncope, facial asymmetry, speech difficulty, weakness, light-headedness, numbness and headaches.  Hematological:  Negative for adenopathy. Does not bruise/bleed easily.  Psychiatric/Behavioral:  Negative for agitation, behavioral problems, confusion, decreased  concentration, dysphoric mood, hallucinations, self-injury, sleep disturbance and suicidal ideas. The patient is nervous/anxious (improved with buspirone  once daily). The patient is not hyperactive.     Vital Signs: BP 135/70   Pulse 83   Temp 98.2 F (36.8 C)   Resp 16   Ht 5' 4 (1.626 m)   Wt 136 lb 3.2  oz (61.8 kg)   SpO2 94%   BMI 23.38 kg/m    Physical Exam Vitals reviewed.  Constitutional:      General: She is awake. She is not in acute distress.    Appearance: Normal appearance. She is well-developed, well-groomed and normal weight. She is not ill-appearing or diaphoretic.  HENT:     Head: Normocephalic and atraumatic.     Right Ear: Tympanic membrane, ear canal and external ear normal. There is no impacted cerumen.     Left Ear: Tympanic membrane, ear canal and external ear normal. There is no impacted cerumen.     Nose: Nose normal. No congestion or rhinorrhea.     Mouth/Throat:     Lips: Pink.     Mouth: Mucous membranes are moist.     Pharynx: Oropharynx is clear. Uvula midline. No oropharyngeal exudate or posterior oropharyngeal erythema.  Eyes:     General: Lids are normal. Vision grossly intact. Gaze aligned appropriately.     Extraocular Movements: Extraocular movements intact.     Conjunctiva/sclera: Conjunctivae normal.     Pupils: Pupils are equal, round, and reactive to light.     Funduscopic exam:    Right eye: Red reflex present.        Left eye: Red reflex present. Neck:     Thyroid : No thyromegaly.     Vascular: No carotid bruit or JVD.     Trachea: Trachea and phonation normal. No tracheal deviation.  Cardiovascular:     Rate and Rhythm: Normal rate and regular rhythm.     Pulses: Normal pulses.     Heart sounds: Normal heart sounds, S1 normal and S2 normal. No murmur heard.    No friction rub. No gallop.  Pulmonary:     Effort: Pulmonary effort is normal. No accessory muscle usage or respiratory distress.     Breath sounds: Normal breath  sounds and air entry. No decreased breath sounds, wheezing, rhonchi or rales.  Chest:     Chest wall: No tenderness.     Comments: Declined clinical breast exam Abdominal:     General: Bowel sounds are normal.     Palpations: Abdomen is soft. There is no shifting dullness, fluid wave, mass or pulsatile mass.     Tenderness: There is no abdominal tenderness.  Musculoskeletal:        General: Normal range of motion.     Cervical back: Normal range of motion and neck supple.     Right lower leg: No edema.     Left lower leg: No edema.  Lymphadenopathy:     Cervical: No cervical adenopathy.  Skin:    General: Skin is warm and dry.     Capillary Refill: Capillary refill takes less than 2 seconds.  Neurological:     Mental Status: She is alert and oriented to person, place, and time.     Cranial Nerves: No cranial nerve deficit.     Coordination: Coordination normal.     Gait: Gait normal.  Psychiatric:        Mood and Affect: Mood normal.        Behavior: Behavior normal. Behavior is cooperative.        Thought Content: Thought content normal.        Judgment: Judgment normal.        Assessment/Plan: 1. Encounter for subsequent annual wellness visit (AWV) in Medicare patient (Primary) Age-appropriate preventive screenings and vaccinations discussed. Routine labs for health maintenance results reviewed with patient  today. PHM updated.    2. Essential hypertension Stable, continue lisinopril , diltiazem  and amlodipine  as prescribed.   3. Lipoma of right upper extremity Declined referral to general surgery for removal of lipoma for now. Informed patient that she can call and request the referral any time.   4. Esophageal stricture Follow up with GI, marked improvement per patient since having the upper endoscopy esophageal dilations.   5. Aortic atherosclerosis (HCC) Continue rosuvastatin  as prescribed.   6. Prediabetes Continue limiting carbs and sugars in diet.   7. B12  deficiency Continue OTC B12 supplement as previously discussed   8. GAD (generalized anxiety disorder) Continue buspirone  as prescribed.      General Counseling: darlina mccaughey understanding of the findings of todays visit and agrees with plan of treatment. I have discussed any further diagnostic evaluation that may be needed or ordered today. We also reviewed her medications today. she has been encouraged to call the office with any questions or concerns that should arise related to todays visit.    No orders of the defined types were placed in this encounter.   No orders of the defined types were placed in this encounter.   Return for F/U, Recheck A1C, Zerick Prevette PCP.   Total time spent:30 Minutes Time spent includes review of chart, medications, test results, and follow up plan with the patient.   [place] Controlled Substance Database was reviewed by me.  This patient was seen by Mardy Maxin, FNP-C in collaboration with Dr. Sigrid Bathe as a part of collaborative care agreement.  Chauncy Mangiaracina R. Maxin, MSN, FNP-C Internal medicine

## 2024-06-15 ENCOUNTER — Ambulatory Visit
Admission: RE | Admit: 2024-06-15 | Discharge: 2024-06-15 | Disposition: A | Discharge status: Home / Self Care | Source: Ambulatory Visit | Attending: Oncology | Admitting: Oncology

## 2024-06-15 DIAGNOSIS — Z08 Encounter for follow-up examination after completed treatment for malignant neoplasm: Secondary | ICD-10-CM | POA: Insufficient documentation

## 2024-06-15 DIAGNOSIS — Z79811 Long term (current) use of aromatase inhibitors: Secondary | ICD-10-CM | POA: Diagnosis not present

## 2024-06-15 DIAGNOSIS — Z780 Asymptomatic menopausal state: Secondary | ICD-10-CM | POA: Diagnosis not present

## 2024-06-15 DIAGNOSIS — Z853 Personal history of malignant neoplasm of breast: Secondary | ICD-10-CM | POA: Diagnosis not present

## 2024-06-15 DIAGNOSIS — M85831 Other specified disorders of bone density and structure, right forearm: Secondary | ICD-10-CM

## 2024-06-15 DIAGNOSIS — R92323 Mammographic fibroglandular density, bilateral breasts: Secondary | ICD-10-CM | POA: Diagnosis not present

## 2024-06-15 DIAGNOSIS — M810 Age-related osteoporosis without current pathological fracture: Secondary | ICD-10-CM | POA: Diagnosis not present

## 2024-07-12 DIAGNOSIS — M5416 Radiculopathy, lumbar region: Secondary | ICD-10-CM | POA: Diagnosis not present

## 2024-07-12 DIAGNOSIS — M5126 Other intervertebral disc displacement, lumbar region: Secondary | ICD-10-CM | POA: Diagnosis not present

## 2024-07-12 DIAGNOSIS — M48062 Spinal stenosis, lumbar region with neurogenic claudication: Secondary | ICD-10-CM | POA: Diagnosis not present

## 2024-07-13 ENCOUNTER — Telehealth: Payer: Self-pay | Admitting: Nurse Practitioner

## 2024-07-13 DIAGNOSIS — D1721 Benign lipomatous neoplasm of skin and subcutaneous tissue of right arm: Secondary | ICD-10-CM

## 2024-07-19 DIAGNOSIS — R7303 Prediabetes: Secondary | ICD-10-CM | POA: Insufficient documentation

## 2024-07-19 DIAGNOSIS — E538 Deficiency of other specified B group vitamins: Secondary | ICD-10-CM | POA: Insufficient documentation

## 2024-07-19 DIAGNOSIS — D1721 Benign lipomatous neoplasm of skin and subcutaneous tissue of right arm: Secondary | ICD-10-CM | POA: Insufficient documentation

## 2024-07-19 DIAGNOSIS — I700 Atherosclerosis of aorta: Secondary | ICD-10-CM | POA: Insufficient documentation

## 2024-07-19 DIAGNOSIS — F411 Generalized anxiety disorder: Secondary | ICD-10-CM | POA: Insufficient documentation

## 2024-07-20 ENCOUNTER — Encounter: Payer: Self-pay | Admitting: Nurse Practitioner

## 2024-07-28 ENCOUNTER — Other Ambulatory Visit: Payer: Self-pay | Admitting: Nurse Practitioner

## 2024-07-28 ENCOUNTER — Other Ambulatory Visit: Payer: Self-pay | Admitting: Oncology

## 2024-07-28 DIAGNOSIS — F411 Generalized anxiety disorder: Secondary | ICD-10-CM

## 2024-08-02 NOTE — Telephone Encounter (Signed)
 error

## 2024-08-08 ENCOUNTER — Encounter: Payer: Self-pay | Admitting: Surgery

## 2024-08-08 ENCOUNTER — Ambulatory Visit (INDEPENDENT_AMBULATORY_CARE_PROVIDER_SITE_OTHER): Admitting: Surgery

## 2024-08-08 VITALS — BP 138/77 | HR 90 | Temp 98.1°F | Ht 64.0 in | Wt 135.0 lb

## 2024-08-08 DIAGNOSIS — D1721 Benign lipomatous neoplasm of skin and subcutaneous tissue of right arm: Secondary | ICD-10-CM | POA: Diagnosis not present

## 2024-08-08 NOTE — Progress Notes (Signed)
 Outpatient Surgical Follow Up  08/08/2024  Virginia Warner is an 78 y.o. female.   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  ER strongly positive greater than 90%, PR 1 to 10% positive and HER-2 negative.  Received adjuvant radiation.  She had a recent mammogram that have personally reviewed showing no evidence of any suspicious lesions.  She is tolerating letrozole . No fevers, no chills no new lumps. She is tolerating letrozole . Has no major complaints regarding her breast.  Lumps or bumps.  Denies any discharge. Recent CBC and CMP nml Today comes for an increase in size of Right arm soft tissue mass, she has had this for years but now is more noticeable. 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)    Osteoporosis    Personal history of radiation therapy    Polyp of colon    Pre-diabetes    Renal calculi    Seasonal allergies     Past Surgical History:  Procedure Laterality Date   ABDOMINAL HYSTERECTOMY     partial   ABDOMINAL HYSTERECTOMY W/ PARTIAL VAGINACTOMY N/A    BREAST BIOPSY Left 06/12/2020   US  Bx, Ribbon Clip, [facility]   BREAST LUMPECTOMY Left 07/04/2020   [facility] lumpectomy with radiation   BREAST LUMPECTOMY,RADIO FREQ LOCALIZER,AXILLARY SENTINEL LYMPH NODE BIOPSY Left 07/10/2020   Procedure: BREAST  LUMPECTOMY,RADIO FREQ LOCALIZER,AXILLARY SENTINEL LYMPH NODE BIOPSY;  Surgeon: Jordis Laneta FALCON, MD;  Location: [facility] ORS;  Service: General;  Laterality: Left;   COLONOSCOPY WITH PROPOFOL  N/A 10/18/2015   Procedure: COLONOSCOPY WITH PROPOFOL ;  Surgeon: Lamar ONEIDA Holmes, MD;  Location: [facility] ENDOSCOPY;  Service: Endoscopy;  Laterality: N/A;   COLONOSCOPY WITH PROPOFOL  N/A 08/19/2021   Procedure: COLONOSCOPY WITH PROPOFOL ;  Surgeon: Maryruth Ole ONEIDA, MD;  Location: [facility] ENDOSCOPY;  Service: Endoscopy;  Laterality: N/A;   ESOPHAGOGASTRODUODENOSCOPY N/A 04/04/2024   Procedure: EGD (ESOPHAGOGASTRODUODENOSCOPY);  Surgeon: Maryruth Ole ONEIDA, MD;  Location: [facility] ENDOSCOPY;  Service: Endoscopy;  Laterality: N/A;   ESOPHAGOGASTRODUODENOSCOPY N/A 05/03/2024   Procedure: EGD (ESOPHAGOGASTRODUODENOSCOPY);  Surgeon: Maryruth Ole ONEIDA, MD;  Location[facility] ENDOSCOPY;  Service: Endoscopy;  Laterality: N/A;   ESOPHAGOGASTRODUODENOSCOPY (EGD) WITH PROPOFOL  N/A 03/07/2024   Procedure: ESOPHAGOGASTRODUODENOSCOPY (EGD) WITH PROPOFOL ;  Surgeon: Maryruth Ole ONEIDA, MD;  Location: [facility] ENDOSCOPY;  Service: Endoscopy;  Laterality: N/A;   INGUINAL HERNIA REPAIR     age 89's   POLYPECTOMY      Family History  Problem Relation Age of Onset   Cancer Brother    Breast cancer Paternal Aunt     Social History:  reports that she quit smoking about 16 years ago. Her smoking use included cigarettes. She has been exposed to tobacco smoke. She has never used smokeless tobacco. She  reports that she does not currently use alcohol. She reports that she does not use drugs.  Allergies:  Allergies  Allergen Reactions   Aspirin Nausea And Vomiting    Cannot tolerate unless enteric coated   Atorvastatin Other (See Comments)    Bone pain    Medications reviewed.    ROS Full ROS performed and is otherwise negative other than what is stated in HPI   BP 138/77   Pulse 90   Temp 98.1 F (36.7 C) (Oral)   Ht 5' 4 (1.626  m)   Wt 135 lb (61.2 kg)   SpO2 98%   BMI 23.17 kg/m   Physical Exam Vitals and nursing note reviewed. Exam conducted with a chaperone present.  Constitutional:      General: She is not in acute distress.    Appearance: Normal appearance. She is not ill-appearing or toxic-appearing.  Pulmonary:     Effort: Pulmonary effort is normal.     Breath sounds: No stridor.  Abdominal:     General: Abdomen is flat. There is no distension.     Palpations: Abdomen is soft. There is no mass.     Tenderness: There is no abdominal tenderness. There is no guarding or rebound.     Hernia: No hernia is present.  Musculoskeletal:        General: No swelling or tenderness. Normal range of motion.     Cervical back: Normal range of motion and neck supple. No rigidity or tenderness.  Lymphadenopathy:     Cervical: No cervical adenopathy.  Skin:    General: Skin is warm and dry.     Capillary Refill: Capillary refill takes less than 2 seconds.     Comments: 8 x 10 cm Right upper arm sub q mass c/w lipoma  Neurological:     General: No focal deficit present.     Mental Status: She is alert and oriented to person, place, and time.  Psychiatric:        Mood and Affect: Mood normal.        Behavior: Behavior normal.        Thought Content: Thought content normal.    Assessment/Plan: Lipoma of right upper arm, given its size I do think it is prudent to obtain images. D/W the pt in detail about indication for exicison given its size and enlargement. She is in agreement, We will wait for CT scan for operative planning. D/W her options of doing it in the OR vs doing it here in the office under local. We will await further w/u. I personally spent a total of 40 minutes in the care of the patient today including performing a medically appropriate exam/evaluation, counseling and educating, placing orders, referring and communicating with other health care professionals, documenting clinical information in the  EHR, independently interpreting and reviewing images studies and coordinating care.   Laneta Luna, MD [facility] General Surgeon

## 2024-08-08 NOTE — Patient Instructions (Signed)
 We have scheduled you for a CT Scan of your Abdomen and Pelvis with contrast. This has been scheduled at [facility] on 08/12/2024 . Please arrive there by 1:45 pm . If you need to reschedule your Scan, you may do so by calling [phone]. Please let us  know if you reschedule your scan as we have to get authorization from your insurance for this.    Lipoma   A lipoma is a noncancerous (benign) tumor that is made up of fat cells. This is a very common type of soft-tissue growth. Lipomas are usually found under the skin (subcutaneous). They may occur in any tissue of the body that contains fat. Common areas for lipomas to appear include the back, arms, shoulders, buttocks, and thighs. Lipomas grow slowly, and they are usually painless. Most lipomas do not cause problems and do not require treatment. What are the causes? The cause of this condition is not known. What increases the risk? You are more likely to develop this condition if: You are 51-25 years old. You have a family history of lipomas. What are the signs or symptoms? A lipoma usually appears as a small, round bump under the skin. In most cases, the lump will: Feel soft or rubbery. Not cause pain or other symptoms. However, if a lipoma is located in an area where it pushes on nerves, it can become painful or cause other symptoms. How is this diagnosed? A lipoma can usually be diagnosed with a physical exam. You may also have tests to confirm the diagnosis and to rule out other conditions. Tests may include: Imaging tests, such as a CT scan or an MRI. Removal of a tissue sample to be looked at under a microscope (biopsy). How is this treated? Treatment for this condition depends on the size of the lipoma and whether it is causing any symptoms. For small lipomas that are not causing problems, no treatment is needed. If a lipoma is bigger or it causes problems, surgery may be done to remove the lipoma. Lipomas can also be  removed to improve appearance. Most often, the procedure is done after applying a medicine that numbs the area (local anesthetic). Liposuction may be done to reduce the size of the lipoma before it is removed through surgery, or it may be done to remove the lipoma. Lipomas are removed with this method to limit incision size and scarring. A liposuction tube is inserted through a small incision into the lipoma, and the contents of the lipoma are removed through the tube with suction. Follow these instructions at home: Watch your lipoma for any changes. Keep all follow-up visits. This is important. Where to find more information OrthoInfo: orthoinfo.aaos.org Contact a health care provider if: Your lipoma becomes larger or hard. Your lipoma becomes painful, red, or increasingly swollen. These could be signs of infection or a more serious condition. Get help right away if: You develop tingling or numbness in an area near the lipoma. This could indicate that the lipoma is causing nerve damage. Summary A lipoma is a noncancerous tumor that is made up of fat cells. Most lipomas do not cause problems and do not require treatment. If a lipoma is bigger or it causes problems, surgery may be done to remove the lipoma. Contact a health care provider if your lipoma becomes larger or hard, or if it becomes painful, red, or increasingly swollen. These could be signs of infection or a more serious condition. This information is not intended to replace  advice given to you by your health care provider. Make sure you discuss any questions you have with your health care provider. Document Revised: 01/03/2022 Document Reviewed: 01/03/2022 Elsevier Patient Education  2024 [employer].

## 2024-08-10 DIAGNOSIS — M48062 Spinal stenosis, lumbar region with neurogenic claudication: Secondary | ICD-10-CM | POA: Diagnosis not present

## 2024-08-10 DIAGNOSIS — M5126 Other intervertebral disc displacement, lumbar region: Secondary | ICD-10-CM | POA: Diagnosis not present

## 2024-08-10 DIAGNOSIS — M5416 Radiculopathy, lumbar region: Secondary | ICD-10-CM | POA: Diagnosis not present

## 2024-08-10 DIAGNOSIS — M25561 Pain in right knee: Secondary | ICD-10-CM | POA: Diagnosis not present

## 2024-08-10 DIAGNOSIS — G8929 Other chronic pain: Secondary | ICD-10-CM | POA: Diagnosis not present

## 2024-08-12 ENCOUNTER — Ambulatory Visit
Admission: RE | Admit: 2024-08-12 | Discharge: 2024-08-12 | Disposition: A | Discharge status: Home / Self Care | Source: Ambulatory Visit | Attending: Surgery | Admitting: Surgery

## 2024-08-12 DIAGNOSIS — D1721 Benign lipomatous neoplasm of skin and subcutaneous tissue of right arm: Secondary | ICD-10-CM | POA: Insufficient documentation

## 2024-08-12 MED ORDER — IOHEXOL 300 MG/ML  SOLN
100.0000 mL | Freq: Once | INTRAMUSCULAR | Status: AC | PRN
  Administered 2024-08-12: 100 mL via INTRAVENOUS

## 2024-08-15 ENCOUNTER — Ambulatory Visit (INDEPENDENT_AMBULATORY_CARE_PROVIDER_SITE_OTHER): Admitting: Surgery

## 2024-08-15 ENCOUNTER — Encounter: Payer: Self-pay | Admitting: Surgery

## 2024-08-15 VITALS — BP 130/86 | HR 92 | Ht 64.0 in | Wt 135.0 lb

## 2024-08-15 DIAGNOSIS — D1721 Benign lipomatous neoplasm of skin and subcutaneous tissue of right arm: Secondary | ICD-10-CM | POA: Diagnosis not present

## 2024-08-15 DIAGNOSIS — Z09 Encounter for follow-up examination after completed treatment for conditions other than malignant neoplasm: Secondary | ICD-10-CM

## 2024-08-15 NOTE — Patient Instructions (Signed)
 Please call the office back when you are ready to schedule your follow up with Dr Jordis

## 2024-08-17 NOTE — Progress Notes (Signed)
 Outpatient Surgical Follow Up   Virginia Warner is an 78 y.o. female.   Chief Complaint  Patient presents with   Follow-up    lipoma    HPI: Virginia Warner is following for a Right arm soft tissue mass. 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)    Osteoporosis    Personal history of radiation therapy    Polyp of colon    Pre-diabetes    Renal calculi    Seasonal allergies     Past Surgical History:  Procedure Laterality Date   ABDOMINAL HYSTERECTOMY     partial   ABDOMINAL HYSTERECTOMY W/ PARTIAL VAGINACTOMY N/A    BREAST BIOPSY Left 06/12/2020   US  Bx, Ribbon Clip, [facility]   BREAST LUMPECTOMY Left 07/04/2020   [facility] lumpectomy with radiation   BREAST LUMPECTOMY,RADIO FREQ LOCALIZER,AXILLARY SENTINEL LYMPH NODE BIOPSY Left 07/10/2020   Procedure: BREAST LUMPECTOMY,RADIO FREQ LOCALIZER,AXILLARY SENTINEL LYMPH NODE BIOPSY;  Surgeon: Jordis Laneta FALCON, MD;  Location: [facility] ORS;  Service: General;  Laterality: Left;   COLONOSCOPY WITH PROPOFOL  N/A 10/18/2015   Procedure: COLONOSCOPY WITH PROPOFOL ;  Surgeon: Lamar ONEIDA Holmes, MD;  Location: [facility] ENDOSCOPY;  Service: Endoscopy;  Laterality: N/A;   COLONOSCOPY WITH PROPOFOL  N/A 08/19/2021   Procedure: COLONOSCOPY WITH PROPOFOL ;  Surgeon: Maryruth Ole ONEIDA, MD;  Location: [facility] ENDOSCOPY;  Service: Endoscopy;  Laterality: N/A;    ESOPHAGOGASTRODUODENOSCOPY N/A 04/04/2024   Procedure: EGD (ESOPHAGOGASTRODUODENOSCOPY);  Surgeon: Maryruth Ole ONEIDA, MD;  Location: [facility] ENDOSCOPY;  Service: Endoscopy;  Laterality: N/A;   ESOPHAGOGASTRODUODENOSCOPY N/A 05/03/2024   Procedure: EGD (ESOPHAGOGASTRODUODENOSCOPY);  Surgeon: Maryruth Ole ONEIDA, MD;  Location: [facility] ENDOSCOPY;  Service: Endoscopy;  Laterality: N/A;   ESOPHAGOGASTRODUODENOSCOPY (EGD) WITH PROPOFOL  N/A 03/07/2024   Procedure: ESOPHAGOGASTRODUODENOSCOPY (EGD) WITH PROPOFOL ;  Surgeon: Maryruth Ole ONEIDA, MD;  Location: [facility] ENDOSCOPY;  Service: Endoscopy;  Laterality: N/A;   INGUINAL HERNIA REPAIR     age 48's   POLYPECTOMY      Family History  Problem Relation Age of Onset   Cancer Brother    Breast cancer Paternal Aunt     Social History:  reports that she quit smoking about 16 years ago. Her smoking use included cigarettes. She has been exposed to tobacco smoke. She has never used smokeless tobacco. She reports that she does not currently use alcohol. She reports that she does not use drugs.  Allergies:  Allergies  Allergen Reactions   Aspirin Nausea And Vomiting    Cannot tolerate unless enteric coated   Atorvastatin Other (See Comments)    Bone pain    Medications reviewed.    ROS Full ROS performed and is otherwise negative other than what is stated in HPI   BP 130/86   Pulse 92   Ht 5' 4 (  1.626 m)   Wt 135 lb (61.2 kg)   SpO2 99%   BMI 23.17 kg/m   Physical Exam  Expand All Collapse All  Outpatient Surgical Follow Up   08/08/2024   Virginia Warner is an 78 y.o. female.        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  ER strongly positive greater than 90%, PR 1 to 10% positive and HER-2 negative.  Received adjuvant radiation.  She had a  recent mammogram that have personally reviewed showing no evidence of any suspicious lesions.  She is tolerating letrozole . No fevers, no chills no new lumps. She is tolerating letrozole . Has no major complaints regarding her breast.  Lumps or bumps.  Denies any discharge. Recent CBC and CMP nml Today comes for an increase in size of Right arm soft tissue mass, she has had this for years but now is more noticeable. 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)     Osteoporosis     Personal history of radiation therapy     Polyp of colon     Pre-diabetes     Renal calculi     Seasonal allergies                 Past Surgical History:  Procedure Laterality Date   ABDOMINAL HYSTERECTOMY        partial   ABDOMINAL HYSTERECTOMY W/ PARTIAL VAGINACTOMY N/A     BREAST BIOPSY Left 06/12/2020    US  Bx, Ribbon Clip, [facility]   BREAST LUMPECTOMY Left 07/04/2020    [facility] lumpectomy with radiation   BREAST LUMPECTOMY,RADIO FREQ LOCALIZER,AXILLARY SENTINEL LYMPH NODE BIOPSY Left 07/10/2020    Procedure: BREAST LUMPECTOMY,RADIO FREQ LOCALIZER,AXILLARY SENTINEL LYMPH NODE BIOPSY;  Surgeon: Jordis Laneta FALCON, MD;  Location: [facility] ORS;  Service: General;  Laterality: Left;   COLONOSCOPY WITH PROPOFOL  N/A 10/18/2015    Procedure: COLONOSCOPY WITH PROPOFOL ;  Surgeon: Lamar ONEIDA Holmes, MD;  Location: [facility] ENDOSCOPY;  Service: Endoscopy;  Laterality: N/A;   COLONOSCOPY WITH PROPOFOL  N/A 08/19/2021    Procedure:  COLONOSCOPY WITH PROPOFOL ;  Surgeon: Maryruth Ole ONEIDA, MD;  Location: [facility] ENDOSCOPY;  Service: Endoscopy;  Laterality: N/A;   ESOPHAGOGASTRODUODENOSCOPY N/A 04/04/2024    Procedure: EGD (ESOPHAGOGASTRODUODENOSCOPY);  Surgeon: Maryruth Ole ONEIDA, MD;  Location: [facility] ENDOSCOPY;  Service: Endoscopy;  Laterality: N/A;   ESOPHAGOGASTRODUODENOSCOPY N/A 05/03/2024    Procedure: EGD (ESOPHAGOGASTRODUODENOSCOPY);  Surgeon: Maryruth Ole ONEIDA, MD;  Location: [facility] ENDOSCOPY;  Service: Endoscopy;  Laterality: N/A;   ESOPHAGOGASTRODUODENOSCOPY (EGD) WITH PROPOFOL  N/A 03/07/2024    Procedure: ESOPHAGOGASTRODUODENOSCOPY (EGD) WITH PROPOFOL ;  Surgeon: Maryruth Ole DASEN, MD;  Location: [facility] ENDOSCOPY;  Service: Endoscopy;  Laterality: N/A;   INGUINAL HERNIA REPAIR        age 48's   POLYPECTOMY                   Family History  Problem Relation Age of Onset   Cancer Brother     Breast cancer Paternal Aunt            Social History:  reports that she quit smoking about 16 years ago. Her smoking use included cigarettes. She has been exposed to tobacco smoke. She has never used smokeless tobacco. She reports that she does not currently use alcohol. She reports that she does not use drugs.   Allergies:  Allergies       Allergies  Allergen Reactions   Aspirin Nausea And Vomiting      Cannot tolerate unless enteric coated   Atorvastatin Other (See Comments)      Bone pain        Medications reviewed.       ROS Full ROS performed and is otherwise negative other than what is stated in HPI     BP 138/77   Pulse 90   Temp 98.1 F (36.7 C) (Oral)   Ht 5' 4 (1.626 m)   Wt 135 lb (61.2 kg)   SpO2 98%   BMI 23.17 kg/m    Physical Exam Vitals and nursing note reviewed. Exam conducted with a chaperone present.  Constitutional:      General: She is not in acute distress.    Appearance: Normal appearance. She is not ill-appearing or toxic-appearing.  Musculoskeletal:        General: No  swelling or tenderness. Normal range of motion.     Cervical back: Normal range of motion and neck supple. No rigidity or tenderness.  Skin:    General: Skin is warm and dry.     Capillary Refill: Capillary refill takes less than 2 seconds.     Comments: 8 x 10 cm Right upper arm sub q mass c/w lipoma  Neurological:     General: No focal deficit present.     Mental Status: She is alert and oriented to person, place, and time.  Psychiatric:        Mood and Affect: Mood normal.        Behavior: Behavior normal.        Thought Content: Thought content normal.      Assessment/Plan: Lipoma of right upper arm, given its size and symptoms I do think there is an indication fro removal. No evidence of malignant features on Ct. D/W the pt in detail about indication for exicison given its size and enlargement. She is in agreement, . D/W her options of doing it in the OR vs doing it here in the office under local.She wishes to think about which route is a better fit for her and called us  back. We talked about the R, B and possible complications of mass excision/ I personally spent a total of 30 minutes in the care of the patient today including performing a medically appropriate exam/evaluation, counseling and educating, placing orders, referring and communicating with other health care professionals, documenting clinical information in the EHR, independently interpreting and reviewing images studies and coordinating care.   Laneta Luna, MD [facility] General Surgeon

## 2024-08-23 DIAGNOSIS — M1711 Unilateral primary osteoarthritis, right knee: Secondary | ICD-10-CM | POA: Diagnosis not present

## 2024-08-23 DIAGNOSIS — G8929 Other chronic pain: Secondary | ICD-10-CM | POA: Diagnosis not present

## 2024-08-23 DIAGNOSIS — M25561 Pain in right knee: Secondary | ICD-10-CM | POA: Diagnosis not present

## 2024-08-23 NOTE — Progress Notes (Signed)
 History of Present Illness: Virginia Warner is a 78 y.o.female who is being reevaluated for her right knee discomfort.  Patient has previously seen Thom Geralds in the past for this issue, where she was formally diagnosed with osteoarthritis of her right knee.  She states that she has tried various interventions in the past including oral medications with Tylenol , meloxicam  as well as topicals including Vicks rub, Voltaren cream as well as using various braces and icing.  The patient reports that the Tylenol  and Vicks rub seems to help most with her symptoms.  She is unable to tolerate oral NSAIDs secondary to increased BP that she noticed with taking meloxicam  and ibuprofen.  She does report noticeable intermittent swelling that seems to wax and wane depending on her activity level.  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  She states that her pain is made worse with prolonged standing and ambulation.  He denies any locking or maltracking symptoms.  Does feel that it grinds and pops often however.  She is not using any ambulatory aids.  She is intrigued about getting viscosupplementation injections as she has heard of these been very beneficial for other people.  Current Outpatient Medications  Medication Sig Dispense Refill  . alendronate  (FOSAMAX ) 70 MG tablet Take 70 mg by mouth every 7 (seven) days    . amLODIPine  (NORVASC ) 5 MG tablet Take 5 mg by mouth once daily.    SABRA aspirin 81 MG EC tablet Take 81 mg by mouth every other day    . busPIRone  (BUSPAR ) 5 MG tablet Take 5 mg by mouth once daily    . diltiazem  (CARDIZEM  CD) 120 MG XR capsule Take 120 mg by mouth once daily    . fluticasone  propionate (FLONASE ) 50 mcg/actuation nasal spray Place 2 sprays into both nostrils once daily as needed    . gabapentin  (NEURONTIN ) 100 MG capsule Take 1 capsule (100 mg total) by mouth as directed for up to 90 days Take 100 mg in the morning, 100 mg in the afternoon  and 300 mg at night (Patient taking differently: Take 100 mg by mouth as directed Take 100 mg in the morning, 100 mg in the afternoon and 200  mg at night) 90 capsule 2  . latanoprost  (XALATAN ) 0.005 % ophthalmic solution Place 1 drop into both eyes at bedtime    . letrozole  (FEMARA ) 2.5 mg tablet Take 2.5 mg by mouth once daily    . lisinopriL  (ZESTRIL ) 20 MG tablet Take 20 mg by mouth once daily    . losartan -hydrochlorothiazide  (HYZAAR) 50-12.5 mg tablet Take 1 tablet by mouth once daily.    . meloxicam  (MOBIC ) 15 MG tablet TAKE 1 TABLET BY MOUTH DAILY 30 tablet 3  . methocarbamoL (ROBAXIN) 500 MG tablet 1/2-1 po qHS prn 30 tablet 0  . omeprazole  (PRILOSEC) 20 MG DR capsule Take 1 capsule by mouth once daily    . rosuvastatin  (CRESTOR ) 5 MG tablet Take 5 mg by mouth once daily    . traMADoL (ULTRAM) 50 mg tablet Take 1 tablet (50 mg total) by mouth 2 (two) times daily as needed 1 po bid prn 14 tablet 0   No current facility-administered medications for this visit.   Allergies  Allergen Reactions  . Aspirin Other (See Comments)    Cannot tolerate unless enteric coated  . Atorvastatin Muscle Pain   Past Medical History:  Diagnosis Date  . Adjustment insomnia 05/11/2020  .  Age-related osteoporosis without current pathological fracture 06/17/2020  . Dysuria 03/04/2020  . Encounter for general adult medical examination with abnormal findings 12/27/2018  . Essential hypertension 02/04/2008  . Gastroesophageal reflux disease without esophagitis 09/22/2019  . Hematuria 05/11/2020  . HLD (hyperlipidemia)   . Hypertension   . Hypokalemia 01/19/2021  . Impaired fasting glucose 01/19/2021  . Lymphadenopathy 12/24/2018  . 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 breast (CMS/HHS-HCC) 06/19/2020  . Mixed hyperlipidemia 01/18/2018  . Other seasonal allergic rhinitis 04/11/2013  . 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 Personal history of other diseases of urinary  system 04/11/2013  . Polyp of colon 11/10/2014  . Pure hypercholesterolemia 02/04/2008  . Renal calculi 06/17/2020  . Urinary tract infection, site not specified 08/09/2013   Past Surgical History:  Procedure Laterality Date  . COLONOSCOPY  12/08/2011   Adenomatous Polyp: [facility] 11/2013; 2nd Recall Letter mailed 06/01/2015 (dw)  . COLONOSCOPY  10/18/2015   Adenomatous Polyps: [facility] 09/2020  . COLONOSCOPY  08/19/2021   Diverticulosis/PHx CP/No repeat due to age/CTL  . EGD @ [facility]  03/07/2024   Obstructing Schatzki ring/Repeat upper endoscopy in 1 month for retreatment/Repeat EGD already scheduled/CTL  . EGD @ [facility]  04/04/2024   Moderate Schatzki ring/ repeat 1 month for treatment/ CTL  . EGD @ [facility]   05/03/2024   Non-Obstructing Sckatzki ring/ repeat PRN/ CTL  . ABDOMINAL HYSTERECTOMY W/ PARTIAL VAGINACTOMY      Family History  Problem Relation Name Age of Onset  . No Known Problems Mother    . No Known Problems Father      Social History   Socioeconomic History  . Marital status: Divorced  Tobacco Use  . Smoking status: Never  . Smokeless tobacco: Never  Vaping Use  . Vaping status: Never Used  Substance and Sexual Activity  . Alcohol use: Defer  . Drug use: Never  . Sexual activity: Not Currently    Review of Systems:  A comprehensive 14 point ROS was performed, reviewed, and the pertinent orthopaedic findings are documented in the HPI.  Physical Exam: There were no vitals filed for this visit. General/Constitutional: The patient appears to be well-nourished, well-developed, and in no acute distress. Neuro/Psych: Normal mood and affect, oriented to person, place and time. Eyes: Non-icteric.  Pupils are equal, round, and reactive to light, and exhibit synchronous movement. Lymphatic: No palpable adenopathy. Respiratory: Normal chest excursion and Non-labored breathing Cardiovascular: No edema, swelling or tenderness, except as noted in detailed exam. Vascular: No edema,  swelling or tenderness, except as noted in detailed exam. Integumentary: No impressive skin lesions present, except as noted in detailed exam. Musculoskeletal: See right knee exam below  Right knee exam: GAIT: normal and uses no assistive devices. ALIGNMENT: normal SKIN: unremarkable SWELLING: minimal EFFUSION: absent WARMTH: no warmth TENDERNESS: mild over the medial joint line ROM: Full extension/greater than 110 degrees flexion with some mild increase in discomfort McMURRAY'S: negative PATELLOFEMORAL: normal tracking with no peri-patellar tenderness and negative apprehension sign CREPITUS: yes LACHMAN'S: negative PIVOT SHIFT: negative ANTERIOR DRAWER: negative POSTERIOR DRAWER: negative VARUS/VALGUS: stable  NEUROLOGICAL EXAM: unremarkable STRENGTH: normal and equal bilaterally VASCULAR EXAM: 1-2+ posterior tibial pulse and normal   Knee Imaging: A series of images were ordered and interpreted of the patient's right knee.  Films include AP, lateral and sunrise views. These images demonstrate moderate degenerative changes, primarily involving the medial compartment with 80% Medial joint space narrowing. Subchondral sclerosis noted, osteophyte formation is present. Overall  alignment is neutral.  No fractures, lytic lesions, or abnormal calcifications are noted.  Knee Imaging, external: None ordered today   Assessment:  Encounter Diagnoses  Name Primary?  . Chronic pain of right knee   . Primary osteoarthritis of right knee Yes    Plan: The treatment options were discussed with the patient.   X-rays were taken of the patients right knee at today's visit. The images were reviewed and findings were discussed with the patient. Patient unable to tolerate oral NSAIDs Patient was encouraged to utilize her various topicals that have been beneficial for her in the past.  Discussed Voltaren being a good option, patient states that she has already tried this and will continue with  her Vicks rub which has been most beneficial Patient can take tylenol  for PRN pain. Discussed taking up to 3000 mg a day, not exceeding 1000 mg with one dosage. Patient urged to use ice Discussed bracing is being beneficial Discussed injections as being another alternative, patient did not wish to have any corticosteroid injections, but states that she has heard about the viscosupplementation being very beneficial and would like to order these.  Viscosupplementation order has been placed. Patient will follow up with viscosupplementation authorization approval   I personally spent a total of 35 minutes reviewing the patient's chart, interpreting labs/imaging studies, interaction with, and development of a treatment plan during this patient's visit.  Fonda Dallas Koyanagi, [place]  Fonda Koyanagi, PA-C [facility] 08/23/2024 There are other unrelated non-urgent complaints, but due to the busy schedule and the amount of time I've already spent with her, time does not permit me to address these routine issues at today's visit. I've requested another appointment to review these additional issues.

## 2024-09-07 ENCOUNTER — Ambulatory Visit (INDEPENDENT_AMBULATORY_CARE_PROVIDER_SITE_OTHER): Admitting: Surgery

## 2024-09-07 ENCOUNTER — Encounter: Payer: Self-pay | Admitting: Surgery

## 2024-09-07 ENCOUNTER — Telehealth: Payer: Self-pay | Admitting: Surgery

## 2024-09-07 VITALS — BP 137/90 | HR 78 | Ht 64.0 in | Wt 133.0 lb

## 2024-09-07 DIAGNOSIS — D1721 Benign lipomatous neoplasm of skin and subcutaneous tissue of right arm: Secondary | ICD-10-CM

## 2024-09-07 NOTE — Telephone Encounter (Signed)
 Patient has been advised of Pre-Admission date/time, and Surgery date at [facility].  Surgery Date: 09/22/24 Preadmission Testing Date: 09/15/24 (phone 8a-1p)  Patient informed of the scheduling process and surgery information given at time of office visit.  Patient has been made aware to call [phone], between 1-3:00pm the day before surgery, to find out what time to arrive for surgery.

## 2024-09-07 NOTE — Patient Instructions (Signed)
 We have spoken today about removing a Lipoma. This will be done by Dr. Jordis at [facility].  You will most likely be able to leave the hospital several hours after your surgery. You may need to have a drain placed after surgery, this depends on the size of your lipoma.  Plan to tentatively be off work for up to1 week following the surgery.  Please see your Blue surgery sheet for more information. Our surgery scheduler will call you to look at surgery dates and to go over information.   If you have FMLA or Disability paperwork that needs to be filled out, please have your company fax your paperwork to [phone] or you may drop this by either office. This paperwork will be filled out within 3 days after your surgery has been completed.  Lipoma Removal  Lipoma removal is a surgical procedure to remove a lipoma, which is a noncancerous (benign) tumor that is made up of fat cells. Most lipomas are small and painless and do not require treatment. They can form in many areas of the body but are most common under the skin of the back, arms, shoulders, buttocks, and thighs. You may need lipoma removal if you have a lipoma that is large, growing, or causing discomfort. Lipoma removal may also be done for cosmetic reasons. Tell a health care provider about: Any allergies you have. All medicines you are taking, including vitamins, herbs, eye drops, creams, and over-the-counter medicines. Any problems you or family members have had with anesthetic medicines. Any bleeding problems you have. Any surgeries you have had. Any medical conditions you have. Whether you are pregnant or may be pregnant. What are the risks? Generally, this is a safe procedure. However, problems may occur, including: Infection. Bleeding. Scarring. Allergic reactions to medicines. Damage to nearby structures or organs, such as damage to nerves or blood vessels near the lipoma. What happens before the  procedure? Medicines Ask your health care provider about: Changing or stopping your regular medicines. This is especially important if you are taking diabetes medicines or blood thinners. Taking medicines such as aspirin and ibuprofen. These medicines can thin your blood. Do not take these medicines unless your health care provider tells you to take them. Taking over-the-counter medicines, vitamins, herbs, and supplements. General instructions You will have a physical exam. Your health care provider will check the size of the lipoma and whether it can be removed easily. You may have a biopsy and imaging tests, such as X-rays, a CT scan, and an MRI. Do not use any products that contain nicotine or tobacco for at least 4 weeks before the procedure. These products include cigarettes, chewing tobacco, and vaping devices, such as e-cigarettes. If you need help quitting, ask your health care provider. Ask your health care provider: How your surgery site will be marked. What steps will be taken to help prevent infection. These may include: Washing skin with a germ-killing soap. Taking antibiotic medicine. If you will be going home right after the procedure, plan to have a responsible adult: Take you home from the hospital or clinic. You will not be allowed to drive. Care for you for the time you are told. What happens during the procedure?  An IV will be inserted into one of your veins. You will be given one or more of the following: A medicine to help you relax (sedative). A medicine to numb the area (local anesthetic). A medicine to make you fall asleep (general anesthetic). A medicine  that is injected into an area of your body to numb everything below the injection site (regional anesthetic). An incision will be made into the skin over the lipoma or very near the lipoma. The incision may be made in a natural skin line or crease. Tissues, nerves, and blood vessels near the lipoma will be moved  out of the way. The lipoma and the capsule that surrounds it will be separated from the surrounding tissues. The lipoma will be removed. You may have a drain placed depending on the size of your lipoma The incision may be closed with stitches (sutures). A bandage (dressing) will be placed over the incision. The procedure may vary among health care providers and hospitals. What happens after the procedure? Your blood pressure, heart rate, breathing rate, and blood oxygen level will be monitored until you leave the hospital or clinic. If you were prescribed an antibiotic medicine, use it as told by your health care provider. Do not stop using the antibiotic even if you start to feel better. If you were given a sedative during the procedure, it can affect you for several hours. Do not drive or operate machinery until your health care provider says that it is safe. Where to find more information OrthoInfo: orthoinfo.aaos.org Summary Before the procedure, follow instructions from your health care provider about eating and drinking, and changing or stopping your regular medicines. This is especially important if you are taking diabetes medicines or blood thinners. After the lipoma is removed, the incision may be closed with stitches (sutures) and covered with a bandage (dressing). If you were given a sedative during the procedure, it can affect you for several hours. Do not drive or operate machinery until your health care provider says that it is safe.

## 2024-09-08 NOTE — H&P (View-Only) (Signed)
 Surgical Consultation    Virginia Warner is an 78 y.o. female.   Chief Complaint  Patient presents with   Follow-up    HPI:  Virginia Warner is following for a Right arm soft tissue mass. She still wishes to have this removed as is creating some discomfort , she also reports that it has increased in size and she feels more comfortable having this mass removed. 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)    Osteoporosis    Personal history of radiation therapy    Polyp of colon    Pre-diabetes    Renal calculi    Seasonal allergies     Past Surgical History:  Procedure Laterality Date   ABDOMINAL HYSTERECTOMY     partial   ABDOMINAL HYSTERECTOMY W/ PARTIAL VAGINACTOMY N/A    BREAST BIOPSY Left 06/12/2020   US  Bx, Ribbon Clip, [facility]   BREAST LUMPECTOMY Left 07/04/2020   [facility] lumpectomy with radiation   BREAST LUMPECTOMY,RADIO FREQ LOCALIZER,AXILLARY SENTINEL LYMPH NODE BIOPSY Left 07/10/2020   Procedure: BREAST LUMPECTOMY,RADIO FREQ LOCALIZER,AXILLARY SENTINEL LYMPH NODE BIOPSY;  Surgeon: Jordis Laneta FALCON, MD;  Location: [facility] ORS;  Service: General;  Laterality: Left;   COLONOSCOPY WITH PROPOFOL  N/A 10/18/2015   Procedure: COLONOSCOPY WITH PROPOFOL ;  Surgeon: Lamar ONEIDA Holmes, MD;  Location: [facility] ENDOSCOPY;  Service: Endoscopy;  Laterality: N/A;   COLONOSCOPY WITH PROPOFOL  N/A 08/19/2021   Procedure: COLONOSCOPY WITH PROPOFOL ;  Surgeon:  Maryruth Ole ONEIDA, MD;  Location: [facility] ENDOSCOPY;  Service: Endoscopy;  Laterality: N/A;   ESOPHAGOGASTRODUODENOSCOPY N/A 04/04/2024   Procedure: EGD (ESOPHAGOGASTRODUODENOSCOPY);  Surgeon: Maryruth Ole ONEIDA, MD;  Location: [facility] ENDOSCOPY;  Service: Endoscopy;  Laterality: N/A;   ESOPHAGOGASTRODUODENOSCOPY N/A 05/03/2024   Procedure: EGD (ESOPHAGOGASTRODUODENOSCOPY);  Surgeon: Maryruth Ole ONEIDA, MD;  Location: [facility] ENDOSCOPY;  Service: Endoscopy;  Laterality: N/A;   ESOPHAGOGASTRODUODENOSCOPY (EGD) WITH PROPOFOL  N/A 03/07/2024   Procedure: ESOPHAGOGASTRODUODENOSCOPY (EGD) WITH PROPOFOL ;  Surgeon: Maryruth Ole ONEIDA, MD;  Location: [facility] ENDOSCOPY;  Service: Endoscopy;  Laterality: N/A;   INGUINAL HERNIA REPAIR     age 68's   POLYPECTOMY      Family History  Problem Relation Age of Onset   Cancer Brother    Breast cancer Paternal Aunt     Social History:  reports that she quit smoking about 16 years ago. Her smoking use included cigarettes. She has been exposed to tobacco smoke. She has never used smokeless tobacco. She reports that she does not currently use alcohol. She reports that she does not use drugs.  Allergies:  Allergies  Allergen Reactions   Aspirin Nausea And Vomiting    Cannot tolerate unless enteric coated   Atorvastatin Other (See Comments)    Bone pain    Medications reviewed.     ROS Full ROS performed and is otherwise negative other than what  is stated in the HPI    BP (!) 137/90   Pulse 78   Ht 5' 4 (1.626 m)   Wt 133 lb (60.3 kg)   SpO2 97%   BMI 22.83 kg/m   Physical Exam Vitals and nursing note reviewed. Exam conducted with a chaperone present.  Constitutional:      Appearance: Normal appearance.  Cardiovascular:     Rate and Rhythm: Normal rate and regular rhythm.  Pulmonary:     Effort: Pulmonary effort is normal. No respiratory distress.     Breath sounds: Normal breath sounds. No stridor. No wheezing or rhonchi.  Abdominal:      General: Abdomen is flat. There is no distension.     Palpations: Abdomen is soft. There is no mass.     Tenderness: There is no abdominal tenderness. There is no guarding.     Hernia: No hernia is present.  Skin:    General: Skin is warm.     Capillary Refill: Capillary refill takes less than 2 seconds.     Comments: 8 x 10 cm Right upper arm sub q mass c/w lipoma   Neurological:     General: No focal deficit present.     Mental Status: She is alert and oriented to person, place, and time.  Psychiatric:        Mood and Affect: Mood normal.        Behavior: Behavior normal.        Thought Content: Thought content normal.        Judgment: Judgment normal.     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  Discussed with her in detail about options of excision here in the office versus excision in the OR.  She does have significant anxiety and would rather have this excised in the OR.  Discussed with her in detail.  There is the benefits and the possible complications including but not limited to: Bleeding, infection, chronic pain and deformity.  She understands and wished to proceed I personally spent a total of 30 minutes in the care of the patient today including performing a medically appropriate exam/evaluation, counseling and educating, placing orders, referring and communicating with other health care professionals, documenting clinical information in the EHR, independently interpreting and reviewing images studies and coordinating care.      Laneta Luna, MD [facility] General Surgeon

## 2024-09-08 NOTE — Progress Notes (Signed)
 Surgical Consultation    Virginia Warner is an 78 y.o. female.   Chief Complaint  Patient presents with   Follow-up    HPI:  Virginia Warner is following for a Right arm soft tissue mass. She still wishes to have this removed as is creating some discomfort , she also reports that it has increased in size and she feels more comfortable having this mass removed. 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)    Osteoporosis    Personal history of radiation therapy    Polyp of colon    Pre-diabetes    Renal calculi    Seasonal allergies     Past Surgical History:  Procedure Laterality Date   ABDOMINAL HYSTERECTOMY     partial   ABDOMINAL HYSTERECTOMY W/ PARTIAL VAGINACTOMY N/A    BREAST BIOPSY Left 06/12/2020   US  Bx, Ribbon Clip, [facility]   BREAST LUMPECTOMY Left 07/04/2020   [facility] lumpectomy with radiation   BREAST LUMPECTOMY,RADIO FREQ LOCALIZER,AXILLARY SENTINEL LYMPH NODE BIOPSY Left 07/10/2020   Procedure: BREAST LUMPECTOMY,RADIO FREQ LOCALIZER,AXILLARY SENTINEL LYMPH NODE BIOPSY;  Surgeon: Virginia Virginia FALCON, MD;  Location: [facility] ORS;  Service: General;  Laterality: Left;   COLONOSCOPY WITH PROPOFOL  N/A 10/18/2015   Procedure: COLONOSCOPY WITH PROPOFOL ;  Surgeon: Virginia Warner Holmes, MD;  Location: [facility] ENDOSCOPY;  Service: Endoscopy;  Laterality: N/A;   COLONOSCOPY WITH PROPOFOL  N/A 08/19/2021   Procedure: COLONOSCOPY WITH PROPOFOL ;  Surgeon:  Virginia Ole ONEIDA, MD;  Location: [facility] ENDOSCOPY;  Service: Endoscopy;  Laterality: N/A;   ESOPHAGOGASTRODUODENOSCOPY N/A 04/04/2024   Procedure: EGD (ESOPHAGOGASTRODUODENOSCOPY);  Surgeon: Virginia Ole ONEIDA, MD;  Location: [facility] ENDOSCOPY;  Service: Endoscopy;  Laterality: N/A;   ESOPHAGOGASTRODUODENOSCOPY N/A 05/03/2024   Procedure: EGD (ESOPHAGOGASTRODUODENOSCOPY);  Surgeon: Virginia Ole ONEIDA, MD;  Location: [facility] ENDOSCOPY;  Service: Endoscopy;  Laterality: N/A;   ESOPHAGOGASTRODUODENOSCOPY (EGD) WITH PROPOFOL  N/A 03/07/2024   Procedure: ESOPHAGOGASTRODUODENOSCOPY (EGD) WITH PROPOFOL ;  Surgeon: Virginia Ole ONEIDA, MD;  Location: [facility] ENDOSCOPY;  Service: Endoscopy;  Laterality: N/A;   INGUINAL HERNIA REPAIR     age 68's   POLYPECTOMY      Family History  Problem Relation Age of Onset   Cancer Brother    Breast cancer Paternal Aunt     Social History:  reports that she quit smoking about 16 years ago. Her smoking use included cigarettes. She has been exposed to tobacco smoke. She has never used smokeless tobacco. She reports that she does not currently use alcohol. She reports that she does not use drugs.  Allergies:  Allergies  Allergen Reactions   Aspirin Nausea And Vomiting    Cannot tolerate unless enteric coated   Atorvastatin Other (See Comments)    Bone pain    Medications reviewed.     ROS Full ROS performed and is otherwise negative other than what  is stated in the HPI    BP (!) 137/90   Pulse 78   Ht 5' 4 (1.626 m)   Wt 133 lb (60.3 kg)   SpO2 97%   BMI 22.83 kg/m   Physical Exam Vitals and nursing note reviewed. Exam conducted with a chaperone present.  Constitutional:      Appearance: Normal appearance.  Cardiovascular:     Rate and Rhythm: Normal rate and regular rhythm.  Pulmonary:     Effort: Pulmonary effort is normal. No respiratory distress.     Breath sounds: Normal breath sounds. No stridor. No wheezing or rhonchi.  Abdominal:      General: Abdomen is flat. There is no distension.     Palpations: Abdomen is soft. There is no mass.     Tenderness: There is no abdominal tenderness. There is no guarding.     Hernia: No hernia is present.  Skin:    General: Skin is warm.     Capillary Refill: Capillary refill takes less than 2 seconds.     Comments: 8 x 10 cm Right upper arm sub q mass c/w lipoma   Neurological:     General: No focal deficit present.     Mental Status: She is alert and oriented to person, place, and time.  Psychiatric:        Mood and Affect: Mood normal.        Behavior: Behavior normal.        Thought Content: Thought content normal.        Judgment: Judgment normal.     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  Discussed with her in detail about options of excision here in the office versus excision in the OR.  She does have significant anxiety and would rather have this excised in the OR.  Discussed with her in detail.  There is the benefits and the possible complications including but not limited to: Bleeding, infection, chronic pain and deformity.  She understands and wished to proceed I personally spent a total of 30 minutes in the care of the patient today including performing a medically appropriate exam/evaluation, counseling and educating, placing orders, referring and communicating with other health care professionals, documenting clinical information in the EHR, independently interpreting and reviewing images studies and coordinating care.      Virginia Luna, MD [facility] General Surgeon

## 2024-09-12 ENCOUNTER — Encounter: Payer: Self-pay | Admitting: Nurse Practitioner

## 2024-09-12 ENCOUNTER — Ambulatory Visit: Admitting: Nurse Practitioner

## 2024-09-12 VITALS — BP 119/75 | HR 80 | Temp 98.0°F | Resp 16 | Ht 64.0 in | Wt 135.4 lb

## 2024-09-12 DIAGNOSIS — M810 Age-related osteoporosis without current pathological fracture: Secondary | ICD-10-CM

## 2024-09-12 DIAGNOSIS — R7303 Prediabetes: Secondary | ICD-10-CM | POA: Diagnosis not present

## 2024-09-12 DIAGNOSIS — F411 Generalized anxiety disorder: Secondary | ICD-10-CM

## 2024-09-12 DIAGNOSIS — D1721 Benign lipomatous neoplasm of skin and subcutaneous tissue of right arm: Secondary | ICD-10-CM

## 2024-09-12 NOTE — Progress Notes (Signed)
 [facility] [address] [place], [place] [address]  Internal MEDICINE  Office Visit Note  Patient Name: Virginia Warner  [id number]  [id number]  Date of Service: 09/12/2024  Chief Complaint  Patient presents with   Gastroesophageal Reflux   Hypertension   Hyperlipidemia   Follow-up    Discuss arm surgery.     HPI [name] presents for a follow-up visit for lipoma on right upper arm.  Has upcoming surgery to remove a large lipoma on the back of her right upper arm.  All her questions were answered and patient is reassured regarding minor surgery and possible risks as previously discussed with her by the surgeon. She has significant amount of anxiety in general but procedures especially surgery make her feel even more anxious.    Current Medication: Outpatient Encounter Medications as of 09/12/2024  Medication Sig   alendronate  (FOSAMAX ) 70 MG tablet TAKE 1 TABLET EVERY 7 DAYS WITH A FULL GLASS OF WATER ON AN EMPTY STOMACH DO NOT LIE DOWN FOR AT LEAST 30 MIN (Patient taking differently: 70 mg once a week. Wednesdays)   aspirin EC 81 MG tablet Take 81 mg by mouth every other day.   diltiazem  (CARDIZEM  CD) 120 MG 24 hr capsule Take 1 capsule (120 mg total) by mouth daily. (Patient taking differently: Take 120 mg by mouth every morning.)   fluticasone  (FLONASE ) 50 MCG/ACT nasal spray SPRAY TWICE INTO EACH NOSTRIL ONCE DAILY (Patient taking differently: 1 spray daily as needed for allergies.)   gabapentin  (NEURONTIN ) 100 MG capsule TAKE ONE CAPSULE BY MOUTH EVERY MORNING,ONE CAPSULE IN THE AFTERNOON, AND 3 CAPSULES AT BEDTIME. (Patient taking differently: 100 mg 3 (three) times daily. TAKE ONE CAPSULE BY MOUTH EVERY MORNING,ONE CAPSULE IN THE AFTERNOON, AND 3 CAPSULES AT BEDTIME.)   latanoprost  (XALATAN ) 0.005 % ophthalmic solution Place 1 drop into both eyes at bedtime.   lisinopril  (ZESTRIL ) 20 MG tablet TAKE ONE TABLET BY MOUTH EVERY MORNING AND ONE IN EVENING (Patient taking  differently: Take 20 mg by mouth 2 (two) times daily. TAKE ONE TABLET BY MOUTH EVERY MORNING AND ONE IN EVENING)   [DISCONTINUED] acetaminophen  (TYLENOL ) 500 MG tablet Take 500 mg by mouth daily as needed for moderate pain.    [DISCONTINUED] amLODipine  (NORVASC ) 5 MG tablet Take 5 mg by mouth every morning.   [DISCONTINUED] busPIRone  (BUSPAR ) 5 MG tablet TAKE 1 TABLET BY MOUTH ONCE DAILY   [DISCONTINUED] letrozole  (FEMARA ) 2.5 MG tablet TAKE 1 TABLET BY MOUTH DAILY   [DISCONTINUED] Multiple Vitamin (MULTIVITAMIN WITH MINERALS) TABS tablet Take 1 tablet by mouth daily.  (Patient not taking: Reported on 09/12/2024)   [DISCONTINUED] omeprazole  (PRILOSEC) 20 MG capsule Take 1 capsule (20 mg total) by mouth daily. (Patient not taking: Reported on 09/12/2024)   [DISCONTINUED] rosuvastatin  (CRESTOR ) 5 MG tablet Take 1 tablet (5 mg total) by mouth daily. (Patient taking differently: Take 5 mg by mouth every other day.)   [DISCONTINUED] Vitamin D , Ergocalciferol , 50000 units CAPS TAKE 1 CAPSULE BY MOUTH ONCE A WEEK (Patient not taking: Reported on 09/12/2024)   No facility-administered encounter medications on file as of 09/12/2024.    Surgical History: Past Surgical History:  Procedure Laterality Date   ABDOMINAL HYSTERECTOMY W/ PARTIAL VAGINACTOMY N/A    BREAST BIOPSY Left 06/12/2020   US  Bx, Ribbon Clip, [facility]   BREAST LUMPECTOMY Left 07/04/2020   [facility] lumpectomy with radiation   BREAST LUMPECTOMY,RADIO FREQ LOCALIZER,AXILLARY SENTINEL LYMPH NODE BIOPSY Left 07/10/2020   Procedure: BREAST LUMPECTOMY,RADIO FREQ  LOCALIZER,AXILLARY SENTINEL LYMPH NODE BIOPSY;  Surgeon: Jordis Laneta FALCON, MD;  Location: [facility] ORS;  Service: General;  Laterality: Left;   COLONOSCOPY WITH PROPOFOL  N/A 10/18/2015   Procedure: COLONOSCOPY WITH PROPOFOL ;  Surgeon: Lamar ONEIDA Holmes, MD;  Location: [facility] ENDOSCOPY;  Service: Endoscopy;  Laterality: N/A;   COLONOSCOPY WITH PROPOFOL  N/A 08/19/2021   Procedure: COLONOSCOPY WITH PROPOFOL ;   Surgeon: Maryruth Ole ONEIDA, MD;  Location: [facility] ENDOSCOPY;  Service: Endoscopy;  Laterality: N/A;   ESOPHAGOGASTRODUODENOSCOPY N/A 04/04/2024   Procedure: EGD (ESOPHAGOGASTRODUODENOSCOPY);  Surgeon: Maryruth Ole ONEIDA, MD;  Location: [facility] ENDOSCOPY;  Service: Endoscopy;  Laterality: N/A;   ESOPHAGOGASTRODUODENOSCOPY N/A 05/03/2024   Procedure: EGD (ESOPHAGOGASTRODUODENOSCOPY);  Surgeon: Maryruth Ole ONEIDA, MD;  Location: [facility] ENDOSCOPY;  Service: Endoscopy;  Laterality: N/A;   ESOPHAGOGASTRODUODENOSCOPY (EGD) WITH PROPOFOL  N/A 03/07/2024   Procedure: ESOPHAGOGASTRODUODENOSCOPY (EGD) WITH PROPOFOL ;  Surgeon: Maryruth Ole ONEIDA, MD;  Location: [facility] ENDOSCOPY;  Service: Endoscopy;  Laterality: N/A;   EXCISION MASS UPPER EXTREMETIES Right 09/22/2024   Procedure: EXCISION MASS UPPER EXTREMITIES;  Surgeon: Jordis Laneta FALCON, MD;  Location: [facility] ORS;  Service: General;  Laterality: Right;   INGUINAL HERNIA REPAIR     age 78's   POLYPECTOMY      Medical History: Past Medical History:  Diagnosis Date   Adjustment insomnia    Allergy    Anxiety    Aortic atherosclerosis    Arthritis    Bacterial vaginitis    Breast cancer (HCC)    Congenital deformities of feet    Corns and callosities    Esophageal stricture    GERD (gastroesophageal reflux disease)    History of kidney stones    Hyperlipidemia    Hypertension    Hypokalemia 01/19/2021   Lipoma of right upper extremity    Lymphadenopathy    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)    Osteoporosis    Personal history of radiation therapy    Polyp of colon    Pre-diabetes    Renal calculi    Seasonal allergies    Tachycardia     Family History: Family History  Problem Relation Age of Onset   Cancer Brother    Breast cancer Paternal Aunt     Social History   Socioeconomic History   Marital status: Divorced    Spouse name: Not on file   Number of children: Not on file   Years of education:  Not on file   Highest education level: Not on file  Occupational History   Not on file  Tobacco Use   Smoking status: Former    Current packs/day: 0.00    Types: Cigarettes    Quit date: 11/2007    Years since quitting: 16.9    Passive exposure: Past   Smokeless tobacco: Never  Vaping Use   Vaping status: Never Used  Substance and Sexual Activity   Alcohol use: Not Currently    Alcohol/week: 0.0 standard drinks of alcohol   Drug use: No   Sexual activity: Not Currently  Other Topics Concern   Not on file  Social History Narrative   Not on file   Social Drivers of Health   Financial Resource Strain: Low Risk  (08/23/2024)   Received from [facility]   Overall Financial Resource Strain (CARDIA)    Difficulty of Paying Living Expenses: Not hard at all  Food Insecurity: No Food Insecurity (08/23/2024)   Received from [facility]   Hunger  Vital Sign    Within the past 12 months, you worried that your food would run out before you got the money to buy more.: Never true    Within the past 12 months, the food you bought just didn't last and you didn't have money to get more.: Never true  Transportation Needs: No Transportation Needs (08/23/2024)   Received from [facility] - Transportation    In the past 12 months, has lack of transportation kept you from medical appointments or from getting medications?: No    Lack of Transportation (Non-Medical): No  Physical Activity: Not on file  Stress: Not on file  Social Connections: Not on file  Intimate Partner Violence: Not on file      Review of Systems  Constitutional:  Negative for appetite change, fatigue and fever.  HENT:  Negative for congestion, mouth sores and postnasal drip.   Respiratory: Negative.  Negative for cough, chest tightness, shortness of breath and wheezing.   Cardiovascular: Negative.  Negative for chest pain and palpitations.  Gastrointestinal:  Negative.   Genitourinary: Negative.  Negative for flank pain.  Musculoskeletal:  Positive for arthralgias.  Skin:        Lipoma on the posterior aspect of the right upper arm.   Neurological: Negative.  Negative for headaches.  Psychiatric/Behavioral:  Negative for behavioral problems, self-injury, sleep disturbance and suicidal ideas. The patient is nervous/anxious.     Vital Signs: BP 119/75   Pulse 80   Temp 98 F (36.7 C)   Resp 16   Ht 5' 4 (1.626 m)   Wt 135 lb 6.4 oz (61.4 kg)   SpO2 96%   BMI 23.24 kg/m    Physical Exam Vitals reviewed.  Constitutional:      General: She is not in acute distress.    Appearance: Normal appearance. She is normal weight. She is not ill-appearing.  HENT:     Head: Normocephalic and atraumatic.  Eyes:     Pupils: Pupils are equal, round, and reactive to light.  Cardiovascular:     Rate and Rhythm: Normal rate and regular rhythm.  Pulmonary:     Effort: Pulmonary effort is normal. No respiratory distress.  Neurological:     Mental Status: She is alert and oriented to person, place, and time.  Psychiatric:        Mood and Affect: Affect normal. Mood is anxious.        Behavior: Behavior normal.        Assessment/Plan: 1. Lipoma of right upper extremity (Primary) Surgery is scheduled, questions answered. No issues   2. Prediabetes A1c is still stable, continue to monitor   3. Age-related osteoporosis without current pathological fracture Continue fosamax  as prescribed.   4. GAD (generalized anxiety disorder) Stable, takes buspirone  daily.    General Counseling: camielle sizer understanding of the findings of todays visit and agrees with plan of treatment. I have discussed any further diagnostic evaluation that may be needed or ordered today. We also reviewed her medications today. she has been encouraged to call the office with any questions or concerns that should arise related to todays visit.    No orders of the  defined types were placed in this encounter.   No orders of the defined types were placed in this encounter.   Return if symptoms worsen or fail to improve, for keep next schedule follow up visit .   Total time spent:30 Minutes Time spent includes review of  chart, medications, test results, and follow up plan with the patient.   [place] Controlled Substance Database was reviewed by me.  This patient was seen by Mardy Maxin, FNP-C in collaboration with Dr. Sigrid Bathe as a part of collaborative care agreement.   Ziv Welchel R. Maxin, MSN, FNP-C Internal medicine

## 2024-09-14 NOTE — Addendum Note (Signed)
 Addended by: JORDIS CLICHE F on: 09/14/2024 03:16 PM   Modules accepted: Orders

## 2024-09-15 ENCOUNTER — Encounter
Admission: RE | Admit: 2024-09-15 | Discharge: 2024-09-15 | Disposition: A | Discharge status: Home / Self Care | Source: Ambulatory Visit | Attending: Surgery | Admitting: Surgery

## 2024-09-15 ENCOUNTER — Other Ambulatory Visit: Payer: Self-pay

## 2024-09-15 DIAGNOSIS — I700 Atherosclerosis of aorta: Secondary | ICD-10-CM

## 2024-09-15 DIAGNOSIS — I10 Essential (primary) hypertension: Secondary | ICD-10-CM

## 2024-09-15 DIAGNOSIS — Z01810 Encounter for preprocedural cardiovascular examination: Secondary | ICD-10-CM

## 2024-09-15 DIAGNOSIS — R000 Tachycardia, unspecified: Secondary | ICD-10-CM

## 2024-09-15 DIAGNOSIS — D1720 Benign lipomatous neoplasm of skin and subcutaneous tissue of unspecified limb: Secondary | ICD-10-CM

## 2024-09-15 HISTORY — DX: Atherosclerosis of aorta: I70.0

## 2024-09-15 HISTORY — DX: Tachycardia, unspecified: R00.0

## 2024-09-15 HISTORY — DX: Benign lipomatous neoplasm of skin and subcutaneous tissue of right arm: D17.21

## 2024-09-15 HISTORY — DX: Anxiety disorder, unspecified: F41.9

## 2024-09-15 NOTE — Patient Instructions (Signed)
 Your procedure is scheduled on:09-22-24 Thursday Report to the Registration Desk on the 1st floor of the Medical Mall.Then proceed to the 2nd floor Surgery Desk To find out your arrival time, please call [phone] between 1PM - 3PM on:09-21-24 Wednesday If your arrival time is 6:00 am, do not arrive before that time as the Medical Mall entrance doors do not open until 6:00 am.  REMEMBER: Instructions that are not followed completely may result in serious medical risk, up to and including death; or upon the discretion of your surgeon and anesthesiologist your surgery may need to be rescheduled.  Do not eat food after midnight the night before surgery.  No gum chewing or hard candies.  You may however, drink CLEAR liquids up to 2 hours before you are scheduled to arrive for your surgery. Do not drink anything within 2 hours of your scheduled arrival time.  Clear liquids include: - water  - apple juice without pulp - gatorade (not RED colors) - black coffee or tea (Do NOT add milk or creamers to the coffee or tea) Do NOT drink anything that is not on this list.  One week prior to surgery:Stop NOW (09-15-24) Stop Anti-inflammatories (NSAIDS) such as Advil, Aleve, Ibuprofen, Motrin, Naproxen, Naprosyn and Aspirin based products such as Excedrin, Goody's Powder, BC Powder. Stop ANY OVER THE COUNTER supplements until after surgery.  You may however, continue to take Tylenol /Tramadol if needed for pain up until the day of surgery.  Continue taking all of your other prescription medications up until the day of surgery.  ON THE DAY OF SURGERY ONLY TAKE THESE MEDICATIONS WITH SIPS OF WATER: -busPIRone  (BUSPAR )  -diltiazem  (CARDIZEM  CD)  -fexofenadine (ALLEGRA)  -gabapentin  (NEURONTIN )   Continue your 81 mg Aspirin up until the day prior to surgery-Do NOT take the day of surgery  No Alcohol for 24 hours before or after surgery.  No Smoking including e-cigarettes for 24 hours before  surgery.  No chewable tobacco products for at least 6 hours before surgery.  No nicotine patches on the day of surgery.  Do not use any recreational drugs for at least a week (preferably 2 weeks) before your surgery.  Please be advised that the combination of cocaine and anesthesia may have negative outcomes, up to and including death. If you test positive for cocaine, your surgery will be cancelled.  On the morning of surgery brush your teeth with toothpaste and water, you may rinse your mouth with mouthwash if you wish. Do not swallow any toothpaste or mouthwash.  Use CHG Soap as directed on instruction sheet.  Do not wear jewelry, make-up, hairpins, clips or nail polish.  For welded (permanent) jewelry: bracelets, anklets, waist bands, etc.  Please have this removed prior to surgery.  If it is not removed, there is a chance that hospital personnel will need to cut it off on the day of surgery.  Do not wear lotions, powders, or perfumes.   Do not shave body hair from the neck down 48 hours before surgery.  Contact lenses, hearing aids and dentures may not be worn into surgery.  Do not bring valuables to the hospital. [facility] is not responsible for any missing/lost belongings or valuables.   Notify your doctor if there is any change in your medical condition (cold, fever, infection).  Wear comfortable clothing (specific to your surgery type) to the hospital.  After surgery, you can help prevent lung complications by doing breathing exercises.  Take deep breaths and cough every  1-2 hours. Your doctor may order a device called an Incentive Spirometer to help you take deep breaths. When coughing or sneezing, hold a pillow firmly against your incision with both hands. This is called "splinting." Doing this helps protect your incision. It also decreases belly discomfort.  If you are being admitted to the hospital overnight, leave your suitcase in the car. After surgery it may be  brought to your room.  In case of increased patient census, it may be necessary for you, the patient, to continue your postoperative care in the Same Day Surgery department.  If you are being discharged the day of surgery, you will not be allowed to drive home. You will need a responsible individual to drive you home and stay with you for 24 hours after surgery.   If you are taking public transportation, you will need to have a responsible individual with you.  Please call the Pre-admissions Testing Dept. at [phone] if you have any questions about these instructions.  Surgery Visitation Policy:  Patients having surgery or a procedure may have two visitors.  Children under the age of 80 must have an adult with them who is not the patient.                                                                                                             Preparing for Surgery with CHLORHEXIDINE  GLUCONATE (CHG) Soap  Chlorhexidine  Gluconate (CHG) Soap  o An antiseptic cleaner that kills germs and bonds with the skin to continue killing germs even after washing  o Used for showering the night before surgery and morning of surgery  Before surgery, you can play an important role by reducing the number of germs on your skin.  CHG (Chlorhexidine  gluconate) soap is an antiseptic cleanser which kills germs and bonds with the skin to continue killing germs even after washing.  Please do not use if you have an allergy to CHG or antibacterial soaps. If your skin becomes reddened/irritated stop using the CHG.  1. Shower the NIGHT BEFORE SURGERY and the MORNING OF SURGERY with CHG soap.  2. If you choose to wash your hair, wash your hair first as usual with your normal shampoo.  3. After shampooing, rinse your hair and body thoroughly to remove the shampoo.  4. Use CHG as you would any other liquid soap. You can apply CHG directly to the skin and wash gently with a scrungie or a clean  washcloth.  5. Apply the CHG soap to your body only from the neck down. Do not use on open wounds or open sores. Avoid contact with your eyes, ears, mouth, and genitals (private parts). Wash face and genitals (private parts) with your normal soap.  6. Wash thoroughly, paying special attention to the area where your surgery will be performed.  7. Thoroughly rinse your body with warm water.  8. Do not shower/wash with your normal soap after using and rinsing off the CHG soap.  9. Pat yourself dry with a clean towel.  10.  Wear clean pajamas to bed the night before surgery.  12. Place clean sheets on your bed the night of your first shower and do not sleep with pets.  13. Shower again with the CHG soap on the day of surgery prior to arriving at the hospital.  14. Do not apply any deodorants/lotions/powders.  15. Please wear clean clothes to the hospital.   [employer] to address health-related social needs:  [email or web address]

## 2024-09-19 ENCOUNTER — Encounter
Admission: RE | Admit: 2024-09-19 | Discharge: 2024-09-19 | Disposition: A | Discharge status: Home / Self Care | Source: Ambulatory Visit | Attending: Surgery | Admitting: Surgery

## 2024-09-19 DIAGNOSIS — I700 Atherosclerosis of aorta: Secondary | ICD-10-CM | POA: Diagnosis not present

## 2024-09-19 DIAGNOSIS — R000 Tachycardia, unspecified: Secondary | ICD-10-CM | POA: Diagnosis not present

## 2024-09-19 DIAGNOSIS — I10 Essential (primary) hypertension: Secondary | ICD-10-CM | POA: Insufficient documentation

## 2024-09-19 DIAGNOSIS — Z01818 Encounter for other preprocedural examination: Secondary | ICD-10-CM | POA: Diagnosis present

## 2024-09-19 DIAGNOSIS — Z01810 Encounter for preprocedural cardiovascular examination: Secondary | ICD-10-CM | POA: Diagnosis not present

## 2024-09-20 MED ORDER — PROPOFOL 10 MG/ML IV BOLUS
INTRAVENOUS | Status: AC
  Filled 2024-09-20: qty 20

## 2024-09-22 ENCOUNTER — Ambulatory Visit: Admitting: Anesthesiology

## 2024-09-22 ENCOUNTER — Other Ambulatory Visit: Payer: Self-pay

## 2024-09-22 ENCOUNTER — Encounter
Admission: RE | Disposition: A | Discharge status: Home / Self Care | Payer: Self-pay | Source: Ambulatory Visit | Attending: Surgery

## 2024-09-22 ENCOUNTER — Ambulatory Visit
Admission: RE | Admit: 2024-09-22 | Discharge: 2024-09-22 | Disposition: A | Discharge status: Home / Self Care | Source: Ambulatory Visit | Attending: Surgery | Admitting: Surgery

## 2024-09-22 ENCOUNTER — Encounter: Payer: Self-pay | Admitting: Surgery

## 2024-09-22 DIAGNOSIS — D1721 Benign lipomatous neoplasm of skin and subcutaneous tissue of right arm: Secondary | ICD-10-CM | POA: Insufficient documentation

## 2024-09-22 DIAGNOSIS — I10 Essential (primary) hypertension: Secondary | ICD-10-CM | POA: Diagnosis not present

## 2024-09-22 DIAGNOSIS — Z87891 Personal history of nicotine dependence: Secondary | ICD-10-CM | POA: Diagnosis not present

## 2024-09-22 DIAGNOSIS — M810 Age-related osteoporosis without current pathological fracture: Secondary | ICD-10-CM | POA: Diagnosis not present

## 2024-09-22 DIAGNOSIS — R2231 Localized swelling, mass and lump, right upper limb: Secondary | ICD-10-CM | POA: Diagnosis not present

## 2024-09-22 HISTORY — PX: EXCISION MASS UPPER EXTREMETIES: SHX6704

## 2024-09-22 SURGERY — EXCISION MASS UPPER EXTREMITIES
Anesthesia: Monitor Anesthesia Care | Laterality: Right

## 2024-09-22 MED ORDER — EPHEDRINE SULFATE-NACL 50-0.9 MG/10ML-% IV SOSY
PREFILLED_SYRINGE | INTRAVENOUS | Status: DC | PRN
  Administered 2024-09-22: 5 mg via INTRAVENOUS

## 2024-09-22 MED ORDER — PROPOFOL 1000 MG/100ML IV EMUL
INTRAVENOUS | Status: AC
  Filled 2024-09-22: qty 100

## 2024-09-22 MED ORDER — MIDAZOLAM HCL 2 MG/2ML IJ SOLN
INTRAMUSCULAR | Status: AC
  Filled 2024-09-22: qty 2

## 2024-09-22 MED ORDER — CEFAZOLIN SODIUM-DEXTROSE 2-4 GM/100ML-% IV SOLN
INTRAVENOUS | Status: AC
  Filled 2024-09-22: qty 100

## 2024-09-22 MED ORDER — ACETAMINOPHEN 10 MG/ML IV SOLN: 1000.0000 mg | Freq: Once | INTRAVENOUS | Status: DC | PRN

## 2024-09-22 MED ORDER — ORAL CARE MOUTH RINSE: 15.0000 mL | Freq: Once | OROMUCOSAL | Status: AC

## 2024-09-22 MED ORDER — FENTANYL CITRATE (PF) 100 MCG/2ML IJ SOLN
INTRAMUSCULAR | Status: DC | PRN
  Administered 2024-09-22: 25 ug via INTRAVENOUS

## 2024-09-22 MED ORDER — FENTANYL CITRATE (PF) 100 MCG/2ML IJ SOLN
INTRAMUSCULAR | Status: AC
  Filled 2024-09-22: qty 2

## 2024-09-22 MED ORDER — MIDAZOLAM HCL 2 MG/2ML IJ SOLN
INTRAMUSCULAR | Status: DC | PRN
  Administered 2024-09-22: 2 mg via INTRAVENOUS

## 2024-09-22 MED ORDER — ACETAMINOPHEN 500 MG PO TABS
1000.0000 mg | ORAL_TABLET | ORAL | Status: AC
  Administered 2024-09-22: 1000 mg via ORAL

## 2024-09-22 MED ORDER — CEFAZOLIN SODIUM-DEXTROSE 2-4 GM/100ML-% IV SOLN
2.0000 g | INTRAVENOUS | Status: AC
  Administered 2024-09-22: 2 g via INTRAVENOUS

## 2024-09-22 MED ORDER — CHLORHEXIDINE GLUCONATE 0.12 % MT SOLN
OROMUCOSAL | Status: AC
  Filled 2024-09-22: qty 15

## 2024-09-22 MED ORDER — ACETAMINOPHEN 500 MG PO TABS
ORAL_TABLET | ORAL | Status: AC
  Filled 2024-09-22: qty 2

## 2024-09-22 MED ORDER — PHENYLEPHRINE 80 MCG/ML (10ML) SYRINGE FOR IV PUSH (FOR BLOOD PRESSURE SUPPORT)
PREFILLED_SYRINGE | INTRAVENOUS | Status: AC
  Filled 2024-09-22: qty 10

## 2024-09-22 MED ORDER — OXYCODONE HCL 5 MG PO TABS: 5.0000 mg | ORAL_TABLET | Freq: Once | ORAL | Status: DC | PRN

## 2024-09-22 MED ORDER — CHLORHEXIDINE GLUCONATE CLOTH 2 % EX PADS: 6.0000 | MEDICATED_PAD | Freq: Once | CUTANEOUS | Status: DC

## 2024-09-22 MED ORDER — DEXAMETHASONE SODIUM PHOSPHATE 10 MG/ML IJ SOLN
INTRAMUSCULAR | Status: AC
  Filled 2024-09-22: qty 1

## 2024-09-22 MED ORDER — LIDOCAINE HCL (CARDIAC) PF 100 MG/5ML IV SOSY
PREFILLED_SYRINGE | INTRAVENOUS | Status: DC | PRN
  Administered 2024-09-22: 60 mg via INTRAVENOUS

## 2024-09-22 MED ORDER — LACTATED RINGERS IV SOLN: INTRAVENOUS | Status: DC

## 2024-09-22 MED ORDER — EPHEDRINE 5 MG/ML INJ
INTRAVENOUS | Status: AC
  Filled 2024-09-22: qty 5

## 2024-09-22 MED ORDER — CHLORHEXIDINE GLUCONATE 0.12 % MT SOLN
15.0000 mL | Freq: Once | OROMUCOSAL | Status: AC
  Administered 2024-09-22: 15 mL via OROMUCOSAL

## 2024-09-22 MED ORDER — PROPOFOL 500 MG/50ML IV EMUL
INTRAVENOUS | Status: DC | PRN
  Administered 2024-09-22: 160 ug/kg/min via INTRAVENOUS

## 2024-09-22 MED ORDER — DROPERIDOL 2.5 MG/ML IJ SOLN: 0.6250 mg | Freq: Once | INTRAMUSCULAR | Status: DC | PRN

## 2024-09-22 MED ORDER — TRAMADOL HCL 50 MG PO TABS: 50.0000 mg | ORAL_TABLET | Freq: Four times a day (QID) | ORAL | 0 refills | Status: DC | PRN

## 2024-09-22 MED ORDER — OXYCODONE HCL 5 MG/5ML PO SOLN: 5.0000 mg | Freq: Once | ORAL | Status: DC | PRN

## 2024-09-22 MED ORDER — FENTANYL CITRATE (PF) 100 MCG/2ML IJ SOLN: 25.0000 ug | INTRAMUSCULAR | Status: DC | PRN

## 2024-09-22 MED ORDER — BUPIVACAINE-EPINEPHRINE (PF) 0.25% -1:200000 IJ SOLN
INTRAMUSCULAR | Status: DC | PRN
  Administered 2024-09-22: 30 mL

## 2024-09-22 MED ORDER — LIDOCAINE HCL (PF) 2 % IJ SOLN
INTRAMUSCULAR | Status: AC
  Filled 2024-09-22: qty 5

## 2024-09-22 MED ORDER — ONDANSETRON HCL 4 MG/2ML IJ SOLN
INTRAMUSCULAR | Status: DC | PRN
Start: 2024-09-22 — End: 2024-09-22
  Administered 2024-09-22: 4 mg via INTRAVENOUS

## 2024-09-22 MED ORDER — ONDANSETRON HCL 4 MG/2ML IJ SOLN
INTRAMUSCULAR | Status: AC
Start: 2024-09-22 — End: 2024-09-22
  Filled 2024-09-22: qty 2

## 2024-09-22 MED ORDER — PHENYLEPHRINE 80 MCG/ML (10ML) SYRINGE FOR IV PUSH (FOR BLOOD PRESSURE SUPPORT)
PREFILLED_SYRINGE | INTRAVENOUS | Status: DC | PRN
  Administered 2024-09-22 (×4): 80 ug via INTRAVENOUS

## 2024-09-22 MED ORDER — DEXAMETHASONE SODIUM PHOSPHATE 10 MG/ML IJ SOLN
INTRAMUSCULAR | Status: DC | PRN
  Administered 2024-09-22: 10 mg via INTRAVENOUS

## 2024-09-22 MED ORDER — BUPIVACAINE-EPINEPHRINE (PF) 0.25% -1:200000 IJ SOLN
INTRAMUSCULAR | Status: AC
  Filled 2024-09-22: qty 30

## 2024-09-22 SURGICAL SUPPLY — 22 items
BLADE SURG 15 STRL LF DISP TIS (BLADE) ×1 IMPLANT
CHLORAPREP W/TINT 26 (MISCELLANEOUS) ×1 IMPLANT
DERMABOND ADVANCED .7 DNX12 (GAUZE/BANDAGES/DRESSINGS) ×1 IMPLANT
DRAPE LAPAROTOMY 100X77 ABD (DRAPES) ×1 IMPLANT
ELECTRODE REM PT RTRN 9FT ADLT (ELECTROSURGICAL) ×1 IMPLANT
GAUZE 4X4 16PLY ~~LOC~~+RFID DBL (SPONGE) ×1 IMPLANT
GLOVE BIO SURGEON STRL SZ7 (GLOVE) ×1 IMPLANT
GOWN STRL REUS W/ TWL LRG LVL3 (GOWN DISPOSABLE) ×2 IMPLANT
KIT TURNOVER KIT A (KITS) ×1 IMPLANT
LABEL OR SOLS (LABEL) ×1 IMPLANT
MANIFOLD NEPTUNE II (INSTRUMENTS) ×1 IMPLANT
NDL HYPO 22X1.5 SAFETY MO (MISCELLANEOUS) ×1 IMPLANT
NEEDLE HYPO 22X1.5 SAFETY MO (MISCELLANEOUS) ×1 IMPLANT
NS IRRIG 500ML POUR BTL (IV SOLUTION) ×1 IMPLANT
PACK BASIN MINOR ARMC (MISCELLANEOUS) ×1 IMPLANT
SPONGE T-LAP 18X18 ~~LOC~~+RFID (SPONGE) ×1 IMPLANT
SUT VIC AB 0 CT1 36 (SUTURE) ×1 IMPLANT
SUT VIC AB 2-0 CT2 27 (SUTURE) ×1 IMPLANT
SUTURE MNCRL 4-0 27XMF (SUTURE) ×1 IMPLANT
SYR 10ML LL (SYRINGE) ×1 IMPLANT
TRAP FLUID SMOKE EVACUATOR (MISCELLANEOUS) ×1 IMPLANT
WATER STERILE IRR 500ML POUR (IV SOLUTION) ×1 IMPLANT

## 2024-09-22 NOTE — Discharge Instructions (Signed)
 Lipoma Removal, Care After The following information offers guidance on how to care for yourself after your procedure. Your health care provider may also give you more specific instructions. If you have problems or questions, contact your health care provider. What can I expect after the procedure? After the procedure, it is common to have: Mild pain. Swelling. Bruising. Follow these instructions at home: Bathing  Do not take baths, swim, or use a hot tub until your health care provider approves. Ask your health care provider if you may take showers. You may only be allowed to take sponge baths. Keep your bandage (dressing) clean and dry until your health care provider says it can be removed. Incision care  Follow instructions from your health care provider about how to take care of your incision. Make sure you: Wash your hands with soap and water for at least 20 seconds before and after you change your dressing. If soap and water are not available, use hand sanitizer. Change your dressing as told by your health care provider. Leave stitches (sutures), skin glue, or adhesive strips in place. These skin closures may need to stay in place for 2 weeks or longer. If adhesive strip edges start to loosen and curl up, you may trim the loose edges. Do not remove adhesive strips completely unless your health care provider tells you to do that. Check your incision area every day for signs of infection. Check for: More redness, swelling, or pain. Fluid or blood. Warmth. Pus or a bad smell. Medicines Take over-the-counter and prescription medicines only as told by your health care provider. If you were prescribed an antibiotic medicine, use it as told by your health care provider. Do not stop using the antibiotic even if you start to feel better. General instructions  If you were given a sedative during the procedure, it can affect you for several hours. Do not drive or operate machinery until your  health care provider says that it is safe. Do not use any products that contain nicotine or tobacco before the procedure. These products include cigarettes, chewing tobacco, and vaping devices, such as e-cigarettes. These can delay healing after surgery. If you need help quitting, ask your health care provider. Return to your normal activities as told by your health care provider. Ask your health care provider what activities are safe for you. Keep all follow-up visits. This is important. Contact a health care provider if: You have more redness, swelling, or pain around your incision. You have fluid or blood coming from your incision. Your incision feels warm to the touch. You have pus or a bad smell coming from your incision. You have pain that does not get better with medicine. Get help right away if: You have chills or a fever. You have severe pain. Summary After the procedure, it is common to have mild pain, swelling, and bruising. Follow instructions from your health care provider about how to take care of your incision. Contact a health care provider if you have signs of infection such as more redness, swelling, or pain. This information is not intended to replace advice given to you by your health care provider. Make sure you discuss any questions you have with your health care provider. Document Revised: 01/03/2022 Document Reviewed: 01/03/2022 Elsevier Patient Education  2024 [employer].

## 2024-09-22 NOTE — Op Note (Signed)
 DIAGNOSIS Symptomatic deep soft tissue mass Right Upper arm  PROCEDURES 1.  Excision of lipoma Right arm deep 8cms  ANESTHESIA: MAC General + Marcaine  .25% with epinephrine   EBL: Minimal  FINDINGS: Lipoma deep subfascial  After informed consent was obtained the patient was prepped and draped in the usual sterile fashion.  Local anesthetic was injected over the area of interest.  15 blade knife used to create an incision and the subcutaneous tissue was dissected free with hemostats.  The mass was dissected free from adjacent structures it was multilobulated lipoma with extension pass the fascia, fascia incised to excised the rest of the deep lipoma. Specimen was sent for permanent pathology.  Hemostasis obtained with cautery. Fascia closed w 2-0 vicryl and  The wound was closed in a 2 layer fashion with dermal layer using interrupted 3-0 Vicryl.  The skin was closed in a subcuticular fashion using 4-0 Monocryl.  Dermabond was applied.  No complications. The  patient tolerated procedure well

## 2024-09-22 NOTE — Interval H&P Note (Signed)
 History and Physical Interval Note:  09/22/2024 2:48 PM  Virginia Warner  has presented today for surgery, with the diagnosis of right arm mass.  The various methods of treatment have been discussed with the patient and family. After consideration of risks, benefits and other options for treatment, the patient has consented to  Procedure(s): EXCISION MASS UPPER EXTREMITIES (Right) as a surgical intervention.  The patient's history has been reviewed, patient examined, no change in status, stable for surgery.  I have reviewed the patient's chart and labs.  Questions were answered to the patient's satisfaction.     Uday Jantz F Aylani Spurlock

## 2024-09-22 NOTE — Transfer of Care (Signed)
 Immediate Anesthesia Transfer of Care Note  Patient: Virginia Warner  Procedure(s) Performed: EXCISION MASS UPPER EXTREMITIES (Right)  Patient Location: PACU  Anesthesia Type:MAC  Level of Consciousness: drowsy  Airway & Oxygen Therapy: Patient Spontanous Breathing and Patient connected to face mask oxygen  Post-op Assessment: Report given to RN and Post -op Vital signs reviewed and stable  Post vital signs: Reviewed and stable  Last Vitals:  Vitals Value Taken Time  BP 108/60 09/22/24 16:09  Temp 35.7 1609  Pulse 75 09/22/24 16:11  Resp 13 09/22/24 16:11  SpO2 100 % 09/22/24 16:11  Vitals shown include unfiled device data.  Last Pain:  Vitals:   09/22/24 1220  TempSrc: Temporal  PainSc: 0-No pain         Complications: No notable events documented.

## 2024-09-22 NOTE — Anesthesia Preprocedure Evaluation (Addendum)
 Anesthesia Evaluation  Patient identified by MRN, date of birth, ID band Patient awake    Reviewed: Allergy & Precautions, H&P , NPO status , Patient's Chart, lab work & pertinent test results  Airway Mallampati: II  TM Distance: >3 FB Neck ROM: full    Dental no notable dental hx.    Pulmonary former smoker   Pulmonary exam normal        Cardiovascular Exercise Tolerance: Good hypertension, Normal cardiovascular exam     Neuro/Psych  PSYCHIATRIC DISORDERS Anxiety     negative neurological ROS     GI/Hepatic negative GI ROS, Neg liver ROS,,,  Endo/Other  negative endocrine ROS    Renal/GU      Musculoskeletal  (+) Arthritis ,    Abdominal Normal abdominal exam  (+)   Peds  Hematology negative hematology ROS (+)   Anesthesia Other Findings Past Medical History: No date: Adjustment insomnia No date: Allergy No date: Anxiety No date: Aortic atherosclerosis No date: Arthritis No date: Bacterial vaginitis No date: Breast cancer (HCC) 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 No date: Corns and callosities No date: Esophageal stricture No date: GERD (gastroesophageal reflux disease) No date: History of kidney stones No date: Hyperlipidemia No date: Hypertension No date: Lipoma of right upper extremity No date: Lymphadenopathy No date: 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) No date: Osteoporosis No date: Personal history of radiation therapy No date: Polyp of colon No date: Pre-diabetes No date: Renal calculi No date: Seasonal allergies No date: Tachycardia  Past Surgical History: No date: ABDOMINAL HYSTERECTOMY W/ PARTIAL VAGINACTOMY; N/A 06/12/2020: BREAST BIOPSY; Left     Comment:  US  Bx, [place], [facility] 07/04/2020: BREAST LUMPECTOMY; Left     Comment:  [facility] lumpectomy with radiation 07/10/2020: BREAST LUMPECTOMY,RADIO FREQ LOCALIZER,AXILLARY SENTINEL  LYMPH NODE  BIOPSY; Left     Comment:  Procedure: BREAST LUMPECTOMY,RADIO FREQ               LOCALIZER,AXILLARY SENTINEL LYMPH NODE BIOPSY;  Surgeon:               Jordis Laneta FALCON, MD;  Location: [facility] ORS;  Service:               General;  Laterality: Left; 10/18/2015: COLONOSCOPY WITH PROPOFOL ; N/A     Comment:  Procedure: COLONOSCOPY WITH PROPOFOL ;  Surgeon: Lamar ONEIDA Holmes, MD;  Location: [facility] ENDOSCOPY;  Service:               Endoscopy;  Laterality: N/A; 08/19/2021: COLONOSCOPY WITH PROPOFOL ; N/A     Comment:  Procedure: COLONOSCOPY WITH PROPOFOL ;  Surgeon:               Maryruth Ole ONEIDA, MD;  Location: [facility] ENDOSCOPY;                Service: Endoscopy;  Laterality: N/A; 04/04/2024: ESOPHAGOGASTRODUODENOSCOPY; N/A     Comment:  Procedure: EGD (ESOPHAGOGASTRODUODENOSCOPY);  Surgeon:               Maryruth Ole ONEIDA, MD;  Location: [facility] ENDOSCOPY;                Service: Endoscopy;  Laterality: N/A; 05/03/2024: ESOPHAGOGASTRODUODENOSCOPY; N/A     Comment:  Procedure: EGD (ESOPHAGOGASTRODUODENOSCOPY);  Surgeon:               Maryruth Ole ONEIDA, MD;  Location: [facility] ENDOSCOPY;  Service: Endoscopy;  Laterality: N/A; 03/07/2024: ESOPHAGOGASTRODUODENOSCOPY (EGD) WITH PROPOFOL ; N/A     Comment:  Procedure: ESOPHAGOGASTRODUODENOSCOPY (EGD) WITH               PROPOFOL ;  Surgeon: Maryruth Ole DASEN, MD;  Location:               [facility] ENDOSCOPY;  Service: Endoscopy;  Laterality: N/A; No date: INGUINAL HERNIA REPAIR     Comment:  age 4's No date: POLYPECTOMY     Reproductive/Obstetrics negative OB ROS                              Anesthesia Physical Anesthesia Plan  ASA: 3  Anesthesia Plan: General   Post-op Pain Management: Tylenol  PO (pre-op)*, Toradol IV (intra-op)* and Regional block*   Induction: Intravenous  PONV Risk Score and Plan: 2 and Dexamethasone , Ondansetron , Midazolam  and Treatment may vary due to age or medical  condition  Airway Management Planned: Natural Airway  Additional Equipment:   Intra-op Plan:   Post-operative Plan:   Informed Consent: I have reviewed the patients History and Physical, chart, labs and discussed the procedure including the risks, benefits and alternatives for the proposed anesthesia with the patient or authorized representative who has indicated his/her understanding and acceptance.     Dental Advisory Given  Plan Discussed with: Anesthesiologist, CRNA and Surgeon  Anesthesia Plan Comments: (Back-up LMA planned)         Anesthesia Quick Evaluation

## 2024-09-23 ENCOUNTER — Encounter: Payer: Self-pay | Admitting: Surgery

## 2024-09-23 ENCOUNTER — Other Ambulatory Visit: Payer: Self-pay | Admitting: Nurse Practitioner

## 2024-09-23 DIAGNOSIS — I700 Atherosclerosis of aorta: Secondary | ICD-10-CM

## 2024-09-23 NOTE — Anesthesia Postprocedure Evaluation (Signed)
 Anesthesia Post Note  Patient: Virginia Warner  Procedure(s) Performed: EXCISION MASS UPPER EXTREMITIES (Right)  Patient location during evaluation: PACU Anesthesia Type: MAC Level of consciousness: awake and alert Pain management: pain level controlled Vital Signs Assessment: post-procedure vital signs reviewed and stable Respiratory status: spontaneous breathing, nonlabored ventilation, respiratory function stable and patient connected to nasal cannula oxygen Cardiovascular status: blood pressure returned to baseline and stable Postop Assessment: no apparent nausea or vomiting Anesthetic complications: no   No notable events documented.   Last Vitals:  Vitals:   09/22/24 1640 09/22/24 1655  BP: (!) 143/85 (!) 147/80  Pulse: 83 83  Resp: (!) 21 16  Temp:    SpO2: 99% 97%    Last Pain:  Vitals:   09/22/24 1655  TempSrc:   PainSc: 0-No pain                 Lendia LITTIE Mae

## 2024-09-26 LAB — SURGICAL PATHOLOGY

## 2024-10-05 ENCOUNTER — Ambulatory Visit (INDEPENDENT_AMBULATORY_CARE_PROVIDER_SITE_OTHER): Admitting: Surgery

## 2024-10-05 ENCOUNTER — Encounter: Payer: Self-pay | Admitting: Surgery

## 2024-10-05 VITALS — BP 136/72 | HR 79 | Ht 64.0 in | Wt 133.0 lb

## 2024-10-05 DIAGNOSIS — Z09 Encounter for follow-up examination after completed treatment for conditions other than malignant neoplasm: Secondary | ICD-10-CM

## 2024-10-05 DIAGNOSIS — D1721 Benign lipomatous neoplasm of skin and subcutaneous tissue of right arm: Secondary | ICD-10-CM

## 2024-10-05 NOTE — Patient Instructions (Signed)
   Follow-up with our office as needed.  Please call and ask to speak with a nurse if you develop questions or concerns.

## 2024-10-06 NOTE — Progress Notes (Signed)
 S/p Right arm excision Doing well Wound healing well w/o infection Doing well w/o complications RTC prn

## 2024-10-12 ENCOUNTER — Ambulatory Visit: Admitting: Nurse Practitioner

## 2024-10-13 ENCOUNTER — Encounter: Payer: Self-pay | Admitting: Physician Assistant

## 2024-10-13 ENCOUNTER — Ambulatory Visit (INDEPENDENT_AMBULATORY_CARE_PROVIDER_SITE_OTHER): Admitting: Physician Assistant

## 2024-10-13 VITALS — BP 136/80 | HR 86 | Temp 98.3°F | Resp 16 | Ht 64.0 in | Wt 132.0 lb

## 2024-10-13 DIAGNOSIS — J069 Acute upper respiratory infection, unspecified: Secondary | ICD-10-CM

## 2024-10-13 MED ORDER — AZITHROMYCIN 250 MG PO TABS: ORAL_TABLET | ORAL | 0 refills | Status: AC

## 2024-10-13 NOTE — Progress Notes (Signed)
 [facility] [address] [place], [place] [address]  Internal MEDICINE  Office Visit Note  Patient Name: Virginia Warner  [id number]  [id number]  Date of Service: 10/13/2024  Chief Complaint  Patient presents with   Acute Visit   Nasal Congestion   Hoarse     HPI Pt is here for a sick visit. -hoarseness since Monday night -coughing, worse at night -trying some tea and honey -no fever or chills -feels well overall -some nasal spray -covid test negative -did sing at church sunday  Current Medication:  Outpatient Encounter Medications as of 10/13/2024  Medication Sig   acetaminophen  (TYLENOL ) 650 MG CR tablet Take 1,300 mg by mouth every morning. And PRN   alendronate  (FOSAMAX ) 70 MG tablet TAKE 1 TABLET EVERY 7 DAYS WITH A FULL GLASS OF WATER ON AN EMPTY STOMACH DO NOT LIE DOWN FOR AT LEAST 30 MIN (Patient taking differently: 70 mg once a week. Wednesdays)   aspirin EC 81 MG tablet Take 81 mg by mouth every other day.   bismuth subsalicylate (PEPTO BISMOL) 262 MG chewable tablet Chew 524 mg by mouth as needed.   busPIRone  (BUSPAR ) 5 MG tablet TAKE 1 TABLET BY MOUTH ONCE DAILY (Patient taking differently: Take 5 mg by mouth every morning.)   diltiazem  (CARDIZEM  CD) 120 MG 24 hr capsule Take 1 capsule (120 mg total) by mouth daily. (Patient taking differently: Take 120 mg by mouth every morning.)   fexofenadine (ALLEGRA) 180 MG tablet Take 180 mg by mouth every morning.   fluticasone  (FLONASE ) 50 MCG/ACT nasal spray SPRAY TWICE INTO EACH NOSTRIL ONCE DAILY (Patient taking differently: 1 spray daily as needed for allergies.)   gabapentin  (NEURONTIN ) 100 MG capsule TAKE ONE CAPSULE BY MOUTH EVERY MORNING,ONE CAPSULE IN THE AFTERNOON, AND 3 CAPSULES AT BEDTIME. (Patient taking differently: 100 mg 3 (three) times daily. TAKE ONE CAPSULE BY MOUTH EVERY MORNING,ONE CAPSULE IN THE AFTERNOON, AND 3 CAPSULES AT BEDTIME.)   latanoprost  (XALATAN ) 0.005 % ophthalmic solution  Place 1 drop into both eyes at bedtime.   letrozole  (FEMARA ) 2.5 MG tablet TAKE 1 TABLET BY MOUTH DAILY   lisinopril  (ZESTRIL ) 20 MG tablet TAKE ONE TABLET BY MOUTH EVERY MORNING AND ONE IN EVENING (Patient taking differently: Take 20 mg by mouth 2 (two) times daily. TAKE ONE TABLET BY MOUTH EVERY MORNING AND ONE IN EVENING)   rosuvastatin  (CRESTOR ) 5 MG tablet TAKE 1 TABLET BY MOUTH ONCE DAILY   traMADol  (ULTRAM ) 50 MG tablet Take 50 mg by mouth every 6 (six) hours as needed.   traMADol  (ULTRAM ) 50 MG tablet Take 1 tablet (50 mg total) by mouth every 6 (six) hours as needed for moderate pain (pain score 4-6) or severe pain (pain score 7-10).   No facility-administered encounter medications on file as of 10/13/2024.      Medical History: Past Medical History:  Diagnosis Date   Adjustment insomnia    Allergy    Anxiety    Aortic atherosclerosis    Arthritis    Bacterial vaginitis    Breast cancer (HCC)    Congenital deformities of feet    Corns and callosities    Esophageal stricture    GERD (gastroesophageal reflux disease)    History of kidney stones    Hyperlipidemia    Hypertension    Lipoma of right upper extremity    Lymphadenopathy    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)    Osteoporosis  Personal history of radiation therapy    Polyp of colon    Pre-diabetes    Renal calculi    Seasonal allergies    Tachycardia      Vital Signs: BP 136/80   Pulse 86   Temp 98.3 F (36.8 C)   Resp 16   Ht 5' 4 (1.626 m)   Wt 132 lb (59.9 kg)   SpO2 97%   BMI 22.66 kg/m    Review of Systems  Constitutional:  Negative for fatigue and fever.  HENT:  Positive for congestion, postnasal drip, sore throat and voice change. Negative for mouth sores and sinus pressure.   Respiratory:  Positive for cough. Negative for shortness of breath.   Cardiovascular:  Negative for chest pain.  Genitourinary:  Negative for flank pain.   Psychiatric/Behavioral: Negative.      Physical Exam Vitals reviewed.  Constitutional:      General: She is not in acute distress.    Appearance: Normal appearance. She is normal weight. She is not ill-appearing.  HENT:     Head: Normocephalic and atraumatic.  Eyes:     Extraocular Movements: Extraocular movements intact.  Cardiovascular:     Rate and Rhythm: Normal rate and regular rhythm.  Pulmonary:     Effort: Pulmonary effort is normal. No respiratory distress.     Breath sounds: Rhonchi present.  Neurological:     Mental Status: She is alert and oriented to person, place, and time.  Psychiatric:        Mood and Affect: Mood normal.        Behavior: Behavior normal.       Assessment/Plan: 1. Upper respiratory tract infection, unspecified type (Primary) Will start zpak and advised to continue warm tea with honey and voice rest to help throat/hoarseness - azithromycin  (ZITHROMAX ) 250 MG tablet; Take 2 tablets on day 1, then 1 tablet daily on days 2 through 5  Dispense: 6 tablet; Refill: 0   General Counseling: Virginia Warner verbalizes understanding of the findings of todays visit and agrees with plan of treatment. I have discussed any further diagnostic evaluation that may be needed or ordered today. We also reviewed her medications today. she has been encouraged to call the office with any questions or concerns that should arise related to todays visit.    Counseling:    No orders of the defined types were placed in this encounter.   No orders of the defined types were placed in this encounter.   Time spent:25 Minutes

## 2024-10-14 ENCOUNTER — Encounter: Payer: Self-pay | Admitting: Oncology

## 2024-10-14 ENCOUNTER — Inpatient Hospital Stay: Attending: Oncology | Admitting: Oncology

## 2024-10-14 VITALS — BP 147/75 | HR 102 | Temp 97.5°F | Resp 16 | Wt 135.0 lb

## 2024-10-14 DIAGNOSIS — Z79811 Long term (current) use of aromatase inhibitors: Secondary | ICD-10-CM | POA: Diagnosis not present

## 2024-10-14 DIAGNOSIS — Z1721 Progesterone receptor positive status: Secondary | ICD-10-CM | POA: Insufficient documentation

## 2024-10-14 DIAGNOSIS — Z803 Family history of malignant neoplasm of breast: Secondary | ICD-10-CM | POA: Diagnosis not present

## 2024-10-14 DIAGNOSIS — M810 Age-related osteoporosis without current pathological fracture: Secondary | ICD-10-CM | POA: Diagnosis not present

## 2024-10-14 DIAGNOSIS — Z170 Estrogen receptor positive status [ER+]: Secondary | ICD-10-CM | POA: Insufficient documentation

## 2024-10-14 DIAGNOSIS — C50912 Malignant neoplasm of unspecified site of left female breast: Secondary | ICD-10-CM | POA: Diagnosis present

## 2024-10-14 DIAGNOSIS — Z87891 Personal history of nicotine dependence: Secondary | ICD-10-CM | POA: Insufficient documentation

## 2024-10-14 DIAGNOSIS — Z1732 Human epidermal growth factor receptor 2 negative status: Secondary | ICD-10-CM | POA: Insufficient documentation

## 2024-10-14 DIAGNOSIS — Z7983 Long term (current) use of bisphosphonates: Secondary | ICD-10-CM | POA: Insufficient documentation

## 2024-10-14 DIAGNOSIS — Z08 Encounter for follow-up examination after completed treatment for malignant neoplasm: Secondary | ICD-10-CM

## 2024-10-14 DIAGNOSIS — Z79899 Other long term (current) drug therapy: Secondary | ICD-10-CM | POA: Insufficient documentation

## 2024-10-14 DIAGNOSIS — Z923 Personal history of irradiation: Secondary | ICD-10-CM | POA: Diagnosis not present

## 2024-10-14 DIAGNOSIS — Z853 Personal history of malignant neoplasm of breast: Secondary | ICD-10-CM | POA: Diagnosis not present

## 2024-10-14 NOTE — Progress Notes (Signed)
 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  This was followed by diagnostic mammogram and ultrasound which showed a 7 x 7 x 4 mm mass 3 cm from the nipple at the 7 o'clock position.  Normal-appearing left axillary lymph nodes.  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  ER strongly positive greater than 90%, PR 1 to 10% positive and HER-2 negative   Patient underwent lumpectomy and sentinel lymph node biopsy on 07/10/2020.  Final pathology showed 8 mm grade 1 invasive mucinous carcinoma with negative margins.  Sentinel lymph nodes were negative for malignancy.   Patient completed adjuvant radiation therapy and started letrozole  in November 2021.  Patient has baseline osteoporosis at least dating back to 2021 and has been on Fosamax  since then    Interval history- Virginia Warner is a 78 year old female with breast cancer who presents for follow-up of her treatment and bone health.  She has been undergoing  treatment for breast cancer for the past four years and is currently on letrozole , with one year remaining in her five-year treatment plan. She has no issues tolerating letrozole , including no hot flashes or other side effects. Her last mammogram in June was normal, and she has no new breast concerns.  Three weeks ago, she underwent a procedure to remove a lipoma from her arm, performed by the same surgeon who previously operated on her breast. She has no pain from the site and notes that it is healing well, allowing her to wear shirts comfortably.   ECOG PS- 1 Pain scale- 0   Review of systems- Review of Systems  Constitutional:  Negative for chills, fever, malaise/fatigue and weight loss.  HENT:  Negative for congestion, ear discharge and nosebleeds.   Eyes:  Negative for blurred vision.  Respiratory:  Negative for cough, hemoptysis, sputum production, shortness of breath and wheezing.   Cardiovascular:  Negative for chest pain, palpitations, orthopnea and claudication.  Gastrointestinal:  Negative for abdominal pain, blood in stool, constipation, diarrhea, heartburn, melena, nausea and vomiting.  Genitourinary:  Negative for dysuria, flank pain, frequency, hematuria and urgency.  Musculoskeletal:  Negative for back pain, joint pain and myalgias.  Skin:  Negative for rash.  Neurological:  Negative for dizziness, tingling, focal weakness, seizures, weakness and headaches.  Endo/Heme/Allergies:  Does not bruise/bleed easily.  Psychiatric/Behavioral:  Negative for depression and suicidal ideas. The patient does not have insomnia.       Allergies  Allergen Reactions   Aspirin Nausea And Vomiting    Cannot tolerate unless enteric coated   Atorvastatin Other (See Comments)    Bone pain  Past Medical History:  Diagnosis Date   Adjustment insomnia    Allergy    Anxiety    Aortic atherosclerosis    Arthritis    Bacterial vaginitis    Breast cancer (HCC)    Congenital  deformities of feet    Corns and callosities    Esophageal stricture    GERD (gastroesophageal reflux disease)    History of kidney stones    Hyperlipidemia    Hypertension    Lipoma of right upper extremity    Lymphadenopathy    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)    Osteoporosis    Personal history of radiation therapy    Polyp of colon    Pre-diabetes    Renal calculi    Seasonal allergies    Tachycardia      Past Surgical History:  Procedure Laterality Date   ABDOMINAL HYSTERECTOMY W/ PARTIAL VAGINACTOMY N/A    BREAST BIOPSY Left 06/12/2020   US  Bx, [place], [facility]   BREAST LUMPECTOMY Left 07/04/2020   [facility] lumpectomy with radiation   BREAST LUMPECTOMY,RADIO FREQ LOCALIZER,AXILLARY SENTINEL LYMPH NODE BIOPSY Left 07/10/2020   Procedure: BREAST LUMPECTOMY,RADIO FREQ LOCALIZER,AXILLARY SENTINEL LYMPH NODE BIOPSY;  Surgeon: Jordis Laneta FALCON, MD;  Location: [facility] ORS;  Service: General;  Laterality: Left;   COLONOSCOPY WITH PROPOFOL  N/A 10/18/2015   Procedure: COLONOSCOPY WITH PROPOFOL ;  Surgeon: Lamar ONEIDA Holmes, MD;  Location: [facility] ENDOSCOPY;  Service: Endoscopy;  Laterality: N/A;   COLONOSCOPY WITH PROPOFOL  N/A 08/19/2021   Procedure: COLONOSCOPY WITH PROPOFOL ;  Surgeon: Maryruth Ole ONEIDA, MD;  Location: [facility] ENDOSCOPY;  Service: Endoscopy;  Laterality: N/A;   ESOPHAGOGASTRODUODENOSCOPY N/A 04/04/2024   Procedure: EGD (ESOPHAGOGASTRODUODENOSCOPY);  Surgeon: Maryruth Ole ONEIDA, MD;  Location: [facility] ENDOSCOPY;  Service: Endoscopy;  Laterality: N/A;   ESOPHAGOGASTRODUODENOSCOPY N/A 05/03/2024   Procedure: EGD (ESOPHAGOGASTRODUODENOSCOPY);  Surgeon: Maryruth Ole ONEIDA, MD;  Location: [facility] ENDOSCOPY;  Service: Endoscopy;  Laterality: N/A;   ESOPHAGOGASTRODUODENOSCOPY (EGD) WITH PROPOFOL  N/A 03/07/2024   Procedure: ESOPHAGOGASTRODUODENOSCOPY (EGD) WITH PROPOFOL ;  Surgeon: Maryruth Ole ONEIDA, MD;  Location: [facility] ENDOSCOPY;  Service: Endoscopy;   Laterality: N/A;   EXCISION MASS UPPER EXTREMETIES Right 09/22/2024   Procedure: EXCISION MASS UPPER EXTREMITIES;  Surgeon: Jordis Laneta FALCON, MD;  Location: [facility] ORS;  Service: General;  Laterality: Right;   INGUINAL HERNIA REPAIR     age 65's   POLYPECTOMY      Social History   Socioeconomic History   Marital status: Divorced    Spouse name: Not on file   Number of children: Not on file   Years of education: Not on file   Highest education level: Not on file  Occupational History   Not on file  Tobacco Use   Smoking status: Former    Current packs/day: 0.00    Types: Cigarettes    Quit date: 11/2007    Years since quitting: 16.8    Passive exposure: Past   Smokeless tobacco: Never  Vaping Use   Vaping status: Never Used  Substance and Sexual Activity   Alcohol use: Not Currently    Alcohol/week: 0.0 standard drinks of alcohol   Drug use: No   Sexual activity: Not Currently  Other Topics Concern   Not on file  Social History Narrative   Not on file   Social Drivers of Health   Financial Resource Strain: Low Risk  (08/23/2024)   Received from [facility]   Overall Financial Resource Strain (CARDIA)  Difficulty of Paying Living Expenses: Not hard at all  Food Insecurity: No Food Insecurity (08/23/2024)   Received from [facility]   Hunger Vital Sign    Within the past 12 months, you worried that your food would run out before you got the money to buy more.: Never true    Within the past 12 months, the food you bought just didn't last and you didn't have money to get more.: Never true  Transportation Needs: No Transportation Needs (08/23/2024)   Received from [facility] - Transportation    In the past 12 months, has lack of transportation kept you from medical appointments or from getting medications?: No    Lack of Transportation (Non-Medical): No  Physical Activity: Not on file  Stress: Not on file   Social Connections: Not on file  Intimate Partner Violence: Not on file    Family History  Problem Relation Age of Onset   Cancer Brother    Breast cancer Paternal Aunt      Current Outpatient Medications:    acetaminophen  (TYLENOL ) 650 MG CR tablet, Take 1,300 mg by mouth every morning. And PRN, Disp: , Rfl:    aspirin EC 81 MG tablet, Take 81 mg by mouth every other day., Disp: , Rfl:    azithromycin  (ZITHROMAX ) 250 MG tablet, Take 2 tablets on day 1, then 1 tablet daily on days 2 through 5, Disp: 6 tablet, Rfl: 0   bismuth subsalicylate (PEPTO BISMOL) 262 MG chewable tablet, Chew 524 mg by mouth as needed., Disp: , Rfl:    busPIRone  (BUSPAR ) 5 MG tablet, TAKE 1 TABLET BY MOUTH ONCE DAILY, Disp: 30 tablet, Rfl: 2   diltiazem  (CARDIZEM  CD) 120 MG 24 hr capsule, Take 1 capsule (120 mg total) by mouth daily. (Patient taking differently: Take 120 mg by mouth every morning.), Disp: 90 capsule, Rfl: 3   fexofenadine (ALLEGRA) 180 MG tablet, Take 180 mg by mouth every morning., Disp: , Rfl:    fluticasone  (FLONASE ) 50 MCG/ACT nasal spray, SPRAY TWICE INTO EACH NOSTRIL ONCE DAILY (Patient taking differently: 1 spray daily as needed for allergies.), Disp: 16 g, Rfl: 3   latanoprost  (XALATAN ) 0.005 % ophthalmic solution, Place 1 drop into both eyes at bedtime., Disp: , Rfl:    letrozole  (FEMARA ) 2.5 MG tablet, TAKE 1 TABLET BY MOUTH DAILY, Disp: 30 tablet, Rfl: 2   lisinopril  (ZESTRIL ) 20 MG tablet, TAKE ONE TABLET BY MOUTH EVERY MORNING AND ONE IN EVENING (Patient taking differently: Take 20 mg by mouth 2 (two) times daily. TAKE ONE TABLET BY MOUTH EVERY MORNING AND ONE IN EVENING), Disp: 180 tablet, Rfl: 3   rosuvastatin  (CRESTOR ) 5 MG tablet, TAKE 1 TABLET BY MOUTH ONCE DAILY, Disp: 90 tablet, Rfl: 1   traMADol  (ULTRAM ) 50 MG tablet, Take 50 mg by mouth every 6 (six) hours as needed., Disp: , Rfl:    traMADol  (ULTRAM ) 50 MG tablet, Take 1 tablet (50 mg total) by mouth every 6 (six) hours as  needed for moderate pain (pain score 4-6) or severe pain (pain score 7-10)., Disp: 12 tablet, Rfl: 0   alendronate  (FOSAMAX ) 70 MG tablet, TAKE 1 TABLET EVERY 7 DAYS WITH A FULL GLASS OF WATER ON AN EMPTY STOMACH DO NOT LIE DOWN FOR AT LEAST 30 MIN (Patient taking differently: 70 mg once a week. Wednesdays), Disp: 4 tablet, Rfl: 3   gabapentin  (NEURONTIN ) 100 MG capsule, TAKE ONE CAPSULE BY MOUTH EVERY MORNING,ONE CAPSULE  IN THE AFTERNOON, AND 3 CAPSULES AT BEDTIME. (Patient taking differently: 100 mg 3 (three) times daily. TAKE ONE CAPSULE BY MOUTH EVERY MORNING,ONE CAPSULE IN THE AFTERNOON, AND 3 CAPSULES AT BEDTIME.), Disp: 150 capsule, Rfl: 3  Physical exam:  Vitals:   10/14/24 1114 10/14/24 1118  BP: (!) 154/85 (!) 147/75  Pulse: (!) 102   Resp: 16   Temp: (!) 97.5 F (36.4 C)   TempSrc: Tympanic   SpO2: 99%   Weight: 135 lb (61.2 kg)    Physical Exam Cardiovascular:     Rate and Rhythm: Normal rate and regular rhythm.     Heart sounds: Normal heart sounds.  Pulmonary:     Effort: Pulmonary effort is normal.     Breath sounds: Normal breath sounds.  Musculoskeletal:     Comments: Reason for scar of lipoma removal from the right arm noted and healing well  Skin:    General: Skin is warm and dry.  Neurological:     Mental Status: She is alert and oriented to person, place, and time.    Breast exam was performed in seated and lying down position. Patient is status post left lumpectomy with a well-healed surgical scar. No evidence of any palpable masses. No evidence of axillary adenopathy. No evidence of any palpable masses or lumps in the right breast. No evidence of right axillary adenopathy   I have personally reviewed labs listed below:    Latest Ref Rng & Units 06/13/2024    8:38 AM  CMP  Glucose 70 - 99 mg/dL 886   BUN 8 - 23 mg/dL 11   Creatinine 9.55 - 1.00 mg/dL 9.39   Sodium 864 - 854 mmol/L 141   Potassium 3.5 - 5.1 mmol/L 3.7   Chloride 98 - 111 mmol/L 106    CO2 22 - 32 mmol/L 27   Calcium  8.9 - 10.3 mg/dL 9.6   Total Protein 6.5 - 8.1 g/dL 7.9   Total Bilirubin 0.0 - 1.2 mg/dL 0.9   Alkaline Phos 38 - 126 U/L 31   AST 15 - 41 U/L 17   ALT 0 - 44 U/L 15       Latest Ref Rng & Units 06/13/2024    8:38 AM  CBC  WBC 4.0 - 10.5 K/uL 5.2   Hemoglobin 12.0 - 15.0 g/dL 86.6   Hematocrit 63.9 - 46.0 % 41.8   Platelets 150 - 400 K/uL 356       Assessment and plan- Patient is a 78 y.o. female with history of stage I left breast cancer ER/PR positive HER2 negative presently on letrozole  here for a routine follow-up  Breast cancer on letrozole  therapy Breast cancer managed with letrozole  for four years, well-tolerated, no side effects. Mammogram in June showed no concerning findings. Letrozole  therapy to continue until November 2026. - Continue letrozole  therapy until November 2026. - Mammogram from June 2025 was unremarkable.  Clinically she is doing well with no concerning signs and symptoms of recurrence based on today's exam  Osteoporosis Patient has baseline osteoporosis even dating back to 2021 when she was noted to have a T-score of -2.6 in her right femur and at that time her T-score was -1.9 in her left forearm.  Presently the left forearm score has worsened to -2.6 and right femur is presently at -2.4.  She will continue with Fosamax  at this time and I will consider giving her her drug holiday next year after she completes 5 years of treatment  Visit Diagnosis 1. Osteoporosis of forearm   2. High risk medication use   3. Use of letrozole  (Femara )   4. Encounter for follow-up surveillance of breast cancer   5. H/O ongoing treatment with alendronate  (Fosamax )      Dr. Annah Skene, MD, MPH [facility] at [facility] [phone] 10/14/2024 12:46 PM

## 2024-10-17 ENCOUNTER — Ambulatory Visit (INDEPENDENT_AMBULATORY_CARE_PROVIDER_SITE_OTHER): Admitting: Nurse Practitioner

## 2024-10-17 ENCOUNTER — Encounter: Payer: Self-pay | Admitting: Nurse Practitioner

## 2024-10-17 VITALS — BP 135/70 | HR 95 | Temp 95.6°F | Resp 16 | Ht 64.0 in | Wt 136.0 lb

## 2024-10-17 DIAGNOSIS — R7303 Prediabetes: Secondary | ICD-10-CM | POA: Diagnosis not present

## 2024-10-17 DIAGNOSIS — I700 Atherosclerosis of aorta: Secondary | ICD-10-CM

## 2024-10-17 DIAGNOSIS — I10 Essential (primary) hypertension: Secondary | ICD-10-CM

## 2024-10-17 DIAGNOSIS — Z170 Estrogen receptor positive status [ER+]: Secondary | ICD-10-CM

## 2024-10-17 DIAGNOSIS — D1721 Benign lipomatous neoplasm of skin and subcutaneous tissue of right arm: Secondary | ICD-10-CM

## 2024-10-17 DIAGNOSIS — Z23 Encounter for immunization: Secondary | ICD-10-CM

## 2024-10-17 DIAGNOSIS — F411 Generalized anxiety disorder: Secondary | ICD-10-CM

## 2024-10-17 DIAGNOSIS — J208 Acute bronchitis due to other specified organisms: Secondary | ICD-10-CM | POA: Diagnosis not present

## 2024-10-17 DIAGNOSIS — C50512 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 breast: Secondary | ICD-10-CM

## 2024-10-17 DIAGNOSIS — B9689 Other specified bacterial agents as the cause of diseases classified elsewhere: Secondary | ICD-10-CM

## 2024-10-17 LAB — POCT GLYCOSYLATED HEMOGLOBIN (HGB A1C): Hemoglobin A1C: 6.1 % — AB (ref 4.0–5.6)

## 2024-10-17 MED ORDER — PNEUMOCOCCAL 20-VAL CONJ VACC 0.5 ML IM SUSY: 0.5000 mL | PREFILLED_SYRINGE | INTRAMUSCULAR | 0 refills | Status: AC

## 2024-10-17 MED ORDER — TETANUS-DIPHTH-ACELL PERTUSSIS 5-2-15.5 LF-MCG/0.5 IM SUSP
0.5000 mL | Freq: Once | INTRAMUSCULAR | 0 refills | Status: AC
Start: 2024-10-17 — End: 2024-10-17

## 2024-10-17 MED ORDER — BUSPIRONE HCL 5 MG PO TABS: 5.0000 mg | ORAL_TABLET | Freq: Every day | ORAL | 2 refills | Status: AC

## 2024-10-17 MED ORDER — LEVOFLOXACIN 750 MG PO TABS: 750.0000 mg | ORAL_TABLET | Freq: Every day | ORAL | 0 refills | Status: AC

## 2024-10-17 NOTE — Addendum Note (Signed)
 Addended by: FERNAND SIGRID HERO on: 10/17/2024 08:40 AM   Modules accepted: Level of Service

## 2024-10-17 NOTE — Progress Notes (Signed)
 [facility] [address] [place], [place] [address]  Internal MEDICINE  Office Visit Note  Patient Name: Virginia Warner  [id number]  [id number]  Date of Service: 10/17/2024  Chief Complaint  Patient presents with   Gastroesophageal Reflux   Hyperlipidemia   Hypertension   Follow-up    HPI Virginia Warner presents for a follow-up visit for osteporosis, prediabetes, bronchitis, breast cancer, hypertension, anxiety,  Osteoporosis -- on fosamax  Prediabetes -- A1c is stable at 6.1 today Bronchitis -- she has a cough, SOB, chest tightness and cough.  Breast cancer -- still on letrozole  therapy.  Anxiety -- takes buspirone  daily Hypertension -- on diltiazem  and controlled.  Lipoma was removed from right upper arm and patient is healing well.     Current Medication: Outpatient Encounter Medications as of 10/17/2024  Medication Sig   levofloxacin (LEVAQUIN) 750 MG tablet Take 1 tablet (750 mg total) by mouth daily. Take with food   [EXPIRED] pneumococcal 20-valent conjugate vaccine (PREVNAR 20) 0.5 ML injection Inject 0.5 mLs into the muscle tomorrow at 10 am for 1 dose.   [EXPIRED] Tdap (ADACEL) 04-29-14.5 LF-MCG/0.5 injection Inject 0.5 mLs into the muscle once for 1 dose.   acetaminophen  (TYLENOL ) 650 MG CR tablet Take 1,300 mg by mouth every morning. And PRN   aspirin EC 81 MG tablet Take 81 mg by mouth every other day.   [EXPIRED] azithromycin  (ZITHROMAX ) 250 MG tablet Take 2 tablets on day 1, then 1 tablet daily on days 2 through 5   bismuth subsalicylate (PEPTO BISMOL) 262 MG chewable tablet Chew 524 mg by mouth as needed.   busPIRone  (BUSPAR ) 5 MG tablet Take 1 tablet (5 mg total) by mouth daily.   diltiazem  (CARDIZEM  CD) 120 MG 24 hr capsule Take 1 capsule (120 mg total) by mouth daily. (Patient taking differently: Take 120 mg by mouth every morning.)   fexofenadine (ALLEGRA) 180 MG tablet Take 180 mg by mouth every morning.   fluticasone  (FLONASE ) 50 MCG/ACT nasal spray  SPRAY TWICE INTO EACH NOSTRIL ONCE DAILY (Patient taking differently: 1 spray daily as needed for allergies.)   gabapentin  (NEURONTIN ) 100 MG capsule TAKE ONE CAPSULE BY MOUTH EVERY MORNING,ONE CAPSULE IN THE AFTERNOON, AND 3 CAPSULES AT BEDTIME. (Patient taking differently: 100 mg 3 (three) times daily. TAKE ONE CAPSULE BY MOUTH EVERY MORNING,ONE CAPSULE IN THE AFTERNOON, AND 3 CAPSULES AT BEDTIME.)   latanoprost  (XALATAN ) 0.005 % ophthalmic solution Place 1 drop into both eyes at bedtime.   lisinopril  (ZESTRIL ) 20 MG tablet TAKE ONE TABLET BY MOUTH EVERY MORNING AND ONE IN EVENING (Patient taking differently: Take 20 mg by mouth 2 (two) times daily. TAKE ONE TABLET BY MOUTH EVERY MORNING AND ONE IN EVENING)   rosuvastatin  (CRESTOR ) 5 MG tablet TAKE 1 TABLET BY MOUTH ONCE DAILY   traMADol  (ULTRAM ) 50 MG tablet Take 50 mg by mouth every 6 (six) hours as needed.   [DISCONTINUED] alendronate  (FOSAMAX ) 70 MG tablet TAKE 1 TABLET EVERY 7 DAYS WITH A FULL GLASS OF WATER ON AN EMPTY STOMACH DO NOT LIE DOWN FOR AT LEAST 30 MIN (Patient taking differently: 70 mg once a week. Wednesdays)   [DISCONTINUED] busPIRone  (BUSPAR ) 5 MG tablet TAKE 1 TABLET BY MOUTH ONCE DAILY   [DISCONTINUED] letrozole  (FEMARA ) 2.5 MG tablet TAKE 1 TABLET BY MOUTH DAILY   [DISCONTINUED] traMADol  (ULTRAM ) 50 MG tablet Take 1 tablet (50 mg total) by mouth every 6 (six) hours as needed for moderate pain (pain score 4-6) or severe pain (  pain score 7-10).   No facility-administered encounter medications on file as of 10/17/2024.    Surgical History: Past Surgical History:  Procedure Laterality Date   ABDOMINAL HYSTERECTOMY W/ PARTIAL VAGINACTOMY N/A    BREAST BIOPSY Left 06/12/2020   [facility]  Bx, Ribbon Clip, [facility]   BREAST LUMPECTOMY Left 07/04/2020   [facility] lumpectomy with radiation   BREAST LUMPECTOMY,RADIO FREQ LOCALIZER,AXILLARY SENTINEL LYMPH NODE BIOPSY Left 07/10/2020   Procedure: BREAST LUMPECTOMY,RADIO FREQ LOCALIZER,AXILLARY  SENTINEL LYMPH NODE BIOPSY;  Surgeon: Jordis Laneta FALCON, MD;  Location: [facility] ORS;  Service: General;  Laterality: Left;   COLONOSCOPY WITH PROPOFOL  N/A 10/18/2015   Procedure: COLONOSCOPY WITH PROPOFOL ;  Surgeon: Lamar ONEIDA Holmes, MD;  Location: [facility] ENDOSCOPY;  Service: Endoscopy;  Laterality: N/A;   COLONOSCOPY WITH PROPOFOL  N/A 08/19/2021   Procedure: COLONOSCOPY WITH PROPOFOL ;  Surgeon: Maryruth Ole ONEIDA, MD;  Location: [facility] ENDOSCOPY;  Service: Endoscopy;  Laterality: N/A;   ESOPHAGOGASTRODUODENOSCOPY N/A 04/04/2024   Procedure: EGD (ESOPHAGOGASTRODUODENOSCOPY);  Surgeon: Maryruth Ole ONEIDA, MD;  Location: [facility] ENDOSCOPY;  Service: Endoscopy;  Laterality: N/A;   ESOPHAGOGASTRODUODENOSCOPY N/A 05/03/2024   Procedure: EGD (ESOPHAGOGASTRODUODENOSCOPY);  Surgeon: Maryruth Ole ONEIDA, MD;  Location: [facility] ENDOSCOPY;  Service: Endoscopy;  Laterality: N/A;   ESOPHAGOGASTRODUODENOSCOPY (EGD) WITH PROPOFOL  N/A 03/07/2024   Procedure: ESOPHAGOGASTRODUODENOSCOPY (EGD) WITH PROPOFOL ;  Surgeon: Maryruth Ole ONEIDA, MD;  Location: [facility] ENDOSCOPY;  Service: Endoscopy;  Laterality: N/A;   EXCISION MASS UPPER EXTREMETIES Right 09/22/2024   Procedure: EXCISION MASS UPPER EXTREMITIES;  Surgeon: Jordis Laneta FALCON, MD;  Location: [facility] ORS;  Service: General;  Laterality: Right;   INGUINAL HERNIA REPAIR     age 1's   POLYPECTOMY      Medical History: Past Medical History:  Diagnosis Date   Adjustment insomnia    Allergy    Anxiety    Aortic atherosclerosis    Arthritis    Bacterial vaginitis    Breast cancer (HCC)    Congenital deformities of feet    Corns and callosities    Esophageal stricture    GERD (gastroesophageal reflux disease)    History of kidney stones    Hyperlipidemia    Hypertension    Hypokalemia 01/19/2021   Lipoma of right upper extremity    Lymphadenopathy    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)    Osteoporosis    Personal history of radiation  therapy    Polyp of colon    Pre-diabetes    Renal calculi    Seasonal allergies    Tachycardia     Family History: Family History  Problem Relation Age of Onset   Cancer Brother    Breast cancer Paternal Aunt     Social History   Socioeconomic History   Marital status: Divorced    Spouse name: Not on file   Number of children: Not on file   Years of education: Not on file   Highest education level: Not on file  Occupational History   Not on file  Tobacco Use   Smoking status: Former    Current packs/day: 0.00    Types: Cigarettes    Quit date: 11/2007    Years since quitting: 16.9    Passive exposure: Past   Smokeless tobacco: Never  Vaping Use   Vaping status: Never Used  Substance and Sexual Activity   Alcohol use: Not Currently    Alcohol/week: 0.0 standard drinks of alcohol   Drug use: No   Sexual activity: Not Currently  Other Topics Concern   Not on file  Social History Narrative   Not on file   Social Drivers of Health   Financial Resource Strain: Low Risk  (08/23/2024)   Received [facility]   Overall Financial Resource Strain (CARDIA)    Difficulty of Paying Living Expenses: Not hard at all  Food Insecurity: No Food Insecurity (08/23/2024)   Received from [facility]   Hunger Vital Sign    Within the past 12 months, you worried that your food would run out before you got the money to buy more.: Never true    Within the past 12 months, the food you bought just didn't last and you didn't have money to get more.: Never true  Transportation Needs: No Transportation Needs (08/23/2024)   Received from [facility] - Transportation    In the past 12 months, has lack of transportation kept you from medical appointments or from getting medications?: No    Lack of Transportation (Non-Medical): No  Physical Activity: Not on file  Stress: Not on file  Social Connections: Not on file   Intimate Partner Violence: Not on file      Review of Systems  Constitutional:  Positive for appetite change and fatigue. Negative for chills and fever.  HENT:  Positive for congestion, postnasal drip, rhinorrhea, sinus pressure, sinus pain, sneezing and sore throat.   Respiratory:  Positive for cough, chest tightness, shortness of breath and wheezing.   Cardiovascular: Negative.  Negative for chest pain and palpitations.  Gastrointestinal: Negative.   Genitourinary: Negative.   Musculoskeletal:  Positive for arthralgias.  Skin: Negative.   Psychiatric/Behavioral:  Negative for self-injury, sleep disturbance and suicidal ideas. The patient is nervous/anxious.     Vital Signs: BP 135/70 Comment: 142/74  Pulse 95   Temp (!) 95.6 F (35.3 C)   Resp 16   Ht 5' 4 (1.626 m)   Wt 136 lb (61.7 kg)   SpO2 96%   BMI 23.34 kg/m    Physical Exam Vitals reviewed.  Constitutional:      General: She is not in acute distress.    Appearance: Normal appearance. She is normal weight. She is not ill-appearing.  HENT:     Head: Normocephalic and atraumatic.  Eyes:     Pupils: Pupils are equal, round, and reactive to light.  Cardiovascular:     Rate and Rhythm: Normal rate and regular rhythm.  Pulmonary:     Effort: Pulmonary effort is normal. No respiratory distress.  Skin:    Capillary Refill: Capillary refill takes less than 2 seconds.  Neurological:     Mental Status: She is alert and oriented to person, place, and time.  Psychiatric:        Mood and Affect: Mood normal.        Behavior: Behavior normal.        Assessment/Plan: 1. Acute bacterial bronchitis (Primary) Levofloxacin prescribed, take until gone.  - levofloxacin (LEVAQUIN) 750 MG tablet; Take 1 tablet (750 mg total) by mouth daily. Take with food  Dispense: 7 tablet; Refill: 0  2. Prediabetes A1c is stable at 6.1 - POCT glycosylated hemoglobin (Hb A1C)  3. Essential hypertension Stable, continue  diltiazem  as prescribed.   4. Aortic atherosclerosis Continue rosuvastatin  as prescribed.   5. Malignant neoplasm of lower-outer quadrant of left breast of female, estrogen receptor positive (HCC) Still on aromatase inhibitor therapy with letrozole    6. Lipoma of right  upper extremity Lipoma was surgically removed and she is healing well.   7. Need for vaccination Wait until the current lung infection resolves before getting vaccinations.  - pneumococcal 20-valent conjugate vaccine (PREVNAR 20) 0.5 ML injection; Inject 0.5 mLs into the muscle tomorrow at 10 am for 1 dose.  Dispense: 0.5 mL; Refill: 0 - Tdap (ADACEL) 04-29-14.5 LF-MCG/0.5 injection; Inject 0.5 mLs into the muscle once for 1 dose.  Dispense: 0.5 mL; Refill: 0  8. GAD (generalized anxiety disorder) Continue buspirone  as prescribed.  - busPIRone  (BUSPAR ) 5 MG tablet; Take 1 tablet (5 mg total) by mouth daily.  Dispense: 30 tablet; Refill: 2   General Counseling: Virginia Warner verbalizes understanding of the findings of todays visit and agrees with plan of treatment. I have discussed any further diagnostic evaluation that may be needed or ordered today. We also reviewed her medications today. she has been encouraged to call the office with any questions or concerns that should arise related to todays visit.    Orders Placed This Encounter  Procedures   POCT glycosylated hemoglobin (Hb A1C)    Meds ordered this encounter  Medications   pneumococcal 20-valent conjugate vaccine (PREVNAR 20) 0.5 ML injection    Sig: Inject 0.5 mLs into the muscle tomorrow at 10 am for 1 dose.    Dispense:  0.5 mL    Refill:  0   Tdap (ADACEL) 04-29-14.5 LF-MCG/0.5 injection    Sig: Inject 0.5 mLs into the muscle once for 1 dose.    Dispense:  0.5 mL    Refill:  0    Due for tdap   busPIRone  (BUSPAR ) 5 MG tablet    Sig: Take 1 tablet (5 mg total) by mouth daily.    Dispense:  30 tablet    Refill:  2    FOR NEXT FILL   levofloxacin (LEVAQUIN)  750 MG tablet    Sig: Take 1 tablet (750 mg total) by mouth daily. Take with food    Dispense:  7 tablet    Refill:  0    Fill new script today    Return in about 3 weeks (around 11/07/2024) for F/U resolution of bronchitis .   Total time spent:30 Minutes Time spent includes review of chart, medications, test results, and follow up plan with the patient.    Controlled Substance Database was reviewed by me.  This patient was seen by Mardy Maxin, FNP-C in collaboration with Dr. Sigrid Bathe as a part of collaborative care agreement.   Genny Caulder R. Maxin, MSN, FNP-C Internal medicine

## 2024-10-19 ENCOUNTER — Other Ambulatory Visit: Payer: Self-pay | Admitting: Oncology

## 2024-10-20 ENCOUNTER — Ambulatory Visit: Payer: Self-pay | Admitting: Physician Assistant

## 2024-10-23 ENCOUNTER — Encounter: Payer: Self-pay | Admitting: Nurse Practitioner

## 2024-10-27 ENCOUNTER — Other Ambulatory Visit: Payer: Self-pay

## 2024-10-27 DIAGNOSIS — N200 Calculus of kidney: Secondary | ICD-10-CM

## 2024-11-03 ENCOUNTER — Ambulatory Visit
Admission: RE | Admit: 2024-11-03 | Discharge: 2024-11-03 | Disposition: A | Source: Ambulatory Visit | Attending: Physician Assistant

## 2024-11-03 ENCOUNTER — Ambulatory Visit (INDEPENDENT_AMBULATORY_CARE_PROVIDER_SITE_OTHER): Admitting: Physician Assistant

## 2024-11-03 ENCOUNTER — Ambulatory Visit
Admission: RE | Admit: 2024-11-03 | Discharge: 2024-11-03 | Disposition: A | Discharge status: Home / Self Care | Attending: Physician Assistant | Admitting: Physician Assistant

## 2024-11-03 VITALS — BP 158/77 | HR 99 | Ht 64.0 in | Wt 135.0 lb

## 2024-11-03 DIAGNOSIS — N200 Calculus of kidney: Secondary | ICD-10-CM

## 2024-11-03 DIAGNOSIS — R310 Gross hematuria: Secondary | ICD-10-CM

## 2024-11-03 LAB — MICROSCOPIC EXAMINATION

## 2024-11-03 LAB — URINALYSIS, COMPLETE
Bilirubin, UA: NEGATIVE
Glucose, UA: NEGATIVE
Ketones, UA: NEGATIVE
Leukocytes,UA: NEGATIVE
Nitrite, UA: NEGATIVE
Protein,UA: NEGATIVE
Specific Gravity, UA: <1.005 — ABNORMAL LOW (ref 1.005–1.030)
Urobilinogen, Ur: 0.2 mg/dL (ref 0.2–1.0)
pH, UA: 6 (ref 5.0–7.5)

## 2024-11-03 NOTE — Patient Instructions (Signed)
 [email or web address]

## 2024-11-03 NOTE — Progress Notes (Signed)
 11/03/2024 9:49 AM   Virginia Warner 06-15-1946 [id number]  CC: Chief Complaint  Patient presents with   Follow-up   Hematuria   HPI: Virginia Warner is a 78 y.o. female with PMH gross hematuria with benign workup in 2022 and nonobstructing left renal stones who presents today for annual follow-up.   Today she reports she spontaneously passed a small stone 3 to 4 months ago and has had brief gross hematuria with this.  She has been asymptomatic since and has no acute concerns today.  KUB today with stable nonobstructing left renal stones.  UA with trace intact blood; urine microscopy pan negative.  PMH: Past Medical History:  Diagnosis Date   Adjustment insomnia    Allergy    Anxiety    Aortic atherosclerosis    Arthritis    Bacterial vaginitis    Breast cancer (HCC)    Congenital deformities of feet    Corns and callosities    Esophageal stricture    GERD (gastroesophageal reflux disease)    History of kidney stones    Hyperlipidemia    Hypertension    Hypokalemia 01/19/2021   Lipoma of right upper extremity    Lymphadenopathy    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)    Osteoporosis    Personal history of radiation therapy    Polyp of colon    Pre-diabetes    Renal calculi    Seasonal allergies    Tachycardia     Surgical History: Past Surgical History:  Procedure Laterality Date   ABDOMINAL HYSTERECTOMY W/ PARTIAL VAGINACTOMY N/A    BREAST BIOPSY Left 06/12/2020   [facility]x, Ribbon Clip, [facility]   BREAST LUMPECTOMY Left 07/04/2020   [facility] lumpectomy with radiation   BREAST LUMPECTOMY,RADIO FREQ LOCALIZER,AXILLARY SENTINEL LYMPH NODE BIOPSY Left 07/10/2020   Procedure: BREAST LUMPECTOMY,RADIO FREQ LOCALIZER,AXILLARY SENTINEL LYMPH NODE BIOPSY;  Surgeon: Jordis Laneta FALCON, MD;  Location: [facility] ORS;  Service: General;  Laterality: Left;   COLONOSCOPY WITH PROPOFOL  N/A 10/18/2015   Procedure: COLONOSCOPY WITH PROPOFOL ;  Surgeon: Lamar ONEIDA Holmes, MD;  Location: [facility] ENDOSCOPY;  Service: Endoscopy;  Laterality: N/A;   COLONOSCOPY WITH PROPOFOL  N/A 08/19/2021   Procedure: COLONOSCOPY WITH PROPOFOL ;  Surgeon: Maryruth Ole ONEIDA, MD;  Location: [facility] ENDOSCOPY;  Service: Endoscopy;  Laterality: N/A;   ESOPHAGOGASTRODUODENOSCOPY N/A 04/04/2024   Procedure: EGD (ESOPHAGOGASTRODUODENOSCOPY);  Surgeon: Maryruth Ole ONEIDA, MD;  Location: [facility] ENDOSCOPY;  Service: Endoscopy;  Laterality: N/A;   ESOPHAGOGASTRODUODENOSCOPY N/A 05/03/2024   Procedure: EGD (ESOPHAGOGASTRODUODENOSCOPY);  Surgeon: Maryruth Ole ONEIDA, MD;  Location: [facility] ENDOSCOPY;  Service: Endoscopy;  Laterality: N/A;   ESOPHAGOGASTRODUODENOSCOPY (EGD) WITH PROPOFOL  N/A 03/07/2024   Procedure: ESOPHAGOGASTRODUODENOSCOPY (EGD) WITH PROPOFOL ;  Surgeon: Maryruth Ole ONEIDA, MD;  Location: [facility] ENDOSCOPY;  Service: Endoscopy;  Laterality: N/A;   EXCISION MASS UPPER EXTREMETIES Right 09/22/2024   Procedure: EXCISION MASS UPPER EXTREMITIES;  Surgeon: Jordis Laneta FALCON, MD;  Location: [facility] ORS;  Service: General;  Laterality: Right;   INGUINAL HERNIA REPAIR     age 67's   POLYPECTOMY      Home Medications:  Allergies as of 11/03/2024       Reactions   Aspirin Nausea And Vomiting   Cannot tolerate unless enteric coated   Atorvastatin Other (See Comments)   Bone pain        Medication List        Accurate as of November 03, 2024  9:49 AM. If you have any  questions, ask your nurse or doctor.          acetaminophen  650 MG CR tablet Commonly known as: TYLENOL  Take 1,300 mg by mouth every morning. And PRN   alendronate  70 MG tablet Commonly known as: FOSAMAX  TAKE 1 TABLET EVERY 7 DAYS WITH A FULL GLASS OF WATER ON AN EMPTY STOMACH DO NOT LIE DOWN FOR AT LEAST 30 MIN What changed: See the new instructions.   aspirin EC 81 MG tablet Take 81 mg by mouth every other day.   bismuth subsalicylate 262 MG chewable tablet Commonly known as: PEPTO BISMOL Chew 524 mg by  mouth as needed.   busPIRone  5 MG tablet Commonly known as: BUSPAR  Take 1 tablet (5 mg total) by mouth daily.   diltiazem  120 MG 24 hr capsule Commonly known as: CARDIZEM  CD Take 1 capsule (120 mg total) by mouth daily. What changed: when to take this   fexofenadine 180 MG tablet Commonly known as: ALLEGRA Take 180 mg by mouth every morning.   fluticasone  50 MCG/ACT nasal spray Commonly known as: FLONASE  SPRAY TWICE INTO EACH NOSTRIL ONCE DAILY What changed: See the new instructions.   gabapentin  100 MG capsule Commonly known as: NEURONTIN  TAKE ONE CAPSULE BY MOUTH EVERY MORNING,ONE CAPSULE IN THE AFTERNOON, AND 3 CAPSULES AT BEDTIME. What changed: See the new instructions.   latanoprost  0.005 % ophthalmic solution Commonly known as: XALATAN  Place 1 drop into both eyes at bedtime.   letrozole  2.5 MG tablet Commonly known as: FEMARA  TAKE 1 TABLET BY MOUTH DAILY   levofloxacin 750 MG tablet Commonly known as: Levaquin Take 1 tablet (750 mg total) by mouth daily. Take with food   lisinopril  20 MG tablet Commonly known as: ZESTRIL  TAKE ONE TABLET BY MOUTH EVERY MORNING AND ONE IN EVENING What changed: See the new instructions.   rosuvastatin  5 MG tablet Commonly known as: CRESTOR  TAKE 1 TABLET BY MOUTH ONCE DAILY   traMADol  50 MG tablet Commonly known as: ULTRAM  Take 50 mg by mouth every 6 (six) hours as needed.        Allergies:  Allergies  Allergen Reactions   Aspirin Nausea And Vomiting    Cannot tolerate unless enteric coated   Atorvastatin Other (See Comments)    Bone pain    Family History: Family History  Problem Relation Age of Onset   Cancer Brother    Breast cancer Paternal Aunt     Social History:   reports that she quit smoking about 16 years ago. Her smoking use included cigarettes. She has been exposed to tobacco smoke. She has never used smokeless tobacco. She reports that she does not currently use alcohol. She reports that she does  not use drugs.  Physical Exam: BP (!) 158/77 (BP Location: Left Arm, Patient Position: Sitting, Cuff Size: Normal)   Pulse 99   Ht 5' 4 (1.626 m)   Wt 135 lb (61.2 kg)   SpO2 95%   BMI 23.17 kg/m   Constitutional:  Alert and oriented, no acute distress, nontoxic appearing HEENT: [place], AT Cardiovascular: No clubbing, cyanosis, or edema Respiratory: Normal respiratory effort, no increased work of breathing Skin: No rashes, bruises or suspicious lesions Neurologic: Grossly intact, no focal deficits, moving all 4 extremities Psychiatric: Normal mood and affect  Laboratory Data: Results for orders placed or performed in visit on 11/03/24  Microscopic Examination   Collection Time: 11/03/24  8:58 AM   Urine  Result Value Ref Range   WBC, UA 0-5 0 -  5 /hpf   RBC, Urine 0-2 0 - 2 /hpf   Epithelial Cells (non renal) 0-10 0 - 10 /hpf   Bacteria, UA Few None seen/Few  Urinalysis, Complete   Collection Time: 11/03/24  8:58 AM  Result Value Ref Range   Specific Gravity, UA <1.005 (L) 1.005 - 1.030   pH, UA 6.0 5.0 - 7.5   Color, UA Yellow Yellow   Appearance Ur Clear Clear   Leukocytes,UA Negative Negative   Protein,UA Negative Negative/Trace   Glucose, UA Negative Negative   Ketones, UA Negative Negative   RBC, UA Trace (A) Negative   Bilirubin, UA Negative Negative   Urobilinogen, Ur 0.2 0.2 - 1.0 mg/dL   Nitrite, UA Negative Negative   Microscopic Examination See below:    Pertinent Imaging: KUB, 11/03/2024: See Epic  I personally reviewed the images referenced above and note stable left renal stones.  Assessment & Plan:   1. Left renal stone (Primary) Stable, asymptomatic. Will continue to monitor. - Urinalysis, Complete  2. Gross hematuria Brief episode earlier this year associated with stone passage. UA bland today. Will continue to monitor. - Urinalysis, Complete   Return in about 1 year (around 11/03/2025) for Annual stone visit with KUB prior + UA.  Lucie Hones, PA-C  [facility] [place] [address] [place], [place] [address] [phone]

## 2024-11-07 ENCOUNTER — Ambulatory Visit (INDEPENDENT_AMBULATORY_CARE_PROVIDER_SITE_OTHER): Admitting: Nurse Practitioner

## 2024-11-07 ENCOUNTER — Encounter: Payer: Self-pay | Admitting: Nurse Practitioner

## 2024-11-07 ENCOUNTER — Other Ambulatory Visit: Payer: Self-pay | Admitting: Nurse Practitioner

## 2024-11-07 VITALS — BP 135/83 | HR 78 | Temp 95.0°F | Resp 16 | Ht 64.0 in | Wt 135.2 lb

## 2024-11-07 DIAGNOSIS — I700 Atherosclerosis of aorta: Secondary | ICD-10-CM

## 2024-11-07 DIAGNOSIS — M810 Age-related osteoporosis without current pathological fracture: Secondary | ICD-10-CM

## 2024-11-07 DIAGNOSIS — I10 Essential (primary) hypertension: Secondary | ICD-10-CM

## 2024-11-07 DIAGNOSIS — R7303 Prediabetes: Secondary | ICD-10-CM

## 2024-11-07 DIAGNOSIS — B9689 Other specified bacterial agents as the cause of diseases classified elsewhere: Secondary | ICD-10-CM

## 2024-11-07 DIAGNOSIS — J208 Acute bronchitis due to other specified organisms: Secondary | ICD-10-CM

## 2024-11-07 NOTE — Progress Notes (Signed)
 [facility] [address] [place], [place] [address]  Internal MEDICINE  Office Visit Note  Patient Name: Virginia Warner  [id number]  [id number]  Date of Service: 11/07/2024  Chief Complaint  Patient presents with   Gastroesophageal Reflux   Hypertension   Hyperlipidemia   Follow-up    HPI Virginia Warner presents for a follow-up visit for recent respiratory infection, osteoporosis, hypertension, high cholesterol, anxiety, and breast cancer.  Recent bronchitis infection -- has resolved.  Osteoporosis -- 5 year treatment course will be completed in June next year with alendronate  Hypertension -- controlled with diltiazem  and lisinopril   High cholesterol --taking rosuvastatin  5 mg daily Breast cancer, still on letrozole  therapy.  Anxiety -- doing well, taking buspirone  daily.     Current Medication: Outpatient Encounter Medications as of 11/07/2024  Medication Sig   acetaminophen  (TYLENOL ) 650 MG CR tablet Take 1,300 mg by mouth every morning. And PRN   alendronate  (FOSAMAX ) 70 MG tablet TAKE 1 TABLET EVERY 7 DAYS WITH A FULL GLASS OF WATER ON AN EMPTY STOMACH DO NOT LIE DOWN FOR AT LEAST 30 MIN   aspirin EC 81 MG tablet Take 81 mg by mouth every other day.   bismuth subsalicylate (PEPTO BISMOL) 262 MG chewable tablet Chew 524 mg by mouth as needed.   busPIRone  (BUSPAR ) 5 MG tablet Take 1 tablet (5 mg total) by mouth daily.   diltiazem  (CARDIZEM  CD) 120 MG 24 hr capsule Take 1 capsule (120 mg total) by mouth daily. (Patient taking differently: Take 120 mg by mouth every morning.)   fexofenadine (ALLEGRA) 180 MG tablet Take 180 mg by mouth every morning.   fluticasone  (FLONASE ) 50 MCG/ACT nasal spray SPRAY TWICE INTO EACH NOSTRIL ONCE DAILY (Patient taking differently: 1 spray daily as needed for allergies.)   gabapentin  (NEURONTIN ) 100 MG capsule TAKE ONE CAPSULE BY MOUTH EVERY MORNING,ONE CAPSULE IN THE AFTERNOON, AND 3 CAPSULES AT BEDTIME. (Patient taking differently: 100 mg  3 (three) times daily. TAKE ONE CAPSULE BY MOUTH EVERY MORNING,ONE CAPSULE IN THE AFTERNOON, AND 3 CAPSULES AT BEDTIME.)   latanoprost  (XALATAN ) 0.005 % ophthalmic solution Place 1 drop into both eyes at bedtime.   letrozole  (FEMARA ) 2.5 MG tablet TAKE 1 TABLET BY MOUTH DAILY   levofloxacin  (LEVAQUIN ) 750 MG tablet Take 1 tablet (750 mg total) by mouth daily. Take with food   lisinopril  (ZESTRIL ) 20 MG tablet TAKE ONE TABLET BY MOUTH EVERY MORNING AND ONE IN EVENING (Patient taking differently: Take 20 mg by mouth 2 (two) times daily. TAKE ONE TABLET BY MOUTH EVERY MORNING AND ONE IN EVENING)   rosuvastatin  (CRESTOR ) 5 MG tablet TAKE 1 TABLET BY MOUTH ONCE DAILY   traMADol  (ULTRAM ) 50 MG tablet Take 50 mg by mouth every 6 (six) hours as needed.   No facility-administered encounter medications on file as of 11/07/2024.    Surgical History: Past Surgical History:  Procedure Laterality Date   ABDOMINAL HYSTERECTOMY W/ PARTIAL VAGINACTOMY N/A    BREAST BIOPSY Left 06/12/2020   US  Bx, [place], [facility]   BREAST LUMPECTOMY Left 07/04/2020   [facility] lumpectomy with radiation   BREAST LUMPECTOMY,RADIO FREQ LOCALIZER,AXILLARY SENTINEL LYMPH NODE BIOPSY Left 07/10/2020   Procedure: BREAST LUMPECTOMY,RADIO FREQ LOCALIZER,AXILLARY SENTINEL LYMPH NODE BIOPSY;  Surgeon: Jordis Laneta FALCON, MD;  Location: [facility] ORS;  Service: General;  Laterality: Left;   COLONOSCOPY WITH PROPOFOL  N/A 10/18/2015   Procedure: COLONOSCOPY WITH PROPOFOL ;  Surgeon: Lamar ONEIDA Holmes, MD;  Location: [facility] ENDOSCOPY;  Service: Endoscopy;  Laterality:  N/A;   COLONOSCOPY WITH PROPOFOL  N/A 08/19/2021   Procedure: COLONOSCOPY WITH PROPOFOL ;  Surgeon: Maryruth Ole DASEN, MD;  Location: [facility] ENDOSCOPY;  Service: Endoscopy;  Laterality: N/A;   ESOPHAGOGASTRODUODENOSCOPY N/A 04/04/2024   Procedure: EGD (ESOPHAGOGASTRODUODENOSCOPY);  Surgeon: Maryruth Ole DASEN, MD;  Location: [facility] ENDOSCOPY;  Service: Endoscopy;  Laterality: N/A;    ESOPHAGOGASTRODUODENOSCOPY N/A 05/03/2024   Procedure: EGD (ESOPHAGOGASTRODUODENOSCOPY);  Surgeon: Maryruth Ole DASEN, MD;  Location: [facility] ENDOSCOPY;  Service: Endoscopy;  Laterality: N/A;   ESOPHAGOGASTRODUODENOSCOPY (EGD) WITH PROPOFOL  N/A 03/07/2024   Procedure: ESOPHAGOGASTRODUODENOSCOPY (EGD) WITH PROPOFOL ;  Surgeon: Maryruth Ole DASEN, MD;  Location: [facility] ENDOSCOPY;  Service: Endoscopy;  Laterality: N/A;   EXCISION MASS UPPER EXTREMETIES Right 09/22/2024   Procedure: EXCISION MASS UPPER EXTREMITIES;  Surgeon: Jordis Laneta FALCON, MD;  Location: [facility] ORS;  Service: General;  Laterality: Right;   INGUINAL HERNIA REPAIR     age 68's   POLYPECTOMY      Medical History: Past Medical History:  Diagnosis Date   Adjustment insomnia    Allergy    Anxiety    Aortic atherosclerosis    Arthritis    Bacterial vaginitis    Breast cancer (HCC)    Congenital deformities of feet    Corns and callosities    Esophageal stricture    GERD (gastroesophageal reflux disease)    History of kidney stones    Hyperlipidemia    Hypertension    Hypokalemia 01/19/2021   Lipoma of right upper extremity    Lymphadenopathy    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)    Osteoporosis    Personal history of radiation therapy    Polyp of colon    Pre-diabetes    Renal calculi    Seasonal allergies    Tachycardia     Family History: Family History  Problem Relation Age of Onset   Cancer Brother    Breast cancer Paternal Aunt     Social History   Socioeconomic History   Marital status: Divorced    Spouse name: Not on file   Number of children: Not on file   Years of education: Not on file   Highest education level: Not on file  Occupational History   Not on file  Tobacco Use   Smoking status: Former    Current packs/day: 0.00    Types: Cigarettes    Quit date: 11/2007    Years since quitting: 17.0    Passive exposure: Past   Smokeless tobacco: Never  Vaping  Use   Vaping status: Never Used  Substance and Sexual Activity   Alcohol use: Not Currently    Alcohol/week: 0.0 standard drinks of alcohol   Drug use: No   Sexual activity: Not Currently  Other Topics Concern   Not on file  Social History Narrative   Not on file   Social Drivers of Health   Financial Resource Strain: Low Risk  (08/23/2024)   Received from [facility]   Overall Financial Resource Strain (CARDIA)    Difficulty of Paying Living Expenses: Not hard at all  Food Insecurity: No Food Insecurity (08/23/2024)   Received from [facility]   Hunger Vital Sign    Within the past 12 months, you worried that your food would run out before you got the money to buy more.: Never true    Within the past 12 months, the food you bought just didn't last and you didn't have money to  get more.: Never true  Transportation Needs: No Transportation Needs (08/23/2024)   Received from [facility] - Transportation    In the past 12 months, has lack of transportation kept you from medical appointments or from getting medications?: No    Lack of Transportation (Non-Medical): No  Physical Activity: Not on file  Stress: Not on file  Social Connections: Not on file  Intimate Partner Violence: Not on file      Review of Systems  Constitutional:  Negative for appetite change, fatigue and fever.  HENT:  Negative for congestion, mouth sores and postnasal drip.   Respiratory: Negative.  Negative for cough, chest tightness and shortness of breath.   Cardiovascular: Negative.  Negative for chest pain and palpitations.  Gastrointestinal: Negative.   Genitourinary: Negative.  Negative for flank pain.  Musculoskeletal:  Positive for arthralgias.  Neurological: Negative.  Negative for headaches.  Psychiatric/Behavioral:  Negative for behavioral problems, self-injury, sleep disturbance and suicidal ideas. The patient is nervous/anxious.      Vital Signs: BP 135/83   Pulse 78   Temp (!) 95 F (35 C)   Resp 16   Ht 5' 4 (1.626 m)   Wt 135 lb 3.2 oz (61.3 kg)   SpO2 96%   BMI 23.21 kg/m    Physical Exam Vitals reviewed.  Constitutional:      General: She is not in acute distress.    Appearance: Normal appearance. She is normal weight. She is not ill-appearing.  HENT:     Head: Normocephalic and atraumatic.  Eyes:     Pupils: Pupils are equal, round, and reactive to light.  Cardiovascular:     Rate and Rhythm: Normal rate and regular rhythm.  Pulmonary:     Effort: Pulmonary effort is normal. No respiratory distress.  Neurological:     Mental Status: She is alert and oriented to person, place, and time.  Psychiatric:        Mood and Affect: Mood normal.        Behavior: Behavior normal.        Assessment/Plan: 1. Essential hypertension (Primary) Stable, continue diltiazem  and lisinopril  as prescribed.   2. Aortic atherosclerosis Continue rosuvastatin  as prescribed.   3. Prediabetes Continue low sugar, low carb diet and increase physical activity as tolerated.   4. Age-related osteoporosis without current pathological fracture Continue alendronate  until June 2026  5. Acute bacterial bronchitis Resolved    General Counseling: Anna verbalizes understanding of the findings of todays visit and agrees with plan of treatment. I have discussed any further diagnostic evaluation that may be needed or ordered today. We also reviewed her medications today. she has been encouraged to call the office with any questions or concerns that should arise related to todays visit.    No orders of the defined types were placed in this encounter.   No orders of the defined types were placed in this encounter.   Return for follow up at previously scheduled visit in june unless otherwise needed. .   Total time spent:30 Minutes Time spent includes review of chart, medications, test results, and follow up plan  with the patient.   [place] Controlled Substance Database was reviewed by me.  This patient was seen by Mardy Maxin, FNP-C in collaboration with Dr. Sigrid Bathe as a part of collaborative care agreement.   Maciah Feeback R. Maxin, MSN, FNP-C Internal medicine

## 2024-11-10 ENCOUNTER — Encounter: Payer: Self-pay | Admitting: Nurse Practitioner

## 2024-11-17 ENCOUNTER — Telehealth: Payer: Self-pay | Admitting: Nurse Practitioner

## 2024-11-17 NOTE — Telephone Encounter (Signed)
 Most recent office note discussing blood pressure control faxed to [facility]; [phone]

## 2024-12-05 ENCOUNTER — Encounter: Payer: Self-pay | Admitting: Nurse Practitioner

## 2025-01-02 ENCOUNTER — Encounter: Payer: Self-pay | Admitting: Physician Assistant

## 2025-01-02 ENCOUNTER — Ambulatory Visit (INDEPENDENT_AMBULATORY_CARE_PROVIDER_SITE_OTHER): Admitting: Physician Assistant

## 2025-01-02 ENCOUNTER — Other Ambulatory Visit: Payer: Self-pay | Admitting: Urology

## 2025-01-02 VITALS — BP 110/70 | HR 100 | Temp 98.0°F | Resp 16 | Ht 64.0 in | Wt 126.0 lb

## 2025-01-02 DIAGNOSIS — R300 Dysuria: Secondary | ICD-10-CM | POA: Diagnosis not present

## 2025-01-02 DIAGNOSIS — R319 Hematuria, unspecified: Secondary | ICD-10-CM | POA: Diagnosis not present

## 2025-01-02 DIAGNOSIS — N390 Urinary tract infection, site not specified: Secondary | ICD-10-CM

## 2025-01-02 DIAGNOSIS — R310 Gross hematuria: Secondary | ICD-10-CM

## 2025-01-02 DIAGNOSIS — N200 Calculus of kidney: Secondary | ICD-10-CM

## 2025-01-02 LAB — POCT URINALYSIS DIPSTICK
Bilirubin, UA: NEGATIVE
Glucose, UA: NEGATIVE
Ketones, UA: POSITIVE
Leukocytes, UA: NEGATIVE
Nitrite, UA: NEGATIVE
Protein, UA: POSITIVE — AB
Spec Grav, UA: 1.01
Urobilinogen, UA: 0.2 U/dL
pH, UA: 5

## 2025-01-02 NOTE — Progress Notes (Signed)
 " [facility] [address] [place], [place] [address]  Internal MEDICINE  Office Visit Note  Patient Name: Virginia Warner  [id number]  [id number]  Date of Service: 01/02/2025  Chief Complaint  Patient presents with   Acute Visit   Urinary Tract Infection     HPI Pt is here for a sick visit. -a little low back pain, but also was up on ladder cleaning and may have strained some -a little blood in the urine since yesterday -does have urologist who follows known kidney stones and she has periodic blood in urine with stones previously -tiny burning with urination, but not much  -normally has increased frequency, but no change recently -does drink a lot of water  Current Medication:  Outpatient Encounter Medications as of 01/02/2025  Medication Sig   acetaminophen  (TYLENOL ) 650 MG CR tablet Take 1,300 mg by mouth every morning. And PRN   alendronate  (FOSAMAX ) 70 MG tablet TAKE 1 TABLET EVERY 7 DAYS WITH A FULL GLASS OF WATER ON AN EMPTY STOMACH DO NOT LIE DOWN FOR AT LEAST 30 MIN   aspirin EC 81 MG tablet Take 81 mg by mouth every other day.   bismuth subsalicylate (PEPTO BISMOL) 262 MG chewable tablet Chew 524 mg by mouth as needed.   busPIRone  (BUSPAR ) 5 MG tablet Take 1 tablet (5 mg total) by mouth daily.   diltiazem  (CARDIZEM  CD) 120 MG 24 hr capsule Take 1 capsule (120 mg total) by mouth daily. (Patient taking differently: Take 120 mg by mouth every morning.)   fexofenadine (ALLEGRA) 180 MG tablet Take 180 mg by mouth every morning.   fluticasone  (FLONASE ) 50 MCG/ACT nasal spray SPRAY TWICE INTO EACH NOSTRIL ONCE DAILY (Patient taking differently: 1 spray daily as needed for allergies.)   gabapentin  (NEURONTIN ) 100 MG capsule TAKE ONE CAPSULE BY MOUTH EVERY MORNING,ONE CAPSULE IN THE AFTERNOON, AND 3 CAPSULES AT BEDTIME. (Patient taking differently: 100 mg 3 (three) times daily. TAKE ONE CAPSULE BY MOUTH EVERY MORNING,ONE CAPSULE IN THE AFTERNOON, AND 3 CAPSULES AT  BEDTIME.)   latanoprost (XALATAN) 0.005 % ophthalmic solution Place 1 drop into both eyes at bedtime.   letrozole  (FEMARA ) 2.5 MG tablet TAKE 1 TABLET BY MOUTH DAILY   levofloxacin  (LEVAQUIN ) 750 MG tablet Take 1 tablet (750 mg total) by mouth daily. Take with food   lisinopril  (ZESTRIL ) 20 MG tablet TAKE ONE TABLET BY MOUTH EVERY MORNING AND ONE IN EVENING (Patient taking differently: Take 20 mg by mouth 2 (two) times daily. TAKE ONE TABLET BY MOUTH EVERY MORNING AND ONE IN EVENING)   rosuvastatin  (CRESTOR ) 5 MG tablet TAKE 1 TABLET BY MOUTH ONCE DAILY   traMADol  (ULTRAM ) 50 MG tablet Take 50 mg by mouth every 6 (six) hours as needed.   No facility-administered encounter medications on file as of 01/02/2025.      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  HENT:  Negative for congestion, mouth sores and postnasal drip.   Respiratory:  Negative for cough.   Cardiovascular:  Negative for chest pain.  Genitourinary:  Positive for dysuria and hematuria. Negative for difficulty urinating and pelvic pain.  Musculoskeletal:  Positive for  back pain.  Psychiatric/Behavioral: Negative.      Physical Exam Vitals reviewed.  Constitutional:      General: She is not in acute distress.    Appearance: Normal appearance. She is normal weight. She is not ill-appearing.  HENT:     Head: Normocephalic and atraumatic.  Eyes:     Pupils: Pupils are equal, round, and reactive to light.  Cardiovascular:     Rate and Rhythm: Normal rate and regular rhythm.  Pulmonary:     Effort: Pulmonary effort is normal. No respiratory distress.  Abdominal:     Tenderness: There is no abdominal tenderness. There is no right CVA tenderness or left CVA tenderness.  Neurological:     Mental Status: She is alert and oriented to person, place, and time.  Psychiatric:        Mood and Affect: Mood normal.        Behavior: Behavior normal.       Assessment/Plan: 1. Nephrolithiasis Known stones followed by urology and may have passed another smaller stone causing blood. Will follow up with urology if new or worsening symptoms  2. Urinary tract infection with hematuria, site unspecified Will check culture to ensure no infection - CULTURE, URINE COMPREHENSIVE  3. Dysuria (Primary) - POCT Urinalysis Dipstick   General Counseling: [place] understanding of the findings of todays visit and agrees with plan of treatment. I have discussed any further diagnostic evaluation that may be needed or ordered today. We also reviewed her medications today. she has been encouraged to call the office with any questions or concerns that should arise related to todays visit.    Counseling:    No orders of the defined types were placed in this encounter.   No orders of the defined types were placed in this encounter.   Time spent:30 Minutes "

## 2025-01-02 NOTE — Progress Notes (Signed)
 "    01/03/2025 7:16 PM   Virginia Warner August 16, 1946 [id number]  Referring provider: Liana Fish, NP [address] [place],  [place] [address]  Urological history: 1. Nephrolithiasis - stone analysis 95% calcium  oxalate and 5% calcium  phosphate - spontaneous passage of stone  2. High risk hematuria - former smoking - CTU (2022) left nephrolithiasis - cysto (2022) NED - urine cytology (2022) NED  Chief Complaint  Patient presents with   Hematuria   HPI: Virginia Warner is a 79 y.o. woman who presents today for blood in the urine.   Previous records reviewed.  CT renal stone study (03/2024) multiple left renal stones.  She was seen by her PCP's office yesterday for low back pain, gross heme, and dysuria.  Urine dip with positive ketones, positive protein, and large heme.  Urine culture is pending.   She thinks she may have passed a stone on Sunday at church.  She states when she went to the restroom she saw a little black spot in the toilet.  Because she was at church she was not going to finish it out of the toilet.  It was after that when the gross hematuria started.  The urine has since cleared.  UA yellow clear, specific gravity less than 1.005, pH 6.0, 3+ heme, 0-5 WBCs, greater than 30 RBCs, 0-10 epithelial cells and few bacteria  KUB stable calculi overlying the left kidney, appear to be stable from prior CT   PMH: Past Medical History:  Diagnosis Date   Adjustment insomnia    Allergy    Anxiety    Aortic atherosclerosis    Arthritis    Bacterial vaginitis    Breast cancer (HCC)    Congenital deformities of feet    Corns and callosities    Esophageal stricture    GERD (gastroesophageal reflux disease)    History of kidney stones    Hyperlipidemia    Hypertension    Hypokalemia 01/19/2021   Lipoma of right upper extremity    Lymphadenopathy    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 lumpectomy with radiation   BREAST LUMPECTOMY,RADIO FREQ LOCALIZER,AXILLARY SENTINEL LYMPH NODE BIOPSY Left 07/10/2020   Procedure: BREAST LUMPECTOMY,RADIO FREQ LOCALIZER,AXILLARY SENTINEL LYMPH NODE BIOPSY;  Surgeon: Jordis Laneta FALCON, MD;  Location: [facility] ORS;  Service: General;  Laterality: Left;   COLONOSCOPY WITH PROPOFOL  N/A 10/18/2015   Procedure: COLONOSCOPY WITH PROPOFOL ;  Surgeon: Lamar ONEIDA Holmes, MD;  Location: [facility] ENDOSCOPY;  Service: Endoscopy;  Laterality: N/A;   COLONOSCOPY WITH PROPOFOL  N/A 08/19/2021   Procedure: COLONOSCOPY WITH PROPOFOL ;  Surgeon: Maryruth Ole ONEIDA, MD;  Location: [facility] ENDOSCOPY;  Service: Endoscopy;  Laterality: N/A;   ESOPHAGOGASTRODUODENOSCOPY N/A 04/04/2024   Procedure: EGD (ESOPHAGOGASTRODUODENOSCOPY);  Surgeon: Maryruth Ole ONEIDA, MD;  Location: [facility] ENDOSCOPY;  Service: Endoscopy;  Laterality: N/A;   ESOPHAGOGASTRODUODENOSCOPY N/A 05/03/2024   Procedure: EGD (ESOPHAGOGASTRODUODENOSCOPY);  Surgeon: Maryruth Ole ONEIDA, MD;  Location: [facility] ENDOSCOPY;  Service: Endoscopy;  Laterality: N/A;   ESOPHAGOGASTRODUODENOSCOPY (EGD) WITH PROPOFOL   N/A 03/07/2024   Procedure: ESOPHAGOGASTRODUODENOSCOPY (EGD) WITH PROPOFOL ;  Surgeon: Maryruth Ole DASEN, MD;  Location: [facility] ENDOSCOPY;  Service: Endoscopy;  Laterality: N/A;   EXCISION MASS UPPER EXTREMETIES Right 09/22/2024   Procedure: EXCISION MASS UPPER EXTREMITIES;  Surgeon: Jordis Laneta FALCON, MD;  Location: [facility] ORS;  Service: General;  Laterality: Right;   INGUINAL HERNIA REPAIR     age 15's   POLYPECTOMY      Home Medications:  Allergies as of 01/03/2025       Reactions    Aspirin Nausea And Vomiting   Cannot tolerate unless enteric coated   Atorvastatin Other (See Comments)   Bone pain        Medication List        Accurate as of January 03, 2025 11:59 PM. If you have any questions, ask your nurse or doctor.          acetaminophen  650 MG CR tablet Commonly known as: TYLENOL  Take 1,300 mg by mouth every morning. And PRN   alendronate  70 MG tablet Commonly known as: FOSAMAX  TAKE 1 TABLET EVERY 7 DAYS WITH A FULL GLASS OF WATER ON AN EMPTY STOMACH DO NOT LIE DOWN FOR AT LEAST 30 MIN   aspirin EC 81 MG tablet Take 81 mg by mouth every other day.   bismuth subsalicylate 262 MG chewable tablet Commonly known as: PEPTO BISMOL Chew 524 mg by mouth as needed.   busPIRone  5 MG tablet Commonly known as: BUSPAR  Take 1 tablet (5 mg total) by mouth daily.   diltiazem  120 MG 24 hr capsule Commonly known as: CARDIZEM  CD Take 1 capsule (120 mg total) by mouth daily. What changed: when to take this   fexofenadine 180 MG tablet Commonly known as: ALLEGRA Take 180 mg by mouth every morning.   fluticasone  50 MCG/ACT nasal spray Commonly known as: FLONASE  SPRAY TWICE INTO EACH NOSTRIL ONCE DAILY What changed: See the new instructions.   gabapentin  100 MG capsule Commonly known as: NEURONTIN  TAKE ONE CAPSULE BY MOUTH EVERY MORNING,ONE CAPSULE IN THE AFTERNOON, AND 3 CAPSULES AT BEDTIME. What changed: See the new instructions.   latanoprost 0.005 % ophthalmic solution Commonly known as: XALATAN Place 1 drop into both eyes at bedtime.   letrozole  2.5 MG tablet Commonly known as: FEMARA  TAKE 1 TABLET BY MOUTH DAILY   levofloxacin  750 MG tablet Commonly known as: Levaquin  Take 1 tablet (750 mg total) by mouth daily. Take with food   lisinopril  20 MG tablet Commonly known as: ZESTRIL  TAKE ONE TABLET BY MOUTH EVERY MORNING AND ONE IN EVENING What changed: See the new instructions.   rosuvastatin  5 MG tablet Commonly known as: CRESTOR  TAKE  1 TABLET BY MOUTH ONCE DAILY   traMADol  50 MG tablet Commonly known as: ULTRAM  Take 50 mg by mouth every 6 (six) hours as needed.        Allergies: Allergies[1]  Family History: Family History  Problem Relation Age of Onset   Cancer Brother    Breast cancer Paternal Aunt     Social History:  reports that she quit smoking about 17 years ago. Her smoking use included cigarettes. She has been exposed to tobacco smoke. She has never used smokeless tobacco. She reports that she does not currently use alcohol. She reports that she does not use drugs.  ROS: Pertinent ROS in HPI  Physical Exam: BP (!) 148/76   Pulse 95   Wt 139 lb (63 kg)   SpO2 95%   BMI 23.86  kg/m    Laboratory Data: See Epic and HPI   I have reviewed the labs.  Physical Exam: Blood pressure (!) 148/76, pulse 95, weight 139 lb (63 kg), SpO2 95%.  Constitutional:  Well nourished. Alert and oriented, No acute distress. HEENT: [place] AT, moist mucus membranes.  Trachea midline Cardiovascular: No clubbing, cyanosis, or edema. Respiratory: Normal respiratory effort, no increased work of breathing. Neurologic: Grossly intact, no focal deficits, moving all 4 extremities. Psychiatric: Normal mood and affect.    Pertinent Imaging: KUB, radiologist interpretation still pending  I have independently reviewed the films.  See HPI.   Assessment & Plan:    1. Gross hematuria - UA w/ > 30 RBC's  - Possibly secondary to the passage of a stone - I did discuss that we may need to repeat hematuria workup, but she was very hesitant stating she did not want to go through a cystoscopy again - We are currently waiting on the urine culture results from her other office - If urine culture is negative, we will need to pursue repeat cystoscopy, if urine culture is positive, we need to treat with culture appropriate antibiotics and have her return in 2 months for repeat UA to ensure that the hematuria has resolved with the  treatment of the infection  2. Nephrolithiasis - recent CT with multiple left renal stones - no renal colic - continue yearly Xrays  Return for Follow up pending labs.  These notes generated with voice recognition software. I apologize for typographical errors.  Virginia Warner  [facility] [address]  [address] [place], [place] [address] [phone]     [1]  Allergies Allergen Reactions   Aspirin Nausea And Vomiting    Cannot tolerate unless enteric coated   Atorvastatin Other (See Comments)    Bone pain   "

## 2025-01-03 ENCOUNTER — Ambulatory Visit
Admission: RE | Admit: 2025-01-03 | Discharge: 2025-01-03 | Disposition: A | Source: Ambulatory Visit | Attending: Urology | Admitting: Urology

## 2025-01-03 ENCOUNTER — Ambulatory Visit
Admission: RE | Admit: 2025-01-03 | Discharge: 2025-01-03 | Disposition: A | Discharge status: Home / Self Care | Source: Ambulatory Visit | Attending: Urology | Admitting: Urology

## 2025-01-03 ENCOUNTER — Ambulatory Visit: Admitting: Urology

## 2025-01-03 VITALS — BP 148/76 | HR 95 | Wt 139.0 lb

## 2025-01-03 DIAGNOSIS — N200 Calculus of kidney: Secondary | ICD-10-CM | POA: Insufficient documentation

## 2025-01-03 DIAGNOSIS — R310 Gross hematuria: Secondary | ICD-10-CM | POA: Diagnosis present

## 2025-01-03 LAB — URINALYSIS, COMPLETE
Bilirubin, UA: NEGATIVE
Glucose, UA: NEGATIVE
Ketones, UA: NEGATIVE
Leukocytes,UA: NEGATIVE
Nitrite, UA: NEGATIVE
Protein,UA: NEGATIVE
Specific Gravity, UA: <1.005 — ABNORMAL LOW (ref 1.005–1.030)
Urobilinogen, Ur: 0.2 mg/dL (ref 0.2–1.0)
pH, UA: 6 (ref 5.0–7.5)

## 2025-01-03 LAB — MICROSCOPIC EXAMINATION: RBC, Urine: >30 /HPF — AB (ref 0–2)

## 2025-01-06 LAB — CULTURE, URINE COMPREHENSIVE

## 2025-01-08 ENCOUNTER — Encounter: Payer: Self-pay | Admitting: Urology

## 2025-01-08 ENCOUNTER — Telehealth: Payer: Self-pay | Admitting: Urology

## 2025-01-08 DIAGNOSIS — A498 Other bacterial infections of unspecified site: Secondary | ICD-10-CM

## 2025-01-08 NOTE — Telephone Encounter (Signed)
 Please let Virginia Warner know that her urine culture was positive for infection and we need for her to start Augmentin  875/125 mg twice daily for seven days.  I will need to see her back in two months to recheck her urine and her symptoms.  If she continues to have blood in her urine, we will need to repeat the cystoscopy to make sure there is nothing serious going on in her bladder.

## 2025-01-09 MED ORDER — AMOXICILLIN-POT CLAVULANATE 875-125 MG PO TABS: 1.0000 | ORAL_TABLET | Freq: Two times a day (BID) | ORAL | 0 refills | Status: AC

## 2025-01-09 NOTE — Telephone Encounter (Signed)
 Pt called triage line and states she has seen her urine cx results on mychart. Advised pt of the information below per Clotilda Cornwall, PA. RX sent. Appt made.

## 2025-01-10 ENCOUNTER — Ambulatory Visit: Payer: Self-pay | Admitting: Physician Assistant

## 2025-01-11 ENCOUNTER — Other Ambulatory Visit: Payer: Self-pay | Admitting: Internal Medicine

## 2025-01-11 DIAGNOSIS — G5793 Unspecified mononeuropathy of bilateral lower limbs: Secondary | ICD-10-CM

## 2025-01-12 ENCOUNTER — Telehealth: Payer: Self-pay | Admitting: *Deleted

## 2025-01-12 NOTE — Telephone Encounter (Signed)
 Patient called in today and states she is having stomach cramp after taking the AUGMENTIN  . She states she still wants to take the medication so she can get it done. She don't want to start on another unless she has to. After she poops it gets better. She states she is going to take some gas x also. She will call us  back if that don't work per patient. I tried to advise her if the medication is making her have stomach cramp she need to stop taking it. She states she is going to keep taking the medication until she can't.

## 2025-01-19 ENCOUNTER — Other Ambulatory Visit: Payer: Self-pay | Admitting: Oncology

## 2025-01-19 DIAGNOSIS — Z79811 Long term (current) use of aromatase inhibitors: Secondary | ICD-10-CM

## 2025-02-01 ENCOUNTER — Encounter: Payer: Self-pay | Admitting: Urology

## 2025-02-01 ENCOUNTER — Ambulatory Visit: Admitting: Urology

## 2025-02-01 VITALS — BP 135/80 | HR 98 | Ht 65.0 in | Wt 135.0 lb

## 2025-02-01 DIAGNOSIS — R310 Gross hematuria: Secondary | ICD-10-CM | POA: Diagnosis not present

## 2025-02-01 DIAGNOSIS — N200 Calculus of kidney: Secondary | ICD-10-CM

## 2025-02-01 NOTE — Patient Instructions (Signed)
Kidney stones can form due to one or all of the following:   1. Low fluid Intake  2. Low citrate in the urine  3. Too much salt, oxalate, and animal protein   The following dietary changes may decrease your risk of forming stones.   1. Increase your fluid intake to around 2.5 - 3 liters per day. Diluting the urine hinders the formation of stones. You should drink enough fluid to make 2 liters of urine a day. You know you are drinking enough fluid if your urine is clear.   2. Decrease your salt intake to less than 2000mg/day  This will decrease the amount of calcium excreted in your urine. Eat more fresh or frozen vegetables instead of canned. Eat less processed meats. At restaurants request your food to be prepared without salt or high sodium seasoning.   3. Limit the amount of Oxalate containing foods in your diet to 50mg per day.  This will decrease the amount of oxalate excreted in your urine.   Spinach  Potato Nuts  Peanut Butter  Chocolate   4. Limit the amount of animal proteins in your diet to approximately the size of 1 deck of cards per meal This will decrease the amount of uric acid excreted in your urine.   Fish  Liver  Chicken  Red Meat - no more than 3 servings per week   5. Increase the amount of citrate in your diet. Most fruits and vegetables are high in citrate. Increasing the amount of fruit and vegetable your diet will raise your citrate.  Lemons and limes have the highest amount of citrate. A recipe for lemonade may be helpful to increase citrate in your diet: 1/2 cup concentrated lemon juice to 2 quarts of water. Sweeten to taste.  

## 2025-02-02 LAB — URINALYSIS, COMPLETE
Bilirubin, UA: NEGATIVE
Glucose, UA: NEGATIVE
Ketones, UA: NEGATIVE
Leukocytes,UA: NEGATIVE
Nitrite, UA: NEGATIVE
Protein,UA: NEGATIVE
Specific Gravity, UA: <1.005 — ABNORMAL LOW (ref 1.005–1.030)
Urobilinogen, Ur: 0.2 mg/dL (ref 0.2–1.0)
pH, UA: 6 (ref 5.0–7.5)

## 2025-02-02 LAB — MICROSCOPIC EXAMINATION: Bacteria, UA: NONE SEEN

## 2025-03-09 ENCOUNTER — Ambulatory Visit: Admitting: Urology

## 2025-04-19 ENCOUNTER — Inpatient Hospital Stay: Admitting: Oncology

## 2025-04-21 ENCOUNTER — Inpatient Hospital Stay: Admitting: Oncology

## 2025-06-15 ENCOUNTER — Ambulatory Visit: Admitting: Nurse Practitioner

## 2025-11-03 ENCOUNTER — Ambulatory Visit: Admitting: Physician Assistant
